# Patient Record
Sex: Female | Born: 1940
Health system: Southern US, Community
[De-identification: ages and names within clinical notes are randomized; demographics above are authoritative.]

## PROBLEM LIST (undated history)

## (undated) DIAGNOSIS — D649 Anemia, unspecified: Secondary | ICD-10-CM

## (undated) DIAGNOSIS — I5032 Chronic diastolic (congestive) heart failure: Secondary | ICD-10-CM

## (undated) DIAGNOSIS — R569 Unspecified convulsions: Secondary | ICD-10-CM

## (undated) DIAGNOSIS — E039 Hypothyroidism, unspecified: Secondary | ICD-10-CM

## (undated) DIAGNOSIS — E119 Type 2 diabetes mellitus without complications: Secondary | ICD-10-CM

## (undated) DIAGNOSIS — E876 Hypokalemia: Secondary | ICD-10-CM

## (undated) DIAGNOSIS — E7800 Pure hypercholesterolemia, unspecified: Secondary | ICD-10-CM

## (undated) DIAGNOSIS — I10 Essential (primary) hypertension: Secondary | ICD-10-CM

## (undated) DIAGNOSIS — E669 Obesity, unspecified: Secondary | ICD-10-CM

## (undated) DIAGNOSIS — K219 Gastro-esophageal reflux disease without esophagitis: Secondary | ICD-10-CM

## (undated) DIAGNOSIS — I209 Angina pectoris, unspecified: Secondary | ICD-10-CM

## (undated) DIAGNOSIS — I219 Acute myocardial infarction, unspecified: Secondary | ICD-10-CM

## (undated) DIAGNOSIS — I2510 Atherosclerotic heart disease of native coronary artery without angina pectoris: Secondary | ICD-10-CM

## (undated) DIAGNOSIS — M1990 Unspecified osteoarthritis, unspecified site: Secondary | ICD-10-CM

## (undated) DIAGNOSIS — D573 Sickle-cell trait: Secondary | ICD-10-CM

## (undated) DIAGNOSIS — I447 Left bundle-branch block, unspecified: Secondary | ICD-10-CM

## (undated) HISTORY — DX: Left bundle-branch block, unspecified: I44.7

## (undated) HISTORY — PX: CARDIAC CATHETERIZATION: SHX172

## (undated) HISTORY — DX: Gastro-esophageal reflux disease without esophagitis: K21.9

## (undated) HISTORY — PX: CATARACT EXTRACTION W/ INTRAOCULAR LENS  IMPLANT, BILATERAL: SHX1307

## (undated) HISTORY — PX: CHOLECYSTECTOMY: SHX55

## (undated) HISTORY — DX: Obesity, unspecified: E66.9

## (undated) HISTORY — DX: Hypothyroidism, unspecified: E03.9

## (undated) HISTORY — DX: Essential (primary) hypertension: I10

## (undated) HISTORY — DX: Atherosclerotic heart disease of native coronary artery without angina pectoris: I25.10

## (undated) HISTORY — PX: CORONARY ANGIOPLASTY WITH STENT PLACEMENT: SHX49

---

## 1967-09-28 HISTORY — PX: TUBAL LIGATION: SHX77

## 1991-07-29 HISTORY — PX: CORONARY ARTERY BYPASS GRAFT: SHX141

## 1998-03-05 ENCOUNTER — Ambulatory Visit (HOSPITAL_COMMUNITY): Admission: RE | Admit: 1998-03-05 | Discharge: 1998-03-05 | Payer: Self-pay | Admitting: Internal Medicine

## 1998-08-03 ENCOUNTER — Encounter: Payer: Self-pay | Admitting: Emergency Medicine

## 1998-08-03 ENCOUNTER — Inpatient Hospital Stay (HOSPITAL_COMMUNITY): Admission: EM | Admit: 1998-08-03 | Discharge: 1998-08-05 | Payer: Self-pay

## 1999-07-28 ENCOUNTER — Other Ambulatory Visit: Admission: RE | Admit: 1999-07-28 | Discharge: 1999-07-28 | Payer: Self-pay | Admitting: Internal Medicine

## 1999-08-28 ENCOUNTER — Inpatient Hospital Stay (HOSPITAL_COMMUNITY): Admission: EM | Admit: 1999-08-28 | Discharge: 1999-08-30 | Payer: Self-pay | Admitting: Emergency Medicine

## 1999-08-28 ENCOUNTER — Encounter: Payer: Self-pay | Admitting: Emergency Medicine

## 1999-08-29 ENCOUNTER — Encounter: Payer: Self-pay | Admitting: Rheumatology

## 1999-08-30 ENCOUNTER — Encounter: Payer: Self-pay | Admitting: Rheumatology

## 1999-11-24 ENCOUNTER — Encounter: Admission: RE | Admit: 1999-11-24 | Discharge: 1999-11-24 | Payer: Self-pay | Admitting: Internal Medicine

## 1999-11-24 ENCOUNTER — Encounter: Payer: Self-pay | Admitting: Internal Medicine

## 2000-01-21 ENCOUNTER — Encounter: Admission: RE | Admit: 2000-01-21 | Discharge: 2000-01-21 | Payer: Self-pay | Admitting: Internal Medicine

## 2000-01-21 ENCOUNTER — Encounter: Payer: Self-pay | Admitting: Internal Medicine

## 2000-04-04 ENCOUNTER — Encounter: Payer: Self-pay | Admitting: Cardiology

## 2000-04-04 ENCOUNTER — Inpatient Hospital Stay (HOSPITAL_COMMUNITY): Admission: AD | Admit: 2000-04-04 | Discharge: 2000-04-07 | Payer: Self-pay | Admitting: Cardiology

## 2000-08-15 ENCOUNTER — Other Ambulatory Visit: Admission: RE | Admit: 2000-08-15 | Discharge: 2000-08-15 | Payer: Self-pay | Admitting: Internal Medicine

## 2001-08-08 ENCOUNTER — Other Ambulatory Visit: Admission: RE | Admit: 2001-08-08 | Discharge: 2001-08-08 | Payer: Self-pay | Admitting: Internal Medicine

## 2001-08-16 ENCOUNTER — Encounter: Admission: RE | Admit: 2001-08-16 | Discharge: 2001-08-16 | Payer: Self-pay | Admitting: Internal Medicine

## 2001-08-16 ENCOUNTER — Encounter: Payer: Self-pay | Admitting: Internal Medicine

## 2001-09-11 ENCOUNTER — Encounter: Payer: Self-pay | Admitting: Ophthalmology

## 2001-09-14 ENCOUNTER — Ambulatory Visit (HOSPITAL_COMMUNITY): Admission: RE | Admit: 2001-09-14 | Discharge: 2001-09-15 | Payer: Self-pay | Admitting: Ophthalmology

## 2001-12-22 ENCOUNTER — Ambulatory Visit (HOSPITAL_COMMUNITY): Admission: RE | Admit: 2001-12-22 | Discharge: 2001-12-22 | Payer: Self-pay | Admitting: Ophthalmology

## 2001-12-24 ENCOUNTER — Emergency Department (HOSPITAL_COMMUNITY): Admission: EM | Admit: 2001-12-24 | Discharge: 2001-12-24 | Payer: Self-pay | Admitting: Emergency Medicine

## 2001-12-24 ENCOUNTER — Encounter: Payer: Self-pay | Admitting: Emergency Medicine

## 2001-12-24 ENCOUNTER — Encounter: Payer: Self-pay | Admitting: *Deleted

## 2002-02-02 ENCOUNTER — Encounter: Admission: RE | Admit: 2002-02-02 | Discharge: 2002-02-02 | Payer: Self-pay | Admitting: Internal Medicine

## 2002-02-02 ENCOUNTER — Encounter: Payer: Self-pay | Admitting: Internal Medicine

## 2002-07-05 ENCOUNTER — Inpatient Hospital Stay (HOSPITAL_COMMUNITY): Admission: EM | Admit: 2002-07-05 | Discharge: 2002-07-11 | Payer: Self-pay | Admitting: Emergency Medicine

## 2002-07-05 ENCOUNTER — Encounter: Payer: Self-pay | Admitting: *Deleted

## 2002-10-03 ENCOUNTER — Inpatient Hospital Stay (HOSPITAL_COMMUNITY): Admission: AD | Admit: 2002-10-03 | Discharge: 2002-10-05 | Payer: Self-pay | Admitting: Cardiology

## 2002-10-03 ENCOUNTER — Encounter: Payer: Self-pay | Admitting: Cardiology

## 2002-12-05 ENCOUNTER — Encounter: Payer: Self-pay | Admitting: Internal Medicine

## 2002-12-05 ENCOUNTER — Inpatient Hospital Stay (HOSPITAL_COMMUNITY): Admission: EM | Admit: 2002-12-05 | Discharge: 2002-12-09 | Payer: Self-pay | Admitting: Emergency Medicine

## 2003-05-21 ENCOUNTER — Emergency Department (HOSPITAL_COMMUNITY): Admission: EM | Admit: 2003-05-21 | Discharge: 2003-05-22 | Payer: Self-pay

## 2003-05-22 ENCOUNTER — Encounter: Payer: Self-pay | Admitting: Emergency Medicine

## 2004-08-13 ENCOUNTER — Ambulatory Visit: Payer: Self-pay | Admitting: Internal Medicine

## 2004-12-07 ENCOUNTER — Ambulatory Visit (HOSPITAL_COMMUNITY): Admission: RE | Admit: 2004-12-07 | Discharge: 2004-12-08 | Payer: Self-pay | Admitting: Ophthalmology

## 2005-02-14 ENCOUNTER — Emergency Department (HOSPITAL_COMMUNITY): Admission: EM | Admit: 2005-02-14 | Discharge: 2005-02-14 | Payer: Self-pay | Admitting: *Deleted

## 2005-02-16 ENCOUNTER — Encounter: Admission: RE | Admit: 2005-02-16 | Discharge: 2005-02-16 | Payer: Self-pay | Admitting: Internal Medicine

## 2005-03-01 ENCOUNTER — Other Ambulatory Visit: Admission: RE | Admit: 2005-03-01 | Discharge: 2005-03-01 | Payer: Self-pay | Admitting: Internal Medicine

## 2005-04-01 ENCOUNTER — Ambulatory Visit: Payer: Self-pay | Admitting: Cardiology

## 2005-04-05 ENCOUNTER — Ambulatory Visit (HOSPITAL_COMMUNITY): Admission: RE | Admit: 2005-04-05 | Discharge: 2005-04-05 | Payer: Self-pay | Admitting: Gastroenterology

## 2005-08-26 ENCOUNTER — Ambulatory Visit: Payer: Self-pay | Admitting: Cardiology

## 2005-09-06 ENCOUNTER — Ambulatory Visit: Payer: Self-pay | Admitting: Cardiology

## 2005-11-11 ENCOUNTER — Ambulatory Visit: Payer: Self-pay | Admitting: Cardiology

## 2005-11-29 ENCOUNTER — Encounter (HOSPITAL_BASED_OUTPATIENT_CLINIC_OR_DEPARTMENT_OTHER): Admission: RE | Admit: 2005-11-29 | Discharge: 2005-12-16 | Payer: Self-pay | Admitting: Surgery

## 2006-01-26 ENCOUNTER — Ambulatory Visit (HOSPITAL_COMMUNITY): Admission: RE | Admit: 2006-01-26 | Discharge: 2006-01-26 | Payer: Self-pay | Admitting: Ophthalmology

## 2006-02-17 ENCOUNTER — Encounter: Admission: RE | Admit: 2006-02-17 | Discharge: 2006-02-17 | Payer: Self-pay | Admitting: Internal Medicine

## 2006-02-18 ENCOUNTER — Ambulatory Visit: Payer: Self-pay | Admitting: Cardiology

## 2006-02-22 ENCOUNTER — Encounter: Admission: RE | Admit: 2006-02-22 | Discharge: 2006-03-23 | Payer: Self-pay | Admitting: Internal Medicine

## 2006-03-21 ENCOUNTER — Ambulatory Visit: Payer: Self-pay | Admitting: Cardiology

## 2006-03-24 ENCOUNTER — Encounter: Admission: RE | Admit: 2006-03-24 | Discharge: 2006-04-18 | Payer: Self-pay | Admitting: Internal Medicine

## 2006-03-28 ENCOUNTER — Ambulatory Visit: Payer: Self-pay | Admitting: Cardiology

## 2006-04-08 ENCOUNTER — Ambulatory Visit: Payer: Self-pay | Admitting: Cardiology

## 2006-04-22 ENCOUNTER — Ambulatory Visit: Payer: Self-pay | Admitting: Cardiology

## 2006-05-05 ENCOUNTER — Ambulatory Visit: Payer: Self-pay | Admitting: Cardiology

## 2006-05-18 ENCOUNTER — Ambulatory Visit: Payer: Self-pay | Admitting: Cardiology

## 2007-01-31 ENCOUNTER — Ambulatory Visit: Payer: Self-pay | Admitting: Cardiology

## 2007-03-14 ENCOUNTER — Ambulatory Visit: Payer: Self-pay | Admitting: Cardiology

## 2007-03-14 ENCOUNTER — Encounter: Payer: Self-pay | Admitting: Cardiology

## 2007-03-14 ENCOUNTER — Ambulatory Visit: Payer: Self-pay

## 2007-03-27 ENCOUNTER — Ambulatory Visit: Payer: Self-pay | Admitting: Cardiology

## 2007-03-30 ENCOUNTER — Ambulatory Visit: Payer: Self-pay | Admitting: Internal Medicine

## 2007-03-30 ENCOUNTER — Inpatient Hospital Stay (HOSPITAL_COMMUNITY): Admission: EM | Admit: 2007-03-30 | Discharge: 2007-04-01 | Payer: Self-pay | Admitting: Emergency Medicine

## 2007-04-26 ENCOUNTER — Ambulatory Visit: Payer: Self-pay | Admitting: Cardiology

## 2007-06-27 ENCOUNTER — Ambulatory Visit: Payer: Self-pay | Admitting: Cardiology

## 2007-08-17 ENCOUNTER — Inpatient Hospital Stay (HOSPITAL_COMMUNITY): Admission: EM | Admit: 2007-08-17 | Discharge: 2007-08-19 | Payer: Self-pay | Admitting: Emergency Medicine

## 2007-08-17 ENCOUNTER — Ambulatory Visit: Payer: Self-pay | Admitting: Internal Medicine

## 2007-08-22 ENCOUNTER — Ambulatory Visit: Payer: Self-pay | Admitting: Internal Medicine

## 2007-08-22 LAB — CONVERTED CEMR LAB
CO2: 26 meq/L (ref 19–32)
Chloride: 111 meq/L (ref 96–112)
Creatinine, Ser: 0.9 mg/dL (ref 0.4–1.2)
GFR calc Af Amer: 81 mL/min
Potassium: 4.2 meq/L (ref 3.5–5.1)
Sodium: 143 meq/L (ref 135–145)

## 2007-09-14 ENCOUNTER — Ambulatory Visit: Payer: Self-pay | Admitting: Cardiology

## 2007-09-14 LAB — CONVERTED CEMR LAB
Chloride: 109 meq/L (ref 96–112)
GFR calc Af Amer: 81 mL/min
Potassium: 4.2 meq/L (ref 3.5–5.1)

## 2007-10-03 ENCOUNTER — Ambulatory Visit: Payer: Self-pay | Admitting: Cardiology

## 2007-10-03 LAB — CONVERTED CEMR LAB
BUN: 5 mg/dL — ABNORMAL LOW (ref 6–23)
Chloride: 107 meq/L (ref 96–112)
Creatinine, Ser: 0.8 mg/dL (ref 0.4–1.2)

## 2007-10-25 ENCOUNTER — Ambulatory Visit: Payer: Self-pay | Admitting: Cardiology

## 2007-12-08 ENCOUNTER — Ambulatory Visit: Payer: Self-pay | Admitting: Cardiology

## 2007-12-08 LAB — CONVERTED CEMR LAB
BUN: 9 mg/dL (ref 6–23)
CO2: 27 meq/L (ref 19–32)
Calcium: 9.1 mg/dL (ref 8.4–10.5)
Chloride: 108 meq/L (ref 96–112)
Creatinine, Ser: 0.8 mg/dL (ref 0.4–1.2)
Glucose, Bld: 102 mg/dL — ABNORMAL HIGH (ref 70–99)
Potassium: 4.8 meq/L (ref 3.5–5.1)
Sodium: 141 meq/L (ref 135–145)
TSH: 1.08 microintl units/mL (ref 0.35–5.50)

## 2008-02-01 ENCOUNTER — Ambulatory Visit: Payer: Self-pay | Admitting: Cardiology

## 2008-02-01 LAB — CONVERTED CEMR LAB
BUN: 13 mg/dL (ref 6–23)
CO2: 28 meq/L (ref 19–32)
Calcium: 8.5 mg/dL (ref 8.4–10.5)
Creatinine, Ser: 0.9 mg/dL (ref 0.4–1.2)

## 2008-03-14 ENCOUNTER — Ambulatory Visit: Payer: Self-pay | Admitting: Cardiology

## 2008-03-14 LAB — CONVERTED CEMR LAB
BUN: 11 mg/dL (ref 6–23)
GFR calc Af Amer: 71 mL/min
Potassium: 4.3 meq/L (ref 3.5–5.1)
Sodium: 141 meq/L (ref 135–145)

## 2008-08-28 ENCOUNTER — Ambulatory Visit: Payer: Self-pay | Admitting: Cardiology

## 2008-08-28 LAB — CONVERTED CEMR LAB
CO2: 28 meq/L (ref 19–32)
Calcium: 8.3 mg/dL — ABNORMAL LOW (ref 8.4–10.5)
Chloride: 110 meq/L (ref 96–112)
Creatinine, Ser: 0.9 mg/dL (ref 0.4–1.2)
Glucose, Bld: 103 mg/dL — ABNORMAL HIGH (ref 70–99)

## 2008-09-11 ENCOUNTER — Ambulatory Visit: Payer: Self-pay | Admitting: Cardiology

## 2008-09-11 LAB — CONVERTED CEMR LAB
BUN: 9 mg/dL (ref 6–23)
CO2: 27 meq/L (ref 19–32)
GFR calc Af Amer: 71 mL/min
Glucose, Bld: 162 mg/dL — ABNORMAL HIGH (ref 70–99)
Potassium: 3.4 meq/L — ABNORMAL LOW (ref 3.5–5.1)
Sodium: 143 meq/L (ref 135–145)
TSH: 6.38 microintl units/mL — ABNORMAL HIGH (ref 0.35–5.50)

## 2009-01-13 ENCOUNTER — Encounter: Admission: RE | Admit: 2009-01-13 | Discharge: 2009-01-13 | Payer: Self-pay | Admitting: Internal Medicine

## 2009-03-20 ENCOUNTER — Ambulatory Visit: Payer: Self-pay | Admitting: Cardiology

## 2009-03-20 DIAGNOSIS — E7800 Pure hypercholesterolemia, unspecified: Secondary | ICD-10-CM

## 2009-03-20 DIAGNOSIS — I25810 Atherosclerosis of coronary artery bypass graft(s) without angina pectoris: Secondary | ICD-10-CM | POA: Insufficient documentation

## 2009-03-20 DIAGNOSIS — E039 Hypothyroidism, unspecified: Secondary | ICD-10-CM

## 2009-03-20 DIAGNOSIS — E876 Hypokalemia: Secondary | ICD-10-CM

## 2009-03-20 DIAGNOSIS — I10 Essential (primary) hypertension: Secondary | ICD-10-CM

## 2009-03-27 ENCOUNTER — Encounter: Admission: RE | Admit: 2009-03-27 | Discharge: 2009-05-23 | Payer: Self-pay | Admitting: Orthopaedic Surgery

## 2009-05-08 ENCOUNTER — Emergency Department (HOSPITAL_COMMUNITY): Admission: EM | Admit: 2009-05-08 | Discharge: 2009-05-08 | Payer: Self-pay | Admitting: Emergency Medicine

## 2009-08-28 ENCOUNTER — Other Ambulatory Visit: Admission: RE | Admit: 2009-08-28 | Discharge: 2009-08-28 | Payer: Self-pay | Admitting: Obstetrics and Gynecology

## 2009-09-04 ENCOUNTER — Encounter (INDEPENDENT_AMBULATORY_CARE_PROVIDER_SITE_OTHER): Payer: Self-pay | Admitting: *Deleted

## 2009-09-10 ENCOUNTER — Ambulatory Visit: Payer: Self-pay | Admitting: Cardiology

## 2010-03-02 ENCOUNTER — Ambulatory Visit: Payer: Self-pay | Admitting: Cardiology

## 2010-03-02 DIAGNOSIS — R011 Cardiac murmur, unspecified: Secondary | ICD-10-CM

## 2010-08-31 ENCOUNTER — Ambulatory Visit (HOSPITAL_COMMUNITY)
Admission: RE | Admit: 2010-08-31 | Discharge: 2010-08-31 | Payer: Self-pay | Source: Home / Self Care | Admitting: Cardiology

## 2010-08-31 ENCOUNTER — Ambulatory Visit: Payer: Self-pay | Admitting: Cardiology

## 2010-08-31 ENCOUNTER — Encounter: Payer: Self-pay | Admitting: Cardiology

## 2010-08-31 ENCOUNTER — Ambulatory Visit: Payer: Self-pay

## 2010-10-29 NOTE — Assessment & Plan Note (Signed)
Summary: f51m/need echo sameday/lwb   Visit Type:  6 months follow  Primary Provider:  Polite  CC:  One episode of chest pain since last visit. Relief with nitrog.Marland Kitchen  History of Present Illness: Patient is doing fine.  She had one episode of chest pain, this lasted about an hour, but she was pullingher husbancd since then.  Did use one NTG to the other night.  Has had no symptoms since, and very little before.   Anticoagulation Management History:      Positive risk factors for bleeding include an age of 70 years or older.  The bleeding index is 'intermediate risk'.  Positive CHADS2 values include History of HTN.  Negative CHADS2 values include Age > 70 years old.    Hypertension History:      Positive major cardiovascular risk factors include female age 70 years old or older, hyperlipidemia, and hypertension.        Positive history for target organ damage include ASHD (either angina/prior MI/prior CABG).      Problems Prior to Update: 1)  Murmur  (ICD-785.2) 2)  Unspecified Hypothyroidism  (ICD-244.9) 3)  Hypertension, Benign  (ICD-401.1) 4)  Hypercholesterolemia Iia  (ICD-272.0) 5)  Cad, Artery Bypass Graft  (ICD-414.04) 6)  Hypokalemia  (ICD-276.8)  Current Medications (verified): 1)  Cosopt 2-0.5 % Soln (Dorzolamide-Timolol) .... Two Times A Day 2)  Prednisolone Sodium Phosphate 1 % Soln (Prednisolone Sodium Phosphate) .... Both Eyes Two Times A Day 3)  Lipitor 80 Mg Tabs (Atorvastatin Calcium) .... Take One Tablet By Mouth Daily. 4)  Potassium Chloride Cr 10 Meq Cr-Caps (Potassium Chloride) .... Take One Tablet By Mouth Daily 5)  Dilantin 100 Mg Caps (Phenytoin Sodium Extended) .... Take Two Caps Two Times A Day 6)  Isosorbide Mononitrate Cr 60 Mg Xr24h-Tab (Isosorbide Mononitrate) .... Take Two Tablets Two Times A Day 7)  Prevacid Solutab 30 Mg Tbdp (Lansoprazole) .... Take 1 Tablet By Mouth Once A Day 8)  Carvedilol 6.25 Mg Tabs (Carvedilol) .... Take One Tablet By Mouth  Twice A Day 9)  Amlodipine Besylate 10 Mg Tabs (Amlodipine Besylate) .... Take One Tablet By Mouth Daily 10)  Synthroid 112 Mcg Tabs (Levothyroxine Sodium) .... Take 1 Tablet By Mouth Once A Day 11)  Aspirin 81 Mg Tbec (Aspirin) .... Take One Tablet By Mouth Daily 12)  Calcium Carbonate-Vitamin D 600-400 Mg-Unit  Tabs (Calcium Carbonate-Vitamin D) .... Take 1 Tablet By Mouth Once A Day 13)  Boniva 150 Mg Tabs (Ibandronate Sodium) .... Once A Month 14)  Nitroglycerin 0.4 Mg Subl (Nitroglycerin) .... One Tablet Under Tongue Every 5 Minutes As Needed For Chest Pain---May Repeat Times Three  Allergies: 1)  ! Demerol 2)  ! Morphine 3)  ! * Demerol Patch  Past History:  Past Medical History: Last updated: 11/30/08 CAD sp CABG 1992 History of PCI after CABG  Obesity Diabetes Seizure Disorder GERD Chronic LBBB Glaucoma Hypothyroidism Hypertension  Past Surgical History: Last updated: 2008-11-30 CABG 1992 Catarct removal Cholycystectomy Bilateral Tubal ligation  Family History: Last updated: 11-30-2008 Mother deceased diabetes Father deceases unknown  Social History: Last updated: 11/30/2008 Non smoker, married but lives alone in [place]  Vital Signs:  Patient profile:   70 year old female Height:      61 inches Weight:      212.50 pounds BMI:     40.30 Pulse rate:   61 / minute Pulse rhythm:   regular Resp:     18 per minute BP sitting:  128 / 72  (left arm) Cuff size:   large  Vitals Entered By: Vikki Ports (August 31, 2010 1:08 PM)  Physical Exam  General:  Well developed, well nourished, in no acute distress. Head:  normocephalic and atraumatic Eyes:  PERRLA/EOM intact; conjunctiva and lids normal. Lungs:  Clear bilaterally to auscultation and percussion. Heart:  Regular, paradoxical S2.   Pulses:  pulses normal in all 4 extremities Extremities:  No clubbing or cyanosis. Neurologic:  Alert and oriented x 3.   EKG  Procedure date:   08/31/2010  Findings:      NSR.  LBBB  Impression & Recommendations:  Problem # 1:  CAD, ARTERY BYPASS GRAFT (ICD-414.04) Had an episode of chest pain, lasted about an hour, but none since.   Continue medical therapy.  If any more epsidodes, she would likely need cath.  However, she has not had any recenlty despite the one episode.  Her updated medication list for this problem includes:    Isosorbide Mononitrate Cr 60 Mg Xr24h-tab (Isosorbide mononitrate) .Marland Kitchen... Take two tablets two times a day    Carvedilol 6.25 Mg Tabs (Carvedilol) .Marland Kitchen... Take one tablet by mouth twice a day    Amlodipine Besylate 10 Mg Tabs (Amlodipine besylate) .Marland Kitchen... Take one tablet by mouth daily    Aspirin 81 Mg Tbec (Aspirin) .Marland Kitchen... Take one tablet by mouth daily    Nitroglycerin 0.4 Mg Subl (Nitroglycerin) ..... One tablet under tongue every 5 minutes as needed for chest pain---may repeat times three  Problem # 2:  HYPERTENSION, BENIGN (ICD-401.1) controlled at present time  Her updated medication list for this problem includes:    Carvedilol 6.25 Mg Tabs (Carvedilol) .Marland Kitchen... Take one tablet by mouth twice a day    Amlodipine Besylate 10 Mg Tabs (Amlodipine besylate) .Marland Kitchen... Take one tablet by mouth daily    Aspirin 81 Mg Tbec (Aspirin) .Marland Kitchen... Take one tablet by mouth daily  Problem # 3:  HYPERCHOLESTEROLEMIA  IIA (ICD-272.0) followed by Dr. Nehemiah Settle. Her updated medication list for this problem includes:    Lipitor 80 Mg Tabs (Atorvastatin calcium) .Marland Kitchen... Take one tablet by mouth daily.  Problem # 4:  HYPOKALEMIA (ICD-276.8) remains on supplement.  Has labs checked with Dr. Nehemiah Settle.   Anticoagulation Management Assessment/Plan:             Hypertension Assessment/Plan:      The patient's hypertensive risk group is category C: Target organ damage and/or diabetes.  Today's blood pressure is 128/72.    Patient Instructions: 1)  Your physician recommends that you schedule a follow-up appointment in: 6 months 2)  Your  physician recommends that you continue on your current medications as directed. Please refer to the Current Medication list given to you today.

## 2010-10-29 NOTE — Assessment & Plan Note (Signed)
Summary: f59m  Medications Added NITROGLYCERIN 0.4 MG SUBL (NITROGLYCERIN) One tablet under tongue every 5 minutes as needed for chest pain---may repeat times three VITAMIN D3 2000 UNIT CAPS (CHOLECALCIFEROL) Take 1 capsule by mouth once a day        Visit Type:  6 months follow up Primary Provider:  Polite  CC:  Sob when walking.  History of Present Illness: She was doing ok until she looked at the scales.  She is walking until it got hot.  Feels really good, and she is surprised that her weight is up about five pounds from the last visit. Dr. Nehemiah Settle has been following her K and Dilantin levels.  She is scheduled to see him in July.    Current Medications (verified): 1)  Cosopt 2-0.5 % Soln (Dorzolamide-Timolol) .... Two Times A Day 2)  Prednisolone Sodium Phosphate 1 % Soln (Prednisolone Sodium Phosphate) .... Both Eyes Two Times A Day 3)  Lipitor 80 Mg Tabs (Atorvastatin Calcium) .... Take One Tablet By Mouth Daily. 4)  Potassium Chloride Cr 10 Meq Cr-Caps (Potassium Chloride) .... Take One Tablet By Mouth Daily 5)  Dilantin 100 Mg Caps (Phenytoin Sodium Extended) .... Take Two Caps Two Times A Day 6)  Isosorbide Mononitrate Cr 60 Mg Xr24h-Tab (Isosorbide Mononitrate) .... Take Two Tablets Two Times A Day 7)  Prevacid Solutab 30 Mg Tbdp (Lansoprazole) .... Take 1 Tablet By Mouth Once A Day 8)  Carvedilol 6.25 Mg Tabs (Carvedilol) .... Take One Tablet By Mouth Twice A Day 9)  Amlodipine Besylate 10 Mg Tabs (Amlodipine Besylate) .... Take One Tablet By Mouth Daily 10)  Synthroid 112 Mcg Tabs (Levothyroxine Sodium) .... Take 1 Tablet By Mouth Once A Day 11)  Aspirin 81 Mg Tbec (Aspirin) .... Take One Tablet By Mouth Daily 12)  Calcium Carbonate-Vitamin D 600-400 Mg-Unit  Tabs (Calcium Carbonate-Vitamin D) .... Take 1 Tablet By Mouth Once A Day 13)  Boniva 150 Mg Tabs (Ibandronate Sodium) .... Once A Month 14)  Nitroglycerin 0.4 Mg Subl (Nitroglycerin) .... One Tablet Under Tongue Every  5 Minutes As Needed For Chest Pain---May Repeat Times Three 15)  Vitamin D3 2000 Unit Caps (Cholecalciferol) .... Take 1 Capsule By Mouth Once A Day  Allergies: 1)  ! Demerol 2)  ! Morphine 3)  ! * Demerol Patch  Past History:  Past Medical History: Last updated: 12-05-08 CAD sp CABG 1992 History of PCI after CABG  Obesity Diabetes Seizure Disorder GERD Chronic LBBB Glaucoma Hypothyroidism Hypertension  Past Surgical History: Last updated: 12/05/08 CABG 1992 Catarct removal Cholycystectomy Bilateral Tubal ligation  Family History: Last updated: 12/05/2008 Mother deceased diabetes Father deceases unknown  Social History: Last updated: 12/05/2008 Non smoker, married but lives alone in [place]  Vital Signs:  Patient profile:   70 year old female Height:      61 inches Weight:      218.25 pounds BMI:     41.39 Pulse rate:   58 / minute Pulse rhythm:   regular Resp:     18 per minute BP sitting:   114 / 68  (left arm) Cuff size:   large  Vitals Entered By: Vikki Ports (March 02, 2010 2:49 PM)  Physical Exam  General:  Well developed, well nourished, in no acute distress. Head:  normocephalic and atraumatic Eyes:  PERRLA/EOM intact; conjunctiva and lids normal. Lungs:  Clear bilaterally to auscultation and percussion. Heart:  PMI non displaced.  Normal S1. Paradoxical splitting of S2.  SEM 2/6  Pulses:  pulses normal in all 4 extremities Extremities:  No clubbing or cyanosis.  No edema. Neurologic:  Alert and oriented x 3.   EKG  Procedure date:  03/02/2010  Findings:      SB.  LBBB.    Impression & Recommendations:  Problem # 1:  CAD, ARTERY BYPASS GRAFT (ICD-414.04)  Stable at present.  Not having chest pain.  Nearly twenty years now since bypass.  Continues to be stable.  Continue medical therpay. Her updated medication list for this problem includes:    Isosorbide Mononitrate Cr 60 Mg Xr24h-tab (Isosorbide mononitrate) .Marland Kitchen... Take two  tablets two times a day    Carvedilol 6.25 Mg Tabs (Carvedilol) .Marland Kitchen... Take one tablet by mouth twice a day    Amlodipine Besylate 10 Mg Tabs (Amlodipine besylate) .Marland Kitchen... Take one tablet by mouth daily    Aspirin 81 Mg Tbec (Aspirin) .Marland Kitchen... Take one tablet by mouth daily    Nitroglycerin 0.4 Mg Subl (Nitroglycerin) ..... One tablet under tongue every 5 minutes as needed for chest pain---may repeat times three  Orders: EKG w/ Interpretation (93000)  Problem # 2:  HYPERCHOLESTEROLEMIA  IIA (ICD-272.0) Has been followed by Dr. Nehemiah Settle. Her updated medication list for this problem includes:    Lipitor 80 Mg Tabs (Atorvastatin calcium) .Marland Kitchen... Take one tablet by mouth daily.  Problem # 3:  HYPOKALEMIA (ICD-276.8) levels checked by Dr. Nehemiah Settle.  Problem # 4:  HYPERTENSION, BENIGN (ICD-401.1)  controlled.  Her updated medication list for this problem includes:    Carvedilol 6.25 Mg Tabs (Carvedilol) .Marland Kitchen... Take one tablet by mouth twice a day    Amlodipine Besylate 10 Mg Tabs (Amlodipine besylate) .Marland Kitchen... Take one tablet by mouth daily    Aspirin 81 Mg Tbec (Aspirin) .Marland Kitchen... Take one tablet by mouth daily  Orders: EKG w/ Interpretation (93000)  Problem # 5:  UNSPECIFIED HYPOTHYROIDISM (ICD-244.9) she has  been taking thyroid hormone appropriately.  Reminded of need  (see prior admission) Her updated medication list for this problem includes:    Synthroid 112 Mcg Tabs (Levothyroxine sodium) .Marland Kitchen... Take 1 tablet by mouth once a day  Problem # 6:  MURMUR (ICD-785.2)  Murmur suggests early aortic stenosis. Will get echo when she returns in six months.  Her updated medication list for this problem includes:    Isosorbide Mononitrate Cr 60 Mg Xr24h-tab (Isosorbide mononitrate) .Marland Kitchen... Take two tablets two times a day    Carvedilol 6.25 Mg Tabs (Carvedilol) .Marland Kitchen... Take one tablet by mouth twice a day    Nitroglycerin 0.4 Mg Subl (Nitroglycerin) ..... One tablet under tongue every 5 minutes as needed for  chest pain---may repeat times three  Orders: EKG w/ Interpretation (93000)  Patient Instructions: 1)  Your physician wants you to follow-up in:  6 MONTHS.  You will receive a reminder letter in the mail two months in advance. If you don't receive a letter, please call our office to schedule the follow-up appointment. 2)  Your physician has requested that you have an echocardiogram in 6 MONTHS.  Echocardiography is a painless test that uses sound waves to create images of your heart. It provides your doctor with information about the size and shape of your heart and how well your heart's chambers and valves are working.  This procedure takes approximately one hour. There are no restrictions for this procedure. 3)  Your physician recommends that you continue on your current medications as directed. Please refer to the Current Medication list given to you today.  Prescriptions: NITROGLYCERIN 0.4 MG SUBL (NITROGLYCERIN) One tablet under tongue every 5 minutes as needed for chest pain---may repeat times three  #25 x 2   Entered by:   Vikki Ports   Authorized by:   Ronaldo Miyamoto, MD, [employer]   Signed by:   Julieta Gutting, RN, BSN on 03/02/2010   Method used:   Print then Give to Patient   RxID:   [id number]

## 2010-12-18 ENCOUNTER — Emergency Department (HOSPITAL_COMMUNITY)
Admission: EM | Admit: 2010-12-18 | Discharge: 2010-12-19 | Disposition: A | Discharge status: Home / Self Care | Payer: Medicare Other | Attending: Emergency Medicine | Admitting: Emergency Medicine

## 2010-12-18 DIAGNOSIS — E039 Hypothyroidism, unspecified: Secondary | ICD-10-CM | POA: Insufficient documentation

## 2010-12-18 DIAGNOSIS — R569 Unspecified convulsions: Secondary | ICD-10-CM | POA: Insufficient documentation

## 2010-12-18 DIAGNOSIS — I10 Essential (primary) hypertension: Secondary | ICD-10-CM | POA: Insufficient documentation

## 2010-12-18 DIAGNOSIS — I252 Old myocardial infarction: Secondary | ICD-10-CM | POA: Insufficient documentation

## 2010-12-18 DIAGNOSIS — I509 Heart failure, unspecified: Secondary | ICD-10-CM | POA: Insufficient documentation

## 2010-12-18 DIAGNOSIS — R109 Unspecified abdominal pain: Secondary | ICD-10-CM | POA: Insufficient documentation

## 2010-12-18 DIAGNOSIS — E119 Type 2 diabetes mellitus without complications: Secondary | ICD-10-CM | POA: Insufficient documentation

## 2010-12-18 DIAGNOSIS — E785 Hyperlipidemia, unspecified: Secondary | ICD-10-CM | POA: Insufficient documentation

## 2010-12-18 DIAGNOSIS — I2510 Atherosclerotic heart disease of native coronary artery without angina pectoris: Secondary | ICD-10-CM | POA: Insufficient documentation

## 2010-12-18 DIAGNOSIS — R0602 Shortness of breath: Secondary | ICD-10-CM | POA: Insufficient documentation

## 2010-12-19 ENCOUNTER — Emergency Department (HOSPITAL_COMMUNITY): Payer: Medicare Other

## 2010-12-19 LAB — COMPREHENSIVE METABOLIC PANEL WITH GFR
ALT: 19 U/L (ref 0–35)
Albumin: 3.8 g/dL (ref 3.5–5.2)
Alkaline Phosphatase: 62 U/L (ref 39–117)
Chloride: 104 meq/L (ref 96–112)
GFR calc non Af Amer: >60 mL/min (ref >60)
Potassium: 4.3 meq/L (ref 3.5–5.1)
Sodium: 139 meq/L (ref 135–145)
Total Bilirubin: 0.5 mg/dL (ref 0.3–1.2)

## 2010-12-19 LAB — DIFFERENTIAL
Basophils Absolute: 0.1 10*3/uL (ref 0.0–0.1)
Basophils Relative: 1 % (ref 0–1)
Eosinophils Absolute: 0.3 K/uL (ref 0.0–0.7)
Eosinophils Relative: 3 % (ref 0–5)
Lymphocytes Relative: 31 % (ref 12–46)
Lymphs Abs: 2.5 K/uL (ref 0.7–4.0)
Monocytes Absolute: 0.7 K/uL (ref 0.1–1.0)
Monocytes Relative: 9 % (ref 3–12)
Neutro Abs: 4.6 K/uL (ref 1.7–7.7)
Neutrophils Relative %: 56 % (ref 43–77)

## 2010-12-19 LAB — LIPASE, BLOOD: Lipase: 26 U/L (ref 11–59)

## 2010-12-19 LAB — COMPREHENSIVE METABOLIC PANEL
AST: 21 U/L (ref 0–37)
BUN: 12 mg/dL (ref 6–23)
CO2: 28 mEq/L (ref 19–32)
Calcium: 9 mg/dL (ref 8.4–10.5)
Creatinine, Ser: 0.9 mg/dL (ref 0.4–1.2)
GFR calc Af Amer: >60 mL/min (ref >60)
Glucose, Bld: 102 mg/dL — ABNORMAL HIGH (ref 70–99)
Total Protein: 7.5 g/dL (ref 6.0–8.3)

## 2010-12-19 LAB — URINALYSIS, ROUTINE W REFLEX MICROSCOPIC
Bilirubin Urine: NEGATIVE
Glucose, UA: NEGATIVE mg/dL
Hgb urine dipstick: NEGATIVE
Ketones, ur: NEGATIVE mg/dL
Nitrite: NEGATIVE
Protein, ur: NEGATIVE mg/dL
Specific Gravity, Urine: 1.012 (ref 1.005–1.030)
Urobilinogen, UA: 0.2 mg/dL (ref 0.0–1.0)
pH: 6 (ref 5.0–8.0)

## 2010-12-19 LAB — CBC
HCT: 34.5 % — ABNORMAL LOW (ref 36.0–46.0)
Hemoglobin: 11.6 g/dL — ABNORMAL LOW (ref 12.0–15.0)
MCH: 31.2 pg (ref 26.0–34.0)
MCHC: 33.6 g/dL (ref 30.0–36.0)
MCV: 92.7 fL (ref 78.0–100.0)
Platelets: 191 K/uL (ref 150–400)
RBC: 3.72 MIL/uL — ABNORMAL LOW (ref 3.87–5.11)
RDW: 12.6 % (ref 11.5–15.5)
WBC: 8.1 10*3/uL (ref 4.0–10.5)

## 2010-12-19 LAB — D-DIMER, QUANTITATIVE: D-Dimer, Quant: 0.26 ug/mL-FEU (ref 0.00–0.48)

## 2011-01-25 ENCOUNTER — Other Ambulatory Visit: Payer: Self-pay | Admitting: Cardiology

## 2011-02-09 NOTE — Discharge Summary (Signed)
Christine Gay, Christine Gay              ACCOUNT NO.:  [id number]   MEDICAL RECORD NO.:  [id number]          PATIENT TYPE:  INP   LOCATION:  2928                         FACILITY:  [facility]   PHYSICIAN:  Bevelyn Buckles. Bensimhon, MDDATE OF BIRTH:  1941-05-31   DATE OF ADMISSION:  08/17/2007  DATE OF DISCHARGE:  08/19/2007                               DISCHARGE SUMMARY   PRIMARY CARE PHYSICIAN:  Dr. Trula Slade.   CARDIOLOGIST:  Dr. Arturo Morton. Stuckey.   DISCHARGE DIAGNOSES:  1. Chest pain.  2. Hypokalemia.  3. Hypertension.   SECONDARY DIAGNOSES:  1. Coronary artery disease status post bypass surgery 1992.  2. Obesity.  3. Diabetes.  4. History of seizure disorder.  5. Hyperlipidemia.  6. Gastroesophageal reflux disease.  7. Chronic left bundle branch block.  8. Glaucoma.   HOSPITAL COURSE:  Christine Gay is a delightful 70 year old woman with a  history of coronary artery disease, hypertension, diabetes and  hyperlipidemia who is status post bypass surgery in 1992.  She is  followed by Dr. Riley Kill.  She was admitted in July 2008, for chest pain.  Cardiac catheterization was performed.  There was no evidence of a high-  grade lesion so she was discharged home with medical therapy.  She  represented to the emergency room with recurrent chest pain with both  typical and atypical features.  There was some response to  nitroglycerin.  Cardiac enzymes were negative.  EKG showed chronic left  bundle branch block.  Given her symptoms, she was taken back to the  cardiac catheterization lab for repeat angiography.  Angiography showed  some left dominant system with significant native coronary artery  disease.  The left main was okay.  The LIMA was totally occluded in the  midsection.  The circumflex was a dominant vessel.  There was a large OM-  1 which was totally occluded in the midsection, a small OM-2 which was  occluded, several posterolateral.  In the proximal to mid circumflex  there was a  70% lesion after the takeoff of the large marginal.  This  was around a very tortuous bend.  There was no change in his from  previous.  Right coronary was totally occluded, it was likely a  nondominant vessel.  There was bridging a right-to-right collaterals  filling the distal vessel.  The saphenous vein graft to large OM-1 was  patent.  The saphenous vein graft to the OM-2 was chronically occluded,  LIMA to the LAD was patent.  EF was 55%.  Her LVEDP was 24 with an LV  pressure 177/12.  After catheterization, her right groin site was  AngioSealed and that was good hemostasis.  The day after  catheterization, she was stable and ambulating without difficulty.   I reviewed her catheterization films from this admission and July  admission closely.  There is no significant change in the proximal to  mid left circumflex lesion.  This does feed an extensive territory but  did not appear flow-limiting.  Additionally it is a very tortuous area  and would be very difficult to approach percutaneously.  However, if  she  continues to have chest pain,  one could consider possible Myoview to  assess for ischemia and his territory.  Could also consider cardiac  rehab.  I will review this with Dr. Riley Kill.   Of note, her hospitalization was complicated by significant hypokalemia  and required multiple doses of both oral and IV potassium  supplementation.  Initial potassium was 2.7 on admission, on discharge  it was 3.7.  Plasma renin/aldosterone ratio was not checked, but could  be reasonable to do as an outpatient.   In the hospital, her blood pressure was also significantly elevated with  elevated EDP on catheterization.  Her metoprolol was switched to Coreg  and spironolactone was added for help with both her potassium and blood  pressure.   DISCHARGE LABORATORY DATA:  Sodium 144, potassium 3.7, BUN 3, creatinine  0.84.  Hemoglobin was 12, hematocrit was 36%.   CURRENT MEDICATIONS ON  DISCHARGE:  1. Imdur 60 mg b.i.d.  2. Coreg 6.25 b.i.d.  3. Lipitor 40 a day.  4. Aspirin 81 a day.  5. Synthroid 125 mcg a day.  6. Prevacid 30 mg a day.  7. Dilantin 100 mg twice a day.  8. Altace 5 mg a day.  9. Norvasc 10 a day.  10.Dilantin 100 mg twice a day.  11.Cosopt eye drops.  12.Spironolactone 25 mg a day which is new.  13.Potassium chloride 20 mg two times a day.  14.She will stop her Lopressor.   FOLLOW-UP AFTER DISCHARGE:  1. Dr. Riley Kill in 1-2 weeks.  She will be contacted with an      appointment.  2. She will follow up with Dr. Nehemiah Settle, her primary care doctor.  This      will be as scheduled.  3. She will need a BMET on Tuesday, August 22, 2007.      Bevelyn Buckles. Bensimhon, MD  Electronically Signed     DRB/MEDQ  D:  08/19/2007  T:  08/20/2007  Job:  [id number]   cc:   Christine Gay, M.D.  Arturo Morton. Riley Kill, MD, [facility]

## 2011-02-09 NOTE — Assessment & Plan Note (Signed)
[facility]                            CARDIOLOGY OFFICE NOTE   [name], Gay                     MRN:          [id number]  DATE:03/14/2007                            DOB:          [birth year]    Ms. Christine Gay is in for a follow-up visit.  She has noticed that when she  goes out after she eats, she gets some discomfort in the chest.  She  also gets a little discomfort that sometimes wakes her up at night, but  she burps and belches, and it tends to go away.  She has been on chronic  Prevacid therapy.  That has not been discontinued or stopped recently.  She notes that the biggest problem is when she exerts herself after she  eats a heavy meal.   PHYSICAL EXAMINATION:  VITAL SIGNS:  Blood pressure 155/89, pulse 62.  LUNGS:  The lung fields are clear.  CARDIAC:  Unchanged.  EXTREMITIES:  There is no extremity edema.   An echocardiogram was done.  We only have the preliminary.  There was  not evidence of significant left ventricular regional dysfunction.   IMPRESSION:  1. Recent discomfort with some typical and atypical features.  2. Underlying coronary artery disease with prior bypass surgery and      extensive distal disease.  3. Hypercholesterolemia, on lipid-lowering management.  4. Moderate obesity.   PLAN:  I talked to her today about having a heart catheterization.  She  wanted to defer at this point.  As such, I am going to see her back in  followup in a couple of weeks.  I am concerned about the nature of her  symptoms.  It sounds more like angina, and she is on chronic PPI  therapy, despite the belching noted.  She will continue to monitor these  closely, and she has promised that she will call if there is any  progression in her symptoms.     Arturo Morton. Riley Kill, MD, [facility]  Electronically Signed    TDS/MedQ  DD: 03/14/2007  DT: 03/14/2007  Job #: [id number]

## 2011-02-09 NOTE — Assessment & Plan Note (Signed)
[facility]                            CARDIOLOGY OFFICE NOTE   Christine Gay, Christine Gay                     MRN:          [id number]  DATE:10/25/2007                            DOB:          1941-09-14    Christine Gay is in for a followup visit.  In general, she has been doing  reasonably well.  She is eating about two meals a day.  She denies any  chest pain.  Of interest, her most recent potassium was 3.2 off of all  drugs.  She does not take any diuretics.  Denies any diarrhea or  anything to suggest villous adenoma.   PHYSICAL EXAMINATION:  VITAL SIGNS:  Blood pressure is 142/80, pulse is  64.  LUNGS:  The lung fields are clear.  CARDIAC:  Rhythm is regular.  There is paradoxical splitting of the  second heart sound compatible with her left bundle branch block.   IMPRESSION:  1. Coronary artery disease, status post coronary bypass graft surgery      and percutaneous intervention.  2. Hypercholesterolemia on lipid lowering therapy.  3. Hypothyroidism with recent low TSH with slightly reduced Synthroid      dose.  4. Hypokalemia, this remains unexplained.   PLAN:  1. She will stay on potassium 20 mEq daily.  2. Will get a plasma run on activity and serum aldosterone level.  In      addition, we will get 24-hour urinary aldose.  Finally, we will get      a basic metabolic profile.  I will see her back in followup in 6      weeks.     Arturo Morton. Riley Kill, MD, [facility]  Electronically Signed    TDS/MedQ  DD: 10/25/2007  DT: 10/26/2007  Job #: [id number]

## 2011-02-09 NOTE — Assessment & Plan Note (Signed)
[facility]                            CARDIOLOGY OFFICE NOTE   CHELCEA, ZAHN                     MRN:          [id number]  DATE:03/14/2008                            DOB:          [birth year]    Cele is in for followup.  She really is doing quite well.  I have not  seen her in about 3 months.  She does have some shortness of breath with  exertion, but with her weight overall, which she has gained 7 pounds;  this has contributed.   Today on examination, the blood pressure was 122/76, pulse 54.  Lung  fields are clear.  There is some paradoxical splitting of the second  heart sound.  Extremities reveal no edema.   The echocardiogram demonstrates sinus bradycardia with left bundle-  branch block, otherwise unremarkable.   IMPRESSION:  1. Coronary artery, disease status post coronary artery bypass graft      surgery.  2. Hypercholesterolemia, on lipid-lowering therapy.  3. History of mild hypokalemia on no diuretic therapy on low-dose      potassium replacement with low Aldo/PRA ratio.  4. Hypothyroidism, on thyroid replacement therapy with a prior history      of stopping thyroid associated with bradycardia.  5. History of seizure disorder, on Dilantin.  6. Hypertension, controlled.   PLAN:  1. Return to clinic in 4 months.  2. Basic metabolic profile.     Arturo Morton. Riley Kill, MD, [facility]  Electronically Signed    TDS/MedQ  DD: 03/14/2008  DT: 03/15/2008  Job #: [id number]

## 2011-02-09 NOTE — Assessment & Plan Note (Signed)
[facility]                            CARDIOLOGY OFFICE NOTE   AIDA, LEMAIRE                     MRN:          [id number]  DATE:09/14/2007                            DOB:          12-Apr-1941    Shewanda is in for followup visit.  In general, she is stable, however,  she feels somewhat weak and more fatigued since her recent discharge  from the hospital.   Today the weight is 212 pounds.  Blood pressure 100/64 and pulse is 46.  The lung fields are clear.  The cardiac rhythm is regular with paradoxical splitting of the second  heart sound.   The EKG reveals marked sinus bradycardia with left bundle branch block.   We will get a basic metabolic profile today, I will have Merleen stop  her spironolactone and Altace.  We will also check a potassium and she  is going to stop her potassium.  We will see her back in followup in  about 2 weeks at which time we will recheck her laboratory studies.  If  she has problems in the interim, she is to call me.     Arturo Morton. Riley Kill, MD, [facility]  Electronically Signed    TDS/MedQ  DD: 10/03/2007  DT: 10/03/2007  Job #: [id number]

## 2011-02-09 NOTE — Letter (Signed)
August 28, 2008    Christine Gay. Polite, MD  [address]  [place], [place] [address]   RE:  Christine Gay, Christine Gay  MRN:  [id number]  /  DOB:  April 30, 1941   Dear Christine Gay,   I had the pleasure seeing Christine Gay in the office today in  followup.  She is a lady we mutually share.  I have been following her  since she was first followed by Mosetta Anis many years in the past.  Overall, she seems to be getting along pretty reasonably well.  She gets  a little bit of occasional angina at times when she is active.  She does  tend to run out of her medications, and has not had any for the past 3  days.  She has continued Coreg but stopped her Synthroid and her  Dilantin I encouraged her.  She is called to have her medicines renewed,  she just does not pick them up.  I suspected part due to finances.   Currently, she is alert and oriented in no distress.  The blood pressure  is 134/80, the pulse is 54.  The lung fields are clear.  The cardiac  rhythm is regular.  There is paradoxical splitting of the second heart  sound.   EKG reveals sinus bradycardia.  There is a left bundle branch block.   In summary, this lady is stable.  I plan to see her back in followup  probably in about 6 months.  We will get a TSH, which should not be  affected to great degree and as she has only stopped her Synthroid for 3  days.  We will also get a basic metabolic profile as she has had a  problem in  the past of hypokalemia, even off of diuretics.  We will send the lab  studies for your review.  When you see her back after the first in the  year, a lipid profile will be helpful.   Thanks for allowing me to share in her care.    Sincerely,      Arturo Morton. Riley Kill, MD, [facility]  Electronically Signed    TDS/MedQ  DD: 08/28/2008  DT: 08/29/2008  Job #: [id number]

## 2011-02-09 NOTE — Cardiovascular Report (Signed)
NAMEJAKARI, Christine Gay              ACCOUNT NO.:  [id number]   MEDICAL RECORD NO.:  [id number]          PATIENT TYPE:  INP   LOCATION:  2928                         FACILITY:  [facility]   PHYSICIAN:  Bevelyn Buckles. Bensimhon, MDDATE OF BIRTH:  01-Mar-1941   DATE OF PROCEDURE:  08/18/2007  DATE OF DISCHARGE:  08/19/2007                            CARDIAC CATHETERIZATION   PRIMARY CARDIOLOGIST:  Dr. Bonnee Quin.   PATIENT IDENTIFICATION:  Christine Gay is a 70 year old woman with previous  bypass surgery followed by Dr. Riley Kill.  She underwent cardiac  catheterization for chest pain July 2008 which showed severe three-  vessel coronary artery disease with a patent LIMA to the LAD, a patent  saphenous vein graft to the OM-1 and a totally occluded saphenous vein  graft to the OM-2.  The right coronary artery was nondominant and  totally occluded.  There was a 70% lesion in the proximal to mid  circumflex which was untreated.  She was readmitted with chest pain.  Cardiac markers and EKG were normal.  She was brought to the  catheterization lab for diagnostic angiography.   PROCEDURES PERFORMED:  1. Selective coronary angiography.  2. Saphenous vein graft angiography x2.  3. LIMA angiography.  4. Left heart cath.  5. Left ventriculogram.  6. Angio-Seal femoral closure.   DESCRIPTION OF PROCEDURE:  The risks and indications of the procedure  were explained to Christine Gay.  Consent was signed and placed on the  chart.  A 5-French arterial sheath was placed in right femoral artery  using a modified Seldinger technique.  A JL-4 catheter was used to image  the left coronary system.  A JR-4 was used to image the right coronary  system as well as the saphenous vein graft to the OM-1.  IM catheter was  used to image the LIMA as well as the saphenous vein graft to the OM-2.  An angled pigtail catheter was used for left heart catheterization and  apparent hand injection was performed for left  ventriculogram.   Angiography of the patient's right groin arteriotomy site was sealed  with an Angio-Seal closure device with good hemostasis.   Central aortic pressure 160/74 with a mean of 106.  LV pressure 177/12  with an EDP of 24.  There was no aortic stenosis.   Left main was normal.   LAD was heavily diseased proximally with a 99% lesion and totally  occluded in the midsection after two septal perforators.   Left circumflex was large dominant vessel that gave off a large OM1  which was occluded in the midsection.  Vessel was very tortuous  proximally.  It gave off several posterolaterals and a PDA.  In the  proximal to midportion just after the takeoff of the large marginal  branch there was 70% lesion which was stable from previous.   Right coronary artery was a small nondominant vessel that was totally  occluded proximally.  There was an RV branch which provided faint  collaterals to the distal right coronary artery which was very tiny.   Saphenous vein graft to the OM-1 was widely patent.  There was a 30%  lesion in the distal part of the graft which is unchanged from previous.   Saphenous vein graft to the OM-2 was totally occluded in the proximal  stent.  The ostial stent was unchanged from previous.   LIMA to the LAD was widely patent.   Left ventriculogram done by hand injection did not opacify the ventricle  well.  Ejection fraction appeared to be about 55%.  There appeared to be  some mild inferior basilar hypokinesis.   ASSESSMENT:  1. Three-vessel coronary artery disease in the left dominant system.  2. LIMA to the LAD is patent.  3. Saphenous vein graft to the OM1 is patent.  4. Saphenous vein graft to the OM-2 is chronically totally occluded.  5. There is a 70% lesion in the proximal to mid native left circumflex      which was unchanged from previous.  6. Normal LV function with elevated end-diastolic pressures and      significant hypertension    PLAN/DISCUSSION:  Christine Gay has stable coronary anatomy since July  2008.  At this point, I think it is unlikely that her chest pain is  ischemic.  However, if it does persist can consider outpatient Myoview  to assess the significance of the left circumflex lesion, though this  would be very difficult to approach percutaneously as the vessel was  very tortuous.  I will review with Dr. Riley Kill.  She should be suitable  for discharge home in the a.m.      Bevelyn Buckles. Bensimhon, MD  Electronically Signed     DRB/MEDQ  D:  08/18/2007  T:  08/19/2007  Job:  [id number]

## 2011-02-09 NOTE — Assessment & Plan Note (Signed)
[place] [facility]                            CARDIOLOGY OFFICE NOTE   KYESHA, BALLA                     MRN:          [id number]  DATE:06/27/2007                            DOB:          [birth year]    SUBJECTIVE:  Christine Gay is here for followup.  In general she is stable.  She is doing reasonably well.  She apparently had laboratory work done  over at Dr. Deirdre Peer. Polite's office.  The patient was noted to be  hypothyroid, with some heart block in the summertime.  After careful  questioning, it was apparent that she really was not taking her  Synthroid much of the time.  We have been able to get her back on her  medication and she has stabilized.  She denies chest pain or shortness  of breath.   PHYSICAL EXAMINATION:  VITAL SIGNS:  Blood pressure 130/80, pulse 49.  LUNGS:  Fields are clear.  HEART:  There is paradoxical splitting of the second heart sound.  EXTREMITIES:  There is no extremity edema.   The electrocardiogram demonstrates sinus rhythm with a left bundle  branch block   We are not going to do laboratory work today.  We are going to try to  get the results from Dr. Idelle Crouch office.  She says her cholesterol is  normal, which means likely that her thyroid tests have stabilized.  Hopefully she had a TSH done at that time.   FOLLOWUP:  We will see her back in continuing followup in three months.     Arturo Morton. Riley Kill, MD, [facility]  Electronically Signed    TDS/MedQ  DD: 06/27/2007  DT: 06/27/2007  Job #: [id number]   cc:   Deirdre Peer. Polite, M.D.

## 2011-02-09 NOTE — Assessment & Plan Note (Signed)
[facility]                            CARDIOLOGY OFFICE NOTE   DIANAH, PRUETT                     MRN:          [id number]  DATE:04/26/2007                            DOB:          [birth year]    CARDIOLOGIST:  Dr. Shawnie Pons.   PRIMARY CARE PHYSICIAN:  Dr. Renford Dills.   HISTORY OF PRESENT ILLNESS:  Ms. Kuklinski is a 70 year old female patient  with a history of coronary artery disease, status post CABG in 1992, and  status post multiple percutaneous coronary interventions, who recently  presented to [facility] with symptoms consistent with unstable  angina pectoris. She ruled out for myocardial infarction by enzymes. She  was set up for cardiac catheterization by Dr. Riley Kill. This revealed a  preserved ejection fraction of 50% to 55% with inferobasal hypokinesis,  patent LIMA to the LAD, patent vein graft to the obtuse marginal, patent  stent in the distal vessel, occluded vein graft to the other obtuse  marginal with a previously placed stent proximally, and severely  diseased and occluded RCA. Medical therapy was recommended. She  apparently had been off of her medications recently secondary to  financial concerns. She also had her beta blocker dose decreased  secondary to relative bradycardia. She returns to the office today for  follow up.   She denies any recurrent chest pain, shortness of breath. Denies any  syncope or near syncope. She denies any groin pain, fevers, or chills.   CURRENT MEDICATIONS:  1. Cosopt eye drops.  2. Acular.  3. Prednisolone eye drops.  4. Metoprolol 50 mg quarter-tablet b.i.d.  5. Isosorbide 60 mg b.i.d.  6. Lipitor 40 mg daily.  7. Dilantin 100 mg 2 tablets b.i.d.  8. Norvasc 10 mg daily.  9. Aspirin 81 mg daily.  10.Synthroid 125 mcg daily.  11.Prevacid 30 mg daily.  12.Altace 5 mg daily.  13.Multivitamin.  14.Lipitor 40 mg daily.  15.Hydrocodone p.r.n.  16.Nitroglycerine  p.r.n.   ALLERGIES:  NITRO PATCH.   PHYSICAL EXAMINATION:  She is a well-nourished, well-developed female in  no distress. Blood pressure is 147/81, pulse 60, weight 212 pounds.  HEENT: Normal.  NECK: Without JVD.  CARDIAC: S1, S2. Regular rate and rhythm.  LUNGS: Clear to auscultation bilaterally.  ABDOMEN: Soft, nontender.  EXTREMITIES: Without edema. Right femoral arteriotomy site without  hematoma or bruits.   IMPRESSION:  1. Coronary artery disease.      a.     Status post coronary artery bypass grafting in 1992.      b.     Status post multiple percutaneous coronary interventions in       the past.      c.     Stable anatomy by recent cardiac catheterization as outlined       above- medical therapy.  2. Preserved left ventricular function.  3. Hypothyroidism.      a.     Recent elevated TSH during hospitalization- medication       restarted.      b.     Follow up with primary care physician.  4.  Diabetes mellitus.  5. Hypertension.  6. Hyperlipidemia.  7. Gastroesophageal reflux disease.  8. Obesity.  9. Glaucoma.  10.Seizure disorder on dilantin therapy.  11.Questionable history of sleep apnea.   PLAN:  The patient presents to the office today for follow up post  catheterization. She is doing well from a cardiovascular standpoint  without recurrent chest pain or shortness of breath. Her blood pressure  is mildly elevated. I have recommended she decrease her salt intake. She  will see Dr. Riley Kill back in about 6 weeks time. If she continues to  have elevated blood pressures over 140/90 we could consider addition of  low dose thiazide diuretic (12.5 mg a day).      Tereso Newcomer, PA-C  Electronically Signed      Luis Abed, MD, [facility]  Electronically Signed   SW/MedQ  DD: 04/26/2007  DT: 04/27/2007  Job #: [id number]   cc:   Deirdre Peer. Polite, M.D.

## 2011-02-09 NOTE — H&P (Signed)
Christine Gay, Christine Gay              ACCOUNT NO.:  [id number]   MEDICAL RECORD NO.:  [id number]          PATIENT TYPE:  EMS   LOCATION:  MAJO                         FACILITY:  [facility]   PHYSICIAN:  Bevelyn Buckles. Bensimhon, MDDATE OF BIRTH:  07-Feb-1941   DATE OF ADMISSION:  03/29/2007  DATE OF DISCHARGE:                              HISTORY & PHYSICAL   PRIMARY CARE PHYSICIAN:  Renford Dills, M.D.   CARDIOLOGIST:  Shawnie Pons, M.D.   REASON FOR ADMISSION:  Unstable angina.   HISTORY OF PRESENT ILLNESS:  The patient is a delightful 70 year old  woman with a history of diabetes, hypertension, hyperlipidemia, and  coronary artery disease.  She is status post coronary artery bypass  graft in 1992 and has had several subsequent stents.  She is followed  closely by Dr. Bonnee Quin.  She has a history of chronic stable angina.  She recently saw Dr. Riley Kill on 03/27/07 and was doing quite well.  She  had a recent echocardiogram showing an EF of 60%, however, tonight she  was coming home from bible study and was walking a little bit more  quickly than usual and had significant chest tightness radiating down  her left arm, associated with shortness of breath  This was worse than  usual.  She called EMS who brought her to the emergency room.  She says  her discomfort is much better but not totally relieved.   REVIEW OF SYSTEMS:  On review of systems she does say that she snores  heavily, otherwise all systems are negative.  She denies fevers, chills,  nausea, vomiting, bright red blood per rectum, melena, focal  neurological symptoms, palpitations, syncope, or presyncope.   PAST MEDICAL HISTORY:  1. Coronary artery disease:      a.     Status post coronary artery bypass graft in 1992.      b.     Status post multiple stents.      c.     Most recent catheterization was in 01/04 and this showed an       EF of 49%, left main was normal, LAD was totally occluded in the       mid section,  circumflex provided a first marginal branch and then       was totally occluded, right coronary was non-dominant, saphenous       vein graft to the first marginal was totally occluded, saphenous       vein graft to the OM2 is patent, the LIMA to the LAD was patent.  2. Diabetes.  3. Hypertension.  4. Hyperlipidemia.  5. Gastroesophageal reflux disease.  6. Obesity.  7. Glaucoma.  8. Seizure disorder.  9. Hypothyroidism.  10.Possible sleep apnea.   CURRENT MEDICATIONS:  1. Aspirin 81 mg a day.  2. Imdur 120 mg b.i.d.  3. Dilantin 200 mg b.i.d.  4. Synthroid 125 micrograms a day.  5. Metoprolol 25 mg b.i.d.  6. Altace 5 mg a day.  7. Norvasc 10 mg a day.  8. Prevacid 30 mg a day.  9. Lipitor 40 mg a day.  10.Vitamin.  11.Actonel once a week.  12.Cosopt eye drops two times daily in both eyes.  13.Acular drops 0.4% two times daily in both eyes.  14.Prednisolone one time daily in the right eye only.   ALLERGIES:  Allergies are to Demerol and morphine.   SOCIAL HISTORY:  She is married but does not live in the same house as  her husband.  She denies tobacco, alcohol, or drug use.   FAMILY HISTORY:  The mother died at age 56 due to a diabetic coma.  The  father died at age 12 of unknown causes.   PHYSICAL EXAMINATION:  GENERAL:  She is lying flat in bed in no acute  distress.  VITAL SIGNS:  Respirations are unlabored.  Blood pressure is 118/60,  heart rate is 45, saturations are 99% on two liters.  HEENT:  Normal.  NECK:  The neck is supple, no jugular venous distention appreciated  though her neck is thick and it is hard to evaluate clearly.  The  carotids are 2+ bilaterally without bruits.  There is no lymphadenopathy  or thyromegaly.  CARDIAC:  PMI is not palpable.  She is bradycardic and regular with  occasional ectopy, no murmurs, rubs, or gallops.  LUNGS:  The lungs are clear.  ABDOMEN:  The abdomen is obese, nontender, nondistended, no  hepatosplenomegaly, no  bruits, no masses appreciated, good bowel sounds.  EXTREMITIES:  The extremities are warm with no cyanosis or clubbing.  There is trace edema, no rash.  NEUROLOGICAL:  Alert and oriented times three.  Cranial nerves II  through XII are intact.  The patient moves all four extremities without  difficulty.  Affect is pleasant.   LABORATORY STUDIES:  The white count is 8.0, hemoglobin is 11, platelets  are 212.  Sodium of 136, potassium of 3.1.  BUN is 14, creatinine of  1.3.  Troponin is 0.02.  CK-MB is 1.5 with a total CK of 195.   ASSESSMENT:  1. Unstable angina.  2. Coronary artery disease, as described above, status post coronary      artery bypass graft in 1992.  3. Diabetes.  4. Hypertension.  5. Anemia.  6. Hypokalemia.   PLAN/DISCUSSION:  Will admit her, cycle cardiac markers to rule out  myocardial infarction, will treat her with heparin, aspirin,  nitroglycerin, and a statin.  We will hold Beta blocker for a heart rate  of greater than 55.  She will probably need a cardiac catheterization  but I will review with Dr. Riley Kill in the morning for a final decision.      Bevelyn Buckles. Bensimhon, MD  Electronically Signed     DRB/MEDQ  D:  03/30/2007  T:  03/30/2007  Job:  [id number]   cc:   Bevelyn Buckles. Bensimhon, MD  Deirdre Peer. Polite, M.D.  Arturo Morton. Riley Kill, MD, [facility]

## 2011-02-09 NOTE — Cardiovascular Report (Signed)
NAMEVAUGHN, FRIEZE              ACCOUNT NO.:  [id number]   MEDICAL RECORD NO.:  [id number]          PATIENT TYPE:  INP   LOCATION:  6524                         FACILITY:  [facility]   PHYSICIAN:  Arturo Morton. Riley Kill, MD, FACCDATE OF BIRTH:  21-May-1941   DATE OF PROCEDURE:  03/30/2007  DATE OF DISCHARGE:                            CARDIAC CATHETERIZATION   INDICATIONS:  Ms. Kittel is a 70 year old well known to me.  She  underwent revascularization surgery in 1992.  At that time, she had an  internal mammary to the LAD and saphenous vein grafts to the OM1 and  OM2.  She then developed a high-grade stenosis in the saphenous vein  graft which is stented.  That subsequently developed occlusion.  There  was also high-grade disease in the native beyond the other saphenous  vein graft insertion, and that was treated with stenting.  The mammary  to the LAD has chronically been patent.  The patient has had recurrent  chest pain recently.  She has moderate bradycardia with underlying left  bundle and is on beta blockade.  We have been increasing her nitrates.  Because of the symptoms, we had discussed recently in the office the  possibility of repeating cardiac catheterization.  She subsequently  presented to the emergency room with recurrent episodes of left chest  pain, and repeat cardiac catheterization was recommended.   PROCEDURES:  1. Left heart catheterization  2. Selective coronary arteriography.  3. Selective left ventriculography  4. Saphenous vein graft angiography.  5. Selective left internal mammary angiography.   DESCRIPTION OF THE PROCEDURE:  The patient was brought to the  catheterization laboratory and prepped and draped in usual fashion.  Through an anterior puncture, using a Smart needle, the right femoral  artery was entered but with some difficulty due to scar tissue.  A 5-  French sheath was placed.  Views of the left and right coronaries were  obtained.  Saphenous vein  graft angiography and internal mammary  angiography were also performed.  Central aortic and left ventricular  pressures were measured with a pigtail, and ventriculography was  performed in the RAO projection.  Of note, the patient had some  bradycardia at baseline, and some intermittent junctional escape rhythm.  She was asymptomatic during this.  The procedure was completed without  complication.  She was taken to the holding area in satisfactory  clinical condition.   HEMODYNAMIC DATA:  1. Central aortic pressure 115/58, mean 78.  2. Left ventricular pressure 130/20.  3. No gradient on pullback across the aortic valve.   ANGIOGRAPHIC DATA:  1. The left main is free of critical disease.  2. The LAD has a high-grade stenosis prior to a small diagonal.  The      LAD after this is segmentally diseased and then totally occluded.  3. The left internal mammary to distal LAD remains intact.  There is      mild luminal irregularity in the LAD, with about 40% narrowing      after the internal mammary insertion.  4. The circumflex demonstrates a totally occluded marginal.  The AV  circumflex supplies a large posterolateral area.  The AV circumflex      has about 70% narrowing just after the takeoff of the marginal, and      this has been seen on previous studies and does not appear to be      substantially worse.  5. There is a saphenous vein graft that is intact to a large marginal      branch.  That marginal branch has several subbranches and has been      previously stented with a short non-drug-eluting platform.  The      stent itself is widely patent, without significant focal narrowing.      There is some late filling in of what appears to be a marginal      subbranch as well.  6. The other marginal saphenous vein graft is occluded, with evidence      of the stent in the proximal portion.  7. The right coronary artery is totally occluded proximally.  There is      a collateral to  the distal right which shows faint visualization of      this area.  8. Ventriculography in the RAO projection reveals ejection fraction      the range of 50%-55%, with an inferobasal wall area of hypokinesis.   CONCLUSIONS:  1. Well-preserved overall left ventricular function, with EF of 50%-      55% and inferobasal hypokinesis.  2. Continued patency of the internal mammary to the LAD.  3. Continued patency of the saphenous vein graft, presumably to the      OM2, although this could represent the OM1.  The previously placed      stent in the distal vessel was widely patent.  4. Saphenous vein graft to the OM1 or possibly OM2 is occluded.  This      has a previously placed stent proximally.  5. A severely diseased and occluded right coronary artery.   DISPOSITION:  The patient will be treated medically.  We will check a D-  dimer.  She has had intermittent junctional rhythm in the lab, and we  will decrease her beta blockers fortunately based on the above  information.  Hopefully, she will continue to stabilize.      Arturo Morton. Riley Kill, MD, [facility]  Electronically Signed     TDS/MEDQ  D:  03/30/2007  T:  03/31/2007  Job:  [id number]   cc:   CV Laboratory

## 2011-02-09 NOTE — Discharge Summary (Signed)
Christine Gay, Christine Gay              ACCOUNT NO.:  [id number]   MEDICAL RECORD NO.:  [id number]          PATIENT TYPE:  INP   LOCATION:  4711                         FACILITY:  [facility]   PHYSICIAN:  Arturo Morton. Riley Kill, MD, FACCDATE OF BIRTH:  Feb 05, 1941   DATE OF ADMISSION:  03/29/2007  DATE OF DISCHARGE:                               DISCHARGE SUMMARY   DISCHARGING PHYSICIAN:  Dr. Bonnee Quin.   PRIMARY CARDIOLOGIST:  Dr. Riley Kill.   PRIMARY CARE:  Renford Dills.   DISCHARGING DIAGNOSIS:  1. Unstable angina status post cardiac catheterization this admission      by Dr. Bonnee Quin on 03/30/2007 secondary to failure to take      medications due to financial struggles.  The patient with a known      history of coronary artery disease status post CABG in the past.      Catheterization report showed well preserved overall left      ventricular function with EF of 50-55% with inferior basal      hypokinesis.  Continued patency of the internal mammary to the LAD.      Continued patency of the saphenous vein graft, presumably to the OM-      2 although this could represent the OM1.  Previously placed stent      in the distal vessel widely patent.  Saphenous vein graft to the      OM1 or possibly OM2 is occluded.  This has a previously placed      stent proximally.  Severely diseased and occluded right coronary      artery with plans for medical therapy.  2. Intermittent junctional rhythm during cardiac catheterization with      resulting adjustment in beta blocker therapy.  3. Heart block partially secondary to decreased level in T4 in the      setting of the patient's failure to take medications secondary to      financial issues.   PAST MEDICAL HISTORY:  1. Coronary artery disease status post CABG in 1992, status post      multiple stents.  The most recent cath in 2004 showed an EF of 49%,      LAD totally occluded in the mid section, circumflex provided a      first marginal branch and  then was totally occluded, right coronary      was nondominant, saphenous vein graft to the first marginal totally      occluded, saphenous vein graft to the OM patent, LIMA to the LAD      patent.  2. Diabetes.  3. Hypertension.  4. Hyperlipidemia.  5. GERD.  6. Obesity.  7. Glaucoma.  8. Seizure disorder.  The patient on Dilantin therapy.  9. Hypothyroidism.  10.Questionable sleep apnea.   PROCEDURES THIS ADMISSION:  Cardiac catheterization on 03/30/2007,  results as stated above.   HOSPITAL COURSE:  Christine Gay is a 70 year old female with a known  history of coronary artery disease and other multiple risk factors as  stated above.  Recent echocardiogram showed an EF of 60%.  The patient  is followed closely  by Dr. Riley Kill, however, on day of admission the  patient noticed chest tightness radiating down her left arm associated  with shortness of breath with exertion.  She called EMS and was brought  to the emergency room.  Blood pressure initially 118/60, heart rate 45,  troponin 0.02.  The patient was admitted for unstable angina.  Cardiac  markers were cycled.  She was treated with heparin, aspirin,  nitroglycerin.  Beta blocker was held secondary to heart rate lower than  55.  After discussion with Dr. Riley Kill, it was decided to proceed with  cardiac catheterization for further evaluation of coronaries.  The  patient went to the cath lab on 03/30/2007.  The results as stated  above.  Blood work revealed an elevated TSH with a decreased T4.  The  patient stated she was not taking some of her medicines secondary to  financial issues.  This included her Synthroid which was resumed during  hospitalization.  Dr. Riley Kill in to see the patient on 7/4.  Heart rate  stable at 78, blood pressure 117/65, H&H at 11 and 32.5, potassium 3.2,  hemoglobin A1c 6.2, D-dimer less than 0.22, free T4 0.51.  TSH 43.  Cath  site stable.  Metoprolol restarted at a lower dose.  Dr. Riley Kill in to   see the patient on day of discharge.  The patient feeling better.  Sinus  rhythm on the monitor.  The patient is being discharged home with  instructions to follow up with Dr. Riley Kill.   DISCHARGE MEDICATIONS:  1. Amlodipine 10 mg daily.  2. Synthroid 125 mcg daily.  3. Prevacid 30 mg daily.  4. Dilantin 100 mg, 2 tablets twice a day.  5. Altace 5 mg daily.  6. Isosorbide mononitrate 60 mg, 1 tablet twice a day.  7. Aspirin 81 mg daily.  8. Metoprolol 50 mg.  The patient is going to take a fourth of a      tablet twice daily and use up the medicine that she has at home      before a new prescription is started.  9. Lipitor 40 mg at bedtime.  10.Cosopt.  11.Acular.  12.Prednisone eyedrops as previous.  13.Nitroglycerin as needed.   FOLLOWUP:  The patient is to follow up with Dr. Riley Kill in 1-2 weeks.  Our office will call the patient with an appointment.  The patient has  been given the post cardiac catheterization discharge instructions and  agrees to call our office if she has any problems.   DURATION OF DISCHARGE ENCOUNTER:  Greater than 30 minutes.      Dorian Pod, ACNP      Arturo Morton. Riley Kill, MD, [facility]  Electronically Signed    MB/MEDQ  D:  04/01/2007  T:  04/01/2007  Job:  [id number]   cc:   Deirdre Peer. Polite, M.D.

## 2011-02-09 NOTE — Assessment & Plan Note (Signed)
[facility]                            CARDIOLOGY OFFICE NOTE   DELBERT, VU                     MRN:          [id number]  DATE:03/27/2007                            DOB:          09-17-1941    Shellyann is in for followup.  In general she has remained stable.  Her  chest discomfort has improved somewhat.  She underwent a repeat  echocardiogram.  Fortunately the echocardiogram demonstrated normal left  ventricular size, estimated ejection fraction of 60%.  Wall thickness  was mildly increased.  There was moderate focal basal septal  hypertrophy.  Aortic valve was moderately calcified.  There was mild  aortic valve regurgitation.  The left atrium was mildly dilated.  She is  feeling reasonably well.  Her blood pressures have been somewhat  elevated.  She remains a little bit short of breath.   PHYSICAL EXAMINATION:  VITAL SIGNS:  Weight 211 pounds, blood pressure  172/82, pulse 60.  GENERAL:  There is paradoxical splitting of the second heart sound with  nondisplacement of the PMI.  LUNGS:  Lung fields are clear to auscultation and percussion.   Her electrocardiogram demonstrates normal sinus rhythm with left bundle  branch block and an occasional premature ventricular contraction.   SUMMARY:  To summarize Ms. Mcveigh is stable.  She would prefer not to  have a cardiac catheterization at the present time.  I plan to see her  back in followup in a month or two, and we will continue to follow her  closely.  In the interim we will take the liberty of increasing her  Altace to 5 mg p.o. b.i.d.  It sounds as though she has had some trouble  getting her medicines recently.  I am not sure whether the elevated  blood pressure could be a reflection of this.  She says not.  We will  therefore make the change in her medicine, and I will see her back.     Arturo Morton. Riley Kill, MD, [facility]  Electronically Signed    TDS/MedQ  DD: 03/27/2007  DT: 03/28/2007   Job #: [id number]

## 2011-02-09 NOTE — H&P (Signed)
Christine Gay, Christine Gay              ACCOUNT NO.:  [id number]   MEDICAL RECORD NO.:  [id number]          PATIENT TYPE:  INP   LOCATION:  2928                         FACILITY:  [facility]   PHYSICIAN:  Bevelyn Buckles. Bensimhon, MDDATE OF BIRTH:  Feb 21, 1941   DATE OF ADMISSION:  08/17/2007  DATE OF DISCHARGE:                              HISTORY & PHYSICAL   CARDIOLOGIST:  Arturo Morton. Riley Kill, M.D., F.A.C.C.   PRIMARY CARE PHYSICIAN:  Deirdre Peer. Polite, M.D.   CHIEF COMPLAINT:  Chest pain.   HISTORY OF PRESENT ILLNESS:  This is a 70 year old female with a history  of three-vessel obstructive CAD, status post CABG in 1992, and a recent  cath in July 2008.  She comes in reporting chest pain since 11 a.m.  located on her left chest radiating to her left shoulder, arm, and jaw.  She had mild shortness of breath, but no nausea, diaphoresis, or syncope  with this chest pain.  The pain has been intermittent, but has responded  to sublingual nitroglycerin at home.  It recurred during transport via  EMS to [facility] and again responded to nitroglycerin.  She is also  describing stable angina symptoms with shortness of breath and chest  pain with exertion which she has had consistently for several years.   PAST MEDICAL HISTORY:  1. Prior catheterization in July 2008 indicating a LIMA to LAD graft      that was patent.  SVG top OM graft that was 100% occluded.  Another      SVG to OM2 graft that was patent.  Circumflex that was occluded      with an AV circumflex branch with a 70% stenosis.  A left      ventricular ejection fraction of 50% to 55%.  2. Diabetes mellitus type 2.  3. Seizure disorder.  4. Hypothyroidism.  5. Hypertension.  6. Status post CABG in 1992.  7. Hyperlipidemia.  8. GERD.  9. Obesity.  10.Glaucoma.  11.History of cataract removal, both eyes.  12.Status post cholecystectomy.  13.Status post bilateral tubal ligation.  14.History of left bundle branch block.   ALLERGIES:  The  patient is allergic to DEMEROL and MORPHINE.   MEDICATIONS:  1. Isosorbide 60 mg twice daily.  2. Metoprolol 25 mg twice daily.  3. Lipitor 40 mg daily.  4. Dilantin 200 mg p.o. daily.  5. Norvasc 10 mg daily.  6. Aspirin 81 mg daily.  7. Synthroid 125 mcg daily.  8. Prevacid 30 mg daily.  9. Altace 5 mg p.o. daily.  10.Multivitamin.  11.Cosopt eye drops.   SOCIAL HISTORY:  The patient lives in [place] alone.  Does not have a  smoking or alcohol abuse history.   FAMILY HISTORY:  Mother had diabetes.  Does not know of significant  amount of illnesses experienced by her father.   REVIEW OF SYSTEMS:  CONSTITUTIONAL:  Recent chills, but no fevers, or  sweats.  HEENT:  No headaches, vertigo, photophobia, or vision or  hearing loss recently.  SKIN:  No rashes or lesions noted.  CARDIOPULMONARY:  Noted for chest pain, shortness of breath,  dyspnea on  exertion, orthopnea, but no PND.  The patient has been experiencing  edema, but no palpitations recently.  GU:  The patient denies any  frequency of urination or urgency.  No hematuria.  NEUROPSYCHIATRY:  The  patient has not been experiencing any generalized weakness or numbness  recently.  GI:  The patient does not have any complaints related to  change in bowel habits, but has been experiencing nausea recently and  GERD symptoms.   ADMITTING PHYSICAL:  VITAL SIGNS:  Temperature 98.1, pulse 84, blood  pressure 130/72, and saturating 97% on room air.  GENERAL:  The patient is in no acute distress.  She is morbidly obese.  HEENT:  Pupils are equally round and reactive.  Her extraocular eye  movements are intact.  Sclerae are clear bilaterally.  Oropharynx  without erythema or exudate.  NECK:  Supple without bruit or JVD.  CARDIOVASCULAR:  Heart has a regular rate and rhythm with normal S1 and  S2 without murmurs, rubs, or gallops.  A +2 radial pulses bilaterally.  LUNGS:  Clear bilaterally to auscultation.  SKIN:  Without rashes or  lesions.  ABDOMEN:  Protuberant, but soft and nontender.  No rebound, guarding, or  hepatosplenomegaly.  EXTREMITIES:  No cyanosis, clubbing, or edema.  NEURO:  Alert and oriented x3.  Cranial nerves II through XII intact.  Muscle strength is +5 in all extremities.   Chest x-ray indicates cardiomegaly without any cardiopulmonary acute  changes.  EKG shows rate of 72, normal sinus rhythm with a left bundle  branch block.  Axis is normal.  PR interval is 0.16.  QRS interval is  0.14.  QTc is 523.  No ST ischemic changes. No left ventricular  hypertrophy.   ADMITTING LABS:  Hemoglobin 12.2, sodium 143, potassium 2.7, chloride  103, BUN 6, creatinine 0.9, glucose 131, BNP 202, and magnesium 2.0.  Cardiac markers with CK of 87.5, troponin less than 0.05, and CK-MB is  less than 1.0.   ASSESSMENT AND PLAN:  This is a 70 year old female with the history of  coronary artery disease status post coronary artery bypass graft in 1992  with recent catheterization showing three-vessel disease and 1 graft  that is totally occluded, who is coming in for chest pain.  1. Chest pain.  Unstable angina versus myocardial infarction versus      musculoskeletal pain.  The patient is somewhat tender on her left      chest, which makes unstable angina a little less likely, but we      will ivestigate the possibility of UA and rule out MI.  We will      place her on a heparin drip, aspirin 325 mg, and perform a      catheterization in the morning.  We will provide a nitroglycerin      drip for her intermittent chest pain and give her a metoprolol 25      mg b.i.d. to help control her pain and her blood pressure.  We will      hold the patient n.p.o. overnight and provide Plavix in the morning      prior to her catheterization.  2. Hypokalemia.  The patient received four runs of intravenous      potassium overnight.  We also are writing for two 40 mEq p.o. doses      of potassium.  We will check another  potassium level at 12 a.m. and      another in the  morning at 6 a.m.  The patient is currently not      experiencing any EKG changes related to hypokalemia.  3. Hypothyroidism.  The patient is currently taking 125 mcg of      levothyroxine daily.  We will recheck a TSH in the morning.  4. Hypertension.  We will place the patient on metoprolol, lisinopril,      and Norvasc to control her blood pressure.  On admission, her blood      pressure was 130/72, which is at goal for the patient.  We will      continue the current medication regimen during her hospital stay.      Lollie Sails, MD  Electronically Signed      Bevelyn Buckles. Bensimhon, MD  Electronically Signed    CB/MEDQ  D:  08/18/2007  T:  08/19/2007  Job:  [id number]

## 2011-02-09 NOTE — Assessment & Plan Note (Signed)
[facility]                            CARDIOLOGY OFFICE NOTE   Christine Gay, Christine Gay                     MRN:          [id number]  DATE:10/03/2007                            DOB:          13-Mar-1941    Christine Gay is in for followup.  In general, she is feeling a lot better.  Her blood pressure is up.  She is feeling less week.  When she was last  seen, we had her stop her spironolactone and her Altace.  Her potassium  at that time was okay.  She stopped her potassium 2 days later.  Overall  though she is feeling better.  She denies any chest pain or significant  shortness of breath.   PRESENT MEDICATIONS:  1. Include post-op and eye drops 2 daily.  2. Ocular LS 0.4% 2 daily.  3. Prednisolone right eye daily.  4. Lipitor 80 mg nightly.  5. Isosorbide mononitrate 6 mg 2 tablets b.i.d.  6. Potassium.  7. Dilantin 100 mg 2 tablets 2 times daily.  8. Aspirin 81 mg daily.  9. Synthroid 125 mcg daily.  10.Prevacid 30 mg daily.  11.Multivitamin.  12.Actonel.  13.Carvedilol 6.25 mg p.o. b.i.d.  14.Amlodipine besylate 10 mg daily.   PHYSICAL:  She is alert and oriented.  Blood pressure today is 147/82, the pulse is 52.  The lung fields are clear.  The cardiac rhythm is regular.  EXTREMITIES:  Reveal no edema.   The electrocardiogram demonstrates sinus bradycardia, with left bundle  branch block unchanged but slightly higher rate on the previous tracing.   IMPRESSION:  1. Coronary disease with prior coronary bypass graft surgery and      subsequent stenting.  2. Hypertension.  3. History of hypokalemia.  4. Hypothyroidism on Synthroid replacement.   PLAN:  1. Return to clinic in 3 weeks.  2. Basic metabolic profile and TSH today.     Arturo Morton. Riley Kill, MD, [facility]  Electronically Signed    TDS/MedQ  DD: 10/03/2007  DT: 10/03/2007  Job #: [id number]

## 2011-02-09 NOTE — Assessment & Plan Note (Signed)
[facility]                            CARDIOLOGY OFFICE NOTE   AMAIYA, SCRUTON                     MRN:          [id number]  DATE:12/08/2007                            DOB:          15-Jan-1941    Christine Gay is in for follow-up.  She is generally stable.  She has not been  having any chest pain or shortness of breath.  She denies any increase  in symptoms.   MEDICATIONS:  1. Cosopt eyedrops two daily.  2. Ocular LS 0.4% two daily.  3. Prednisolone 1% right eye daily.  4. Lipitor 80 mg q.h.s.  5. Isosorbide mononitrate 120 mg daily.  6. Coreg 6.25 mg p.o. b.i.d.  7. Synthroid 112 mcg daily.  8. Kay Ciel 20 mEq daily.  9. Dilantin 100 mg 2 tablets b.i.d.  10.Aspirin 81 mg daily.  11.Prevacid 30 mg daily.  12.Multivitamin daily.  13.Actonel 35 mg weekly.  14.Amlodipine 10 mg daily.  15.Caltrate 500 mg daily.   PHYSICAL EXAMINATION:  She is alert and oriented in no distress.  Blood pressure is 112/76 and the pulse is 56.  There is paradoxical  splitting of the second heart sound.  The lung fields are clear to auscultation and percussion. The  extremities reveal no edema.   IMPRESSION:  1. Coronary artery disease status post coronary artery bypass graft      surgery and percutaneous intervention.  2. Hypercholesterolemia on lipid lowering therapy.  3. Hypothyroidism with recent low TSH.  4. Hypokalemia on no diuretic therapy.   PLAN:  1. Check TSH.  2. Recheck potassium.  3. Return to clinic in 3 months.    Arturo Morton. Riley Kill, MD, [facility]  Electronically Signed   TDS/MedQ  DD: 12/18/2007  DT: 12/18/2007  Job #: [id number]

## 2011-02-12 NOTE — Discharge Summary (Signed)
NAME:  Christine Gay, Christine Gay                        ACCOUNT NO.:  [id number]   MEDICAL RECORD NO.:  [id number]                   PATIENT TYPE:  INP   LOCATION:  2034                                 FACILITY:  [facility]   PHYSICIAN:  Arturo Morton. Riley Kill, M.D. [facility]         DATE OF BIRTH:  [birth year]   DATE OF ADMISSION:  10/03/2002  DATE OF DISCHARGE:  10/05/2002                           DISCHARGE SUMMARY - REFERRING   PROCEDURES:  1. Cardiac catheterization.  2. Coronary arteriogram.  3. Left ventriculogram.  4. Graft angiogram.   HOSPITAL COURSE:  The patient is a 70 year-old female with a history of  bypass surgery in 1992.  She had PTCA of an OM2 in October 2003 to a  saphenous vein graft with a Cypher stent.  She was seen in the office on  October 03, 2002 with a two day history of increasing substernal chest pain  and shortness of breath.  It was felt that she needed hospital admission and  cardiac catheterization.   The patient was admitted to the hospital for an acute coronary syndrome and  ruled out for a MI.  She was scheduled for a cardiac catheterization which  was performed on October 04, 2002.  The cardiac catheterization showed a  normal left main and an LAD of 95% proximal and total in the mid portion.  The circumflex had a 70% stenosis and the OM was total in the mid portion.  The RCA was total proximally.  The LIMA to LAD was patent and the saphenous  vein graft to OM2 was patent as well.  The stent to the OM2 distal to the  anastomosis was patent.  There was an 80% stenosis to the side branch and a  50% stenosis prior to the stent.  The saphenous vein graft to OM1 was  occluded by previous catheterization.  Her ejection fraction was 49% with 1+  MR.  There was a very distal 40 to 50% stenosis in the LAD.  The films were  reviewed by Dr. Riley Kill and it was felt that medical therapy was the best  option for her.   Post procedure the patient had oozing once her wedge dressing  was removed,  but this was well controlled with five minutes of groin pressure and she had  minimal blood loss.  The next day her catheterization site was without  ecchymosis or hematoma.  The patient also had received nitroglycerin paste  but had raised whelped erythematous areas where this was applied and this  was discontinued.  She had previously had a reaction to the nitroglycerin  patch.  She has had no problems taking Imdur to sublingual nitroglycerin.  Pending review by Dr. Riley Kill, the patient is potentially stable for  discharge on October 05, 2002 with outpatient follow-up arranged.   Chest x-ray:  Cardiomegaly with no edema.   LABORATORY DATA:  Serial CK, MB and Troponin I negative for MI.  Hemoglobin  12.0, hematocrit 35.5, WBC 7.7, platelets 236,000.  Sodium 141, potassium  3.7, chloride 109, C02 26, BUN 9, creatinine 0.8, glucose 103.   DISCHARGE CONDITION:  Stable.   DISCHARGE DIAGNOSES:  1. Anginal chest pain, medical therapy recommended.  2. Status post aortocoronary bypass surgery in 1992 with left internal     mammary artery to left anterior descending, saphenous vein graft to OM2     and saphenous vein graft to OM1.  3. 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  4. Status post percutaneous transluminal coronary angioplasty and stent to     the OM2 through the saphenous vein graft using a Cypher stent in October     2003.  5. Left ventricular dysfunction within an ejection fraction of 49% by     catheterization this admission.  6. Allergies to DEMEROL, MORPHINE, NITROGLYCERIN PATCH and NITROGLYCERIN     PASTE.  7. Gastroesophageal reflux disease symptoms.  8. Hypothyroidism.  9. Carrier of sickle cell trait.  10.      Seizure disorder.  11.      Diet controlled diabetes.  12.      Obesity.  13.      History of elevated alkaline phosphatase.  14.      Status post cholecystectomy, bilateral tubal ligation and cataract     surgery.  15.       History of left bundle branch block.   DISCHARGE INSTRUCTIONS:  1. Her activity level is to include no driving, sexual or strenuous activity     for two days.  2. She is to stick to a low fat, diabetic diet.  3. She is to call the office for problems with the catheterization site.  4. She is to follow-up with the physician's assistant for Dr. Riley Kill on     January 22 at 4 p.m.  5. She is to follow-up with Dr. Marlyne Beards as needed.   DISCHARGE MEDICATIONS:  1. Multivitamin daily.  2. Cosopt, Alphagan, prednisolone, Acular and Lumigan eyedrops as at home.  3. Metoprolol 50 mg one half tablet t.i.d.  4. Synthroid 0.1 mg daily.  5. Norvasc 10 mg daily.  6. Prevacid 30 mg daily.  7. Lipitor 40 mg two q.h.s.  8. Aspirin 325 mg daily.  9. Folic acid 800 mcg daily.  10.      Calcium with vitamin D daily.  11.      Nitroglycerin p.r.n.  12.      Dilantin 100 mg two tablets b.i.d.  13.      Isosorbide 60 mg two tablets b.i.d.     Lavella Hammock, P.A. [facility]                  Thomas D. Riley Kill, M.D. [facility]    RG/MEDQ  D:  10/05/2002  T:  10/05/2002  Job:  [id number]   cc:   Eber Hong, P.A.  [address]  [place], [place] [address]  Fax: 1

## 2011-02-12 NOTE — Cardiovascular Report (Signed)
NAME:  Christine Gay, Christine Gay                        ACCOUNT NO.:  [id number]   MEDICAL RECORD NO.:  [id number]                   PATIENT TYPE:  INP   LOCATION:  2034                                 FACILITY:  [facility]   PHYSICIAN:  Arturo Morton. Riley Kill, M.D. [facility]         DATE OF BIRTH:  [birth year]   DATE OF PROCEDURE:  10/04/2002  DATE OF DISCHARGE:                              CARDIAC CATHETERIZATION   INDICATIONS FOR PROCEDURE:  The patient is well known to me.  She is a 70-  year-old female who underwent revascularization surgery in 1992.  She has  had subsequent multiple other procedures.  She has had a stent placed in the  saphenous vein graft to the OM-1, and this was subsequently occluded.  She  has had a stent placed to the native artery of the OM-2 beyond the graft  insertion, and this was done in the fall of this year.  At that time, the  internal mammary was patent.  Over the last few weeks, she has had some more  recurrent chest discomfort, and the current study was done to assess  coronary anatomy.   PROCEDURE:  1. Left heart catheterization.  2. Selective coronary arteriography.  3. Selective left ventriculography.  4. Saphenous vein graft angiography x1.  5. Selective left internal mammary angiography.   DESCRIPTION OF PROCEDURE:  The procedure was performed from the right  femoral artery.  We needed a Smart needle to gain access to the RFA, and as  in the previous procedure, this was quite difficult.  We were ultimately  able to get into the vessel, and there was no difficulty after this point in  time.  Because of the patient's elevated blood pressure, we gave her two  doses of labetalol 20 mg intravenously.  She tolerated this well without any  complication.  She was kept in the catheterization laboratory and the  sheaths removed with direct manual compression applied.   HEMODYNAMIC DATA:  1. Central aortic pressure:  177/94, mean 127.  2. Left ventricle:  163/17.  3.  No gradient on pullback across the aortic valve.   ANGIOGRAPHIC DATA:  1. Ventriculography was performed in the RAO projection.  There was mild     generalized hypokinesis, perhaps more so in the inferior segment.     Ejection fraction was calculated at 49%.  There was mild mitral     regurgitation noted.  2. The left main coronary artery was free of critical disease.  3. The LAD was totally occluded beyond the second septal.  Proximally, there     is a 95% stenosis prior to the origin of a small diagonal and the two     septal perforators.  Distally, there is an internal mammary to the distal     LAD with about 40% narrowing after the insertion site.  The mammary     itself is widely patent.  4. The circumflex provides a  first marginal branch which is diffusely     diseased and then totally occluded.  5. The saphenous vein graft to the first marginal branch is totally occluded     at its ostium.  6. The AV circumflex has about a 70% stenosis at its takeoff from the first     marginal branch.  This provides a small posterior descending system with     three small branches.  Importantly, this looks about the same as on     previous studies.  7. The saphenous vein graft to the OM-2 is intact.  There is about 50%     narrowing in a bend and then distally at the previous site of stenting,     this is widely patent.  The small sub branch which had occluded during     the course of the procedure now is open with about 80% ostial narrowing     and is quite small.  The distal portion beyond the stent consists of two     marginal branches which is moderate in size.  8. The right coronary artery is a nondominant vessel.  It is totally     occluded just after the takeoff of the conus branch.  The conus branch     provides collaterals to the remaining nondominant vessel.   CONCLUSION:  1. Continued patency of the internal mammary to the distal left anterior     descending artery.  2. Continued  patency of the saphenous vein graft to the obtuse marginal #2     with continued patency of the stent placed distally beyond the graft     insertion site.  3. High-grade stenosis in the proximal left anterior descending artery     supplying two small septals and a diagonal.  4. Moderately high-grade stenosis of the AV circumflex leading into a small     posterior descending system.  5. Total occlusion of the right coronary artery as noted above.  6. Total occlusion of the obtuse marginal #1 graft.   DISPOSITION:  Fortunately, the stent site remains open.  The internal  mammary also remains open.  The OM is occluded chronically.  The AV  circumflex does not appear to be substantially changed.  At the present  time, my leaning would be towards continued medical therapy.  I have  explained this to the daughter and to the patient.                                               Arturo Morton. Riley Kill, M.D. [facility]    TDS/MEDQ  D:  10/04/2002  T:  10/04/2002  Job:  [id number]   cc:   Cardiac Catheterization Lab

## 2011-02-12 NOTE — H&P (Signed)
NAME:  Christine Gay, SINGLETON                        ACCOUNT NO.:  [id number]   MEDICAL RECORD NO.:  [id number]                   PATIENT TYPE:  INP   LOCATION:  4703                                 FACILITY:  [facility]   PHYSICIAN:  Thora Lance, M.D.               DATE OF BIRTH:  09-13-1941   DATE OF ADMISSION:  12/05/2002  DATE OF DISCHARGE:                                HISTORY & PHYSICAL   CHIEF COMPLAINT:  Chest pain.   HISTORY OF PRESENT ILLNESS:  This is a 70 year old black female with known  history of coronary artery disease who presents with chest pain starting  last night at about 9:30 and continued intermittently throughout the night.  It was described as being like her usual angina. She described it as a  pressure in the left chest which radiated into the left neck and left elbow  with mild shortness of breath.  She took nitroglycerin x 4 at home  throughout the night with only partial relief of her pain.  The pain would  come and go, not lasting more than 20 minutes at a time.  She came to the ER  early this morning and was given IV nitroglycerin with resolution of her  pain but when the IV nitroglycerin was stopped the pain returned.  The  patient was last admitted for unstable angina in January 2004 where she  underwent a cardiac catheterization.  Since that admission, she has only had  one episode of chest pain prior to last night which occurred while she was  doing some housework.  The patient is status post CABG in 1992 with LIMA to  the LAD, SVG to OM-1, SVG to OM-2.  In the late 1990s, she had a stent  placed in the SVG to OM-1 which was subsequently occluded.  In October 2003,  she had a stent of the OM-2 to SVG.  She was admitted January 2004 again  with unstable angina and again had a catheterization which showed patency of  the LIMA to LAD, a patent SVG to OM-2 and a patent stent in the OM-2 after  the graft site.  OM-1 was chronically occluded.  Medical  management was  recommended by her cardiologist, Dr. Riley Kill.   PAST MEDICAL HISTORY:  1. Coronary artery disease as above.  2. Diabetes mellitus, diet controlled.  3. Hypothyroidism.  4. Gastroesophageal reflux disease.  5. Seizure disorder.  6. Obesity.  7. Glaucoma.  8. Sickle cell carrier.  9. Increased alkaline phosphatase.   PAST SURGICAL HISTORY:  1. Cholecystectomy.  2. Bilateral tubal ligation.  3. Cataract.   ALLERGIES:  DEMEROL, MORPHINE and NITROGLYCERIN PATCH caused a rash.   MEDICATIONS:  1. Dilantin 100 mg two tablets b.i.d.  2. Lipitor 40 mg two tablets q.h.s.  3. Prevacid 30 mg one p.o. daily.  4. Folic acid once a day.  5. Multivitamin daily.  6. Calcium 600  mg two tablets a day.  7. Enteric-coated aspirin 325 mg daily.  8. Metoprolol 50 mg one half tablet t.i.d.  9. __________  10 mg one p.o. daily.  10.      Synthroid 100 mcg daily.  11.      Sublingual nitroglycerin p.r.n. chest pain.  12.      Cosopt one drop OU b.i.d.  13.      Alphagan one drop OS t.i.d.  14.      Lumigan one drop OD q.h.s.  15.      Imdur 60 mg two tablets daily.  16.      Prednisone acetate four drops in eyes daily.  17.      Acular p.r.n.  18.      __________  OS p.r.n.   FAMILY HISTORY:  Mother died at age 13 secondary to diabetic coma.  Dad died  at age 59 of unknown cause.   SOCIAL HISTORY:  Married with two children.  Lives in [place] with her  husband.  Denies tobacco, alcohol or illicit drug use.   REVIEW OF SYSTEMS:  Negative.   PHYSICAL EXAMINATION:  GENERAL:  Obese black female.  VITAL SIGNS:  Blood pressure 166/72, heart rate 76, respirations 24,  temperature 98.6, oxygen saturation 98% on room air.  HEENT:  Pupils equal and respond to light.  Extraocular movements intact.  Ears - tympanic membranes are clear.  Oropharynx clear.  NECK:  Supple.  No bruits.  LUNGS:  Clear.  HEART:  Regular rate and rhythm.  No murmur, gallop or rub,  distant.  ABDOMEN:   Obese, soft, nontender.  No mass or hepatosplenomegaly.  EXTREMITIES:  No edema.  Normal peripheral pulses bilaterally.  NEUROLOGIC:  Nonfocal.   LABORATORY DATA:  CBC showed WBC 7.9, hemoglobin 12.9, platelet count 233.  Chemistry - sodium 140, potassium 2.5, chloride 107, bicarbonate 19,  creatinine 1.  CK 142, CK-MB 1.4, troponin-I 0.02.  Chest x-ray showed  cardiomegaly, mild vascular congestion.  EKG showed normal sinus rhythm with  a left bundle branch block.   ASSESSMENT:  Unstable angina pectoris with known coronary artery disease.  Her last cardiac catheterization was in January 2004 and showed patency of  her left internal mammary artery to the left anterior descending, saphenous  vein graft to obtuse marginal-2, and the recently stented native obtuse  marginal-2.  Medical management was recommended at that time.   PLAN:  1. Admit.  2. Nitroglycerin IV.  3. Aspirin.  4. Add Plavix.  5. Lovenox subcutaneously.  6.     Cardiac enzymes.  7. Outpatient medications.  8. Cardiology consultation.                                               Thora Lance, M.D.    Delorse Limber  D:  12/05/2002  T:  12/05/2002  Job:  [id number]   cc:   Arturo Morton. Riley Kill, M.D. [facility]   Lilla Shook, M.D.  [address], [address]  [place]  [place]  [address]  Fax: [phone]

## 2011-02-12 NOTE — Discharge Summary (Signed)
[place]  Patient:    Christine Gay, Christine Gay                     MRN: [id number] Adm. Date:  [id number] Disc. Date: [id number] Attending:  Ronaldo Miyamoto Dictator:   Lavella Hammock, P.A. CC:         Modesta Messing, M.D.   Referring Physician Discharge [place]  DATE OF BIRTH:  01/12/1941  PROCEDURES: 1. Cardiac catheterization. 2. Coronary arteriogram. 3. Left ventriculogram.  HOSPITAL COURSE:  Mrs. Maxham is a 70 year old female with a history of coronary artery disease who had bypass surgery in 1992.  She awoke at 4:30 on the morning of admission with chest pain that worsened as she went to the bathroom.  She took a nitroglycerin with some relief.  The symptoms were predictably precipitated by exertion, such as sweeping or walking briskly. This morning was the first time the pain had awakened her or that she had had rest pain.  It was felt that she had crescendo angina, and she needed to be admitted to rule out MI and for further evaluation.  She was scheduled for a cardiac catheterization, and this was done on April 05, 2000.  The left main was normal.  The LAD had two 90% distal lesions and was totaled in the mid section.  The circumflex had a marginal branch which was totally occluded. The AV circumflex had about 70% eccentric narrowing that filled the posterior descending system from the circumflex side.  The first and second marginals were occluded but the distal vessels were widely patent.  The RCA was totalled.  The LIMA to LAD was patent, but there was an 80% and a 90% distal stenosis to the anastomosis.  The SVG to OM-2 had some diffuse luminal irregularities but was covering a substantial portion of the lateral wall. The SVG to OM-1 had been occluded in a prior catheterization.  The EF was at least 55% with inferobasal hypokinesis.  It was felt that medical therapy was indicated.  The next day, her heart rate was dropping into  the 40s and 50s and her Lopressor was held but her blood pressure was stable.  She otherwise had no difficulties, and with the medication adjustment she was considered stable for discharge on April 06, 2000.  DISCHARGE CONDITION:  Stable.  CONSULTS:  None.  COMPLICATIONS:  None.  DISCHARGE DIAGNOSES: 1. Coronary artery disease, severe native three-vessel coronary artery disease    with two patent bypass grafts. 2. Hyperlipoproteinemia. 3. Treated hypothyroidism. 4. Treated hypertension. 5. Status post aortocoronary bypass surgery in 1992 with left internal mammary    artery to the left anterior descending artery, saphenous vein graft to    the second obtuse marginal, and saphenous vein graft to the first obtuse    marginal. (Saphenous vein graft to first obtuse marginal was totally    occluded by catheterization in November 1999.) 6. History of seizure disorder. 7. Status post bilateral tubal ligation and cholecystectomy.  DISCHARGE INSTRUCTIONS:  ACTIVITY:  No heavy lifting and no driving for 48 hours.  WOUND CARE:  She is to clean her catheterization site with soap and water.  FOLLOW-UP:  The office is to call her with an appointment.  DISCHARGE MEDICATIONS: 1. Aspirin 81 mg p.o. q.d. 2. Imdur 120 mg p.o. q.d. 3. Norvasc 10 mg q.d. 4. Synthroid as at home. 5. Dilantin as at home. 6. Lipitor as taken prior to admission. 7. Lopressor  50 mg b.i.d. 8. Prevacid as taken prior to admission. DD:  05/06/00 TD:  05/06/00 Job: [id number]

## 2011-02-12 NOTE — H&P (Signed)
NAME:  Christine Gay, Christine Gay                        ACCOUNT NO.:  [id number]   MEDICAL RECORD NO.:  [id number]                   PATIENT TYPE:  INP   LOCATION:  3705                                 FACILITY:  [facility]   PHYSICIAN:  Willa Rough, MD [facility]                DATE OF BIRTH:  09-27-1941   DATE OF ADMISSION:  07/05/2002  DATE OF DISCHARGE:                                HISTORY & PHYSICAL   HISTORY OF PRESENT ILLNESS:  The patient is a 70 year old female, with a  history of coronary artery bypass grafting in 1992, with subsequent total  occlusion of one of her three grafts, who presents to the emergency room  with a severe, sharp anterior chest discomfort which awakened her earlier  this morning, and which was responsive to sublingual nitroglycerin.   The patient's cardiac history is notable for prior three vessel coronary  artery bypass grafting in 1992, with grafting of the LIMA-LAD; SVG-OM2; SVG-  OM1 graft.  A subsequent catheterization in 1999, was notable for a total  occlusion of SVG-OM1 graft.  More recently, she had a re-look coronary  angiogram in 7/01, which revealed a severe native three vessel coronary  artery disease with continued wide patency of the LIMA-LAD graft, but with  80% and 90% lesions distal to the anastomosis; continued wide patency of the  SVG-OM2 graft; and a chronic occlusion of the SVG-OM1 graft.  Left  ventricular function was preserved (ejection fraction 55%), and the patient  was treated medically.  She had presented with chest pain and ruled out for  myocardial infarction.   She now presents to the emergency room with recurrent chest discomfort over  this past week.  Her initial symptoms occurred this past Monday after she  had walked up some stairs carrying bags of groceries.  She had some mild  anterior chest tightness which resolved with rest.  This morning, she was  awakened at approximately 1 a.m. by severe, sharp anterior chest pain which  radiated to the throat, but not to the upper extremities.  She noted  associated dyspnea, but no diaphoresis or nausea.  The symptoms were  intermittent during the early morning hours.  She ultimately took three  nitroglycerin tablets with relief of the pain, but with persistent chest  tightness.  Her symptoms also did not improve after taking her daily  Prevacid early this morning.   On presentation to the emergency room, the patient noted to have left bundle  branch block on electrocardiogram, which is chronic, thus leaving the EKG  uninterpretable.  Her chest x-ray shows cardiomegaly, but no evidence of  congestive heart failure.  Her cardiac enzymes are currently pending.   The patient states that these symptoms are new and different from those she  recalled prior to her coronary artery bypass grafting.  She also states that  these are not similar to her typical heartburn symptoms.  ALLERGIES:  1. DEMEROL.  2. NITROGLYCERIN PATCH (rash).   CURRENT MEDICATIONS:  1. Dilantin 200 mg two b.i.d.  2. Lipitor 80 mg q.d.  3. Synthroid 0.15 mg q.d.  4. Aspirin 81 mg q.d.  5. Imdur 120 mg q.d.  6. Metoprolol 50 mg q.d.  7. Prevacid 30 mg q.d.  8. Norvasc 5 mg q.d.  9. Cosopt one drop o.u. q.12h.  10.      Alphagan one drop o.s. t.i.d.  11.      Acular 0.5% one drop o.s. q.i.d.   PAST MEDICAL HISTORY:  1. Hypothyroidism.  2. Sickle cell carrier.  3. Seizure disorder.  4. Gastroesophageal reflux disease.  5. Obesity.  6. History of elevated alkaline phosphatase.   PAST SURGICAL HISTORY:  1. Status post cholecystectomy.  2. Tubal ligation.  3. Cataract surgery.   REVIEW OF SYMPTOMS:  Negative for paroxysmal nocturnal dyspnea, orthopnea,  or pedal edema.  Negative for hemoptysis, hematuria, or melena.  Negative  for recent fever, cough, or chills.  Remaining review of systems negative.   SOCIAL HISTORY:  Married, two children.  Lives here in [place] with her  husband.   Currently unemployed.  Denies any history of tobacco or alcohol  use.   FAMILY HISTORY:  Mother deceased at age 42, secondary to diabetic coma.  Father deceased at age 63, unknown cause.  The patient is an only child.   PHYSICAL EXAMINATION:  VITAL SIGNS:  Blood pressure 174/88, pulse 70 and  regular, respiratory rate 24, temperature 98.0, FAO2 99% (on room air).  GENERAL:  A 70 year old female, no active disease.  HEENT:  Normocephalic, atraumatic.  NECK:  Carotid pulses without bruits.  Unable to exclude jugular venous  distention.  LUNGS:  Clear to auscultation in all fields.  HEART:  Regular rate and rhythm (S1 and S2), no murmurs, rubs, or gallops.  ABDOMEN:  Soft, nontender.  Intact bowel sounds without bruits.  No  hepatosplenomegaly.  EXTREMITIES:  Preserved bilateral femoral pulses without bruits, intact  distal pulses with preserved left dorsalis pedis pulse, but non-palpable  right dorsalis pedis pulse.  Trace right pedal edema.  NEUROLOGIC:  Alert and oriented.   LABORATORY DATA:  White blood cell count 7.6, hemoglobin 11.9, hematocrit  35.9, platelets 222.   IMPRESSION:  1. Unstable anginal pectoris.  2. Coronary artery disease.     a. Status post three vessel coronary artery bypass grafting in 1992.     b. Occluded saphenous vein graft/obtuse marginal #1 graft in 1999; 2/3        graft patent with left anterior descending disease by catheterization        in 7/01.     c. Preserved left ventricular function.  3. Chronic left bundle branch block.  4. Type 2 diabetes mellitus, diet controlled.  5. Dyslipidemia.  6. Hypertension.  7. Gastroesophageal reflux disease.  8. Treated hypothyroidism.  9. Obesity.   PLAN:  Following review with Dr. Myrtis Ser, recommendation is to proceed with  admission for evaluation and management of unstable angina pectoris.  Given the chronic left bundle branch block, the electrocardiogram is  nondiagnostic.  However, we will therefore  proceed with placing the patient  on Integrilin in addition to Lovenox and intravenous nitroglycerin.  She  will continue on aspirin and Lopressor.  We will cycle enzymes and  electrocardiograms and plan to proceed with re-look coronary angiography and  possible percutaneous intervention tomorrow.  The patient has been apprised  of the risks/benefits of  the procedure, and is agreeable to proceed.       Gene Serpe, P.A. [facility]                      Willa Rough, MD [facility]    GS/MEDQ  D:  07/05/2002  T:  07/08/2002  Job:  [id number]   cc:   Lilla Shook, M.D.  [address], [address]  [place]  [place]  [address]  Fax: [phone]

## 2011-02-12 NOTE — Op Note (Signed)
[place]. [facility]  Patient:    Christine Gay, Christine Gay Visit Number: [id number] MRN: [id number]          Service Type: DSU Location: [phone] Attending Physician:  Karenann Cai Dictated by:   Marya Landry Carlyle Lipa., M.D. Admit Date:  09/14/2001 Discharge Date: 09/15/2001                             Operative Report  PREOPERATIVE DIAGNOSIS:  Immature cataract, left eye.  POSTOPERATIVE DIAGNOSIS:  Immature cataract, left eye.  PROCEDURE:  Kelman phacoemulsification of cataract, left eye.  ANESTHESIA:  Local using Xylocaine 2% with Marcaine 0.75% and Wydase.  JUSTIFICATION FOR PROCEDURE:  This is a 70 year old lady who has a history of glaucoma and diabetes, who complains of blurring of vision with difficulty seeing to read.  She was evaluated and found to have bilateral cataracts with a visual acuity best corrected at 20/40 on the right, 20/60 on the left. Cataract extraction of the left eye was recommended, and she is admitted at this time for that purpose.  DESCRIPTION OF PROCEDURE:  Under the influence of IV sedation and Van Lint akinesia, a retrobulbar anesthesia was given.  The patient was prepped and draped in the usual manner.  The lid speculum was inserted under the upper and lower lid of the left eye, and a 4-0 silk traction suture was passed through the belly of the superior rectus muscle for traction.  A fornix-based conjunctival flap was turned and hemostasis achieved by using cautery.  An incision made in the sclera approximately 1 mm posterior to the limbus.  This incision was dissected down to clear cornea using the crescent blade.  A side port incision was made at the 1:30 clock hour position.  Occucoat was injected into the eye through the side port incision.  The anterior chamber was then entered through the corneoscleral tunnel incision at the 11:30 position.  An anterior capsulotomy was done using a bent 25-gauge  needle.  The nucleus was hydrodissected using Xylocaine.  The KPE handpiece was passed into the eye, and the nucleus was emulsified without difficulty.  The residual cortical material was aspirated.  The posterior capsule was polished using the olive-tip polisher.  A foldable silicone lens was then seated into the eye behind the iris without difficulty.  The anterior chamber was reformed, and the pupil was constricted using Miochol.  The residual Occucoat was aspirated from the eye.  The lips of the wound were hydrated and tested to make sure that there was no leak.  After ascertaining that there was no wound leak, the conjunctiva was closed over the wounds using thermal cautery.  Celestone 1 cc and 0.5 cc of gentamicin were injected subconjunctivally.  Pilopine ointment and Maxitrol ointment were applied along with a patch and Fox shield.  The patient tolerated the procedure well, was discharged to postanesthesia recovery room in satisfactory condition.  Because this lady lives alone and has a variety of medical problems, she will be admitted for overnight observation an discharged tomorrow if her condition is satisfactory. DISCHARGE DIAGNOSIS:  Immature cataract, left eye. Dictated by:   Marya Landry Carlyle Lipa., M.D. Attending Physician:  Karenann Cai DD:  09/14/01 TD:  09/16/01 Job: [id number]

## 2011-02-12 NOTE — Cardiovascular Report (Signed)
NAME:  Christine Gay, Christine Gay                        ACCOUNT NO.:  [id number]   MEDICAL RECORD NO.:  [id number]                   PATIENT TYPE:  INP   LOCATION:  4704                                 FACILITY:  [facility]   PHYSICIAN:  Arturo Morton. Riley Kill, M.D. [facility]         DATE OF BIRTH:  03/20/1941   DATE OF PROCEDURE:  12/06/2002  DATE OF DISCHARGE:                              CARDIAC CATHETERIZATION   INDICATIONS FOR PROCEDURE:  The patient is well known to me.  She has had  prior revascularization surgery.  Last fall, we stented the second obtuse  marginal branch through the vein graft.  The first vein graft has been  stented previously and is known to be occluded.  She has a fair amount of  distal disease.  The current study was done to assess coronary anatomy after  she presented with prolonged chest pain, and was noted to have positive  enzymes for a non-ST elevation myocardial infarction.   PROCEDURE:  1. Left heart catheterization.  2. Selective coronary arteriography.  3. Selective left ventriculography.  4. Saphenous vein graft angiography x1.  5. Selective left internal mammary angiography x1.   DESCRIPTION OF PROCEDURE:  The patient was brought to the catheterization  lab and prepped and draped in the usual fashion.  Both groins were prepped.  We used a Smart needle to gain access to the right femoral artery, and we  elected to use 5-French catheters.  Overall, she tolerated the procedure  without complication.  I had Dr. Dorethea Clan come in and review the films with me  in detail.  We elected to treat her medically.   HEMODYNAMIC DATA:  1. Central aortic pressure 155/82 with a mean of 109.  2. Left ventricular pressure 169/24.  3. There was no gradient on pullback across the aortic valve.   ANGIOGRAPHIC DATA:  1. Ventriculography was performed in the RAO projection.  There was a mid     inferior area of severe hypokinesis, accompanied by 1+ mitral     regurgitation.   Calculation of ejection fraction by myself was 62%.  2. The left main coronary artery was free of critical disease.  3. The left anterior descending artery was pretty severely diseased before     the origin of 2 septal perforators, then was basically occluded after     that.  4. The internal mammary to the distal LAD was widely patent.  The distal LAD     itself had about 40% to 50% narrowing just beyond the graft insertion,     fairly similar to previous studies.  There is slight haziness at this     location.  There is also an apical stenosis just prior to the     bifurcation.  This is long and segmental.  5. The circumflex provides a first marginal branch that basically is totally     occluded.  The AV circumflex then has about a 70%  to 75% eccentric plaque     right at its takeoff from the main circumflex vessel.  This is fairly     similar also to what has been seen in the past.  This provides a smaller     marginal branch and then also 2 posterolaterals and a probable posterior     descending branch.  6. The right coronary artery itself is totally occluded and severely     diseased proximally.  There are some faint bridging collaterals to the     distal vessel.  7. The saphenous vein graft to the obtuse marginal 1 is noted to be     occluded.  8. The saphenous vein graft to the obtuse marginal 2 is patent, as on     previous studies.  Beyond the graft insertion, there is about 40%     narrowing followed by a segmental area of slight haziness and 40% to 50%     narrowing.  At the previous stent site, there is about 30% narrowing,     then this vessel trifurcates distally.  There is a small inferior branch     that was previously occluded during the procedure that has about 70% to     80% stenosis.   CONCLUSIONS:  1. Continued patency of the internal mammary to the left anterior descending     artery with a moderately diseased distal left anterior descending artery.  2. Continued  patency of the saphenous vein graft to the obtuse marginal 2     with continued patency of the stent site with fair amount of diffuse     luminal irregularities throughout the obtuse marginal 2.  3. A 70% to 75% stenosis of the AV circumflex at its takeoff in a sharp bend     from the true circumflex without high-grade change from the previous     study.  4. Total occlusion of the right coronary artery.  5. Known total chronic occlusion of the obtuse marginal graft.   DISPOSITION:  Her LV function remains preserved.  Why she presented with a  non-ST elevation is unclear to me.  I have reviewed the films with Dr.  Dorethea Clan.  We potentially could dilate the AV circumflex and put a stent  across the takeoff of the first OM1; however, it is not clear that this is  the definite source, and at the present time, I would likely continue  medical therapy unless she develops recurrent symptoms as I am not entirely  convinced this is the source of ischemia.                                               Arturo Morton. Riley Kill, M.D. [facility]    TDS/MEDQ  D:  12/06/2002  T:  12/07/2002  Job:  [id number]   cc:   Lilla Shook, M.D.  [address], [address]  [place]  [place]  [address]  Fax: [phone]   Cardiac Catheterization Lab

## 2011-02-12 NOTE — Cardiovascular Report (Signed)
[place]  Patient:    Christine Gay, Christine Gay                     MRN: [id number] Proc. Date: 04/05/00 Adm. Date:  [id number] Attending:  Ronaldo Miyamoto CC:         Arturo Morton. Riley Kill, M.D. [facility]             Modesta Messing, M.D.             CV Laboratory                        Cardiac Catheterization  INDICATIONS:  Harriott is well known to me.  She is a 70 year old female who has had prior revascularization surgery.  She has presented with unstable angina.  She has had prior stenting of the saphenous vein graft to the OM-1. The current study was done to access coronary anatomy.  PROCEDURES: 1. Left heart catheterization. 2. Selective coronary arteriography. 3. Selective left ventriculography. 4. Saphenous vein graft angiography. 5. Selective left internal mammary angiography.  DESCRIPTION OF PROCEDURE:  The procedure was performed from the right femoral artery using 6 French catheters.  She tolerated the procedure without complication.  There were no significant problems.  She was taken to the holding area in satisfactory clinical condition.  HEMODYNAMICS:  The central aortic pressure was 142/79.  LV pressure 152/21. There was no gradient on pullback across the aortic valve.  ANGIOGRAPHIC DATA:  The left main coronary artery was free of critical disease.  The LAD has multiple areas of 90% disease culminating in total occlusion. There is a first diagonal branch which has its takeoff which is essentially occluded.  The left internal mammary to the distal LAD is widely patent.  However, there is 80% stenosis beyond the graft insertion then a 90% stenosis more distally. Both of these areas have been previously noted.  The circumflex provides a marginal branch which is totally occluded.  The AV circumflex has about 70% eccentric narrowing that fills the posterior descending system from the circumflex side.  As previously noted the first  and second marginals are occluded.  The distal vessels are widely patent.  There is a saphenous vein graft to the OM-2.  This saphenous vein graft supplies a OM-2 that has some diffuse luminal irregularities but is fairly large covering a substantial portion of the lateral wall.  There is about a diffuse 50% narrowing.  The saphenous vein graft to the OM-1 is known to be occluded.  In addition the previous stents sites are visible on the aortic root shot and compatible with an occluded graft.  The right coronary artery is totally occluded.  As previously noted, this is a nondominant vessel.  LEFT VENTRICULOGRAPHY:  Ventriculography in the RAO projection reveals ejection fraction of at least 55%.  There is inferobasal hypokinesis.  CONCLUSIONS: 1. Preserved overall LV function with a very mild inferobasal hypokinesis. 2. Continued patency of the internal mammary graft to a distal LAD with    diffuse distal LAD disease. 3. Continued patency of saphenous vein graft to the OM-2. 4. Continued occlusion of the saphenous vein graft to the OM-1. 5. Continue AV circumflex disease supplying a dominant distal circumflex    vessel.  DISPOSITION:  We will likely continue to treat the patient medically.  There is perhaps some progression of disease in the AV circumflex and this clearly does supply a distal area.  However, I would probably op for medical management as the remainder of the vessel is not ideal for percutaneous intervention.  We may even consider EECP. DD:  04/05/00 TD:  04/06/00 Job: [id number]

## 2011-02-12 NOTE — Op Note (Signed)
NAMEJOANMARIE, Christine Gay              ACCOUNT NO.:  [id number]   MEDICAL RECORD NO.:  [id number]          PATIENT TYPE:  OIB   LOCATION:  5732                         FACILITY:  [facility]   PHYSICIAN:  Salley Scarlet., M.D.DATE OF BIRTH:  [birth year]   DATE OF PROCEDURE:  DATE OF DISCHARGE:  12/08/2004                                 OPERATIVE REPORT   PREOPERATIVE DIAGNOSIS:  Immature cataract, right eye.   POSTOPERATIVE DIAGNOSIS:  Immature cataract, right eye.   OPERATION:  Kelman phacoemulsification, cataract, right eye.   ANESTHESIA:  Local using Xylocaine 2% with Marcaine 0.75% and Wydase.   INDICATIONS FOR PROCEDURE:  This is a 70 year old diabetic lady who has  undergone a cataract extraction from the left eye but who now complains of  the inability to see from the right eye.  She was evaluated and found to  have a visual acuity that is corrected to 20/200 on the right and 20/50 on  the left.  There was a dense posterior subcapsular cataract present on the  right with an intraocular lens present on the left.  Cataract extraction  with intraocular lens implantation of the right eye was recommended.  She is  admitted at this time for that purpose.   PROCEDURE:  Under the influence of IV sedation, Van Lint akinesia and  retrobulbar anesthesia was given.  The patient was prepped and draped in the  usual manner.  The lid speculum was inserted under the upper and lower lid  of the left eye and a 4-0 silk traction suture was passed through the belly  of the superior rectus muscle for traction.  A fornix-based conjunctival  flap was turned, and hemostasis was achieved using cautery.  An incision was  made in the sclera at the limbus and dissected down to clear cornea using a  crescent blade.  A side-port incision was made at the 1:30 o'clock position.  OcuCoat was injected into eye through the side-port incision.  The anterior  chamber was then entered through the corneoscleral  tunnel incision at the  11:30 o'clock position using a 2.75 mm keratome.  An anterior capsulotomy  was done using a bent 25-gauge needle.  The nucleus was hydrodissected using  Xylocaine.  The KPE handpiece was fastened into the eye and the nucleus was  emulsified without difficulty.  The residual cortical material was  aspirated.  The posterior capsule was polished using an olive-tip polisher.  The wound was lanced slightly to accommodate a foldable silicone lens.  This  lens was seated into the eye behind the iris without difficulty.  The  anterior chamber was reformed, and the pupils were constricted using  Miochol.  The residual OcuCoat was aspirated from the eye.  The lips of the  wound were hydrated and tested to make sure that there was no leak.  The  conjunctiva was then closed over the wound using thermocautery.  Celestone 1  mL and 0.5% gentamicin were injected subconjunctivally.  Maxitrol ophthalmic  ointment and Pilopine ointment were applied along with a patch and a Fox  shield.  The patient tolerated the  procedure well and was discharged to the  post anesthesia care unit in satisfactory condition.  Because she has a  variety of medical conditions, including heart disease, diabetes, and  chronic obstructive lung disease, and because she lives alone, she will be  admitted for overnight observation and discharged tomorrow if her condition  is satisfactory.   DISCHARGE DIAGNOSIS:  Immature cataract, right eye.      TB/MEDQ  D:  12/08/2004  T:  12/08/2004  Job:  [id number]

## 2011-02-12 NOTE — Discharge Summary (Signed)
NAME:  Christine Gay, Christine Gay                        ACCOUNT NO.:  [id number]   MEDICAL RECORD NO.:  [id number]                   PATIENT TYPE:  INP   LOCATION:  4704                                 FACILITY:  [facility]   PHYSICIAN:  Lilla Shook, M.D.            DATE OF BIRTH:  Oct 05, 1940   DATE OF ADMISSION:  12/05/2002  DATE OF DISCHARGE:  12/09/2002                                 DISCHARGE SUMMARY   DIAGNOSES:  1. Non-Q-wave myocardial infarction with history of known cardiac disease.     CK 202, MB 9.9, MB index 4.9, and peak troponin 0.97.  2. Coronary artery disease with last catheterization January 2004, showing     normal left main, left anterior descending with proximal 95% stenosis     albeit a 100% occlusion with mid and distal.  The vessel appeared to be     of a left internal mammary artery with a 40% stenosis at the insertion of     the left internal mammary artery.  Also a 40-50% apical stenosis.     Circumflex in the arteriovenous groove had 70% stenosis.  Also other     disease including a completely occluded right coronary artery.  It was     decided that she should be managed medically.  Evidently she had numerous     caths in the past.  Her cardiologist is Dr. Shawnie Pons.  3. Diabetes mellitus.  4. Obesity.  5. Hypertension.  6. Dyslipidemia.  7. Hypothyroidism.  8. Sickle cell trait.  9. Gastroesophageal reflux disease.  10.      Mitral regurgitation - 1+.  11.      Chronic left bundle branch block.  12.      Status post coronary artery bypass grafting.   DISCHARGE MEDICATIONS:  1. Metoprolol 25 mg b.i.d.  2. Imdur 60 mg, 2 pills twice a day (new).  3. Altace 2.5 mg daily (new).  4. Enteric-coated aspirin 325 mg p.o. daily.  5. Dilantin 200 mg p.o. b.i.d.  6. Lipitor 80 mg p.o. q.p.m.  7. Prevacid 30 mg p.o. daily.  8. Folic acid 1 mg p.o. daily.  9. Norvasc 10 mg p.o. daily.  10.      Synthroid 100 mcg p.o. daily.  11.      Eyedrops from home.  12.      Sublingual nitroglycerin p.r.n. chest pain.  13.      Plavix 75 mg p.o. daily.   CHIEF COMPLAINT:  Chest pain.   HISTORY OF PRESENT ILLNESS:  Christine Gay is a 70 year old woman with known  coronary disease and is status post a CABG with several subsequent  catheterizations and frequent episodes of chest pain who presented with  chest pain starting the night prior to admission.  It continued  intermittently through the night unlike her usual anginal symptoms.  It was  a pain in her left chest that was radiating to  her left neck, left elbow,  and she was mildly short of breath with it.  Sublingual nitroglycerin gave  partial relief.  She came to the emergency room early on the morning of  admission and received intravenous nitroglycerin with resolution of the pain  but returned after discontinuation of the intravenous medication.  She had a  stent placed in the late 1990s in addition to her CABG in 1992 and has had a  history of occlusion of the stent.  She had been admitted in January 2004  with chest pain and again had a catheterization at that time.  It was  decided that she would be managed medically at that point.  According to Ms.  Pall, it was decided that she probably would never be catheterized again  because of the frequency and that there was nothing that could be done.   ALLERGIES:  DEMEROL, NITROGLYCERIN PATCH, and MORPHINE.   SIGNIFICANT DATA:  VITAL SIGNS:  Blood pressure 166/72 with heart rate of 76  and temperature 98.6.  Pulse oximetry 98% on room air.  LUNGS:  Clear bilaterally.  HEART:  With normal S1 and S2.  No murmurs and no bruits.  EXTREMITIES:  She had no lower extremity edema.   LABORATORY DATA:  Hemoglobin 12.9.  Glucose 233.  White blood count 7.9.  BUN 10, creatinine 1.  CPK initially 142, with initial troponin 0.02.   Chest x-ray showed cardiomyopathy with mild vascular congestion.   EKG showed sinus rhythm with a left bundle branch block.    HOSPITAL COURSE:  Ms. Niblack was admitted for unstable angina and treated  with intravenous nitroglycerin, Lovenox, aspirin, and Plavix was added.  Cardiac enzymes were obtained and she ruled in for a non-ST-elevation MI.  Cardiology saw her with Dr. Riley Kill was consulted, and it was decided that  she was to receive a cardiac catheterization.  She had a catheterization on  December 06, 2002, showing continued patency of the IMA to the LAD with a  moderately diffuse distal LAD.  Also patency of the SVT to the OM 2,  continued patency of the stent site, and a fair amount of diffuse luminal  irregularity throughout the OM 2.  There was a 70-75% stenosis of the AV  circumflex at its takeoff at a sharp bend from the true circumflex without  high-grade change from the previous study.  Total occlusion of the RCA.  No  total occlusion of the obtuse marginal graft.  Overall, her LV function was  preserved, and there was no definite source of her non-ST elevation MI.  It  was decided that we would continue with medical therapy unless she develops  recurrent symptoms.  __________ very much to do to help her except for  continuing the Plavix and adding nitrates.   The intravenous nitroglycerin was finally discontinued, and the Imdur was  increased gradually to 120 mg twice a day.  She also was to get cardiac  rehabilitation.  She seemed to do well, with decrease in her chest pain.  She did have two episodes of chest pain before discharge which were relieved  with one sublingual nitroglycerin and with __________ was started prior to  discharge.  Her diabetes remained stable overall, and potassium chloride was  used to replete a low potassium level.   DISCHARGE CONDITION:  Stable and improved, without chest pains.   CONSULTATIONS:  Dr. Shawnie Pons, cardiology.    DISCHARGE INSTRUCTIONS:  Christine Gay was to follow her new medication  regimen.  She was to continuing following a moderate carbohydrate  diabetic  diet with low salt.  She was to follow up with me in one week in the office  as well as with Dr. Riley Kill for any cardiac problems and for regular follow-  up.                                               Lilla Shook, M.D.    SEJ/MEDQ  D:  02/27/2003  T:  02/28/2003  Job:  [id number]

## 2011-02-12 NOTE — Cardiovascular Report (Signed)
NAME:  Christine Gay, Christine Gay                        ACCOUNT NO.:  [id number]   MEDICAL RECORD NO.:  [id number]                   PATIENT TYPE:  INP   LOCATION:  3705                                 FACILITY:  [facility]   PHYSICIAN:  Arturo Morton. Riley Kill, M.D. [facility]         DATE OF BIRTH:  August 10, 1941   DATE OF PROCEDURE:  07/06/2002  DATE OF DISCHARGE:                              CARDIAC CATHETERIZATION   INDICATIONS:  The patient is well known to me.  She is a 70 year old who  previously underwent revascularization surgery.  This was done in 1992 and  she subsequently had a stent placed to the OM-1 graft.  She subsequently  developed occlusion of that graft.  She most recently has presented with  recurrent angina pectoris and the current study was done to assess coronary  anatomy.   PROCEDURES:  1. Left heart catheterization.  2. Selective coronary arteriography.  3. Selective left ventriculography.  4. Distal aortography.  5. Saphenous vein graft angiography x1.  6. Selective left internal mammary angiography.   DESCRIPTION OF PROCEDURE:  We initially attempted to perform the procedure  from the right femoral artery.  We had rather significant difficulty getting  into the right femoral artery.  Attention was then shifted to the left  femoral artery, and using a SmartNeedle we were able to eventually gain  access.  The patient had a fair amount of hip pain in the left hip  throughout the course of the procedure, and we removed the sheath on the  table at the completion of the procedure.  Her hip pain was actually  relieved when getting off the table onto the  bed.  She was subsequently taken to the holding area in satisfactory  clinical condition.   HEMODYNAMIC DATA:  1. Central aorta 174/97.  2. Left ventricular 151/16/28.  3. No gradient on pullback across the aortic valve.   ANGIOGRAPHIC DATA:  1. The left main coronary artery is free of critical disease.  2. The left anterior  descending artery is totally occluded after a second     septal perforator and diagonal prior to all of this.  There is about an     80% area of narrowing and the LAD itself is diffusely disease.  3. The left internal mammary to the distal left anterior descending artery     is widely patent.  The distal LAD after the graft insertion demonstrates     about a 50% area of focal narrowing followed by a second 50% narrowing,     following by a 70% stenosis near the apex.  4. The circumflex is a dominant vessel.  There is a first marginal branch     which is totally occluded.  The saphenous vein graft to the first     marginal branch is also totally occluded.  The AV circumflex has about     70% narrowing and then there is a total occlusion  of the second marginal     branch to which the vein graft is patent.  The remainder of this vessel     supplies the inferior and inferolateral segment.  5. The right coronary artery is totally occluded.  It is nondominant.  6. The vein graft to the OM-2 is intact.  However, there is a fair amount of     irregularity distal to the graft insertion and a 90% stenosis.  Following     this, the vessel opens up.   VENTRICULOGRAPHY:  Ventriculography in the RAO projection reveals mild  inferobasal hypokinesis.  Ejection fraction is in excess of 50%.   DISTAL AORTOGRAM:  Distal aortography demonstrates a relatively smooth  appearing aorta.  It is difficult to visualize the renal arteries.  The  iliac arteries appear relatively smooth as well.   CONCLUSION:  1. Preserved overall left ventricular function.  2. Continued patency of the internal mammary to the left anterior descending     with diffuse distal disease as previously noted.  3. Chronic total occlusion of the saphenous vein graft to the obtuse     marginal #1.  4. Patent saphenous vein graft to the obtuse marginal #2 with a high-grade     disease in the obtuse marginal #2 beyond the graft insertion.   5. Patent iliac arteries.   DISPOSITION:  The exact best approach to this is unclear.  Because of her  size, I am concerned about re-do surgery.  We could dilate the native vessel  beyond the graft insertion, although there is some segmental disease. I will  review the options with my colleagues and subsequently discuss the options  with the patient and her family.                                                       Arturo Morton. Riley Kill, M.D. [facility]    TDS/MEDQ  D:  07/06/2002  T:  07/09/2002  Job:  [id number]   cc:   CV Laboratory   Lilla Shook, M.D.  [address], [address]  [place]  [place]  [address]  Fax: [phone]

## 2011-02-12 NOTE — Consult Note (Signed)
NAME:  Christine Gay, Christine Gay                        ACCOUNT NO.:  [id number]   MEDICAL RECORD NO.:  [id number]                   PATIENT TYPE:  INP   LOCATION:  4704                                 FACILITY:  [facility]   PHYSICIAN:  Rollene Rotunda, M.D. [facility]            DATE OF BIRTH:  30-Nov-1940   DATE OF CONSULTATION:  12/05/2002  DATE OF DISCHARGE:                                   CONSULTATION   REASON FOR CONSULTATION:  Evaluate patient with chest pain.   HISTORY OF PRESENT ILLNESS:  The patient is a very pleasant 70 year old  African-American female with a history of coronary disease as described  below.  She has had occasional chest discomfort particularly with exertion  since her January catheterization, however, last night at 9 p.m. she  developed resting chest discomfort.  It was similar to previous angina.  It  was 7 out of 10 in intensity.  It was substernal and hard.  It radiated to  her neck and down into her left arm.  She took some nitroglycerin at home  with improvement.  However, because of the intensity and persistence of the  pain she presented to the emergency room.  There she was noted to have  chronic left bundle branch block.  Her initial enzymes were not elevated.  She had relief of her pain with IV nitroglycerin and IV Stadol.  She however  had recurrence of her discomfort this afternoon.  Her enzymes are elevated  now with a troponin of 0.49.   PAST MEDICAL HISTORY:  Coronary artery disease (last catheterization 1/04,  left main normal, LAD proximal 95% stenosis, followed by a 100% occlusion,  the mid and distal vessel appears to be of a LIMA, there is 40% stenosis at  the insertion of the LIMA, and 40-50% apical stenosis, circumflex in the A-V  groove has 70% stenosis.  A mid obtuse marginal is occluded and noted to  perfuse the vein graft.  At the insertion the vein graft there is a 50%  stenosis followed by a stented region or in-stent 20% stenosis with a side  branch emerging from the stented area that has 80% stenosis.  The right  coronary artery is occluded.  There is an occluded vein graft to OM-1.  The  LIMA to the LAD and the marginal are patent and free of disease.  It was  decided the patient be managed medically.)  Diabetes mellitus, obesity,  hypertension, dyslipidemia, hypothyroidism, glaucoma, sickle cell carrier,  gastroesophageal reflux disease, seizures, chronic left bundle branch block,  1+ mitral regurgitation.   PAST SURGICAL HISTORY:  CABG, bilateral tubal ligation, cholecystectomy.   ALLERGIES:  Demerol, Morphine, and Nitroglycerin patch/paste.   MEDICATIONS:  Lopressor 25 mg t.i.d., Synthroid 0.1 mg daily, Norvasc 10 mg  daily, Prevacid 30 mg daily, Lipitor 80 mg daily, aspirin 325 mg daily,  folic acid, calcium, Dilantin 200 mg b.i.d., Imdur 120 mg b.i.d.,  multivitamin.   SOCIAL HISTORY:  The patient lives in [place] with her husband.  She does  not smoke cigarettes.  She tries to walk for exercise.   FAMILY HISTORY:  Her mother died from complications of diabetes at age 54.  Otherwise there does not appear to be in early coronary disease history.   REVIEW OF SYSTEMS:  As stated in the HPI and positive for occasional  arthralgias, reflux symptoms unlike her current chest pain.  Otherwise  negative for all other systems.   PHYSICAL EXAMINATION:  GENERAL: The patient is pleasant and in no distress.  VITAL SIGNS: Blood pressure 151/80, heart rate 57 and regular, afebrile.  HEENT: Eyes unremarkable, pupils equal, round and react to light, fundi not  visualized.  Oropharynx unremarkable.  NECK: No jugular venous distension, wave form within normal limits, carotid  upstrokes brisk and symmetric, no bruits, no thyromegaly.  LYMPHATICS: No cervical, axillary, inguinal adenopathy.  LUNGS: Clear to auscultation bilaterally.  BACK: No thoracic vertebral angle tenderness.  CHEST: Chest wall sternotomy scar.  HEART: The PMI  is not displaced or sustained, S1 and S2 are within normal  limits, no S3, no S4, no murmurs.  ABDOMEN: Obese, positive bowel sounds, normal frequency, no pitched sounds,  bruits, rebound, guarding in the midline positive.  No hepatomegaly, no  splenomegaly.  SKIN: No rash.  EXTREMITIES:  Pulses 2+ throughout, no edema.  NEUROLOGIC: Oriented to person, place and time, Cranial nerves II-XII  grossly intact, motor grossly intact.  EKG: sinus rhythm, rate 60, left bundle branch block.   LABORATORY DATA:  Hemoglobin 12.9, WBC 7900, platelets 233,000.  Sodium 142,  potassium 3.4, BUN 10, creatinine 0.8, CK 142, MB 1.4, 185/9.8, troponin  0.02, 0.49.  Chest x-ray: cardiomegaly, mild vascular congestion.   ASSESSMENT AND PLAN:  1. The patient has recurrent unstable angina with enzyme elevations.  This     is refractory despite good outpatient medical regimen.  She has also had     chest pain since hospitalization on IV nitroglycerin.  I will review the     catheterization films and discuss with Dr. Tedra Senegal.  However, I do not see     a way around repeat catheterization to look for any macrovascular disease     that could be treated with further percutaneous intervention.  Other     alternatives, if revascularization is not an option, might include EECP.  2. Hyperlipidemia. She will continue the medications as listed.  3. Diabetes, this will be per primary care physician.                                                  Rollene Rotunda, M.D. [facility]    JH/MEDQ  D:  12/05/2002  T:  12/06/2002  Job:  [id number]   cc:   Lilla Shook, M.D.  [address], [address]  [place]  [place]  [address]  Fax: [phone]

## 2011-02-12 NOTE — Cardiovascular Report (Signed)
NAME:  Christine Gay, Christine Gay                        ACCOUNT NO.:  [id number]   MEDICAL RECORD NO.:  [id number]                   PATIENT TYPE:  INP   LOCATION:  3705                                 FACILITY:  [facility]   PHYSICIAN:  Arturo Morton. Riley Kill, M.D. [facility]         DATE OF BIRTH:  [birth year]   DATE OF PROCEDURE:  07/09/2002  DATE OF DISCHARGE:                              CARDIAC CATHETERIZATION   INDICATIONS:  The patient is a 70 year old female, well known to me.  She  presented with about 2 to 3 weeks of progressive anginal type chest pain.  She has had long-standing angina, but it has been progressive during this  period of time. She was subsequently admitted to the hospital and underwent  catheterization, which demonstrated patency of the internal mammary to the  LAD with some diffuse distal disease, and also patency of the saphenous vein  graft to the OM, but with some more segmental disease.  The saphenous vein  graft to the OM-1 was occluded.  We discussed the various options and I did  not feel she was a good candidate for re-do bypass.  First, the vessel  distal to the lesion was somewhat diffusely diseased and it would need to be  redone beyond that.  Secondly, the AV circumflex which supplies the inferior  myocardial segment is distal and somewhat small in caliber. With a patent  internal mammary it was felt that either medical therapy or attempt at  intervention was probably the best option.  I reviewed the films with my  colleagues and it was our feeling that we could offer attempt at PTCA of the  mid circumflex. I discussed the options with the patient and subsequently  her husband, and the patient was agreeable to proceed.   PROCEDURE:  Percutaneous stenting of the obtuse marginal #2 through a  saphenous vein graft.   COMPLICATIONS:  Side branch occlusion.   DESCRIPTION OF PROCEDURE:  The patient was brought to the catheterization  lab and prepped and draped in the  usual fashion.  Using a SmartNeedle, the  left femoral artery was cannulated successfully.  The patient had quite a  bit of back pain as well as some hip pain during the course of the  procedure, and generous doses of intravenous Nubain were administered.  Angiomax was given according to protocol, and the patient had pretreatment  with Plavix. The saphenous vein graft to the OM-2 was engaged with a left  bypass graft catheter and there was excellent seating.  With the ACT in  excess of 300 seconds, a BMW wire was then placed down in the obtuse  marginal.  We initially passed a 2.5 mm balloon, and dilatation with this  resulted in inability to open the lesion.  We then took a 2.25 mm 15 mm  length balloon, and this likewise resulted in inability to fully expand the  lesion. We then downgraded to a 2 mm  balloon and with fairly high pressures,  we were able to fully expand the lesion.  We had initially tried to cross  with a Quantum Maverick balloon, but this was not able to cross; as a result  we elected not to use a Cutting Balloon. With this, the vessel was opened  and so we upgraded back to the 2.5 mm balloon and 3 minute inflations were  done.  At this point, there was moderate continued narrowing at the lesion  site of about 60% although improved from the initial study.  There was also  slight haziness at this site and I brought Dr. Juanda Chance into the room so he  and I reviewed the films in detail.  We discussed the possible options with  regard to stenting.  We considered a 2.5 mm Cypher stent, but I was  concerned to some extent about both inflow and outflow in this lesion, and  also the length of the stent with coverage of at least two side branches.  I  elected to use a 2.5 mm x 8 Pixel and we were able to get this down the  artery with a fair amount of pressure on the guide and wire.  This was then  placed across the lesion.  This was fully inflated to 11 atmospheres.  This  resulted  in an excellent angiographic appearance at the lesion site but with  side branch loss. With this, the patient had some chest pain.  We attempted  to probe the side branch but were unable to get a wire down the artery, and  likewise intracoronary nitroglycerin was administered in an attempt to see  if we could reestablish flow.  The patient's pain subsequently improved but  as noted there was some evidence of side branch occlusion. All catheters  were then subsequently removed, the femoral sheath sewn into place.  I  brought her husband into the actual catheterization lab suite and reviewed  the films with him, although his level of understanding is limited.  The  patient was taken to CCU in satisfactory clinical condition.   ANGIOGRAPHIC DATA:  The saphenous vein graft to the OM-2 is widely patent.  Beyond this, there is some irregularity of perhaps 30% in a bend.  Beyond  this, there was a 90-95% hazy stenosis just prior to a bifurcation point.  Beyond the bifurcation point, there was a second bifurcation.  Following  stenting of this lesion, there was reduction to 0% residual luminal  narrowing with actually a -10% stenosis.  There was loss of the smaller  inferior sub-branch. Runoff into the main channel was brisk with TIMI-3  flow.  Likewise, a superior sub-branch remained patent.   CONCLUSION:  Successful percutaneous stenting of the obtuse marginal #2  through a previously placed saphenous vein graft but with inferior side  branch occlusion.   DISPOSITION:  The patient will be put in the coronary care unit to be  treated with aspirin and Plavix.                                                      Arturo Morton. Riley Kill, M.D. [facility]    TDS/MEDQ  D:  07/09/2002  T:  07/10/2002  Job:  [id number]   cc:   [facility]

## 2011-02-12 NOTE — Assessment & Plan Note (Signed)
[facility]                              CARDIOLOGY OFFICE NOTE   HAIDEE, STOGSDILL                     MRN:          [id number]  DATE:05/18/2006                            DOB:          17-Nov-1940    Ms. Cafarelli is in for a followup visit.  She has clinically been stable.  She  has had rare chest pain, for which she has taken nitroglycerin and had  immediate relief.  She did have a CT scan back in May, and we are trying to  get the results of this.  She has also had lab work done at Dr. Bartholomew Crews  office recently.   PHYSICAL EXAMINATION:  On physical today:  VITAL SIGNS:  Blood pressure 134/76, pulse 65.  CHEST:  Lung fields are clear.  Paradoxical splitting in the second heart  sound.  Point of maximal impulse nondisplaced.  EXTREMITIES:  Trace edema.  Median sternotomy and distal incisions are well  healed.   LABORATORY DATA:  EKG revealed normal sinus rhythm with left bundle branch  block and occasional PVC.   IMPRESSION:  1. Coronary disease status post coronary bypass graft surgery in 1992.  2. Hypercholesterolemia.  3. Chronic seizure disorder.  4. Hypertension.   PLAN:  1. Continue medical regimen.  2. Return to clinic in 6 months.  3. Obtain results of CT and blood work.                              Arturo Morton. Riley Kill, MD, [facility]    TDS/MedQ  DD:  05/18/2006  DT:  05/19/2006  Job #:  [id number]

## 2011-02-12 NOTE — Consult Note (Signed)
NAMEJANNAE, Christine Gay             ACCOUNT NO.:  [id number]   MEDICAL RECORD NO.:  [id number]          PATIENT TYPE:   LOCATION:                                 FACILITY:   PHYSICIAN:  Jonelle Sports. Sevier, M.D.      DATE OF BIRTH:   DATE OF CONSULTATION:  12/01/2005  DATE OF DISCHARGE:                                   CONSULTATION   HISTORY:  This 70 year old black female was seen at the courtesy of Dr.  Olena Leatherwood for evaluation of painful calluses, particular on the right foot.   The patient has had mild diabetes of which she has aware for several years  but has been managed solely with diet and exercise with a recent hemoglobin  A1c of 5.9%.   She has had no recognized complications for diabetes but does indeed have  coronary artery disease and has had triple bypass surgeries some 15 years  ago.   She developed several months ago a painful callus underlying the fifth  metatarsal head on the left foot. This has become progressively enlarged and  uncomfortable and she saw Dr. Olena Leatherwood for this purpose. Dr. Olena Leatherwood repaired  the lesion somewhat and then sent her here for our further evaluation and  advice.   It is noted that the patient does not have diabetes inserts or proper foot  wear but that she anticipates getting this upon her enrollment in Medicare  which is forthcoming in less than 30 days.   PAST MEDICAL HISTORY:  The patient has primary hypothyroidism and a  controlled seizure disorder as well as her diabetes and coronary disease as  mentioned. She is a nonsmoker and denies use of alcoholic beverage. She is  allergic to DEMEROL, NITROGLYCERIN PATCH and MORPHINE. The details of these  reactions are unknown. Her regular medications include Lipitor, Dilantin,  isosorbide mononitrate, metoprolol, Synthroid, Altace, Prevacid, Zetia,  Norvasc, baby aspirin and two types of eye drops.   EXAMINATION:  Examination today is limited to the lower extremities. Both  feet are at  rest in a slight varus position but are otherwise free of any  gross deformity. Both were mildly edematous. All pulses are palpable and  adequate. Monofilament testing shows that she has protective sensation  throughout both feet.   On the plantar aspects of both feet are calluses at the fifth metatarsal  head area. This one is particular large and somewhat tender on the left  foot. There is no evidence of deeper surrounding infection and no evidence  that the lesion has drained.   IMPRESSION:  A preulcerative callus plantar left fifth metatarsal head area.   DISPOSITION:  1.  The importance of getting the proper shoes and inserts it is explained      to the patient in some detail. Further she understands that with this      degree of callus formation if it cannot be prevented from rapidly      recurring simply through the use of custom inserts, there might be some      wisdom in either resecting a portion of the metatarsal head or  if there      should be a sesamoid bone there resecting it. This would be an option      only after she had failed to have significant improvement in preventive      effect from her custom inserts.  2.  The calluses underlying the fifth metatarsal heads on both feet are      sharply pared today without incident. No medication is applied and no      dressings were required.  3.  Again, it is discussed with the patient that she should proceed with the      custom diabetes inserts as soon as her insurance situation will allow.  4.  Meanwhile, she is return to Dr. Olena Leatherwood for ongoing care.           ______________________________  Jonelle Sports. Cheryll Cockayne, M.D.     RES/MEDQ  D:  12/01/2005  T:  12/01/2005  Job:  [id number]   cc:   Ladell Pier, M.D.  Fax: [phone]

## 2011-02-12 NOTE — Op Note (Signed)
NAMECLOIS, MONTAVON              ACCOUNT NO.:  [id number]   MEDICAL RECORD NO.:  [id number]          PATIENT TYPE:  AMB   LOCATION:  ENDO                         [facility]   PHYSICIAN:  Danise Edge, M.D.   DATE OF BIRTH:  05-19-1941   DATE OF PROCEDURE:  04/05/2005  DATE OF DISCHARGE:                                 OPERATIVE REPORT   PROCEDURE INDICATIONS:  Ms. Christine Gay is a 70 year old female born  04-14-1941. Ms. Christine Gay is scheduled to undergo her first screening  colonoscopy with polypectomy to prevent colon cancer. In February 2001, she  underwent a flexible proctosigmoidoscopy and air contrast barium enema. Her  air contrast barium enema was reported as being normal.   ENDOSCOPIST:  Danise Edge, M.D.   PREMEDICATION:  Versed 5 mg, fentanyl 50 mcg.   PROCEDURE:  After obtaining informed consent, Christine Gay was placed in the  left lateral decubitus position. I administered intravenous fentanyl and  intravenous Versed to achieve conscious sedation for the procedure. The  patient's blood pressure, oxygen saturation and cardiac rhythm were  monitored throughout the procedure and documented in the medical record.   Anal inspection and digital rectal exam were normal. The Olympus adjustable  pediatric colonoscope was introduced into the rectum and advanced to the  cecum. Colonic preparation for the exam today was excellent.   Rectum normal. Retroflex view of the distal rectum normal.  Sigmoid colon and descending colon normal.  Splenic flexure normal.  Transverse colon normal.  Hepatic flexure normal.  Ascending colon normal.  Cecum and ileocecal valve normal.   ASSESSMENT:  Normal screening proctocolonoscopy to the cecum.       MJ/MEDQ  D:  04/05/2005  T:  04/05/2005  Job:  [id number]

## 2011-02-12 NOTE — Discharge Summary (Signed)
NAME:  Christine Gay, Christine Gay                        ACCOUNT NO.:  [id number]   MEDICAL RECORD NO.:  [id number]                   PATIENT TYPE:  INP   LOCATION:  2929                                 FACILITY:  [facility]   PHYSICIAN:  Joellyn Rued, P.A. [facility]              DATE OF BIRTH:  Jan 15, 1941   DATE OF ADMISSION:  07/05/2002  DATE OF DISCHARGE:  07/11/2002                           DISCHARGE SUMMARY - REFERRING   HISTORY OF PRESENT ILLNESS:  The patient is a 70 year old black female who  was admitted on July 05, 2002, secondary to chest discomfort. She said for  the proceeding week she has noticed mild anterior chest tightness post  walking up the stairs with groceries, relieved with rest. She said on the  morning of admission she was awakened at 1 a.m.  with severe sharp anterior  chest discomfort radiating into her throat associated with  shortness of  breath. She said that it was not similar to  her pre bypass presentation.  She did take three sublingual nitroglycerins and most of the discomfort was  relieved, but she had a residual tightness, thus her presentation.   Her history is notable for a three vessel bypass surgery in 1992 by Dr. Nedra Hai.  She had a LIMA to the LAD, saphenous vein graft to the OM2, saphenous vein  graft to the OM1. At her last catheterization in 1999, she had a total  saphenous vein graft to the OM1. On cath on July 2001, she had native  three-  vessel coronary artery disease, LIMA to the  LAD was patent, however, she  had a distal 80/90% lesion post anastomosis. Her saphenous vein graft to the  OM2 was patent. Her saphenous vein graft to the OM1 was occluded which was  felt to be old. She also has a history of hypothyroidism, status post  cholecystectomy, bilateral tubal ligation, sickle cell trait, seizure  disorder, obesity and glaucoma.   LABORATORY DATA:  On July 06, 2002, TSH was low at 0.121. Fasting lipids  on July 06, 2002, showed a total  cholesterol 156, triglycerides 166, HDL  41, LDL 82. Serial of 3, CK isoenzymes and troponins were negative for  myocardial infarction. Hemoglobin A1C was 6.5. Admission sodium 145,  potassium 3.5, BUN 12, creatinine 1.7. Normal LFTs. Albumin was slightly low  at 3.4. Subsequent chemistries were notable for decreased potassium.  Admission PT 14.4, PTT 26. Admission hemoglobin 11.9 and 35.9, normal  indices. Platelets 222, WBCs 7.6. Subsequent chemistries were unremarkable.   A chest x-ray on admission showed cardiomegaly,  no active disease. EKGs  showed sinus bradycardia,  left bundle branch block, normal axis,  nonspecific STT wave changes.   HOSPITAL COURSE:  The patient was admitted to [facility]. It was  noted that the IV team  was unable to start an IV, thus a central line was  placed. Overnight she did have some slight chest discomfort  while bending  over to wash her legs. Enzymes and EKGs ruled her out for an myocardial  infarction. As she was still hypertensive, her medications were adjusted.  Her potassium was supplemented.   On July 06, 2002, she underwent cardiac catheterization by Dr. Riley Kill.  According to his progress noted she had an EF of 50%, inferior hypokinesis.  The LIMA to the LAD  was patent with distal 50/50/70% stenoses post the  anastomosis. The saphenous vein graft to the OM2 was also patent with a  distal 90% lesion post the anastomosis. The saphenous vein graft to the OM  was totally occluded. The RCA was totally occluded. The native circumflex  had a 70% lesion and a 100% lesion on a sidebranch. Dr. Riley Kill felt that  possible angioplasty should be given to the distal OM2.   Post sheath removal and bed rest, the patient's catheterization site was  intact. She did not have any further chest discomfort, but remained  hypertensive. Dr. Riley Kill notes on July 07, 2002, that he did not think  redo bypass with a patent LIMA and a good LV would be  the best option. He  felt  that medical treatment or  PCI was her  best solution.  This was  discussed with the patient and this was planned for.   On May 09, 2002, the patient  performed angioplasty stenting to the  distal OM2 lesion, reducing the 90% lesion to 0%. Dr. Riley Kill noted that  there was a sidebranch which was jailed. However, post procedure  CK total  MBs were negative, did not show any signs of myocardial infarction. Post  sheath removal  and bed rest she was ambulating without difficulty,  catheterization sites were intact. Cardiac rehab assisted with education and  ambulation, and by July 11, 2002, Dr. Riley Kill felt that she could be  discharged to home. It was noted that on July 08, 2002, Dr. Diona Browner  initially decreased her Synthroid from 150 to 75 secondary to her low TSH.  During her admission it was also noted that her triglycerides also remained  slightly elevated, but Dr. Riley Kill did not want to adjust her medications at  this time.   DISCHARGE DIAGNOSES:  1. Unstable angina, status post angioplasty with stenting to the distal OM2     as previously described.  2. Sinus bradycardia.  3. Hypertension.  4. Hyperlipidemia.  5. Hyperthyroidism with a history of hypothyroidism on replacement     hypokalemia, history as previously disposition.   DISCHARGE MEDICATIONS:  1. Dilantin 200 mg b.i.d.  2. Lipitor 80 mg q.h.s.  3. Prevacid 30 mg q.d.  4. Trusopt 1 gtt b.i.d. OU.  5. Alphagan 1 gtt t.i.d. OS.  6. Acular 0.5% 1 gtt q.i.d. OS.  7. Nitroglycerin p.r.n.  8. Her Imdur was decreased to 60 mg  q.d.  9. Her Synthroid was decreased to 0.125 mg q.d.  10.      Her aspirin was increased to 325 mg q.d.  11.      Her Norvasc was increased to 10 mg q.d.  12.      Her Lopressor was decreased to 50 mg 1/2 tablet t.i.d.  13.      She received a new prescription for Plavix 75 mg x 1 month.  DISCHARGE INSTRUCTIONS:  She was advised no lifting, driving, sexual   activity or heavy exertion for two days and gradually increase her activity.  Maintain a low salt, fat, cholesterol diet. If she had any  problems with the  catheterization site she will call. She will need a TSH check  in  approximately six weeks since her Synthroid was adjusted.    FOLLOW UP:  She will see Dr. Uvaldo Rising. on July 25, 2002, at 12:30  p.m. and she was asked to arrange a followup appointment with Dr. Lovell Sheehan.                                                Joellyn Rued, P.A. [facility]    EW/MEDQ  D:  07/11/2002  T:  07/12/2002  Job:  [id number]   cc:   Lilla Shook, M.D.  [address], [address]  [place]  [place]  [address]  Fax: [phone]   Arturo Morton. Riley Kill, M.D. [facility]

## 2011-02-12 NOTE — Discharge Summary (Signed)
[place]  Patient:    Christine Gay, Christine Gay Visit Number: [id number] MRN: [id number]          Service Type: DSU Location: [phone] Attending Physician:  Karenann Cai Dictated by:   Marya Landry Carlyle Lipa., M.D. Admit Date:  09/14/2001 Discharge Date: 09/15/2001                             Discharge Summary  PREOPERATIVE DIAGNOSIS:  Immature cataract, left eye.  POSTOPERATIVE DIAGNOSIS:  Immature cataract, left eye.  PROCEDURE:  Kelman phacoemulsification of cataract, left eye.  ANESTHESIA:  Local using Xylocaine 2% with Marcaine 0.75% and Wydase.  JUSTIFICATION FOR PROCEDURE:  This is a 70 year old lady who is diabetic with glaucoma, who complained of blurring of vision with difficulty seeing to read.  She was found to have bilateral cataracts, worse on the left than the right, with a visual acuity best corrected to 20/40 on the right and 20/60 on the left.  Cataract extraction with intraocular lens implantation was recommended.  HOSPITAL COURSE:  She was taken to the operating room, where a cataract extraction and intraocular lens implantation was done without complications. The patient tolerated the procedure well.  However, the patient lives alone and has no one to give her postoperative care.  Therefore, she was admitted for overnight observation.  Her hospital course was uneventful.  This morning the patient is comfortable, the cornea is clear, the anterior chamber is deep, and the wound is secure.  The intraocular pressure is elevated at 30.  DISCHARGE INSTRUCTIONS:  She is going to be discharged with instructions to refrain from any strenuous activity such as stooping, bending, or lifting.  DISCHARGE MEDICATIONS:  She is to use Pred Forte four times a day, Ciloxan _____ four times a day, Timoptic and Alphagan as directed.  FOLLOW-UP:  She is instructed to see me in the office tomorrow for  further evaluation.  DISCHARGE DIAGNOSIS:  Immature cataract, left eye. Dictated by:   Marya Landry Carlyle Lipa., M.D. Attending Physician:  Karenann Cai DD:  09/15/01 TD:  09/16/01 Job: [id number]

## 2011-02-12 NOTE — Letter (Signed)
Jan 31, 2007    Deirdre Peer. Polite, M.D.  [address]  [place], [place] [address]   RE:  MARCIANNE, OZBUN  MRN:  [id number]  /  DOB:  [birth year]   Dear [name],   I had the pleasure of seeing Makhiya Lamaster in the office today at a  follow-up visit.  She is a very delightful lady that I have followed for  many, many years, and have shared with a variety of [facility] clinicians.  She originally started with Dr. Mosetta Anis in the general medicine  clinic at [facility], and Berton Mount then followed [facility] Land (married to Dr.  Kathlee Nations Trigt) to [place].  We have worked together with her over many  years.  Dejanae last underwent cardiac catheterization in 2004, and at  that time a previously placed vein graft which had been stented was  occluded.  She had a continued patency of this vein graft to the obtuse  marginal, but the obtuse marginal had severe disease.  Likewise, the  internal mammary to the LAD was moderately diseased.  There was 70%  narrowing in an AV circumflex that went posteriorly.   Evadne has continued to get along reasonably well.  She is generally  sedentary, but has been walking on a reasonable basis.  She gets  moderately short of breath, although in careful questioning she said  this is not any worse than it has been.  I have encouraged her to  continue to walk in a very careful manner, and also encouraged her to  increase her activity as the weather improves here.   MEDICATIONS:  1. Calcium citrate.  2. Folic acid.  3. Dilantin 400 mg b.i.d.  4. Norvasc 10 mg daily.  5. Aspirin 81 mg daily.  6. Actonel 35 mg weekly.  7. Synthroid 112 mcg daily.  8. Prevacid 30 mg daily.  9. Altace 5 mg daily.  10.Multivitamin.  11.Metoprolol 50 mg 1/4 tablet p.o. b.i.d.  12.Cosopt 2 times daily.  13.Isosorbide mononitrate 60 mg 2 tablets b.i.d.  14.Vytorin 10/40, 1 daily.   PHYSICAL EXAMINATION:  VITAL SIGNS:  Weight 210 pounds.  This is  actually lower than when I saw her a  year ago.  Blood pressure is  140/80, the pulse is 60.  LUNGS:  The lung fields are clear.  HEART:  There is paradoxical splitting of the second heart sound.  EXTREMITIES:  There is no extremity edema.   The electrocardiogram demonstrates normal sinus rhythm with left bundle  branch block.   Overall, Ms. Heatley is stable.  She is fairly limited in her activity.  I have encouraged her to walk on a regular basis. Because of her  shortness of breath and the fact that she has not been evaluated in the  last few years, I am going to go ahead and get a 2D echocardiogram.  I  have asked her to walk on a regular basis, and when I see her back we  will reassess whether or not this is substantially changed from the  past.  I would have a relatively low threshold for restudying her if  this became necessary.  I appreciate the opportunity of sharing this  nice lady's care.   She has all of her laboratory studies done at your office, and we did  not obtain a cholesterol on her today, but this would be important to  do.  Thanks for allowing me to share in her care.    Sincerely,  Arturo Morton. Riley Kill, MD, [facility]  Electronically Signed    TDS/MedQ  DD: 01/31/2007  DT: 01/31/2007  Job #: [id number]

## 2011-02-17 ENCOUNTER — Encounter: Payer: Self-pay | Admitting: Cardiology

## 2011-03-23 ENCOUNTER — Other Ambulatory Visit: Payer: Self-pay | Admitting: Cardiology

## 2011-04-27 ENCOUNTER — Other Ambulatory Visit: Payer: Self-pay | Admitting: Cardiology

## 2011-06-28 ENCOUNTER — Other Ambulatory Visit: Payer: Self-pay | Admitting: Cardiology

## 2011-07-06 LAB — PHENYTOIN LEVEL, FREE AND TOTAL
Phenytoin Bound: 16.6
Phenytoin, Total: 19.1 (ref 10.0–20.0)

## 2011-07-06 LAB — I-STAT 8, (EC8 V) (CONVERTED LAB)
Acid-Base Excess: 5 — ABNORMAL HIGH
Chloride: 103
Glucose, Bld: 131 — ABNORMAL HIGH
Potassium: 2.7 — CL
TCO2: 33
pCO2, Ven: 50.1 — ABNORMAL HIGH
pH, Ven: 7.404 — ABNORMAL HIGH

## 2011-07-06 LAB — CBC
HCT: 34.6 — ABNORMAL LOW
HCT: 37.8
Hemoglobin: 11.6 — ABNORMAL LOW
Hemoglobin: 12.6
MCHC: 33.3
MCHC: 33.5
MCV: 94.7
Platelets: 189
Platelets: 192
Platelets: 202
RDW: 12.5
RDW: 12.6
WBC: 7.3

## 2011-07-06 LAB — COMPREHENSIVE METABOLIC PANEL
Alkaline Phosphatase: 94
BUN: 5 — ABNORMAL LOW
CO2: 26
Chloride: 108
Creatinine, Ser: 0.86
GFR calc non Af Amer: >60
Glucose, Bld: 174 — ABNORMAL HIGH
Potassium: 2.9 — ABNORMAL LOW
Total Bilirubin: 0.7

## 2011-07-06 LAB — I-STAT EC8
Acid-base deficit: 5 — ABNORMAL HIGH
HCT: 35 — ABNORMAL LOW
Hemoglobin: 11.9 — ABNORMAL LOW
Operator id: 176311
Potassium: 3.5
Sodium: 145
TCO2: 21

## 2011-07-06 LAB — DIFFERENTIAL
Basophils Absolute: 0
Basophils Relative: 1
Lymphocytes Relative: 29
Monocytes Absolute: 0.7
Neutro Abs: 4
Neutrophils Relative %: 58

## 2011-07-06 LAB — HEPARIN LEVEL (UNFRACTIONATED): Heparin Unfractionated: 1.12 — ABNORMAL HIGH

## 2011-07-06 LAB — BASIC METABOLIC PANEL
BUN: 3 — ABNORMAL LOW
BUN: 6
Calcium: 7.5 — ABNORMAL LOW
Chloride: 108
Chloride: 113 — ABNORMAL HIGH
Creatinine, Ser: 0.84
Creatinine, Ser: 0.84
GFR calc non Af Amer: >60
GFR calc non Af Amer: >60
Potassium: 2.5 — CL
Sodium: 144

## 2011-07-06 LAB — LIPID PANEL
Cholesterol: 151
LDL Cholesterol: 88

## 2011-07-06 LAB — CALCIUM, IONIZED: Calcium, Ion: 0.97 — ABNORMAL LOW

## 2011-07-06 LAB — CK TOTAL AND CKMB (NOT AT ARMC)
CK, MB: 0.8
CK, MB: 1.2
Relative Index: 0.6

## 2011-07-06 LAB — PROTIME-INR: INR: 1.1

## 2011-07-06 LAB — POCT I-STAT CREATININE: Creatinine, Ser: 0.9

## 2011-07-06 LAB — MAGNESIUM
Magnesium: 1.8
Magnesium: 2

## 2011-07-06 LAB — POCT CARDIAC MARKERS
Myoglobin, poc: 87.5
Operator id: 198171

## 2011-07-06 LAB — APTT
aPTT: 25
aPTT: 27

## 2011-07-06 LAB — TROPONIN I: Troponin I: 0.04

## 2011-07-06 LAB — PHENYTOIN LEVEL, TOTAL: Phenytoin Lvl: 21.1 — ABNORMAL HIGH

## 2011-07-13 LAB — CARDIAC PANEL(CRET KIN+CKTOT+MB+TROPI): Relative Index: 0.7

## 2011-07-13 LAB — D-DIMER, QUANTITATIVE: D-Dimer, Quant: <0.22

## 2011-07-13 LAB — BASIC METABOLIC PANEL
BUN: 7
Calcium: 8.2 — ABNORMAL LOW
Chloride: 107
GFR calc Af Amer: 49 — ABNORMAL LOW
GFR calc non Af Amer: 40 — ABNORMAL LOW
GFR calc non Af Amer: >60
Potassium: 3.3 — ABNORMAL LOW
Potassium: 3.5
Sodium: 140

## 2011-07-13 LAB — APTT: aPTT: 25

## 2011-07-13 LAB — PHENYTOIN LEVEL, TOTAL: Phenytoin Lvl: 4.2 — ABNORMAL LOW

## 2011-07-13 LAB — CBC
HCT: 31.5 — ABNORMAL LOW
HCT: 32.5 — ABNORMAL LOW
Hemoglobin: 10.6 — ABNORMAL LOW
Hemoglobin: 11 — ABNORMAL LOW
Platelets: 187
Platelets: 212
RBC: 3.26 — ABNORMAL LOW
RDW: 12.4
RDW: 12.5
RDW: 12.7
WBC: 6.6

## 2011-07-13 LAB — CK TOTAL AND CKMB (NOT AT ARMC)
CK, MB: 1.5
Relative Index: 0.8

## 2011-07-13 LAB — HEPARIN LEVEL (UNFRACTIONATED)
Heparin Unfractionated: 1.11 — ABNORMAL HIGH
Heparin Unfractionated: <0.1 — ABNORMAL LOW

## 2011-07-13 LAB — TSH: TSH: 43.197 — ABNORMAL HIGH

## 2011-07-13 LAB — COMPREHENSIVE METABOLIC PANEL
Albumin: 3.3 — ABNORMAL LOW
BUN: 14
Calcium: 8.1 — ABNORMAL LOW
Creatinine, Ser: 1.29 — ABNORMAL HIGH
Potassium: 3.1 — ABNORMAL LOW
Total Protein: 6.4

## 2011-07-13 LAB — T4, FREE: Free T4: 0.51 — ABNORMAL LOW

## 2011-07-13 LAB — DIFFERENTIAL
Lymphocytes Relative: 28
Lymphs Abs: 2.2
Monocytes Absolute: 0.8 — ABNORMAL HIGH
Monocytes Relative: 10
Neutro Abs: 4.7

## 2011-07-13 LAB — PROTIME-INR: Prothrombin Time: 14.5

## 2011-09-26 ENCOUNTER — Other Ambulatory Visit: Payer: Self-pay | Admitting: Cardiology

## 2011-10-28 ENCOUNTER — Other Ambulatory Visit: Payer: Self-pay | Admitting: Cardiology

## 2011-11-11 DIAGNOSIS — H4011X0 Primary open-angle glaucoma, stage unspecified: Secondary | ICD-10-CM | POA: Diagnosis not present

## 2011-11-11 DIAGNOSIS — M949 Disorder of cartilage, unspecified: Secondary | ICD-10-CM | POA: Diagnosis not present

## 2011-11-11 DIAGNOSIS — M899 Disorder of bone, unspecified: Secondary | ICD-10-CM | POA: Diagnosis not present

## 2011-11-17 DIAGNOSIS — L608 Other nail disorders: Secondary | ICD-10-CM | POA: Diagnosis not present

## 2011-11-17 DIAGNOSIS — E1149 Type 2 diabetes mellitus with other diabetic neurological complication: Secondary | ICD-10-CM | POA: Diagnosis not present

## 2011-11-17 DIAGNOSIS — Q828 Other specified congenital malformations of skin: Secondary | ICD-10-CM | POA: Diagnosis not present

## 2011-11-25 ENCOUNTER — Other Ambulatory Visit: Payer: Self-pay | Admitting: Cardiology

## 2011-11-27 ENCOUNTER — Other Ambulatory Visit: Payer: Self-pay | Admitting: Cardiology

## 2011-12-10 ENCOUNTER — Encounter: Payer: Self-pay | Admitting: Cardiology

## 2011-12-10 ENCOUNTER — Encounter: Payer: Self-pay | Admitting: *Deleted

## 2011-12-13 ENCOUNTER — Encounter: Payer: Self-pay | Admitting: Cardiology

## 2011-12-13 DIAGNOSIS — E039 Hypothyroidism, unspecified: Secondary | ICD-10-CM | POA: Diagnosis not present

## 2011-12-13 DIAGNOSIS — Z0000 Encounter for general adult medical examination without abnormal findings: Secondary | ICD-10-CM | POA: Diagnosis not present

## 2011-12-13 DIAGNOSIS — E782 Mixed hyperlipidemia: Secondary | ICD-10-CM | POA: Diagnosis not present

## 2011-12-13 DIAGNOSIS — I10 Essential (primary) hypertension: Secondary | ICD-10-CM | POA: Diagnosis not present

## 2011-12-13 DIAGNOSIS — I2510 Atherosclerotic heart disease of native coronary artery without angina pectoris: Secondary | ICD-10-CM | POA: Diagnosis not present

## 2011-12-13 DIAGNOSIS — E119 Type 2 diabetes mellitus without complications: Secondary | ICD-10-CM | POA: Diagnosis not present

## 2011-12-21 ENCOUNTER — Encounter: Payer: Self-pay | Admitting: Cardiology

## 2011-12-21 ENCOUNTER — Ambulatory Visit (INDEPENDENT_AMBULATORY_CARE_PROVIDER_SITE_OTHER): Payer: Medicare Other | Admitting: Cardiology

## 2011-12-21 VITALS — BP 130/72 | HR 65 | Ht 60.0 in | Wt 185.4 lb

## 2011-12-21 DIAGNOSIS — I25810 Atherosclerosis of coronary artery bypass graft(s) without angina pectoris: Secondary | ICD-10-CM | POA: Diagnosis not present

## 2011-12-21 DIAGNOSIS — E7800 Pure hypercholesterolemia, unspecified: Secondary | ICD-10-CM | POA: Diagnosis not present

## 2011-12-21 DIAGNOSIS — E039 Hypothyroidism, unspecified: Secondary | ICD-10-CM

## 2011-12-21 DIAGNOSIS — R011 Cardiac murmur, unspecified: Secondary | ICD-10-CM

## 2011-12-21 DIAGNOSIS — E876 Hypokalemia: Secondary | ICD-10-CM

## 2011-12-21 DIAGNOSIS — I10 Essential (primary) hypertension: Secondary | ICD-10-CM

## 2011-12-21 NOTE — Progress Notes (Signed)
HPI:  She is stable.  She was with her son, and he cooks healthy meals, and she lost a lot of weight. She feels mild tightness when she goes up the hill, but only then, and she has not had progression in any of her symptoms.  I had a long discussion with the patient and her daughter today.  Her daughter is the caretaker for both the patient and her husband---even though they live separately.  No rest pain.  Just had labs done at Dr. Idelle Crouch office.    Current Outpatient Prescriptions  Medication Sig Dispense Refill  . amLODipine (NORVASC) 10 MG tablet take 1 tablet by mouth once daily  30 tablet  2  . aspirin 81 MG tablet Take 81 mg by mouth daily.      Marland Kitchen atorvastatin (LIPITOR) 80 MG tablet Take 80 mg by mouth daily.      . brimonidine-timolol (COMBIGAN) 0.2-0.5 % ophthalmic solution Place 1 drop into both eyes every 12 (twelve) hours.      . calcium-vitamin D 250-100 MG-UNIT per tablet Take 1 tablet by mouth daily.      . carvedilol (COREG) 6.25 MG tablet take 1 tablet by mouth twice a day  64 tablet  5  . ibandronate (BONIVA) 150 MG tablet Take 150 mg by mouth every 30 (thirty) days. Take in the morning with a full glass of water, on an empty stomach, and do not take anything else by mouth or lie down for the next 30 min.      . isosorbide mononitrate (IMDUR) 60 MG 24 hr tablet take 2 tablets by mouth twice a day  120 tablet  3  . lansoprazole (PREVACID SOLUTAB) 30 MG disintegrating tablet Take 30 mg by mouth daily.      Marland Kitchen levothyroxine (SYNTHROID, LEVOTHROID) 112 MCG tablet Take 112 mcg by mouth daily.      Marland Kitchen loteprednol (LOTEMAX) 0.5 % ophthalmic suspension Place 1 drop into both eyes 2 (two) times daily.      . nitroGLYCERIN (NITROSTAT) 0.4 MG SL tablet Place 0.4 mg under the tongue every 5 (five) minutes as needed.      . phenytoin (DILANTIN) 100 MG ER capsule Take 100 mg by mouth 2 (two) times daily.      . potassium chloride (MICRO-K) 10 MEQ CR capsule take 1 capsule by mouth once daily   30 capsule  3  . prednisoLONE sodium phosphate (INFLAMASE FORTE) 1 % ophthalmic solution Place 1 drop into both eyes daily. Every other day        Allergies  Allergen Reactions  . Meperidine Hcl   . Morphine     Past Medical History  Diagnosis Date  . CAD (coronary artery disease)   . Obesity   . Seizure disorder   . GERD (gastroesophageal reflux disease)   . LBBB (left bundle branch block)     chronic  . Glaucoma   . Hypothyroidism   . Hypertension     Past Surgical History  Procedure Date  . Coronary artery bypass graft   . Catarct removal   . Cholycystectomy   . Tubal litgation     bilateral    Family History  Problem Relation Age of Onset  . Diabetes Mother     History   Social History  . Marital Status: Married    Spouse Name: N/A    Number of Children: N/A  . Years of Education: N/A   Occupational History  . Not on  file.   Social History Main Topics  . Smoking status: Never Smoker   . Smokeless tobacco: Not on file  . Alcohol Use: Not on file  . Drug Use: Not on file  . Sexually Active: Not on file   Other Topics Concern  . Not on file   Social History Narrative  . No narrative on file    ROS: Please see the HPI.  All other systems reviewed and negative.  PHYSICAL EXAM:  BP 130/72  Pulse 65  Ht 5' (1.524 m)  Wt 185 lb 6.4 oz (84.097 kg)  BMI 36.21 kg/m2  General: Well developed, well nourished, in no acute distress. Head:  Normocephalic and atraumatic. Neck: no JVD Lungs: Clear to auscultation and percussion. Heart: Normal S1 and S2.  Paradoxical S2.  Soft SEM.   Abdomen:  Normal bowel sounds; soft; non tender; no organomegaly Pulses: Pulses normal in all 4 extremities. Extremities: No clubbing or cyanosis. No edema. Neurologic: Alert and oriented x 3.  EKG:  NSR.  LBBB. (chronic)/ ASSESSMENT AND PLAN:

## 2011-12-21 NOTE — Assessment & Plan Note (Addendum)
Has some moderate angina.  However, stable.  Her daughter thinks she is doing really well, and moreso since losing weight.  We will see her back in about three months, and follow her closely.  See her 2008 cath study.

## 2011-12-21 NOTE — Assessment & Plan Note (Signed)
Controlled.  

## 2011-12-21 NOTE — Assessment & Plan Note (Signed)
With weight loss need to check what recent labs were---done in Dr. Nehemiah Settle office.  Could impact angina.

## 2011-12-21 NOTE — Assessment & Plan Note (Signed)
Takes chronic K.  Recent labs done by Dr. Nehemiah Settle.

## 2011-12-21 NOTE — Patient Instructions (Signed)
Your physician recommends that you schedule a follow-up appointment in: 3 MONTHS with Dr Stuckey  Your physician recommends that you continue on your current medications as directed. Please refer to the Current Medication list given to you today.  

## 2011-12-21 NOTE — Assessment & Plan Note (Signed)
Followed by Dr Polite 

## 2011-12-21 NOTE — Assessment & Plan Note (Signed)
Echo in 2011.  No significant AS.

## 2012-02-24 ENCOUNTER — Other Ambulatory Visit: Payer: Self-pay | Admitting: Cardiology

## 2012-03-08 DIAGNOSIS — H20029 Recurrent acute iridocyclitis, unspecified eye: Secondary | ICD-10-CM | POA: Diagnosis not present

## 2012-03-08 DIAGNOSIS — H4011X0 Primary open-angle glaucoma, stage unspecified: Secondary | ICD-10-CM | POA: Diagnosis not present

## 2012-03-17 DIAGNOSIS — E1149 Type 2 diabetes mellitus with other diabetic neurological complication: Secondary | ICD-10-CM | POA: Diagnosis not present

## 2012-03-17 DIAGNOSIS — L608 Other nail disorders: Secondary | ICD-10-CM | POA: Diagnosis not present

## 2012-03-17 DIAGNOSIS — Q828 Other specified congenital malformations of skin: Secondary | ICD-10-CM | POA: Diagnosis not present

## 2012-03-20 ENCOUNTER — Encounter: Payer: Self-pay | Admitting: Cardiology

## 2012-03-20 ENCOUNTER — Ambulatory Visit (INDEPENDENT_AMBULATORY_CARE_PROVIDER_SITE_OTHER): Payer: Medicare Other | Admitting: Cardiology

## 2012-03-20 VITALS — BP 112/62 | HR 53 | Ht 60.0 in | Wt 191.0 lb

## 2012-03-20 DIAGNOSIS — I25810 Atherosclerosis of coronary artery bypass graft(s) without angina pectoris: Secondary | ICD-10-CM | POA: Diagnosis not present

## 2012-03-20 DIAGNOSIS — R011 Cardiac murmur, unspecified: Secondary | ICD-10-CM | POA: Diagnosis not present

## 2012-03-20 DIAGNOSIS — I10 Essential (primary) hypertension: Secondary | ICD-10-CM | POA: Diagnosis not present

## 2012-03-20 DIAGNOSIS — E7800 Pure hypercholesterolemia, unspecified: Secondary | ICD-10-CM

## 2012-03-20 DIAGNOSIS — E876 Hypokalemia: Secondary | ICD-10-CM

## 2012-03-20 NOTE — Assessment & Plan Note (Signed)
She will be seeing Dr. Nehemiah Settle fairly soon.  She gets labs regularly so will get him to check again.  She is on low dose K.

## 2012-03-20 NOTE — Progress Notes (Signed)
HPI:  The patient is doing okay at the present time. However, she's gained quite a bit of weight back. She snacks at night, and am unable to stop doing this. Her daughter reminded her of this today. She's not been having much the way of chest pain. She's otherwise remained stable.  Current Outpatient Prescriptions  Medication Sig Dispense Refill  . amLODipine (NORVASC) 10 MG tablet take 1 tablet by mouth once daily  30 tablet  6  . aspirin 81 MG tablet Take 81 mg by mouth daily.      Marland Kitchen atorvastatin (LIPITOR) 80 MG tablet Take 80 mg by mouth daily.      . brimonidine-timolol (COMBIGAN) 0.2-0.5 % ophthalmic solution Place 1 drop into both eyes every 12 (twelve) hours.      . calcium-vitamin D 250-100 MG-UNIT per tablet Take 1 tablet by mouth daily.      . carvedilol (COREG) 6.25 MG tablet take 1 tablet by mouth twice a day  64 tablet  5  . ibandronate (BONIVA) 150 MG tablet Take 150 mg by mouth every 30 (thirty) days. Take in the morning with a full glass of water, on an empty stomach, and do not take anything else by mouth or lie down for the next 30 min.      . isosorbide mononitrate (IMDUR) 60 MG 24 hr tablet take 2 tablets by mouth twice a day  120 tablet  6  . levothyroxine (SYNTHROID, LEVOTHROID) 112 MCG tablet Take 112 mcg by mouth daily.      Marland Kitchen loteprednol (LOTEMAX) 0.5 % ophthalmic suspension Place 1 drop into both eyes 2 (two) times daily.      . nitroGLYCERIN (NITROSTAT) 0.4 MG SL tablet Place 0.4 mg under the tongue every 5 (five) minutes as needed.      Marland Kitchen omeprazole (PRILOSEC) 20 MG capsule Take 20 mg by mouth daily.      . phenytoin (DILANTIN) 100 MG ER capsule Take 100 mg by mouth 2 (two) times daily.      . potassium chloride (MICRO-K) 10 MEQ CR capsule take 1 capsule by mouth once daily  30 capsule  6  . prednisoLONE sodium phosphate (INFLAMASE FORTE) 1 % ophthalmic solution Place 1 drop into both eyes daily. Every other day        Allergies  Allergen Reactions  . Meperidine  Hcl   . Morphine   . Nitroglycerin     Patch and Cream only    Past Medical History  Diagnosis Date  . CAD (coronary artery disease)   . Obesity   . Seizure disorder   . GERD (gastroesophageal reflux disease)   . LBBB (left bundle branch block)     chronic  . Glaucoma   . Hypothyroidism   . Hypertension     Past Surgical History  Procedure Date  . Coronary artery bypass graft   . Catarct removal   . Cholycystectomy   . Tubal litgation     bilateral    Family History  Problem Relation Age of Onset  . Diabetes Mother     History   Social History  . Marital Status: Married    Spouse Name: N/A    Number of Children: N/A  . Years of Education: N/A   Occupational History  . Not on file.   Social History Main Topics  . Smoking status: Never Smoker   . Smokeless tobacco: Not on file  . Alcohol Use: Not on file  . Drug  Use: Not on file  . Sexually Active: Not on file   Other Topics Concern  . Not on file   Social History Narrative  . No narrative on file    ROS: Please see the HPI.  All other systems reviewed and negative.  PHYSICAL EXAM:  BP 112/62  Pulse 53  Ht 5' (1.524 m)  Wt 191 lb (86.637 kg)  BMI 37.30 kg/m2  General: Well developed, well nourished, in no acute distress. Head:  Normocephalic and atraumatic. Neck: no JVD Lungs: Clear to auscultation and percussion. Heart: Normal S1 and paradoxical S2.  2/6 SEM.  No diastolic murmur.   Pulses: Pulses normal in all 4 extremities. Extremities: No clubbing or cyanosis. No edema. Neurologic: Alert and oriented x 3.  EKG:  SB.  LBBB.  Rate 53  ASSESSMENT AND PLAN:

## 2012-03-20 NOTE — Assessment & Plan Note (Signed)
No progression in symptoms.  Needs to continue to walk.

## 2012-03-20 NOTE — Patient Instructions (Addendum)
Your physician wants you to follow-up in: 6 MONTHS with Dr Stuckey.  You will receive a reminder letter in the mail two months in advance. If you don't receive a letter, please call our office to schedule the follow-up appointment.  Your physician recommends that you continue on your current medications as directed. Please refer to the Current Medication list given to you today.  

## 2012-03-20 NOTE — Assessment & Plan Note (Signed)
Nicely controlled.   

## 2012-03-20 NOTE — Assessment & Plan Note (Signed)
Will need lipid and liver with Dr. Nehemiah Settle.

## 2012-03-20 NOTE — Assessment & Plan Note (Signed)
Noted that she has more prominent murmur.  Last echo consistent with aortic valve changes.  Suspect mild AS.  Will get 2D echo to confirm, and set groundwork for follow up.

## 2012-04-06 DIAGNOSIS — Z1231 Encounter for screening mammogram for malignant neoplasm of breast: Secondary | ICD-10-CM | POA: Diagnosis not present

## 2012-05-19 ENCOUNTER — Other Ambulatory Visit: Payer: Self-pay | Admitting: Cardiology

## 2012-06-27 DIAGNOSIS — B351 Tinea unguium: Secondary | ICD-10-CM | POA: Diagnosis not present

## 2012-06-27 DIAGNOSIS — M79609 Pain in unspecified limb: Secondary | ICD-10-CM | POA: Diagnosis not present

## 2012-07-28 DIAGNOSIS — H4011X0 Primary open-angle glaucoma, stage unspecified: Secondary | ICD-10-CM | POA: Diagnosis not present

## 2012-07-28 DIAGNOSIS — E119 Type 2 diabetes mellitus without complications: Secondary | ICD-10-CM | POA: Diagnosis not present

## 2012-08-28 DIAGNOSIS — M949 Disorder of cartilage, unspecified: Secondary | ICD-10-CM | POA: Diagnosis not present

## 2012-08-28 DIAGNOSIS — Z23 Encounter for immunization: Secondary | ICD-10-CM | POA: Diagnosis not present

## 2012-08-28 DIAGNOSIS — E039 Hypothyroidism, unspecified: Secondary | ICD-10-CM | POA: Diagnosis not present

## 2012-08-28 DIAGNOSIS — I10 Essential (primary) hypertension: Secondary | ICD-10-CM | POA: Diagnosis not present

## 2012-08-28 DIAGNOSIS — E782 Mixed hyperlipidemia: Secondary | ICD-10-CM | POA: Diagnosis not present

## 2012-08-28 DIAGNOSIS — M899 Disorder of bone, unspecified: Secondary | ICD-10-CM | POA: Diagnosis not present

## 2012-08-28 DIAGNOSIS — E1129 Type 2 diabetes mellitus with other diabetic kidney complication: Secondary | ICD-10-CM | POA: Diagnosis not present

## 2012-08-28 DIAGNOSIS — E663 Overweight: Secondary | ICD-10-CM | POA: Diagnosis not present

## 2012-08-28 DIAGNOSIS — N182 Chronic kidney disease, stage 2 (mild): Secondary | ICD-10-CM | POA: Diagnosis not present

## 2012-09-15 ENCOUNTER — Other Ambulatory Visit: Payer: Self-pay | Admitting: *Deleted

## 2012-09-15 MED ORDER — POTASSIUM CHLORIDE ER 10 MEQ PO CPCR: 10.0000 meq | ORAL_CAPSULE | Freq: Every day | ORAL | Status: DC

## 2012-09-15 MED ORDER — ISOSORBIDE MONONITRATE ER 60 MG PO TB24: 60.0000 mg | ORAL_TABLET | Freq: Every day | ORAL | Status: DC

## 2012-09-15 MED ORDER — AMLODIPINE BESYLATE 10 MG PO TABS: 10.0000 mg | ORAL_TABLET | Freq: Every day | ORAL | Status: DC

## 2012-09-18 ENCOUNTER — Other Ambulatory Visit: Payer: Self-pay | Admitting: *Deleted

## 2012-09-22 ENCOUNTER — Other Ambulatory Visit: Payer: Self-pay | Admitting: *Deleted

## 2012-09-22 MED ORDER — CARVEDILOL 6.25 MG PO TABS: 6.2500 mg | ORAL_TABLET | Freq: Two times a day (BID) | ORAL | Status: DC

## 2012-09-29 ENCOUNTER — Emergency Department (HOSPITAL_COMMUNITY): Payer: Medicare Other

## 2012-09-29 ENCOUNTER — Encounter (HOSPITAL_COMMUNITY): Payer: Self-pay | Admitting: Emergency Medicine

## 2012-09-29 ENCOUNTER — Emergency Department (HOSPITAL_COMMUNITY)
Admission: EM | Admit: 2012-09-29 | Discharge: 2012-09-29 | Disposition: A | Discharge status: Home / Self Care | Payer: Medicare Other | Attending: Emergency Medicine | Admitting: Emergency Medicine

## 2012-09-29 DIAGNOSIS — Z7982 Long term (current) use of aspirin: Secondary | ICD-10-CM | POA: Diagnosis not present

## 2012-09-29 DIAGNOSIS — Z9849 Cataract extraction status, unspecified eye: Secondary | ICD-10-CM | POA: Insufficient documentation

## 2012-09-29 DIAGNOSIS — H409 Unspecified glaucoma: Secondary | ICD-10-CM | POA: Insufficient documentation

## 2012-09-29 DIAGNOSIS — Y929 Unspecified place or not applicable: Secondary | ICD-10-CM | POA: Insufficient documentation

## 2012-09-29 DIAGNOSIS — Z8679 Personal history of other diseases of the circulatory system: Secondary | ICD-10-CM | POA: Insufficient documentation

## 2012-09-29 DIAGNOSIS — Z79899 Other long term (current) drug therapy: Secondary | ICD-10-CM | POA: Diagnosis not present

## 2012-09-29 DIAGNOSIS — I2510 Atherosclerotic heart disease of native coronary artery without angina pectoris: Secondary | ICD-10-CM | POA: Diagnosis not present

## 2012-09-29 DIAGNOSIS — K219 Gastro-esophageal reflux disease without esophagitis: Secondary | ICD-10-CM | POA: Insufficient documentation

## 2012-09-29 DIAGNOSIS — G40909 Epilepsy, unspecified, not intractable, without status epilepticus: Secondary | ICD-10-CM | POA: Diagnosis not present

## 2012-09-29 DIAGNOSIS — W010XXA Fall on same level from slipping, tripping and stumbling without subsequent striking against object, initial encounter: Secondary | ICD-10-CM | POA: Insufficient documentation

## 2012-09-29 DIAGNOSIS — E039 Hypothyroidism, unspecified: Secondary | ICD-10-CM | POA: Diagnosis not present

## 2012-09-29 DIAGNOSIS — E119 Type 2 diabetes mellitus without complications: Secondary | ICD-10-CM | POA: Diagnosis not present

## 2012-09-29 DIAGNOSIS — S8991XA Unspecified injury of right lower leg, initial encounter: Secondary | ICD-10-CM

## 2012-09-29 DIAGNOSIS — S8990XA Unspecified injury of unspecified lower leg, initial encounter: Secondary | ICD-10-CM | POA: Insufficient documentation

## 2012-09-29 DIAGNOSIS — I10 Essential (primary) hypertension: Secondary | ICD-10-CM | POA: Insufficient documentation

## 2012-09-29 DIAGNOSIS — R6889 Other general symptoms and signs: Secondary | ICD-10-CM | POA: Diagnosis not present

## 2012-09-29 DIAGNOSIS — Y9389 Activity, other specified: Secondary | ICD-10-CM | POA: Insufficient documentation

## 2012-09-29 DIAGNOSIS — M25569 Pain in unspecified knee: Secondary | ICD-10-CM | POA: Diagnosis not present

## 2012-09-29 DIAGNOSIS — E669 Obesity, unspecified: Secondary | ICD-10-CM | POA: Insufficient documentation

## 2012-09-29 DIAGNOSIS — S99919A Unspecified injury of unspecified ankle, initial encounter: Secondary | ICD-10-CM | POA: Diagnosis not present

## 2012-09-29 DIAGNOSIS — E161 Other hypoglycemia: Secondary | ICD-10-CM | POA: Diagnosis not present

## 2012-09-29 MED ORDER — HYDROMORPHONE HCL PF 1 MG/ML IJ SOLN
0.5000 mg | INTRAMUSCULAR | Status: AC
  Administered 2012-09-29: 0.5 mg via INTRAMUSCULAR
  Filled 2012-09-29: qty 1

## 2012-09-29 MED ORDER — TETANUS-DIPHTH-ACELL PERTUSSIS 5-2.5-18.5 LF-MCG/0.5 IM SUSP
0.5000 mL | Freq: Once | INTRAMUSCULAR | Status: AC
  Administered 2012-09-29: 0.5 mL via INTRAMUSCULAR
  Filled 2012-09-29: qty 0.5

## 2012-09-29 MED ORDER — HYDROCODONE-ACETAMINOPHEN 5-325 MG PO TABS: 1.0000 | ORAL_TABLET | Freq: Four times a day (QID) | ORAL | Status: DC | PRN

## 2012-09-29 NOTE — ED Notes (Signed)
Patient ambulated with her cane.  Patient able to weight bear on right leg.  Stated that she wasn't able to do this before coming to the ER.

## 2012-09-29 NOTE — ED Notes (Signed)
[facility] at bedside to pick up patient to transport patient to home. Pt alert and oriented. No signs of distress noted

## 2012-09-29 NOTE — ED Notes (Signed)
MD at bedside. 

## 2012-09-29 NOTE — ED Notes (Signed)
Patient transported by [facility] for complaint of right knee pain after a fall.  Fall occurred at approximately 1900 last night.  Complaint of tenderness to the right knee, worse with ambulation.  EMS reports no deformity noted to right knee.

## 2012-09-29 NOTE — ED Provider Notes (Signed)
History     CSN: [id number]  Arrival date & time 09/29/12  0251   First MD Initiated Contact with Patient 09/29/12 0258      Chief Complaint  Patient presents with  . Fall    7pm last night, right knee pain    (Consider location/radiation/quality/duration/timing/severity/associated sxs/prior treatment) HPI History provided by patient. At home tonight and got up out of a chair and tripped landing on her right knee. She sustained a small abrasion to her knee. This occurred around 7 PM. Since that time has had persistent pain, hurts to walk and this morning having difficulty walking due to pain. Brought in by EMS.  No other injury from fall. No head injury. No neck pain. No weakness or numbness. Patient unable to say it has any swelling in her knee. Pain is moderate to severe. Pain is sharp in quality. is not radiating. Past Medical History  Diagnosis Date  . CAD (coronary artery disease)   . Obesity   . Seizure disorder   . GERD (gastroesophageal reflux disease)   . LBBB (left bundle branch block)     chronic  . Glaucoma(365)   . Hypothyroidism   . Hypertension   . Diabetes mellitus without complication     Past Surgical History  Procedure Date  . Coronary artery bypass graft   . Catarct removal   . Cholycystectomy   . Tubal litgation     bilateral    Family History  Problem Relation Age of Onset  . Diabetes Mother     History  Substance Use Topics  . Smoking status: Never Smoker   . Smokeless tobacco: Not on file  . Alcohol Use: Not on file    OB History    Grav Para Term Preterm Abortions TAB SAB Ect Mult Living                  Review of Systems  Constitutional: Negative for fever and chills.  HENT: Negative for neck pain and neck stiffness.   Eyes: Negative for visual disturbance.  Respiratory: Negative for shortness of breath.   Cardiovascular: Negative for chest pain and leg swelling.  Gastrointestinal: Negative for abdominal pain.  Genitourinary:  Negative for flank pain.  Musculoskeletal: Negative for back pain.  Skin: Negative for rash.  Neurological: Negative for headaches.  All other systems reviewed and are negative.    Allergies  Meperidine hcl; Morphine; and Nitroglycerin  Home Medications   Current Outpatient Rx  Name  Route  Sig  Dispense  Refill  . AMLODIPINE BESYLATE 10 MG PO TABS   Oral   Take 1 tablet (10 mg total) by mouth daily.   30 tablet   2   . ASPIRIN 81 MG PO TABS   Oral   Take 81 mg by mouth daily.         . ATORVASTATIN CALCIUM 80 MG PO TABS   Oral   Take 80 mg by mouth daily.         Marland Kitchen BRIMONIDINE TARTRATE-TIMOLOL 0.2-0.5 % OP SOLN   Both Eyes   Place 1 drop into both eyes every 12 (twelve) hours.         Marland Kitchen CARVEDILOL 6.25 MG PO TABS   Oral   Take 1 tablet (6.25 mg total) by mouth 2 (two) times daily with a meal.   64 tablet   3   . IBANDRONATE SODIUM 150 MG PO TABS   Oral   Take 150 mg by mouth  every 30 (thirty) days. Take in the morning with a full glass of water, on an empty stomach, and do not take anything else by mouth or lie down for the next 30 min.         . ISOSORBIDE MONONITRATE ER 60 MG PO TB24   Oral   Take 1 tablet (60 mg total) by mouth daily. 2 po bid   120 tablet   2   . LEVOTHYROXINE SODIUM 112 MCG PO TABS   Oral   Take 112 mcg by mouth daily.         Marland Kitchen LOTEPREDNOL ETABONATE 0.5 % OP SUSP   Both Eyes   Place 1 drop into both eyes 2 (two) times daily.         Marland Kitchen METFORMIN HCL ER 500 MG PO TB24   Oral   Take 500 mg by mouth 2 (two) times daily.         Marland Kitchen NITROGLYCERIN 0.4 MG SL SUBL   Sublingual   Place 0.4 mg under the tongue every 5 (five) minutes as needed.         Marland Kitchen OMEPRAZOLE 20 MG PO CPDR   Oral   Take 20 mg by mouth daily.         Marland Kitchen PHENYTOIN SODIUM EXTENDED 100 MG PO CAPS   Oral   Take 100 mg by mouth 2 (two) times daily.         Marland Kitchen POTASSIUM CHLORIDE ER 10 MEQ PO CPCR   Oral   Take 1 capsule (10 mEq total) by mouth  daily.   30 capsule   2   . PREDNISOLONE SODIUM PHOSPHATE 1 % OP SOLN   Both Eyes   Place 1 drop into both eyes daily. Every other day           BP 121/75  Pulse 69  Temp 98.3 F (36.8 C) (Oral)  Resp 16  SpO2 99%  Physical Exam  Constitutional: She is oriented to person, place, and time. She appears well-developed and well-nourished.  HENT:  Head: Normocephalic and atraumatic.  Eyes: EOM are normal. Pupils are equal, round, and reactive to light.  Neck: Neck supple.  Cardiovascular: Normal rate, regular rhythm and intact distal pulses.   Pulmonary/Chest: Effort normal and breath sounds normal. No respiratory distress. She exhibits no tenderness.  Musculoskeletal:       Right lower extremity with tenderness over the anterior patella with superficial abrasion. No effusion. Distal neurovascular is intact. Nontender over ankle and hip. Decreased range of motion at the knee secondary to pain. No obvious deformity. No laxity appreciated  Neurological: She is alert and oriented to person, place, and time.  Skin: Skin is warm and dry.    ED Course  Procedures (including critical care time)  Labs Reviewed - No data to display Dg Knee Complete 4 Views Right  09/29/2012  *RADIOLOGY REPORT*  Clinical Data: Status post fall; right knee pain.  RIGHT KNEE - COMPLETE 4+ VIEW  Comparison: None.  Findings: There is no evidence of fracture or dislocation.  Small marginal osteophytes are noted at the medial and lateral compartments, and significant cortical irregularity and subcortical cystic change is seen along the patellofemoral compartment.  Mild medial compartment narrowing is suggested.  Mild subcortical cysts are noted at the tibial spine.  No significant joint effusion is seen.  Diffuse vascular calcifications are seen.  IMPRESSION:  1.  No evidence of fracture or dislocation. 2.  Tricompartmental osteoarthritis noted. 3.  Diffuse vascular calcifications seen.   Original Report  Authenticated By: Tonia Ghent, M.D.    IM Dilaudid. Ice.  MDM   Mechanical fall with right knee injury. Evaluated with x-ray reviewed as above. Pain medications provided and condition improving. Wound care provided for abrasion. Ace wrap. Patient has a 4 pronged cane with her and is able to ambulate with her cane. Orthopedic referral provided. Prescription for pain medications provided. Vital signs and nursing notes reviewed.        Sunnie Nielsen, MD 09/29/12 [phone]

## 2012-10-03 DIAGNOSIS — L608 Other nail disorders: Secondary | ICD-10-CM | POA: Diagnosis not present

## 2012-10-03 DIAGNOSIS — E1149 Type 2 diabetes mellitus with other diabetic neurological complication: Secondary | ICD-10-CM | POA: Diagnosis not present

## 2012-10-24 DIAGNOSIS — H4011X0 Primary open-angle glaucoma, stage unspecified: Secondary | ICD-10-CM | POA: Diagnosis not present

## 2012-10-24 DIAGNOSIS — H20029 Recurrent acute iridocyclitis, unspecified eye: Secondary | ICD-10-CM | POA: Diagnosis not present

## 2012-11-02 ENCOUNTER — Ambulatory Visit (INDEPENDENT_AMBULATORY_CARE_PROVIDER_SITE_OTHER): Payer: Medicare Other | Admitting: Cardiology

## 2012-11-02 VITALS — BP 130/60 | HR 47 | Ht 60.0 in | Wt 195.0 lb

## 2012-11-02 DIAGNOSIS — E876 Hypokalemia: Secondary | ICD-10-CM | POA: Diagnosis not present

## 2012-11-02 DIAGNOSIS — R011 Cardiac murmur, unspecified: Secondary | ICD-10-CM | POA: Diagnosis not present

## 2012-11-02 DIAGNOSIS — I25810 Atherosclerosis of coronary artery bypass graft(s) without angina pectoris: Secondary | ICD-10-CM | POA: Diagnosis not present

## 2012-11-02 NOTE — Assessment & Plan Note (Signed)
Rechecked at primary office.

## 2012-11-02 NOTE — Assessment & Plan Note (Signed)
Has known disease but has been clinically stable.

## 2012-11-02 NOTE — Progress Notes (Signed)
HPI:  Patient seen back in followup. She was supposed to have an echocardiogram but this never got done. She does have a murmur suggestive of aortic stenosis.. Not been having a change in symptoms although she has gained some weight. She's had all of her labs done with Dr. Nehemiah Settle recently.  No progression in symptoms at all.    Current Outpatient Prescriptions  Medication Sig Dispense Refill  . amLODipine (NORVASC) 10 MG tablet Take 1 tablet (10 mg total) by mouth daily.  30 tablet  2  . aspirin 81 MG tablet Take 81 mg by mouth daily.      Marland Kitchen atorvastatin (LIPITOR) 80 MG tablet Take 80 mg by mouth daily.      . brimonidine-timolol (COMBIGAN) 0.2-0.5 % ophthalmic solution Place 1 drop into both eyes every 12 (twelve) hours.      . carvedilol (COREG) 6.25 MG tablet Take 1 tablet (6.25 mg total) by mouth 2 (two) times daily with a meal.  64 tablet  3  . HYDROcodone-acetaminophen (NORCO/VICODIN) 5-325 MG per tablet Take 1 tablet by mouth every 6 (six) hours as needed for pain.  10 tablet  0  . ibandronate (BONIVA) 150 MG tablet Take 150 mg by mouth every 30 (thirty) days. Take in the morning with a full glass of water, on an empty stomach, and do not take anything else by mouth or lie down for the next 30 min.      . isosorbide mononitrate (IMDUR) 60 MG 24 hr tablet Take 1 tablet (60 mg total) by mouth daily. 2 po bid  120 tablet  2  . levothyroxine (SYNTHROID, LEVOTHROID) 112 MCG tablet Take 112 mcg by mouth daily.      Marland Kitchen loteprednol (LOTEMAX) 0.5 % ophthalmic suspension Place 1 drop into both eyes 2 (two) times daily.      . metFORMIN (GLUCOPHAGE-XR) 500 MG 24 hr tablet Take 500 mg by mouth 2 (two) times daily.      . nitroGLYCERIN (NITROSTAT) 0.4 MG SL tablet Place 0.4 mg under the tongue every 5 (five) minutes as needed.      Marland Kitchen omeprazole (PRILOSEC) 20 MG capsule Take 20 mg by mouth daily.      . phenytoin (DILANTIN) 100 MG ER capsule Take 100 mg by mouth 2 (two) times daily.      . potassium  chloride (MICRO-K) 10 MEQ CR capsule Take 1 capsule (10 mEq total) by mouth daily.  30 capsule  2  . prednisoLONE sodium phosphate (INFLAMASE FORTE) 1 % ophthalmic solution Place 1 drop into both eyes daily. Every other day        Allergies  Allergen Reactions  . Meperidine Hcl   . Morphine   . Nitroglycerin     Patch and Cream only    Past Medical History  Diagnosis Date  . CAD (coronary artery disease)   . Obesity   . Seizure disorder   . GERD (gastroesophageal reflux disease)   . LBBB (left bundle branch block)     chronic  . Glaucoma(365)   . Hypothyroidism   . Hypertension   . Diabetes mellitus without complication     Past Surgical History  Procedure Date  . Coronary artery bypass graft   . Catarct removal   . Cholycystectomy   . Tubal litgation     bilateral    Family History  Problem Relation Age of Onset  . Diabetes Mother     History   Social History  .  Marital Status: Married    Spouse Name: N/A    Number of Children: N/A  . Years of Education: N/A   Occupational History  . Not on file.   Social History Main Topics  . Smoking status: Never Smoker   . Smokeless tobacco: Not on file  . Alcohol Use: Not on file  . Drug Use: Not on file  . Sexually Active: Not on file   Other Topics Concern  . Not on file   Social History Narrative  . No narrative on file    ROS: Please see the HPI.  All other systems reviewed and negative.  PHYSICAL EXAM:  BP 130/60  Pulse 47  Ht 5' (1.524 m)  Wt 195 lb (88.451 kg)  BMI 38.08 kg/m2  SpO2 99%  General: Well developed, well nourished, in no acute distress. Head:  Normocephalic and atraumatic. Neck: no JVD Lungs: Clear to auscultation and percussion. Heart: Normal S1 and S2.  SEM noted ULSE.  Radiation to the base.  2-3/6.  No DM.    Pulses: Pulses normal in all 4 extremities. Extremities: No clubbing or cyanosis. No edema. Neurologic: Alert and oriented x 3.  EKG:  SB.  LBBB>.  ASSESSMENT  AND PLAN:

## 2012-11-02 NOTE — Assessment & Plan Note (Signed)
Exam suggests moderate AS.  Will get echo since this did not get done.  Will reschedule and she will see Dr. [name] back in followup.

## 2012-11-02 NOTE — Patient Instructions (Addendum)
Your physician has requested that you have an echocardiogram in 4 MONTHS. Echocardiography is a painless test that uses sound waves to create images of your heart. It provides your doctor with information about the size and shape of your heart and how well your heart's chambers and valves are working. This procedure takes approximately one hour. There are no restrictions for this procedure.  Your physician recommends that you continue on your current medications as directed. Please refer to the Current Medication list given to you today.  Your physician wants you to follow-up in: 4 MONTHS with Dr [name]. You will receive a reminder letter in the mail two months in advance. If you don't receive a letter, please call our office to schedule the follow-up appointment.

## 2012-12-19 ENCOUNTER — Other Ambulatory Visit: Payer: Self-pay | Admitting: *Deleted

## 2012-12-19 MED ORDER — ISOSORBIDE MONONITRATE ER 60 MG PO TB24: 60.0000 mg | ORAL_TABLET | Freq: Every day | ORAL | Status: DC

## 2012-12-19 MED ORDER — POTASSIUM CHLORIDE ER 10 MEQ PO CPCR: 10.0000 meq | ORAL_CAPSULE | Freq: Every day | ORAL | Status: DC

## 2012-12-19 MED ORDER — AMLODIPINE BESYLATE 10 MG PO TABS: 10.0000 mg | ORAL_TABLET | Freq: Every day | ORAL | Status: DC

## 2012-12-29 DIAGNOSIS — R569 Unspecified convulsions: Secondary | ICD-10-CM | POA: Diagnosis not present

## 2012-12-29 DIAGNOSIS — E1129 Type 2 diabetes mellitus with other diabetic kidney complication: Secondary | ICD-10-CM | POA: Diagnosis not present

## 2012-12-29 DIAGNOSIS — E039 Hypothyroidism, unspecified: Secondary | ICD-10-CM | POA: Diagnosis not present

## 2012-12-29 DIAGNOSIS — N183 Chronic kidney disease, stage 3 unspecified: Secondary | ICD-10-CM | POA: Diagnosis not present

## 2012-12-29 DIAGNOSIS — E782 Mixed hyperlipidemia: Secondary | ICD-10-CM | POA: Diagnosis not present

## 2012-12-29 DIAGNOSIS — I10 Essential (primary) hypertension: Secondary | ICD-10-CM | POA: Diagnosis not present

## 2013-01-02 DIAGNOSIS — E1149 Type 2 diabetes mellitus with other diabetic neurological complication: Secondary | ICD-10-CM | POA: Diagnosis not present

## 2013-01-02 DIAGNOSIS — L608 Other nail disorders: Secondary | ICD-10-CM | POA: Diagnosis not present

## 2013-01-04 DIAGNOSIS — H4011X0 Primary open-angle glaucoma, stage unspecified: Secondary | ICD-10-CM | POA: Diagnosis not present

## 2013-01-22 DIAGNOSIS — R059 Cough, unspecified: Secondary | ICD-10-CM | POA: Diagnosis not present

## 2013-01-22 DIAGNOSIS — R05 Cough: Secondary | ICD-10-CM | POA: Diagnosis not present

## 2013-01-24 ENCOUNTER — Other Ambulatory Visit: Payer: Self-pay | Admitting: *Deleted

## 2013-01-24 MED ORDER — CARVEDILOL 6.25 MG PO TABS: 6.2500 mg | ORAL_TABLET | Freq: Two times a day (BID) | ORAL | Status: DC

## 2013-03-13 ENCOUNTER — Ambulatory Visit (INDEPENDENT_AMBULATORY_CARE_PROVIDER_SITE_OTHER): Payer: Medicare Other | Admitting: Cardiology

## 2013-03-13 ENCOUNTER — Ambulatory Visit (HOSPITAL_COMMUNITY): Payer: Medicare Other | Attending: Cardiovascular Disease | Admitting: Radiology

## 2013-03-13 ENCOUNTER — Encounter: Payer: Self-pay | Admitting: Cardiology

## 2013-03-13 VITALS — BP 128/78 | HR 68 | Ht 61.0 in | Wt 197.0 lb

## 2013-03-13 DIAGNOSIS — R0989 Other specified symptoms and signs involving the circulatory and respiratory systems: Secondary | ICD-10-CM

## 2013-03-13 DIAGNOSIS — E7800 Pure hypercholesterolemia, unspecified: Secondary | ICD-10-CM | POA: Diagnosis not present

## 2013-03-13 DIAGNOSIS — I5040 Unspecified combined systolic (congestive) and diastolic (congestive) heart failure: Secondary | ICD-10-CM

## 2013-03-13 DIAGNOSIS — R011 Cardiac murmur, unspecified: Secondary | ICD-10-CM | POA: Insufficient documentation

## 2013-03-13 DIAGNOSIS — E119 Type 2 diabetes mellitus without complications: Secondary | ICD-10-CM | POA: Insufficient documentation

## 2013-03-13 DIAGNOSIS — I10 Essential (primary) hypertension: Secondary | ICD-10-CM | POA: Insufficient documentation

## 2013-03-13 DIAGNOSIS — I2510 Atherosclerotic heart disease of native coronary artery without angina pectoris: Secondary | ICD-10-CM | POA: Insufficient documentation

## 2013-03-13 DIAGNOSIS — E669 Obesity, unspecified: Secondary | ICD-10-CM | POA: Diagnosis not present

## 2013-03-13 DIAGNOSIS — I25810 Atherosclerosis of coronary artery bypass graft(s) without angina pectoris: Secondary | ICD-10-CM

## 2013-03-13 DIAGNOSIS — R0609 Other forms of dyspnea: Secondary | ICD-10-CM | POA: Diagnosis not present

## 2013-03-13 DIAGNOSIS — R0600 Dyspnea, unspecified: Secondary | ICD-10-CM

## 2013-03-13 MED ORDER — POTASSIUM CHLORIDE CRYS ER 20 MEQ PO TBCR: 20.0000 meq | EXTENDED_RELEASE_TABLET | Freq: Every day | ORAL | Status: DC

## 2013-03-13 MED ORDER — FUROSEMIDE 40 MG PO TABS: 40.0000 mg | ORAL_TABLET | Freq: Every day | ORAL | Status: DC

## 2013-03-13 NOTE — Progress Notes (Signed)
Echocardiogram performed.  

## 2013-03-13 NOTE — Patient Instructions (Addendum)
Take lasix(furosemide) 40mg  daily.   Increase KCL (potassium) to 20 mEq daily.  Your physician recommends that you have  lab work today-Lipid profile/BMET/BNP.   Your physician has requested that you have a lexiscan myoview. For further information please visit [email or web address] Please follow instruction sheet, as given.  Your physician recommends that you return for lab work in: 10 days--BMET/BNP   Your physician recommends that you schedule a follow-up appointment in: 2-3 weeks with Dr [name].

## 2013-03-14 LAB — BRAIN NATRIURETIC PEPTIDE: Pro B Natriuretic peptide (BNP): 290 pg/mL — ABNORMAL HIGH (ref 0.0–100.0)

## 2013-03-14 LAB — BASIC METABOLIC PANEL
Calcium: 8.2 mg/dL — ABNORMAL LOW (ref 8.4–10.5)
GFR: 90.62 mL/min (ref >60.00)
Glucose, Bld: 69 mg/dL — ABNORMAL LOW (ref 70–99)
Sodium: 141 mEq/L (ref 135–145)

## 2013-03-14 LAB — LIPID PANEL
Cholesterol: 161 mg/dL (ref 0–200)
HDL: 69.3 mg/dL (ref >39.00)

## 2013-03-15 DIAGNOSIS — R0609 Other forms of dyspnea: Secondary | ICD-10-CM | POA: Insufficient documentation

## 2013-03-15 DIAGNOSIS — I509 Heart failure, unspecified: Secondary | ICD-10-CM | POA: Insufficient documentation

## 2013-03-15 NOTE — Progress Notes (Signed)
Patient ID: Christine Gay, female   DOB: 11/05/1940, 72 y.o.   MRN: [id number] PCP: Dr. Nehemiah Settle  72 yo with history of CAD s/p CABG, DM, and HTN presents for cardiology followup.  She has seen Dr. Riley Kill in the past and is seeing me for the first time today.  I reviewed all her prior records.  Her husband died about a month ago.  Around that time, she had her first episode of chest pain in years, while walking across the street.  Over the last month, she has also developed significant exertional dyspnea.  She is short of breath walking 50-100 feet or with doing housework like making up a bed.  Any incline now gets her out of breath.  She has orthopnea and is sleeping on several pillows.  No chest pain since that one episode about a month ago.  No tachypalpitations or syncope.   Labs (3/13): K 4.2, creatinine 0.77  PMH: 1. GERD 2. Seizure disorder 3. Type II diabetes 4. Chronic LBBB 5. CAD: s/p CABG in 1992, had PCI several years ago.  Echo (2/11) with EF 55%, moderate LVH, aortic sclerosis.  6. Glaucoma 7. Hypothyroidism 8. HTN 9. Cholecystectomy  SH: Widow, lives in [place], does not smoke.   FH: No premature CAD.  Diabetes.   ROS: All systems reviewed and negative except as per HPI.   Current Outpatient Prescriptions  Medication Sig Dispense Refill  . amLODipine (NORVASC) 10 MG tablet Take 1 tablet (10 mg total) by mouth daily.  30 tablet  6  . aspirin 81 MG tablet Take 81 mg by mouth daily.      Marland Kitchen atorvastatin (LIPITOR) 80 MG tablet Take 80 mg by mouth daily.      . brimonidine-timolol (COMBIGAN) 0.2-0.5 % ophthalmic solution Place 1 drop into both eyes every 12 (twelve) hours.      . carvedilol (COREG) 6.25 MG tablet Take 1 tablet (6.25 mg total) by mouth 2 (two) times daily with a meal.  64 tablet  3  . HYDROcodone-acetaminophen (NORCO/VICODIN) 5-325 MG per tablet Take 1 tablet by mouth every 6 (six) hours as needed for pain.  10 tablet  0  . ibandronate (BONIVA) 150 MG tablet  Take 150 mg by mouth every 30 (thirty) days. Take in the morning with a full glass of water, on an empty stomach, and do not take anything else by mouth or lie down for the next 30 min.      . isosorbide mononitrate (IMDUR) 60 MG 24 hr tablet Take 120 mg by mouth 2 (two) times daily.      Marland Kitchen levothyroxine (SYNTHROID, LEVOTHROID) 112 MCG tablet Take 112 mcg by mouth daily.      Marland Kitchen loteprednol (LOTEMAX) 0.5 % ophthalmic suspension Place 1 drop into both eyes 2 (two) times daily.      . metFORMIN (GLUCOPHAGE-XR) 500 MG 24 hr tablet Take 500 mg by mouth 2 (two) times daily.      . nitroGLYCERIN (NITROSTAT) 0.4 MG SL tablet Place 0.4 mg under the tongue every 5 (five) minutes as needed.      Marland Kitchen omeprazole (PRILOSEC) 20 MG capsule Take 20 mg by mouth daily.      . phenytoin (DILANTIN) 100 MG ER capsule Take 200 mg by mouth 2 (two) times daily.       . prednisoLONE sodium phosphate (INFLAMASE FORTE) 1 % ophthalmic solution Place 1 drop into both eyes daily. Every other day      . furosemide (  LASIX) 40 MG tablet Take 1 tablet (40 mg total) by mouth daily.  30 tablet  3  . potassium chloride SA (K-DUR,KLOR-CON) 20 MEQ tablet Take 1 tablet (20 mEq total) by mouth daily.  30 tablet  3   No current facility-administered medications for this visit.    BP 128/78  Pulse 68  Ht 5\' 1"  (1.549 m)  Wt 197 lb (89.359 kg)  BMI 37.24 kg/m2 General: NAD Neck: JVP 8-9 cm, +HJR, no thyromegaly or thyroid nodule.  Lungs: Clear to auscultation bilaterally with normal respiratory effort. CV: Nondisplaced PMI.  Heart regular S1/S2, no S3/S4, 2/6 early SEM.  1+ edema 1/2 up lower legs bilaterally.  No carotid bruit.   Abdomen: Soft, nontender, no hepatosplenomegaly, no distention.  Skin: Intact without lesions or rashes.  Neurologic: Alert and oriented x 3.  Psych: Normal affect. Extremities: No clubbing or cyanosis.   Assessment/Plan: 1. CAD: Patient has history of CAD s/p CABG.  She had an episode of chest pain about  a month ago but has been having new-onset exertional dyspnea with mild to moderate exertion.  I am concerned that this could represent an anginal equivalent.  - Lexiscan Sestamibi for risk stratification.   - Continue ASA 81, stastin, Coreg.  2. CHF: Patient does appear volume overloaded on exam and has NYHA class III dyspnea.  - Echo to assess LV and RV function.  - Start Lasix 40 mg daily and increase KCl to 10 mEq daily.  - BMET/BNP today and in 2 wks.  3. Hyperlipidemia: Continue statin with known CAD.  Check lipids today.   Return to office in 2-3 weeks.   [name] 03/15/2013

## 2013-03-20 ENCOUNTER — Telehealth: Payer: Self-pay | Admitting: *Deleted

## 2013-03-20 NOTE — Telephone Encounter (Signed)
Christine Morale, MD ','<More Detail >>       Christine Morale, MD  Christine Gay     MRN: [id number] DOB: Feb 02, 1941     Pt Home: [phone]     Sent: Tue March 20, 2013 5:58 PM     To: Jacqlyn Krauss, RN                          Message    Echo with normal EF, inferior hypokinesis probably old based on prior CABG. Mild MR and AI. Overall ok.   Christine Morale, MD ','<More Detail >>       Christine Morale, MD  Christine Gay     MRN: [id number] DOB: 1941/07/11     Pt Home: [phone]     Sent: Tue March 20, 2013 5:58 PM     To: Jacqlyn Krauss, RN                          Message    Echo with normal EF, inferior hypokinesis probably old based on prior CABG. Mild MR and AI. Overall ok.   Christine Morale, MD ','<More Detail >>       Christine Morale, MD  Christine Gay     MRN: [id number] DOB: [birth year]     Pt Home: [phone]     Sent: Tue March 20, 2013 5:58 PM     To: Jacqlyn Krauss, RN                          Message    Echo with normal EF, inferior hypokinesis probably old based on prior CABG. Mild MR and AI. Overall ok.   03/20/13 Pt notified

## 2013-03-21 ENCOUNTER — Other Ambulatory Visit: Payer: Self-pay | Admitting: *Deleted

## 2013-03-21 DIAGNOSIS — R0600 Dyspnea, unspecified: Secondary | ICD-10-CM

## 2013-03-21 DIAGNOSIS — I5040 Unspecified combined systolic (congestive) and diastolic (congestive) heart failure: Secondary | ICD-10-CM

## 2013-03-21 DIAGNOSIS — I25810 Atherosclerosis of coronary artery bypass graft(s) without angina pectoris: Secondary | ICD-10-CM

## 2013-03-21 DIAGNOSIS — E7800 Pure hypercholesterolemia, unspecified: Secondary | ICD-10-CM

## 2013-03-21 MED ORDER — ISOSORBIDE MONONITRATE ER 60 MG PO TB24: 120.0000 mg | ORAL_TABLET | Freq: Two times a day (BID) | ORAL | Status: DC

## 2013-03-21 NOTE — Telephone Encounter (Signed)
Refill sent.

## 2013-03-27 ENCOUNTER — Ambulatory Visit (HOSPITAL_COMMUNITY): Payer: Medicare Other | Attending: Cardiology | Admitting: Radiology

## 2013-03-27 ENCOUNTER — Other Ambulatory Visit (INDEPENDENT_AMBULATORY_CARE_PROVIDER_SITE_OTHER): Payer: Medicare Other

## 2013-03-27 VITALS — BP 95/56 | Ht 61.0 in | Wt 195.0 lb

## 2013-03-27 DIAGNOSIS — R5381 Other malaise: Secondary | ICD-10-CM | POA: Insufficient documentation

## 2013-03-27 DIAGNOSIS — Z951 Presence of aortocoronary bypass graft: Secondary | ICD-10-CM | POA: Diagnosis not present

## 2013-03-27 DIAGNOSIS — E7800 Pure hypercholesterolemia, unspecified: Secondary | ICD-10-CM

## 2013-03-27 DIAGNOSIS — E785 Hyperlipidemia, unspecified: Secondary | ICD-10-CM | POA: Diagnosis not present

## 2013-03-27 DIAGNOSIS — I10 Essential (primary) hypertension: Secondary | ICD-10-CM | POA: Diagnosis not present

## 2013-03-27 DIAGNOSIS — R0600 Dyspnea, unspecified: Secondary | ICD-10-CM

## 2013-03-27 DIAGNOSIS — R0609 Other forms of dyspnea: Secondary | ICD-10-CM | POA: Diagnosis not present

## 2013-03-27 DIAGNOSIS — I25810 Atherosclerosis of coronary artery bypass graft(s) without angina pectoris: Secondary | ICD-10-CM

## 2013-03-27 DIAGNOSIS — R002 Palpitations: Secondary | ICD-10-CM | POA: Insufficient documentation

## 2013-03-27 DIAGNOSIS — R0989 Other specified symptoms and signs involving the circulatory and respiratory systems: Secondary | ICD-10-CM | POA: Insufficient documentation

## 2013-03-27 DIAGNOSIS — R079 Chest pain, unspecified: Secondary | ICD-10-CM | POA: Diagnosis not present

## 2013-03-27 DIAGNOSIS — I5040 Unspecified combined systolic (congestive) and diastolic (congestive) heart failure: Secondary | ICD-10-CM | POA: Diagnosis not present

## 2013-03-27 DIAGNOSIS — R5383 Other fatigue: Secondary | ICD-10-CM | POA: Insufficient documentation

## 2013-03-27 DIAGNOSIS — I2510 Atherosclerotic heart disease of native coronary artery without angina pectoris: Secondary | ICD-10-CM | POA: Diagnosis not present

## 2013-03-27 DIAGNOSIS — I447 Left bundle-branch block, unspecified: Secondary | ICD-10-CM | POA: Diagnosis not present

## 2013-03-27 LAB — BASIC METABOLIC PANEL
CO2: 28 mEq/L (ref 19–32)
Chloride: 103 mEq/L (ref 96–112)
Creatinine, Ser: 0.8 mg/dL (ref 0.4–1.2)
Sodium: 137 mEq/L (ref 135–145)

## 2013-03-27 MED ORDER — REGADENOSON 0.4 MG/5ML IV SOLN
0.4000 mg | Freq: Once | INTRAVENOUS | Status: AC
  Administered 2013-03-27: 0.4 mg via INTRAVENOUS

## 2013-03-27 MED ORDER — TECHNETIUM TC 99M SESTAMIBI GENERIC - CARDIOLITE
33.0000 | Freq: Once | INTRAVENOUS | Status: AC | PRN
  Administered 2013-03-27: 33 via INTRAVENOUS

## 2013-03-27 MED ORDER — TECHNETIUM TC 99M SESTAMIBI GENERIC - CARDIOLITE
11.0000 | Freq: Once | INTRAVENOUS | Status: AC | PRN
  Administered 2013-03-27: 11 via INTRAVENOUS

## 2013-03-27 NOTE — Progress Notes (Addendum)
[facility] SITE 3 NUCLEAR MED [address] [place], [place] [address] [phone]    Cardiology Nuclear Med Study  Christine Gay is a 72 y.o. female     MRN : [id number]     DOB: 1941-06-10  Procedure Date: 03/27/2013  Nuclear Med Background Indication for Stress Test:  Evaluation for Ischemia, Graft Patency and Stent Patency History:  '92 CABG '03 STENTS-SVG-OM2, '08 Heart Cath-EF: 55% SVG-OM2 chronic occ 70% CFX, 6/14 ECHO: EF; 55-65%  Cardiac Risk Factors: Hypertension and Lipids  Symptoms:  Chest Pain, DOE, Fatigue and Palpitations   Nuclear Pre-Procedure Caffeine/Decaff Intake:  None NPO After: 7:00pm   Lungs:  clear O2 Sat: 97% on room air. IV 0.9% NS with Angio Cath:  22g  IV Site: R Antecubital  IV Started by:  Cathlyn Parsons, RN  Chest Size (in):  44 Cup Size: C  Height: 5\' 1"  (1.549 m)  Weight:  195 lb (88.451 kg)  BMI:  Body mass index is 36.86 kg/(m^2). Tech Comments:  CBG this am 86 and no Metformin. IV infiltrated, restarted 22G  (R) AC, Irean Hong, RN.    Nuclear Med Study 1 or 2 day study: 1 day  Stress Test Type:  Adenosine  Reading MD: Olga Millers, MD  Order Authorizing Provider:  Fransico Meadow  Resting Radionuclide: Technetium 6m Sestamibi  Resting Radionuclide Dose: 11.0 mCi   Stress Radionuclide:  Technetium 62m Sestamibi  Stress Radionuclide Dose: 33.0 mCi           Stress Protocol Rest HR: 52 Stress HR: 73  Rest BP: 98/64 Stress BP: 95/45  Exercise Time (min): n/a METS: n/a   Predicted Max HR: 148 bpm % Max HR: 49.32 bpm Rate Pressure Product: 6935   Dose of Adenosine (mg):  49.6 Dose of Lexiscan: n/a mg  Dose of Atropine (mg): n/a Dose of Dobutamine: n/a mcg/kg/min (at max HR)  Stress Test Technologist: Milana Na, EMT-P  Nuclear Technologist:  Domenic Polite, CNMT     Rest Procedure:  Myocardial perfusion imaging was performed at rest 45 minutes following the intravenous administration of Technetium 6m  Sestamibi. Rest ECG: NSR-LBBB  Stress Procedure:  The patient received IV adenosine at 140 mcg/kg/min for 4 minutes.  Technetium 68m Sestamibi was injected at the 2 minute mark and quantitative spect images were obtained after a 45 minute delay. Stress ECG: Uninteretable due to baseline LBBB  QPS Raw Data Images:  Acquisition technically good; normal left ventricular size. Stress Images:  There is decreased uptake in the anterior wall. Rest Images:  There is decreased uptake in the anterior wall. Subtraction (SDS):  No evidence of ischemia. Transient Ischemic Dilatation (Normal <1.22):  n/a Lung/Heart Ratio (Normal <0.45):  0.43  Quantitative Gated Spect Images QGS EDV:  121 ml QGS ESV:  69 ml  Impression Exercise Capacity:  Adenosine study with no exercise. BP Response:  Normal blood pressure response. Clinical Symptoms:  There is chest pain. ECG Impression:  Baseline:  LBBB.  EKG uninterpretable due to LBBB at rest and stress. Comparison with Prior Nuclear Study: No images to compare  Overall Impression:  Intermediate risk stress nuclear study due to reduced LV function; there is a small, mild, fixed anterior defect consistent with soft tissue attenuation; no ischemia.  LV Ejection Fraction: 43%.  LV Wall Motion:  Mild global hypokinesis; suggest echocardiogram to better assess LV function.  Olga Millers  No ischemia but EF is low.  EF was normal on echo but there  was a wall motion abnormality.  Will need to see her back in office to decide on left heart cath.   [name] 03/28/2013

## 2013-03-28 NOTE — Progress Notes (Signed)
Pt.notified

## 2013-04-17 ENCOUNTER — Ambulatory Visit: Payer: Medicare Other | Admitting: Physician Assistant

## 2013-04-19 DIAGNOSIS — Z1231 Encounter for screening mammogram for malignant neoplasm of breast: Secondary | ICD-10-CM | POA: Diagnosis not present

## 2013-04-30 DIAGNOSIS — E119 Type 2 diabetes mellitus without complications: Secondary | ICD-10-CM | POA: Diagnosis not present

## 2013-04-30 DIAGNOSIS — I10 Essential (primary) hypertension: Secondary | ICD-10-CM | POA: Diagnosis not present

## 2013-04-30 DIAGNOSIS — E039 Hypothyroidism, unspecified: Secondary | ICD-10-CM | POA: Diagnosis not present

## 2013-04-30 DIAGNOSIS — E782 Mixed hyperlipidemia: Secondary | ICD-10-CM | POA: Diagnosis not present

## 2013-05-01 DIAGNOSIS — E1149 Type 2 diabetes mellitus with other diabetic neurological complication: Secondary | ICD-10-CM | POA: Diagnosis not present

## 2013-05-01 DIAGNOSIS — Q828 Other specified congenital malformations of skin: Secondary | ICD-10-CM | POA: Diagnosis not present

## 2013-05-01 DIAGNOSIS — L608 Other nail disorders: Secondary | ICD-10-CM | POA: Diagnosis not present

## 2013-05-02 ENCOUNTER — Ambulatory Visit (INDEPENDENT_AMBULATORY_CARE_PROVIDER_SITE_OTHER): Payer: Medicare Other | Admitting: Physician Assistant

## 2013-05-02 ENCOUNTER — Encounter: Payer: Self-pay | Admitting: Physician Assistant

## 2013-05-02 VITALS — BP 113/71 | HR 66 | Ht 62.0 in | Wt 193.0 lb

## 2013-05-02 DIAGNOSIS — I25810 Atherosclerosis of coronary artery bypass graft(s) without angina pectoris: Secondary | ICD-10-CM

## 2013-05-02 DIAGNOSIS — R079 Chest pain, unspecified: Secondary | ICD-10-CM | POA: Diagnosis not present

## 2013-05-02 DIAGNOSIS — I10 Essential (primary) hypertension: Secondary | ICD-10-CM | POA: Diagnosis not present

## 2013-05-02 DIAGNOSIS — E7800 Pure hypercholesterolemia, unspecified: Secondary | ICD-10-CM

## 2013-05-02 DIAGNOSIS — R0602 Shortness of breath: Secondary | ICD-10-CM | POA: Diagnosis not present

## 2013-05-02 DIAGNOSIS — I5032 Chronic diastolic (congestive) heart failure: Secondary | ICD-10-CM | POA: Diagnosis not present

## 2013-05-02 NOTE — Patient Instructions (Addendum)

## 2013-05-02 NOTE — H&P (Signed)
History and Physical  Date:  05/02/2013   ID:  Christine Gay, DOB 1941-06-26, MRN [id number]  PCP:  Christine Apo, MD  Cardiologist:  Dr. [name]     History of Present Illness: Christine Gay is a 72 y.o. female who returns for f/u after undergoing a recent stress test for CP.  She has a history of CAD, status post CABG, DM2, HTN, HL. She saw Dr. [name] for the first time in 03/09/13. Her husband died a month prior to this visit and she had her first episode of chest pain in years. She also noted increased exertional dyspnea. She was set up for a Lexiscan Myoview for risk stratification. She was noted to be volume overloaded on exam. She was started on Lasix. Echocardiogram demonstrated normal LV function. She did have inferior HK which appeared to be new. Adenosine Myoview 7/14 demonstrated an anterior defect consistent with soft tissue attenuation, no ischemia, EF 43%; intermediate risk. Patient was to follow up with Dr. [name] decide +/- proceeding with cardiac catheterization.  She has not noticed much of a difference in her DOE since starting the Lasix.  She tells me that she used to get short of breath with more moderate activities.  She now gets out of breath with light activity such as walking to her mailbox, etc.  She probably describes NYHA Class IIb-III symptoms.  She sleeps on 2 pillows.  No PND.  LE edema is about the same.  She has had one other episode of CP since seeing Dr. [name].  She had L upper chest tightness that radiated into her L arm.  This was like prior anginal symptoms. She had relief with 1 SL NTG. No syncope.    Labs (6/14):  K 4, Cr 0.8, proBNP 290, TC 161, TG 61, HDL 69, LDL 80 Labs (7/14):  K 4, Cr 0.8, proBNP 113  Wt Readings from Last 3 Encounters:  03/27/13 195 lb (88.451 kg)  03/13/13 197 lb (89.359 kg)  11/02/12 195 lb (88.451 kg)     Past Medical History  Diagnosis Date  . CAD (coronary artery disease)     a. s/p CABG 1992, b. s/p PCI  several years ago; c. LHC 11/08: L-LAD ok, S-OM1 ok, S-OM2 CTO, prox to mid CFX 70% (unchanged), RCA non-dominant, occluded;  d.  Echo 2/11: EF 55%, mod LVH, Ao sclerosis;  e. Echo 6/14: mild LVH, mild focal basal sept hypertrophy, EF 55-65%, inf HK, Mild AI, mild MR, mild BAE;  f. Myoview 7/14: int risk, ant defect c/w soft tissue atten, EF 43%, no ischemia  . Obesity   . Seizure disorder   . GERD (gastroesophageal reflux disease)   . LBBB (left bundle branch block)     chronic  . Glaucoma   . Hypothyroidism   . Hypertension   . Diabetes mellitus without complication    Past Surgical History  Procedure Laterality Date  . Coronary artery bypass graft    . Catarct removal    . Cholycystectomy    . Tubal litgation      bilateral     Current Outpatient Prescriptions  Medication Sig Dispense Refill  . amLODipine (NORVASC) 10 MG tablet Take 1 tablet (10 mg total) by mouth daily.  30 tablet  6  . aspirin 81 MG tablet Take 81 mg by mouth daily.      Marland Kitchen atorvastatin (LIPITOR) 80 MG tablet Take 80 mg by mouth daily.      . brimonidine-timolol (  COMBIGAN) 0.2-0.5 % ophthalmic solution Place 1 drop into both eyes every 12 (twelve) hours.      . carvedilol (COREG) 6.25 MG tablet Take 1 tablet (6.25 mg total) by mouth 2 (two) times daily with a meal.  64 tablet  3  . furosemide (LASIX) 40 MG tablet Take 1 tablet (40 mg total) by mouth daily.  30 tablet  3  . HYDROcodone-acetaminophen (NORCO/VICODIN) 5-325 MG per tablet Take 1 tablet by mouth every 6 (six) hours as needed for pain.  10 tablet  0  . ibandronate (BONIVA) 150 MG tablet Take 150 mg by mouth every 30 (thirty) days. Take in the morning with a full glass of water, on an empty stomach, and do not take anything else by mouth or lie down for the next 30 min.      . isosorbide mononitrate (IMDUR) 60 MG 24 hr tablet Take 2 tablets (120 mg total) by mouth 2 (two) times daily.  120 tablet  3  . levothyroxine (SYNTHROID, LEVOTHROID) 112 MCG tablet  Take 112 mcg by mouth daily.      Marland Kitchen loteprednol (LOTEMAX) 0.5 % ophthalmic suspension Place 1 drop into both eyes 2 (two) times daily.      . metFORMIN (GLUCOPHAGE-XR) 500 MG 24 hr tablet Take 500 mg by mouth 2 (two) times daily.      . nitroGLYCERIN (NITROSTAT) 0.4 MG SL tablet Place 0.4 mg under the tongue every 5 (five) minutes as needed.      Marland Kitchen omeprazole (PRILOSEC) 20 MG capsule Take 20 mg by mouth daily.      . phenytoin (DILANTIN) 100 MG ER capsule Take 200 mg by mouth 2 (two) times daily.       . potassium chloride SA (K-DUR,KLOR-CON) 20 MEQ tablet Take 1 tablet (20 mEq total) by mouth daily.  30 tablet  3  . prednisoLONE sodium phosphate (INFLAMASE FORTE) 1 % ophthalmic solution Place 1 drop into both eyes daily. Every other day       No current facility-administered medications for this visit.    Allergies:    Allergies  Allergen Reactions  . Meperidine Hcl     Demerol   . Morphine   . Nitroglycerin     Patch and Cream only    Social History:  The patient  reports that she has never smoked. She does not have any smokeless tobacco history on file.   Family History:  The patient's family history includes Diabetes in her mother.   ROS:  Please see the history of present illness.    All other systems reviewed and negative.   PHYSICAL EXAM: VS:  BP 113/71  Pulse 66  Ht 5\' 2"  (1.575 m)  Wt 193 lb (87.544 kg)  BMI 35.29 kg/m2 Well nourished, well developed, in no acute distress HEENT: normal Neck: no JVD Cardiac:  normal S1, S2; RRR; 1/6 systolic murmur RUSB Lungs:  clear to auscultation bilaterally, no wheezing, rhonchi or rales Abd: soft, nontender, no hepatomegaly Ext: 1+ bilateral LE edema Skin: warm and dry Neuro:  CNs 2-12 intact, no focal abnormalities noted  EKG:  NSR, HR 66, LBBB     ASSESSMENT AND PLAN:  1. CAD:  She had inferior HK on her echo which is new and an intermediate risk myoview given low EF but no ischemia.  She has had increasing amounts of  CP and worsening DOE.  I have reviewed her case today with Dr. [name].  As she continues to have  symptoms suggestive of angina, will arrange cardiac cath.  I will set her up for a right and left heart cath.  Risks and benefits of cardiac catheterization have been discussed with the patient.  These include bleeding, infection, kidney damage, stroke, heart attack, death.  The patient understands these risks and is willing to proceed.  She will remain on ASA, statin, beta blocker, nitrates. 2. Chronic Diastolic CHF:  She has chronic LE edema.  Volume appears stable.  Will see what her pressures are on right heart cath.  3. Hypertension:  Controlled.  Continue current therapy.  4. Hyperlipidemia:  Continue statin.  5. Disposition:  Proceed with cardiac cath as noted.  Follow up with Dr. [name] thereafter.  Of note, she is concerned about going home after her cath and may opt to remain in the hospital overnight.    Signed, Tereso Newcomer, PA-C  05/02/2013 4:09 PM

## 2013-05-02 NOTE — Progress Notes (Signed)
[address] [place], [place]  [address] Phone: [phone] Fax:  [phone]  Date:  05/02/2013   ID:  Christine Gay, DOB [birth year], MRN [id number]  PCP:  [name], MD  Cardiologist:  Dr. [name]     History of Present Illness: Christine Gay is a 72 y.o. female who returns for f/u after undergoing a recent stress test for CP.  She has a history of CAD, status post CABG, DM2, HTN, HL. She saw Dr. [name] for the first time in March 17, 2013. Her husband died a month prior to this visit and she had her first episode of chest pain in years. She also noted increased exertional dyspnea. She was set up for a Lexiscan Myoview for risk stratification. She was noted to be volume overloaded on exam. She was started on Lasix. Echocardiogram demonstrated normal LV function. She did have inferior HK which appeared to be new. Adenosine Myoview 7/14 demonstrated an anterior defect consistent with soft tissue attenuation, no ischemia, EF 43%; intermediate risk. Patient was to follow up with Dr. [name] decide +/- proceeding with cardiac catheterization.  She has not noticed much of a difference in her DOE since starting the Lasix.  She tells me that she used to get short of breath with more moderate activities.  She now gets out of breath with light activity such as walking to her mailbox, etc.  She probably describes NYHA Class IIb-III symptoms.  She sleeps on 2 pillows.  No PND.  LE edema is about the same.  She has had one other episode of CP since seeing Dr. [name].  She had L upper chest tightness that radiated into her L arm.  This was like prior anginal symptoms. She had relief with 1 SL NTG. No syncope.    Labs (6/14):  K 4, Cr 0.8, proBNP 290, TC 161, TG 61, HDL 69, LDL 80 Labs (7/14):  K 4, Cr 0.8, proBNP 113  Wt Readings from Last 3 Encounters:  03/27/13 195 lb (88.451 kg)  03/13/13 197 lb (89.359 kg)  11/02/12 195 lb (88.451 kg)     Past Medical History    Diagnosis Date  . CAD (coronary artery disease)     a. s/p CABG 1992, b. s/p PCI several years ago; c. LHC 11/08: L-LAD ok, S-OM1 ok, S-OM2 CTO, prox to mid CFX 70% (unchanged), RCA non-dominant, occluded;  d.  Echo 2/11: EF 55%, mod LVH, Ao sclerosis;  e. Echo 6/14: mild LVH, mild focal basal sept hypertrophy, EF 55-65%, inf HK, Mild AI, mild MR, mild BAE;  f. Myoview 7/14: int risk, ant defect c/w soft tissue atten, EF 43%, no ischemia  . Obesity   . Seizure disorder   . GERD (gastroesophageal reflux disease)   . LBBB (left bundle branch block)     chronic  . Glaucoma   . Hypothyroidism   . Hypertension   . Diabetes mellitus without complication    Past Surgical History  Procedure Laterality Date  . Coronary artery bypass graft    . Catarct removal    . Cholycystectomy    . Tubal litgation      bilateral     Current Outpatient Prescriptions  Medication Sig Dispense Refill  . amLODipine (NORVASC) 10 MG tablet Take 1 tablet (10 mg total) by mouth daily.  30 tablet  6  . aspirin 81 MG tablet Take 81 mg by mouth daily.      Marland Kitchen atorvastatin (LIPITOR) 80  MG tablet Take 80 mg by mouth daily.      . brimonidine-timolol (COMBIGAN) 0.2-0.5 % ophthalmic solution Place 1 drop into both eyes every 12 (twelve) hours.      . carvedilol (COREG) 6.25 MG tablet Take 1 tablet (6.25 mg total) by mouth 2 (two) times daily with a meal.  64 tablet  3  . furosemide (LASIX) 40 MG tablet Take 1 tablet (40 mg total) by mouth daily.  30 tablet  3  . HYDROcodone-acetaminophen (NORCO/VICODIN) 5-325 MG per tablet Take 1 tablet by mouth every 6 (six) hours as needed for pain.  10 tablet  0  . ibandronate (BONIVA) 150 MG tablet Take 150 mg by mouth every 30 (thirty) days. Take in the morning with a full glass of water, on an empty stomach, and do not take anything else by mouth or lie down for the next 30 min.      . isosorbide mononitrate (IMDUR) 60 MG 24 hr tablet Take 2 tablets (120 mg total) by mouth 2 (two)  times daily.  120 tablet  3  . levothyroxine (SYNTHROID, LEVOTHROID) 112 MCG tablet Take 112 mcg by mouth daily.      Marland Kitchen loteprednol (LOTEMAX) 0.5 % ophthalmic suspension Place 1 drop into both eyes 2 (two) times daily.      . metFORMIN (GLUCOPHAGE-XR) 500 MG 24 hr tablet Take 500 mg by mouth 2 (two) times daily.      . nitroGLYCERIN (NITROSTAT) 0.4 MG SL tablet Place 0.4 mg under the tongue every 5 (five) minutes as needed.      Marland Kitchen omeprazole (PRILOSEC) 20 MG capsule Take 20 mg by mouth daily.      . phenytoin (DILANTIN) 100 MG ER capsule Take 200 mg by mouth 2 (two) times daily.       . potassium chloride SA (K-DUR,KLOR-CON) 20 MEQ tablet Take 1 tablet (20 mEq total) by mouth daily.  30 tablet  3  . prednisoLONE sodium phosphate (INFLAMASE FORTE) 1 % ophthalmic solution Place 1 drop into both eyes daily. Every other day       No current facility-administered medications for this visit.    Allergies:    Allergies  Allergen Reactions  . Meperidine Hcl     Demerol   . Morphine   . Nitroglycerin     Patch and Cream only    Social History:  The patient  reports that she has never smoked. She does not have any smokeless tobacco history on file.   Family History:  The patient's family history includes Diabetes in her mother.   ROS:  Please see the history of present illness.    All other systems reviewed and negative.   PHYSICAL EXAM: VS:  BP 113/71  Pulse 66  Ht 5\' 2"  (1.575 m)  Wt 193 lb (87.544 kg)  BMI 35.29 kg/m2 Well nourished, well developed, in no acute distress HEENT: normal Neck: no JVD Cardiac:  normal S1, S2; RRR; 1/6 systolic murmur RUSB Lungs:  clear to auscultation bilaterally, no wheezing, rhonchi or rales Abd: soft, nontender, no hepatomegaly Ext: 1+ bilateral LE edema Skin: warm and dry Neuro:  CNs 2-12 intact, no focal abnormalities noted  EKG:  NSR, HR 66, LBBB     ASSESSMENT AND PLAN:  1. CAD:  She had inferior HK on her echo which is new and an  intermediate risk myoview given low EF but no ischemia.  She has had increasing amounts of CP and worsening DOE.  I  have reviewed her case today with Dr. [name].  As she continues to have symptoms suggestive of angina, will arrange cardiac cath.  I will set her up for a right and left heart cath.  Risks and benefits of cardiac catheterization have been discussed with the patient.  These include bleeding, infection, kidney damage, stroke, heart attack, death.  The patient understands these risks and is willing to proceed.  She will remain on ASA, statin, beta blocker, nitrates. 2. Chronic Diastolic CHF:  She has chronic LE edema.  Volume appears stable.  Will see what her pressures are on right heart cath.  3. Hypertension:  Controlled.  Continue current therapy.  4. Hyperlipidemia:  Continue statin.  5. Disposition:  Proceed with cardiac cath as noted.  Follow up with Dr. [name] thereafter.  Of note, she is concerned about going home after her cath and may opt to remain in the hospital overnight.    Signed, Tereso Newcomer, PA-C  05/02/2013 4:09 PM

## 2013-05-03 ENCOUNTER — Encounter (HOSPITAL_COMMUNITY): Payer: Self-pay | Admitting: Pharmacy Technician

## 2013-05-03 LAB — CBC WITH DIFFERENTIAL/PLATELET
Basophils Relative: 0.6 % (ref 0.0–3.0)
Eosinophils Relative: 2.8 % (ref 0.0–5.0)
HCT: 32.7 % — ABNORMAL LOW (ref 36.0–46.0)
Lymphs Abs: 1.8 10*3/uL (ref 0.7–4.0)
MCV: 85 fl (ref 78.0–100.0)
Monocytes Absolute: 0.7 10*3/uL (ref 0.1–1.0)
RBC: 3.84 Mil/uL — ABNORMAL LOW (ref 3.87–5.11)
WBC: 7.4 10*3/uL (ref 4.5–10.5)

## 2013-05-03 LAB — PROTIME-INR
INR: 1.4 ratio — ABNORMAL HIGH (ref 0.8–1.0)
Prothrombin Time: 15.1 s — ABNORMAL HIGH (ref 10.2–12.4)

## 2013-05-03 LAB — BASIC METABOLIC PANEL
Chloride: 103 mEq/L (ref 96–112)
Potassium: 3.9 mEq/L (ref 3.5–5.1)

## 2013-05-04 ENCOUNTER — Other Ambulatory Visit: Payer: Self-pay

## 2013-05-04 ENCOUNTER — Encounter (HOSPITAL_COMMUNITY)
Admission: RE | Disposition: A | Discharge status: Home / Self Care | Payer: Self-pay | Source: Ambulatory Visit | Attending: Cardiology

## 2013-05-04 ENCOUNTER — Ambulatory Visit (HOSPITAL_COMMUNITY)
Admission: RE | Admit: 2013-05-04 | Discharge: 2013-05-06 | Disposition: A | Discharge status: Home / Self Care | Payer: Medicare Other | Source: Ambulatory Visit | Attending: Cardiology | Admitting: Cardiology

## 2013-05-04 ENCOUNTER — Encounter (HOSPITAL_COMMUNITY): Payer: Self-pay | Admitting: General Practice

## 2013-05-04 DIAGNOSIS — E119 Type 2 diabetes mellitus without complications: Secondary | ICD-10-CM | POA: Diagnosis not present

## 2013-05-04 DIAGNOSIS — I447 Left bundle-branch block, unspecified: Secondary | ICD-10-CM | POA: Diagnosis not present

## 2013-05-04 DIAGNOSIS — D649 Anemia, unspecified: Secondary | ICD-10-CM | POA: Diagnosis not present

## 2013-05-04 DIAGNOSIS — I5033 Acute on chronic diastolic (congestive) heart failure: Secondary | ICD-10-CM | POA: Diagnosis not present

## 2013-05-04 DIAGNOSIS — I209 Angina pectoris, unspecified: Secondary | ICD-10-CM | POA: Insufficient documentation

## 2013-05-04 DIAGNOSIS — Z79899 Other long term (current) drug therapy: Secondary | ICD-10-CM | POA: Insufficient documentation

## 2013-05-04 DIAGNOSIS — H409 Unspecified glaucoma: Secondary | ICD-10-CM | POA: Diagnosis not present

## 2013-05-04 DIAGNOSIS — I25810 Atherosclerosis of coronary artery bypass graft(s) without angina pectoris: Secondary | ICD-10-CM

## 2013-05-04 DIAGNOSIS — Z6835 Body mass index (BMI) 35.0-35.9, adult: Secondary | ICD-10-CM | POA: Insufficient documentation

## 2013-05-04 DIAGNOSIS — E669 Obesity, unspecified: Secondary | ICD-10-CM | POA: Diagnosis not present

## 2013-05-04 DIAGNOSIS — R0602 Shortness of breath: Secondary | ICD-10-CM

## 2013-05-04 DIAGNOSIS — E039 Hypothyroidism, unspecified: Secondary | ICD-10-CM | POA: Insufficient documentation

## 2013-05-04 DIAGNOSIS — I2510 Atherosclerotic heart disease of native coronary artery without angina pectoris: Secondary | ICD-10-CM | POA: Insufficient documentation

## 2013-05-04 DIAGNOSIS — E785 Hyperlipidemia, unspecified: Secondary | ICD-10-CM | POA: Insufficient documentation

## 2013-05-04 DIAGNOSIS — I5040 Unspecified combined systolic (congestive) and diastolic (congestive) heart failure: Secondary | ICD-10-CM

## 2013-05-04 DIAGNOSIS — R0600 Dyspnea, unspecified: Secondary | ICD-10-CM

## 2013-05-04 DIAGNOSIS — I10 Essential (primary) hypertension: Secondary | ICD-10-CM | POA: Diagnosis not present

## 2013-05-04 DIAGNOSIS — K219 Gastro-esophageal reflux disease without esophagitis: Secondary | ICD-10-CM | POA: Diagnosis not present

## 2013-05-04 DIAGNOSIS — Z951 Presence of aortocoronary bypass graft: Secondary | ICD-10-CM | POA: Diagnosis not present

## 2013-05-04 DIAGNOSIS — E7800 Pure hypercholesterolemia, unspecified: Secondary | ICD-10-CM

## 2013-05-04 DIAGNOSIS — I509 Heart failure, unspecified: Secondary | ICD-10-CM | POA: Insufficient documentation

## 2013-05-04 DIAGNOSIS — R079 Chest pain, unspecified: Secondary | ICD-10-CM

## 2013-05-04 DIAGNOSIS — I5032 Chronic diastolic (congestive) heart failure: Secondary | ICD-10-CM

## 2013-05-04 DIAGNOSIS — G40909 Epilepsy, unspecified, not intractable, without status epilepticus: Secondary | ICD-10-CM | POA: Diagnosis not present

## 2013-05-04 HISTORY — DX: Anemia, unspecified: D64.9

## 2013-05-04 HISTORY — DX: Acute myocardial infarction, unspecified: I21.9

## 2013-05-04 HISTORY — DX: Unspecified convulsions: R56.9

## 2013-05-04 HISTORY — DX: Angina pectoris, unspecified: I20.9

## 2013-05-04 HISTORY — DX: Chronic diastolic (congestive) heart failure: I50.32

## 2013-05-04 HISTORY — PX: LEFT AND RIGHT HEART CATHETERIZATION WITH CORONARY ANGIOGRAM: SHX5449

## 2013-05-04 HISTORY — PX: PERCUTANEOUS STENT INTERVENTION: SHX5500

## 2013-05-04 LAB — GLUCOSE, CAPILLARY
Glucose-Capillary: 88 mg/dL (ref 70–99)
Glucose-Capillary: 132 mg/dL — ABNORMAL HIGH (ref 70–99)

## 2013-05-04 LAB — POCT ACTIVATED CLOTTING TIME: Activated Clotting Time: 211 seconds

## 2013-05-04 SURGERY — LEFT AND RIGHT HEART CATHETERIZATION WITH CORONARY ANGIOGRAM
Anesthesia: LOCAL

## 2013-05-04 MED ORDER — ISOSORBIDE MONONITRATE ER 60 MG PO TB24
120.0000 mg | ORAL_TABLET | Freq: Two times a day (BID) | ORAL | Status: DC
  Administered 2013-05-04 – 2013-05-06 (×3): 120 mg via ORAL
  Filled 2013-05-04 (×7): qty 2

## 2013-05-04 MED ORDER — LIDOCAINE HCL (PF) 1 % IJ SOLN
INTRAMUSCULAR | Status: AC
  Filled 2013-05-04: qty 30

## 2013-05-04 MED ORDER — CLOPIDOGREL BISULFATE 300 MG PO TABS
ORAL_TABLET | ORAL | Status: AC
  Filled 2013-05-04: qty 2

## 2013-05-04 MED ORDER — BIVALIRUDIN 250 MG IV SOLR
INTRAVENOUS | Status: AC
  Filled 2013-05-04: qty 250

## 2013-05-04 MED ORDER — HEPARIN (PORCINE) IN NACL 2-0.9 UNIT/ML-% IJ SOLN
INTRAMUSCULAR | Status: AC
  Filled 2013-05-04: qty 1000

## 2013-05-04 MED ORDER — CLOPIDOGREL BISULFATE 300 MG PO TABS: 600.0000 mg | ORAL_TABLET | Freq: Once | ORAL | Status: DC

## 2013-05-04 MED ORDER — ACETAMINOPHEN 325 MG PO TABS: 650.0000 mg | ORAL_TABLET | ORAL | Status: DC | PRN

## 2013-05-04 MED ORDER — HYDRALAZINE HCL 20 MG/ML IJ SOLN: 10.0000 mg | INTRAMUSCULAR | Status: DC | PRN

## 2013-05-04 MED ORDER — FENTANYL CITRATE 0.05 MG/ML IJ SOLN
INTRAMUSCULAR | Status: AC
  Filled 2013-05-04: qty 2

## 2013-05-04 MED ORDER — AMLODIPINE BESYLATE 10 MG PO TABS
10.0000 mg | ORAL_TABLET | Freq: Every day | ORAL | Status: DC
  Administered 2013-05-06: 10 mg via ORAL
  Filled 2013-05-04 (×3): qty 1

## 2013-05-04 MED ORDER — HEPARIN SODIUM (PORCINE) 1000 UNIT/ML IJ SOLN
INTRAMUSCULAR | Status: AC
  Filled 2013-05-04: qty 1

## 2013-05-04 MED ORDER — CARVEDILOL 6.25 MG PO TABS: 6.2500 mg | ORAL_TABLET | Freq: Two times a day (BID) | ORAL | Status: DC

## 2013-05-04 MED ORDER — BRIMONIDINE TARTRATE 0.2 % OP SOLN
1.0000 [drp] | Freq: Two times a day (BID) | OPHTHALMIC | Status: DC
  Administered 2013-05-04 – 2013-05-06 (×4): 1 [drp] via OPHTHALMIC
  Filled 2013-05-04 (×2): qty 5

## 2013-05-04 MED ORDER — PREDNISOLONE SODIUM PHOSPHATE 1 % OP SOLN
1.0000 [drp] | Freq: Every day | OPHTHALMIC | Status: DC
  Administered 2013-05-06: 1 [drp] via OPHTHALMIC
  Filled 2013-05-04: qty 10

## 2013-05-04 MED ORDER — POTASSIUM CHLORIDE CRYS ER 20 MEQ PO TBCR: 20.0000 meq | EXTENDED_RELEASE_TABLET | Freq: Every day | ORAL | Status: DC

## 2013-05-04 MED ORDER — ASPIRIN 81 MG PO CHEW
CHEWABLE_TABLET | ORAL | Status: AC
  Filled 2013-05-04: qty 4

## 2013-05-04 MED ORDER — ISOSORBIDE MONONITRATE ER 60 MG PO TB24: 120.0000 mg | ORAL_TABLET | Freq: Two times a day (BID) | ORAL | Status: DC

## 2013-05-04 MED ORDER — ONDANSETRON HCL 4 MG/2ML IJ SOLN: 4.0000 mg | Freq: Four times a day (QID) | INTRAMUSCULAR | Status: DC | PRN

## 2013-05-04 MED ORDER — SODIUM CHLORIDE 0.9 % IJ SOLN: 3.0000 mL | INTRAMUSCULAR | Status: DC | PRN

## 2013-05-04 MED ORDER — NITROGLYCERIN 0.2 MG/ML ON CALL CATH LAB
INTRAVENOUS | Status: AC
  Filled 2013-05-04: qty 1

## 2013-05-04 MED ORDER — ASPIRIN 81 MG PO CHEW
324.0000 mg | CHEWABLE_TABLET | ORAL | Status: AC
  Administered 2013-05-04: 324 mg via ORAL
  Filled 2013-05-04: qty 4

## 2013-05-04 MED ORDER — SODIUM CHLORIDE 0.9 % IV SOLN: 250.0000 mL | INTRAVENOUS | Status: DC | PRN

## 2013-05-04 MED ORDER — PHENYTOIN SODIUM EXTENDED 100 MG PO CAPS
100.0000 mg | ORAL_CAPSULE | Freq: Two times a day (BID) | ORAL | Status: DC
  Filled 2013-05-04: qty 2

## 2013-05-04 MED ORDER — ALUM & MAG HYDROXIDE-SIMETH 200-200-20 MG/5ML PO SUSP
ORAL | Status: AC
  Filled 2013-05-04: qty 30

## 2013-05-04 MED ORDER — SODIUM CHLORIDE 0.9 % IV SOLN: INTRAVENOUS | Status: AC

## 2013-05-04 MED ORDER — HEPARIN BOLUS VIA INFUSION: 3000.0000 [IU] | Freq: Once | INTRAVENOUS | Status: DC

## 2013-05-04 MED ORDER — PHENYTOIN SODIUM EXTENDED 100 MG PO CAPS
100.0000 mg | ORAL_CAPSULE | Freq: Every day | ORAL | Status: DC
  Administered 2013-05-04 – 2013-05-05 (×2): 100 mg via ORAL
  Filled 2013-05-04 (×4): qty 1

## 2013-05-04 MED ORDER — NITROGLYCERIN 0.4 MG SL SUBL: 0.4000 mg | SUBLINGUAL_TABLET | SUBLINGUAL | Status: DC | PRN

## 2013-05-04 MED ORDER — LEVOTHYROXINE SODIUM 100 MCG PO TABS
100.0000 ug | ORAL_TABLET | Freq: Every day | ORAL | Status: DC
  Administered 2013-05-05 – 2013-05-06 (×2): 100 ug via ORAL
  Filled 2013-05-04 (×3): qty 1

## 2013-05-04 MED ORDER — FUROSEMIDE 40 MG PO TABS: 40.0000 mg | ORAL_TABLET | Freq: Every day | ORAL | Status: DC

## 2013-05-04 MED ORDER — PHENYTOIN SODIUM EXTENDED 100 MG PO CAPS
200.0000 mg | ORAL_CAPSULE | Freq: Every day | ORAL | Status: DC
  Administered 2013-05-05 – 2013-05-06 (×2): 200 mg via ORAL
  Filled 2013-05-04 (×2): qty 2

## 2013-05-04 MED ORDER — MIDAZOLAM HCL 2 MG/2ML IJ SOLN
INTRAMUSCULAR | Status: AC
  Filled 2013-05-04: qty 2

## 2013-05-04 MED ORDER — CLOPIDOGREL BISULFATE 75 MG PO TABS
75.0000 mg | ORAL_TABLET | Freq: Every day | ORAL | Status: DC
  Administered 2013-05-05 – 2013-05-06 (×2): 75 mg via ORAL
  Filled 2013-05-04 (×3): qty 1

## 2013-05-04 MED ORDER — SODIUM CHLORIDE 0.9 % IJ SOLN: 3.0000 mL | Freq: Two times a day (BID) | INTRAMUSCULAR | Status: DC

## 2013-05-04 MED ORDER — SODIUM CHLORIDE 0.9 % IV SOLN: INTRAVENOUS | Status: DC

## 2013-05-04 MED ORDER — ATORVASTATIN CALCIUM 80 MG PO TABS
80.0000 mg | ORAL_TABLET | Freq: Every day | ORAL | Status: DC
  Administered 2013-05-04 – 2013-05-05 (×2): 80 mg via ORAL
  Filled 2013-05-04 (×3): qty 1

## 2013-05-04 MED ORDER — ASPIRIN 81 MG PO CHEW
81.0000 mg | CHEWABLE_TABLET | Freq: Every day | ORAL | Status: DC
  Administered 2013-05-05 – 2013-05-06 (×2): 81 mg via ORAL
  Filled 2013-05-04 (×2): qty 1

## 2013-05-04 MED ORDER — BRIMONIDINE TARTRATE-TIMOLOL 0.2-0.5 % OP SOLN: 1.0000 [drp] | Freq: Two times a day (BID) | OPHTHALMIC | Status: DC

## 2013-05-04 MED ORDER — TIMOLOL MALEATE 0.5 % OP SOLN
1.0000 [drp] | Freq: Two times a day (BID) | OPHTHALMIC | Status: DC
  Administered 2013-05-04 – 2013-05-06 (×4): 1 [drp] via OPHTHALMIC
  Filled 2013-05-04 (×2): qty 5

## 2013-05-04 MED ORDER — LOTEPREDNOL ETABONATE 0.5 % OP SUSP
1.0000 [drp] | Freq: Two times a day (BID) | OPHTHALMIC | Status: DC
  Administered 2013-05-04 – 2013-05-06 (×4): 1 [drp] via OPHTHALMIC
  Filled 2013-05-04 (×2): qty 5

## 2013-05-04 MED ORDER — POTASSIUM CHLORIDE CRYS ER 20 MEQ PO TBCR
20.0000 meq | EXTENDED_RELEASE_TABLET | Freq: Every day | ORAL | Status: DC
  Administered 2013-05-05 – 2013-05-06 (×2): 20 meq via ORAL
  Filled 2013-05-04: qty 2
  Filled 2013-05-04: qty 1

## 2013-05-04 MED ORDER — SODIUM CHLORIDE 0.9 % IV SOLN
INTRAVENOUS | Status: DC
  Administered 2013-05-04: 09:00:00 via INTRAVENOUS

## 2013-05-04 MED ORDER — ALUM & MAG HYDROXIDE-SIMETH 200-200-20 MG/5ML PO SUSP
30.0000 mL | Freq: Once | ORAL | Status: AC
  Administered 2013-05-04: 30 mL via ORAL

## 2013-05-04 MED ORDER — CARVEDILOL 6.25 MG PO TABS
6.2500 mg | ORAL_TABLET | Freq: Two times a day (BID) | ORAL | Status: DC
  Administered 2013-05-04 – 2013-05-06 (×4): 6.25 mg via ORAL
  Filled 2013-05-04: qty 2
  Filled 2013-05-04 (×6): qty 1

## 2013-05-04 NOTE — Interval H&P Note (Signed)
History and Physical Interval Note:  05/04/2013 10:35 AM  Christine Gay  has presented today for surgery, with the diagnosis of Chest pain  The various methods of treatment have been discussed with the patient and family. After consideration of risks, benefits and other options for treatment, the patient has consented to  Procedure(s): LEFT AND RIGHT HEART CATHETERIZATION WITH CORONARY ANGIOGRAM (N/A) as a surgical intervention .  The patient's history has been reviewed, patient examined, no change in status, stable for surgery.  I have reviewed the patient's chart and labs.  Questions were answered to the patient's satisfaction.     Saidy Ormand [employer]

## 2013-05-04 NOTE — CV Procedure (Signed)
   Cardiac Catheterization Operative Report  Christine Gay [id number] 8/8/20141:47 PM [name], MD  Procedure Performed:  1. PTCA/DES x 1 proximal Circumflex  Operator: Verne Carrow, MD  Indication:   72 yo female with CAD s/p CABG with recent worsening dyspnea and chest pain with minimal exertion felt to be c/w class III angina . Dr. [name] performed her left heart cath this am and she was found to have worsening of the severe stenosis in the ostium of the AV groove Circumflex artery which was felt to be the culprit.                           Procedure Details: The risks, benefits, complications, treatment options, and expected outcomes were discussed with the patient. The patient and/or family concurred with the proposed plan, giving informed consent. The patient was brought to the cath lab from the holding area with a 5 French sheath in place in her right femoral artery. The patient was further sedated with Versed and Fentanyl. The right groin was prepped and draped in the usual manner. Using the modified Seldinger access technique, sheath exchange was performed for a 6 French sheath. She was given a bolus of Angiomax and a drip was started. She had received 600 mg po Plavix x 1 before the PCI started. I then engaged the left main with a XB 3.0 guiding catheter. When the ACT was over 200, I began to try crossing the lesion. The lesion was very difficult to wire given the severity of the ostial stenosis and the takeoff from the proximal portion of the Circumflex followed by severe tortuosity. I was ultimately able to direct a Whisper wire into the AV groove Circumflex artery beyond the severe stenosis. A 2.0 x 12 mm balloon was used to pre-dilate the stenosis. I then carefully positioned and deployed a 3.0 x 12 mm Promus Premier DES in the proximal Circumflex artery extending into the large AV groove Circumflex. The stent was post-dilated with a 3.0 x 8 mm [place] balloon x 1. The  stenosis was taken from 99% down to 0%. There were no immediate complications. The patient was taken to the recovery area in stable condition.   Hemodynamic Findings: Central aortic pressure: 144/62  Impression: 1. Severe CAD as described in diagnostic cath report 2. Class III angina felt to be related to severe stenosis in proximal Circumflex.  3. Successful PTCA/DES x proximal Circumflex artery  Recommendations: She will need dual anti-platelet therapy with ASA and Plavix for at least one year. Continue other cardiac meds. Probable discharge home in am.        Complications:  None; patient tolerated the procedure well.

## 2013-05-04 NOTE — CV Procedure (Signed)
   Cardiac Catheterization Procedure Note  Name: Christine Gay MRN: [id number] DOB: 1941/05/02  Procedure: Left Heart Cath, Selective Coronary Angiography, LV angiography, LIMA angiography, SVG angiography  Indication: Exertional dyspnea and chest pain, [place] Class III anginal equivalent symptoms and intermediate risk stress test.    Procedural details: The right groin was prepped, draped, and anesthetized with 1% lidocaine. Using modified Seldinger technique, a 5 French sheath was introduced into the right femoral artery. Standard Judkins catheters, MP catheter, and LIMA catheter were used for coronary angiography, LIMA angiography, SVG angiography and left ventriculography. Catheter exchanges were performed over a guidewire. There were no immediate procedural complications. The patient was transferred to the post catheterization recovery area for further monitoring.  Procedural Findings: Hemodynamics:  AO 112/48 LV 114/14   Coronary angiography: Coronary dominance: right  Left mainstem: Calcified with 40% distal stenosis.   Left anterior descending (LAD): 90% proximal stenosis followed by total occlusion.  LIMA-LAD patent with good flow to mid to distal LAD.   Left circumflex (LCx): SVG-OM1 occluded (known from prior cath).  SVG-OM2 patent with 25% proximal stenosis.  There was an early OM1 off the LCx that was occluded in the proximal to mid vessel.  Just after OM1, there was 95% stenosis in the AV LCx.  The AV LCx was a large dominant vessel.    Right coronary artery (RCA): Nondominant vessel, occluded proximally with some right to right collaterals off an early acute marginal.   Left ventriculography: Hand ventriculogram done, EF appears to be about 55%.   Final Conclusions:  Coronary and bypass graft anatomy similar compared to prior study in 2008.  The proximal LCx stenosis, however, does appear to be progressive.  70% stenosis prior, now about 95% stenosis.  This is a large  vessel.  As above, she has [place] Class III anginal equivalent symptoms on amlodipine, coreg, and Imdur.  She had an intermediate risk stress test.  I think that intervention to her LCx is our best option here.  Dr. Clifton James will do the procedure today.  I will give her 600 mg Plavix.   [name] 05/04/2013, 11:25 AM

## 2013-05-04 NOTE — Telephone Encounter (Signed)
Ordered Medications   furosemide (LASIX) 40 MG tablet 30 tablet 3 03/13/2013    Take 1 tablet (40 mg total) by mouth daily. - Oral  isosorbide mononitrate (IMDUR) 60 MG 24 hr tablet  Take 120 mg by mouth 2 (two) times daily.      potassium chloride SA (K-DUR,KLOR-CON) 20 MEQ tablet  Take 1 tablet (20 mEq total) by mouth daily.   30 tablet   3   carvedilol (COREG) 6.25 MG tablet  Take 1 tablet (6.25 mg total) by mouth 2 (two) times daily with a meal.   64 tablet   3   Laurey Morale, MD at 03/15/2013  1:40 AM

## 2013-05-05 ENCOUNTER — Encounter (HOSPITAL_COMMUNITY): Payer: Self-pay | Admitting: Physician Assistant

## 2013-05-05 DIAGNOSIS — I5033 Acute on chronic diastolic (congestive) heart failure: Secondary | ICD-10-CM | POA: Diagnosis not present

## 2013-05-05 DIAGNOSIS — R0602 Shortness of breath: Secondary | ICD-10-CM | POA: Diagnosis not present

## 2013-05-05 DIAGNOSIS — R079 Chest pain, unspecified: Secondary | ICD-10-CM | POA: Diagnosis not present

## 2013-05-05 DIAGNOSIS — I25810 Atherosclerosis of coronary artery bypass graft(s) without angina pectoris: Secondary | ICD-10-CM | POA: Diagnosis not present

## 2013-05-05 DIAGNOSIS — I509 Heart failure, unspecified: Secondary | ICD-10-CM | POA: Diagnosis not present

## 2013-05-05 DIAGNOSIS — I5032 Chronic diastolic (congestive) heart failure: Secondary | ICD-10-CM

## 2013-05-05 DIAGNOSIS — I2510 Atherosclerotic heart disease of native coronary artery without angina pectoris: Secondary | ICD-10-CM | POA: Diagnosis not present

## 2013-05-05 DIAGNOSIS — I209 Angina pectoris, unspecified: Secondary | ICD-10-CM | POA: Diagnosis not present

## 2013-05-05 LAB — GLUCOSE, CAPILLARY
Glucose-Capillary: 100 mg/dL — ABNORMAL HIGH (ref 70–99)
Glucose-Capillary: 119 mg/dL — ABNORMAL HIGH (ref 70–99)

## 2013-05-05 LAB — CBC
HCT: 28 % — ABNORMAL LOW (ref 36.0–46.0)
Hemoglobin: 9.4 g/dL — ABNORMAL LOW (ref 12.0–15.0)
MCHC: 33.6 g/dL (ref 30.0–36.0)
MCV: 81.6 fL (ref 78.0–100.0)
RDW: 15.3 % (ref 11.5–15.5)
WBC: 6.8 10*3/uL (ref 4.0–10.5)

## 2013-05-05 LAB — BASIC METABOLIC PANEL
BUN: 7 mg/dL (ref 6–23)
Chloride: 106 mEq/L (ref 96–112)
Creatinine, Ser: 0.83 mg/dL (ref 0.50–1.10)
Glucose, Bld: 126 mg/dL — ABNORMAL HIGH (ref 70–99)
Potassium: 3.5 mEq/L (ref 3.5–5.1)

## 2013-05-05 LAB — PRO B NATRIURETIC PEPTIDE: Pro B Natriuretic peptide (BNP): 971.2 pg/mL — ABNORMAL HIGH (ref 0–125)

## 2013-05-05 MED ORDER — POTASSIUM CHLORIDE CRYS ER 20 MEQ PO TBCR
20.0000 meq | EXTENDED_RELEASE_TABLET | Freq: Once | ORAL | Status: AC
  Administered 2013-05-05: 12:00:00 20 meq via ORAL

## 2013-05-05 MED ORDER — FUROSEMIDE 10 MG/ML IJ SOLN
60.0000 mg | Freq: Once | INTRAMUSCULAR | Status: AC
  Administered 2013-05-05: 60 mg via INTRAVENOUS
  Filled 2013-05-05: qty 6

## 2013-05-05 NOTE — Progress Notes (Signed)
pBNP elevated. Dyspneic with movement in bed. D/w Dr. Tenny Craw. Will rx Lasix 60mg  IV this morning and follow volume status. F/u labs including CBC BMET in AM. No reported bleeding. Curtez Brallier PA-C

## 2013-05-05 NOTE — Discharge Summary (Signed)
Discharge Summary   Patient ID: Christine Gay MRN: [id number], DOB/AGE: 72-Aug-1942 72 y.o. Admit date: 05/04/2013 D/C date:     05/06/2013  Primary Cardiologist: [name]  Primary Discharge Diagnoses:  1. CAD/[place] Class III anginal equivalent - s/p PTCA/DES to prox Cx 05/04/13 - history: CABG 1992, PCI several years ago 2. Diabetes mellitus 3. Acute on chronic diastolic CHF 4. HTN 5. Anemia, normocytic  Secondary Discharge Diagnoses:  1. Obesity 2. Seizure disorder 3. GERD 4. LBBB 5. Glaucoma 6. Hypothyroidism  Hospital Course:  Christine Gay is a 72 y/o F with histroy of CAD, status post CABG, DM2, HTN, HL. She saw Dr. [name] for the first time in 2013/04/04. Her husband died a month prior to this visit and she had her first episode of chest pain in years. She also noted increased exertional dyspnea. She was set up for a Lexiscan Myoview for risk stratification. She was noted to be volume overloaded on exam. She was started on Lasix. Echocardiogram demonstrated normal LV function. She did have inferior HK which appeared to be new. Adenosine Myoview 7/14 demonstrated an anterior defect consistent with soft tissue attenuation, no ischemia, EF 43%; intermediate risk. Patient was to follow up in office decide +/- proceeding with cardiac catheterization. She had not noticed much of a difference in her DOE since starting the Lasix and also had another episode of CP radiating into her L arm like prior anginal symptoms. Cardiac cath was recommended. She underwent this 05/04/13 demonstrating bypass graft anatomy similar compared to prior study in 2008. However, proximal LCx stenosis did appear to be progressive - 70% prior, now 95% (large vessel). She was felt to have [place] Class III anginal equivalent symptoms on amlodipine, coreg, and Imdur. In light of her intermediate risk stress test, PCI to LCx was recommended. She underwent PTCA/DES x 1 to prox Cx. DAPT with ASA & Plavix for at least one year was  recommended. Metformin will be held x 72 hrs post-cath. Due to an episode of hypoglycemia noted this admission (although in setting of being NPO at times), she was instructed to check CBG at home and call her PCP if she gets low readings as her meds may need to be adjusted. Due to some residual dyspnea post-cath pBNP was obtained which was elevated. She stayed for a day for IV Lasix which improved her symptoms. Dr. Tenny Craw has seen and examined the patient today and feels she is stable for discharge. I have left a message on our office's scheduling voicemail requesting a follow-up appointment, and our office will call the patient with this appointment.  She was also found to be anemic this admission with Hgb 9.4-9.8. She denied any bleeding including BRBPR, melena, hematemesis or hematuria. Pre-cath value was 10.4. She was instructed to call her primary doctor on Monday to discuss further monitoring and evaluation.  She was told to notify her doctor immediately if she does notice any bleeding. Prilosec was changed to protonix given Plavix use.  Discharge Vitals: Blood pressure 130/64, pulse 63, temperature 98.2 F (36.8 C), temperature source Oral, resp. rate 18, height 5' (1.524 m), weight 188 lb 15 oz (85.7 kg), SpO2 100.00%.  Labs: Lab Results  Component Value Date   WBC 5.7 05/06/2013   HGB 9.8* 05/06/2013   HCT 29.0* 05/06/2013   MCV 81.7 05/06/2013   PLT 167 05/06/2013     Recent Labs Lab 05/06/13 0410  NA 136  K 3.4*  CL 102  CO2 24  BUN  9  CREATININE 0.89  CALCIUM 8.1*  GLUCOSE 164*    Lab Results  Component Value Date   CHOL 161 03/13/2013   HDL 69.30 03/13/2013   LDLCALC 80 03/13/2013   TRIG 61.0 03/13/2013    Diagnostic Studies/Procedures   Cardiac catheterization this admission, please see full report and above for summary.   Discharge Medications     Medication List    STOP taking these medications       omeprazole 40 MG capsule  Commonly known as:  PRILOSEC        TAKE these medications       amLODipine 10 MG tablet  Commonly known as:  NORVASC  Take 1 tablet (10 mg total) by mouth daily.     aspirin 81 MG tablet  Take 81 mg by mouth daily.     atorvastatin 80 MG tablet  Commonly known as:  LIPITOR  Take 80 mg by mouth daily.     CALCIUM 600 PO  Take 1 tablet by mouth daily.     carvedilol 6.25 MG tablet  Commonly known as:  COREG  Take 1 tablet (6.25 mg total) by mouth 2 (two) times daily with a meal.     clopidogrel 75 MG tablet  Commonly known as:  PLAVIX  Take 1 tablet (75 mg total) by mouth daily.     COMBIGAN 0.2-0.5 % ophthalmic solution  Generic drug:  brimonidine-timolol  Place 1 drop into both eyes every 12 (twelve) hours.     furosemide 40 MG tablet  Commonly known as:  LASIX  Take 1 tablet (40 mg total) by mouth daily.     ibandronate 150 MG tablet  Commonly known as:  BONIVA  Take 150 mg by mouth every 30 (thirty) days. Take in the morning with a full glass of water, on an empty stomach, and do not take anything else by mouth or lie down for the next 30 min.     isosorbide mononitrate 60 MG 24 hr tablet  Commonly known as:  IMDUR  Take 2 tablets (120 mg total) by mouth 2 (two) times daily.     levothyroxine 100 MCG tablet  Commonly known as:  SYNTHROID, LEVOTHROID  Take 100 mcg by mouth daily before breakfast.     LOTEMAX 0.5 % ophthalmic suspension  Generic drug:  loteprednol  Place 1 drop into both eyes 2 (two) times daily.     metFORMIN 500 MG 24 hr tablet  Commonly known as:  GLUCOPHAGE-XR  Take 1 tablet (500 mg total) by mouth 2 (two) times daily.  Start taking on:  05/08/2013     multivitamin with minerals tablet  Take 1 tablet by mouth daily.     nitroGLYCERIN 0.4 MG SL tablet  Commonly known as:  NITROSTAT  Place 0.4 mg under the tongue every 5 (five) minutes as needed for chest pain.     pantoprazole 40 MG tablet  Commonly known as:  PROTONIX  Take 2 tablets (80 mg total) by mouth daily.      phenytoin 100 MG ER capsule  Commonly known as:  DILANTIN  Take 100-200 mg by mouth 2 (two) times daily. Takes 2 tablets in am and 1 tablet in pm     potassium chloride SA 20 MEQ tablet  Commonly known as:  K-DUR,KLOR-CON  Take 1 tablet (20 mEq total) by mouth daily.     prednisoLONE sodium phosphate 1 % ophthalmic solution  Commonly known as:  INFLAMASE FORTE  Place  1 drop into both eyes daily. Every other day        Disposition   The patient will be discharged in stable condition to home. Discharge Orders   Future Orders Complete By Expires     Diet - low sodium heart healthy  As directed     Comments:      Diabetic Diet    Discharge instructions  As directed     Comments:      Your blood sugar was intermittently low this admission. Please make sure you are checking it regularly and call your doctor if it runs low, since your medicine may need to be adjusted.  Call your doctor if you notice any unusual bleeding.  Some studies suggest that Prilosec (omeprazole) can interact with Plavix. We changed your omeprazole to Protonix instead.    Increase activity slowly  As directed     Comments:      No driving for 2 days. No lifting over 5 lbs for 1 week. No sexual activity for 1 week. Keep procedure site clean & dry. If you notice increased pain, swelling, bleeding or pus, call/return!  You may shower, but no soaking baths/hot tubs/pools for 1 week.      Follow-up Information   Follow up with [name], MD. (Call your primary doctor on Monday 05/07/13 to discuss further monitoring of your anemia (low blood count).)    Contact information:   [address] [place] [address] [phone]       Follow up with [name], MD. (Our office will call you for a follow-up appointment. Please call the office if you have not heard from [place] within 3 days.)    Contact information:   [address] [place] [address] [phone]          Duration of Discharge Encounter: Greater than 30 minutes including physician and PA time.  Signed, Ronie Spies PA-C 05/06/2013, 9:41 AM

## 2013-05-05 NOTE — Progress Notes (Signed)
Subjective: NO CP Does get SOB with movement in room Objective: Filed Vitals:   05/04/13 2008 05/04/13 2335 05/04/13 2337 05/05/13 0349  BP: 161/70 85/40 112/56 118/52  Pulse: 53 51  64  Temp: 97.8 F (36.6 C) 98.4 F (36.9 C)  98.9 F (37.2 C)  TempSrc: Oral Oral  Oral  Resp: 18 18  18   Height:      Weight:      SpO2: 95% 100%  100%   Weight change:   Intake/Output Summary (Last 24 hours) at 05/05/13 0743 Last data filed at 05/05/13 0154  Gross per 24 hour  Intake    520 ml  Output    250 ml  Net    270 ml    General: Alert, awake, oriented x3, in no acute distress Neck:  JVP is normal Heart: Regular rate and rhythm, without murmurs, rubs, gallops.  Lungs: Clear to auscultation.  No rales or wheezes. Exemities:  No edema.  R groin without hematoma or bruit.  Bandage dry. Neuro: Grossly intact, nonfocal.   Lab Results: Results for orders placed during the hospital encounter of 05/04/13 (from the past 24 hour(s))  GLUCOSE, CAPILLARY     Status: Abnormal   Collection Time    05/04/13  9:11 AM      Result Value Range   Glucose-Capillary 123 (*) 70 - 99 mg/dL  GLUCOSE, CAPILLARY     Status: None   Collection Time    05/04/13 12:23 PM      Result Value Range   Glucose-Capillary 88  70 - 99 mg/dL  POCT ACTIVATED CLOTTING TIME     Status: None   Collection Time    05/04/13  1:25 PM      Result Value Range   Activated Clotting Time 386    POCT ACTIVATED CLOTTING TIME     Status: None   Collection Time    05/04/13  3:43 PM      Result Value Range   Activated Clotting Time 211    GLUCOSE, CAPILLARY     Status: Abnormal   Collection Time    05/04/13  5:23 PM      Result Value Range   Glucose-Capillary 44 (*) 70 - 99 mg/dL   Comment 1 Notify RN    POCT ACTIVATED CLOTTING TIME     Status: None   Collection Time    05/04/13  5:29 PM      Result Value Range   Activated Clotting Time 176    GLUCOSE, CAPILLARY     Status: Abnormal   Collection Time    05/04/13   9:37 PM      Result Value Range   Glucose-Capillary 132 (*) 70 - 99 mg/dL  CBC     Status: Abnormal   Collection Time    05/05/13  4:10 AM      Result Value Range   WBC 6.8  4.0 - 10.5 K/uL   RBC 3.43 (*) 3.87 - 5.11 MIL/uL   Hemoglobin 9.4 (*) 12.0 - 15.0 g/dL   HCT 78.2 (*) 95.6 - 21.3 %   MCV 81.6  78.0 - 100.0 fL   MCH 27.4  26.0 - 34.0 pg   MCHC 33.6  30.0 - 36.0 g/dL   RDW 08.6  57.8 - 46.9 %   Platelets 169  150 - 400 K/uL  BASIC METABOLIC PANEL     Status: Abnormal   Collection Time    05/05/13  4:10 AM  Result Value Range   Sodium 139  135 - 145 mEq/L   Potassium 3.5  3.5 - 5.1 mEq/L   Chloride 106  96 - 112 mEq/L   CO2 23  19 - 32 mEq/L   Glucose, Bld 126 (*) 70 - 99 mg/dL   BUN 7  6 - 23 mg/dL   Creatinine, Ser 8.11  0.50 - 1.10 mg/dL   Calcium 8.2 (*) 8.4 - 10.5 mg/dL   GFR calc non Af Amer 69 (*) >90 mL/min   GFR calc Af Amer 80 (*) >90 mL/min    Studies/Results: @RISRSLT24 @  Medications: Reviewed   @PROBHOSP @  1.  CAD  S/p PTCA/DES to LCx  Plan dual antiplatelet Rx for at least 1 year.  2  HL  COntinue lipitor  3.  DM  Hold metformin for 3 days   4.  Diastolic CHF  Volume status looks pretty good  Patient gets very winded just with turning in bed  I/O incomplete Will check BNP  Ambulate  If does OK then d/c today  5.  HTN  BP is labile  Asymptomatic  Follow.  Keep on same regimen.    LOS: 1 day   Dietrich Pates 05/05/2013, 7:43 AM

## 2013-05-05 NOTE — Progress Notes (Signed)
CARDIAC REHAB PHASE I   PRE:  Rate/Rhythm: 59 SR PVCs  BP:  Supine:   Sitting: 120/48  Standing:    SaO2: 99% RA  MODE:  Ambulation: 600 ft   POST:  Rate/Rhythm: 74 SR  BP:  Supine:   Sitting: 137/52  Standing:    SaO2: 98% RA  1191-4782 Very pleasant and motivated to walk this morning. Patient tolerated ambulation well with assist x 1 and using walking cane. PCI education complete including CP, NTG use & calling 911, restrictions, risk factor modification, heart healthy eating, and activity progression. Pt voices understanding of instructions given. Discussed Phase 2 CR and pt is interested, but may have issue with transportation. Permission given to send contact info to CR at [facility].  Cristy Hilts, MS, ACSM CES

## 2013-05-06 DIAGNOSIS — I5032 Chronic diastolic (congestive) heart failure: Secondary | ICD-10-CM | POA: Diagnosis not present

## 2013-05-06 DIAGNOSIS — R079 Chest pain, unspecified: Secondary | ICD-10-CM | POA: Diagnosis not present

## 2013-05-06 DIAGNOSIS — I25810 Atherosclerosis of coronary artery bypass graft(s) without angina pectoris: Secondary | ICD-10-CM | POA: Diagnosis not present

## 2013-05-06 LAB — GLUCOSE, CAPILLARY
Glucose-Capillary: 145 mg/dL — ABNORMAL HIGH (ref 70–99)
Glucose-Capillary: 100 mg/dL — ABNORMAL HIGH (ref 70–99)

## 2013-05-06 LAB — BASIC METABOLIC PANEL
BUN: 9 mg/dL (ref 6–23)
Chloride: 102 mEq/L (ref 96–112)
GFR calc Af Amer: 73 mL/min — ABNORMAL LOW (ref >90)
GFR calc non Af Amer: 63 mL/min — ABNORMAL LOW (ref >90)
Potassium: 3.4 mEq/L — ABNORMAL LOW (ref 3.5–5.1)
Sodium: 136 mEq/L (ref 135–145)

## 2013-05-06 LAB — CBC
HCT: 29 % — ABNORMAL LOW (ref 36.0–46.0)
Hemoglobin: 9.8 g/dL — ABNORMAL LOW (ref 12.0–15.0)
MCHC: 33.8 g/dL (ref 30.0–36.0)
RDW: 15.3 % (ref 11.5–15.5)
WBC: 5.7 10*3/uL (ref 4.0–10.5)

## 2013-05-06 LAB — PRO B NATRIURETIC PEPTIDE: Pro B Natriuretic peptide (BNP): 420 pg/mL — ABNORMAL HIGH (ref 0–125)

## 2013-05-06 MED ORDER — POTASSIUM CHLORIDE CRYS ER 20 MEQ PO TBCR
20.0000 meq | EXTENDED_RELEASE_TABLET | Freq: Once | ORAL | Status: AC
  Administered 2013-05-06: 20 meq via ORAL
  Filled 2013-05-06: qty 1

## 2013-05-06 MED ORDER — METFORMIN HCL ER 500 MG PO TB24: 500.0000 mg | ORAL_TABLET | Freq: Two times a day (BID) | ORAL | Status: DC

## 2013-05-06 MED ORDER — PANTOPRAZOLE SODIUM 40 MG PO TBEC: 80.0000 mg | DELAYED_RELEASE_TABLET | Freq: Every day | ORAL | Status: DC

## 2013-05-06 MED ORDER — CLOPIDOGREL BISULFATE 75 MG PO TABS: 75.0000 mg | ORAL_TABLET | Freq: Every day | ORAL | Status: DC

## 2013-05-06 NOTE — Progress Notes (Signed)
Subjective: Patient feeling good  No CP  SOme SOB with talking (longstanding)  Overall feeling much better. Objective: Filed Vitals:   05/05/13 1230 05/05/13 1357 05/05/13 2032 05/06/13 0704  BP: 155/78 93/60 127/79 130/64  Pulse: 60 66 57 63  Temp: 98.6 F (37 C) 98.1 F (36.7 C) 98 F (36.7 C) 98.2 F (36.8 C)  TempSrc: Oral Oral Oral Oral  Resp: 18 18 18 18   Height: 5' (1.524 m)     Weight: 189 lb 9.5 oz (86 kg)   188 lb 15 oz (85.7 kg)  SpO2: 97% 100% 95% 100%   Weight change: -3 lb 6.5 oz (-1.544 kg)  Intake/Output Summary (Last 24 hours) at 05/06/13 0726 Last data filed at 05/05/13 1900  Gross per 24 hour  Intake    800 ml  Output   1050 ml  Net   -250 ml    General: Alert, awake, oriented x3, in no acute distress Neck:  JVP is normal Heart: Regular rate and rhythm, without murmurs, rubs, gallops.  Lungs: Clear to auscultation.  No rales or wheezes. Exemities:  No edema.   Neuro: Grossly intact, nonfocal.  I/O incomplete Lab Results: Results for orders placed during the hospital encounter of 05/04/13 (from the past 24 hour(s))  PRO B NATRIURETIC PEPTIDE     Status: Abnormal   Collection Time    05/05/13  8:05 AM      Result Value Range   Pro B Natriuretic peptide (BNP) 971.2 (*) 0 - 125 pg/mL  GLUCOSE, CAPILLARY     Status: Abnormal   Collection Time    05/05/13  8:11 AM      Result Value Range   Glucose-Capillary 100 (*) 70 - 99 mg/dL   Comment 1 Notify RN    GLUCOSE, CAPILLARY     Status: Abnormal   Collection Time    05/05/13 12:41 PM      Result Value Range   Glucose-Capillary 105 (*) 70 - 99 mg/dL  GLUCOSE, CAPILLARY     Status: Abnormal   Collection Time    05/05/13  4:36 PM      Result Value Range   Glucose-Capillary 119 (*) 70 - 99 mg/dL  GLUCOSE, CAPILLARY     Status: None   Collection Time    05/05/13  8:57 PM      Result Value Range   Glucose-Capillary 92  70 - 99 mg/dL   Comment 1 Notify RN    CBC     Status: Abnormal   Collection  Time    05/06/13  4:10 AM      Result Value Range   WBC 5.7  4.0 - 10.5 K/uL   RBC 3.55 (*) 3.87 - 5.11 MIL/uL   Hemoglobin 9.8 (*) 12.0 - 15.0 g/dL   HCT 08.6 (*) 57.8 - 46.9 %   MCV 81.7  78.0 - 100.0 fL   MCH 27.6  26.0 - 34.0 pg   MCHC 33.8  30.0 - 36.0 g/dL   RDW 62.9  52.8 - 41.3 %   Platelets 167  150 - 400 K/uL  BASIC METABOLIC PANEL     Status: Abnormal   Collection Time    05/06/13  4:10 AM      Result Value Range   Sodium 136  135 - 145 mEq/L   Potassium 3.4 (*) 3.5 - 5.1 mEq/L   Chloride 102  96 - 112 mEq/L   CO2 24  19 - 32 mEq/L  Glucose, Bld 164 (*) 70 - 99 mg/dL   BUN 9  6 - 23 mg/dL   Creatinine, Ser 1.61  0.50 - 1.10 mg/dL   Calcium 8.1 (*) 8.4 - 10.5 mg/dL   GFR calc non Af Amer 63 (*) >90 mL/min   GFR calc Af Amer 73 (*) >90 mL/min    Studies/Results: @RISRSLT24 @  Medications: Reviewed   @PROBHOSP @  1.  CAD  S/p PTCA/DES to LCx  Dual platelet inhibitors for at least one year  2.  HL  Lipitor  3.  DM  Hold metformin for 2 more days 4.  Diastolic CHF  Patient given 60 mg Lasix IV yesterday  Looks much better this AM  WIll get  BNP to use as baseline  WIll not keep patient for results  Give 20 KCL 5.  HTN  Follow  No changes for now 6.  Anemia  WIll need to be followed  LOS: 2 days   Dietrich Pates 05/06/2013, 7:26 AM

## 2013-05-07 DIAGNOSIS — H4011X0 Primary open-angle glaucoma, stage unspecified: Secondary | ICD-10-CM | POA: Diagnosis not present

## 2013-05-07 NOTE — Progress Notes (Signed)
Utilization Review Completed.   Raynor Calcaterra, RN, BSN Nurse Case Manager  [phone]  

## 2013-05-08 ENCOUNTER — Telehealth: Payer: Self-pay | Admitting: Cardiology

## 2013-05-08 NOTE — Telephone Encounter (Signed)
Pt was discharged from hospital on Combigan eye drops. Pt saw Dr Nadyne Coombes, [phone]) her eye doctor yesterday. Pt states Dr Mitzi Davenport asked if OK for pt to continue these eye drops since they can have some affect on heart rate. I will forward to Dr [name] for review.

## 2013-05-08 NOTE — Telephone Encounter (Signed)
OK if she has been on them.

## 2013-05-08 NOTE — Telephone Encounter (Signed)
New Prob  Pt needs to speak with you regarding getting some information for her eye doctor.

## 2013-05-09 NOTE — Telephone Encounter (Signed)
Pt.notified

## 2013-05-17 ENCOUNTER — Encounter: Payer: Self-pay | Admitting: Cardiology

## 2013-05-17 ENCOUNTER — Ambulatory Visit (INDEPENDENT_AMBULATORY_CARE_PROVIDER_SITE_OTHER): Payer: Medicare Other | Admitting: Cardiology

## 2013-05-17 VITALS — BP 96/46 | HR 55 | Ht 60.0 in | Wt 192.0 lb

## 2013-05-17 DIAGNOSIS — E7800 Pure hypercholesterolemia, unspecified: Secondary | ICD-10-CM | POA: Diagnosis not present

## 2013-05-17 DIAGNOSIS — R0602 Shortness of breath: Secondary | ICD-10-CM | POA: Diagnosis not present

## 2013-05-17 DIAGNOSIS — I25810 Atherosclerosis of coronary artery bypass graft(s) without angina pectoris: Secondary | ICD-10-CM

## 2013-05-17 DIAGNOSIS — I509 Heart failure, unspecified: Secondary | ICD-10-CM | POA: Diagnosis not present

## 2013-05-17 DIAGNOSIS — I959 Hypotension, unspecified: Secondary | ICD-10-CM

## 2013-05-17 LAB — BASIC METABOLIC PANEL
BUN: 10 mg/dL (ref 6–23)
CO2: 24 mEq/L (ref 19–32)
Chloride: 102 mEq/L (ref 96–112)
Creatinine, Ser: 0.8 mg/dL (ref 0.4–1.2)
Glucose, Bld: 108 mg/dL — ABNORMAL HIGH (ref 70–99)
Potassium: 4.3 mEq/L (ref 3.5–5.1)

## 2013-05-17 MED ORDER — PANTOPRAZOLE SODIUM 40 MG PO TBEC: 40.0000 mg | DELAYED_RELEASE_TABLET | Freq: Every day | ORAL | Status: DC

## 2013-05-17 MED ORDER — AMLODIPINE BESYLATE 5 MG PO TABS: 5.0000 mg | ORAL_TABLET | Freq: Every day | ORAL | Status: DC

## 2013-05-17 NOTE — Patient Instructions (Addendum)
Stop omeprazole.   Start protonix 40mg  daily.  Decrease norvasc (amlodipine) to 5mg  daily. You can take 1/2 of a 10mg  tablet daily and use your current supply.   Your physician recommends that you have  lab work today--BMET/BNP.  Your physician recommends that you schedule a follow-up appointment in: 3 months with Dr [name].   You have been referred to Cardiac Rehab at [facility].

## 2013-05-18 DIAGNOSIS — I959 Hypotension, unspecified: Secondary | ICD-10-CM | POA: Insufficient documentation

## 2013-05-18 NOTE — Progress Notes (Signed)
Patient ID: Christine Gay, female   DOB: 03-09-1941, 72 y.o.   MRN: [id number] PCP: Dr. Nehemiah Settle  72 yo with history of CAD s/p CABG, DM, and HTN presents for cardiology followup.  A couple of months ago, she had her first episode of chest pain in years, while walking across the street.  She also developed significant exertional dyspnea.  Lexiscan Cardiolite was an intermediate risk study and echo showed basal to mid inferior hypokinesis with EF 55-60%.  I did a cardaic catheterization that showed patent LIMA-LAD, occluded SVG-OM1, and 95% ostial LCx stenosis.  She had PCI to ostial LCx with Promus DES.    Since that time, she has been feeling much better.  No chest pain.  She is no longer short of breath with exertion.  She is probably not going to be able to do cardiac rehab due to transportation issues but will try to exercise on her own (walking).  BP is running on the low side today but she denies lightheadedness.  Weight is down 1 lb.   Labs (3/13): K 4.2, creatinine 0.77 Labs (6/14): LDL 80, HDL 69 Labs (8/14): BNP 971 => 420  PMH: 1. GERD 2. Seizure disorder 3. Type II diabetes 4. Chronic LBBB 5. CAD: s/p CABG in 1992, had PCI several years ago.  Echo (2/11) with EF 55%, moderate LVH, aortic sclerosis.  LHC (8/14) with patent LIMA-LAD, totally occluded SVG-OM1, 95% ostial LCx, totally occluded small nondominant RCA with EF 55%.  Patient had Promus DES to pLCx.  6. Glaucoma 7. Hypothyroidism 8. HTN 9. Cholecystectomy 10. Diastolic CHF: Echo (6/14) with EF 55-60%, basal to mid inferior hypokinesis, mild MR and mild AI.  SH: Widow, lives in [place], does not smoke.   FH: No premature CAD.  Diabetes.   ROS: All systems reviewed and negative except as per HPI.   Current Outpatient Prescriptions  Medication Sig Dispense Refill  . aspirin 81 MG tablet Take 81 mg by mouth daily.      Christine Gay Kitchen atorvastatin (LIPITOR) 80 MG tablet Take 80 mg by mouth daily.      . brimonidine-timolol  (COMBIGAN) 0.2-0.5 % ophthalmic solution Place 1 drop into both eyes every 12 (twelve) hours.      . carvedilol (COREG) 6.25 MG tablet Take 1 tablet (6.25 mg total) by mouth 2 (two) times daily with a meal.  180 tablet  0  . clopidogrel (PLAVIX) 75 MG tablet Take 1 tablet (75 mg total) by mouth daily.  30 tablet  11  . furosemide (LASIX) 40 MG tablet Take 1 tablet (40 mg total) by mouth daily.  90 tablet  0  . ibandronate (BONIVA) 150 MG tablet Take 150 mg by mouth every 30 (thirty) days. Take in the morning with a full glass of water, on an empty stomach, and do not take anything else by mouth or lie down for the next 30 min.      . isosorbide mononitrate (IMDUR) 60 MG 24 hr tablet Take 2 tablets (120 mg total) by mouth 2 (two) times daily.  120 tablet  0  . levothyroxine (SYNTHROID, LEVOTHROID) 100 MCG tablet Take 100 mcg by mouth daily before breakfast.      . loteprednol (LOTEMAX) 0.5 % ophthalmic suspension Place 1 drop into both eyes 2 (two) times daily.      . metFORMIN (GLUCOPHAGE-XR) 500 MG 24 hr tablet Take 1 tablet (500 mg total) by mouth 2 (two) times daily.      . nitroGLYCERIN (  NITROSTAT) 0.4 MG SL tablet Place 0.4 mg under the tongue every 5 (five) minutes as needed for chest pain.       . phenytoin (DILANTIN) 100 MG ER capsule Take 100-200 mg by mouth 2 (two) times daily. Takes 2 tablets in am and 1 tablet in pm      . potassium chloride SA (K-DUR,KLOR-CON) 20 MEQ tablet Take 1 tablet (20 mEq total) by mouth daily.  90 tablet  0  . prednisoLONE sodium phosphate (INFLAMASE FORTE) 1 % ophthalmic solution Place 1 drop into both eyes daily. Every other day      . amLODipine (NORVASC) 5 MG tablet Take 1 tablet (5 mg total) by mouth daily.  30 tablet  6  . pantoprazole (PROTONIX) 40 MG tablet Take 1 tablet (40 mg total) by mouth daily.  30 tablet  11   No current facility-administered medications for this visit.    BP 96/46  Pulse 55  Ht 5' (1.524 m)  Wt 87.091 kg (192 lb)  BMI 37.5  kg/m2  SpO2 98% General: NAD Neck: JVP no elevated, no thyromegaly or thyroid nodule.  Lungs: Clear to auscultation bilaterally with normal respiratory effort. CV: Nondisplaced PMI.  Heart regular S1/S2, no S3/S4, 1/6 early SEM.  1+ ankle edema bilaterally.  No carotid bruit.   Abdomen: Soft, nontender, no hepatosplenomegaly, no distention.  Skin: Intact without lesions or rashes.  Neurologic: Alert and oriented x 3.  Psych: Normal affect. Extremities: No clubbing or cyanosis.   Assessment/Plan: 1. CAD: Status post Promus DES to ostial LCx.  No further chest pain, and exertional dyspnea has resolved.  She will continue ASA 81, statin, Coreg, Imdur.  She is on Plavix now, and given her history of both PCI and CABG, I may continue her on this long-term rather than just 1 year.  She needs to stop omeprazole given Plavix use.  She can take Protonix.  2. Diastolic CHF: EF 16-10% on echo.  Some of her dyspnea may have been an anginal equivalent.  She is not volume overloaded now and denies further exertional dyspnea.  She will continue Lasix 40 mg daily for now.  Will get BMET and BNP.  3. Hyperlipidemia: Continue statin with known CAD.  Recent lipids looked ok.  4. Hypotension: BP is low today though she denies lightheadedness.  I will decrease amlodipine to 5 mg daily.   Christine Gay 05/18/2013

## 2013-05-21 ENCOUNTER — Encounter (HOSPITAL_COMMUNITY): Payer: Medicare Other

## 2013-05-23 ENCOUNTER — Encounter (HOSPITAL_COMMUNITY): Payer: Medicare Other

## 2013-05-24 ENCOUNTER — Encounter (HOSPITAL_COMMUNITY)
Admission: RE | Admit: 2013-05-24 | Discharge: 2013-05-24 | Disposition: A | Discharge status: Home / Self Care | Payer: Medicare Other | Source: Ambulatory Visit | Attending: Cardiology | Admitting: Cardiology

## 2013-05-24 NOTE — Progress Notes (Signed)
Cardiac Rehab Medication Review by a Pharmacist  Does the patient  feel that his/her medications are working for him/her?  yes  Has the patient been experiencing any side effects to the medications prescribed?  no  Does the patient measure his/her own blood pressure or blood glucose at home?  yes  (BG and BP though BP machine currently broken and she is going to buy new one)  Does the patient have any problems obtaining medications due to transportation or finances?   no  Understanding of regimen: good Understanding of indications: good Potential of compliance: good    Pharmacist comments: [name] demonstrates good understanding of regimen and describes good compliance.    Christine Gay 05/24/2013 8:44 AM

## 2013-05-25 ENCOUNTER — Encounter (HOSPITAL_COMMUNITY): Payer: Medicare Other

## 2013-05-25 ENCOUNTER — Other Ambulatory Visit: Payer: Self-pay | Admitting: *Deleted

## 2013-05-25 DIAGNOSIS — I25810 Atherosclerosis of coronary artery bypass graft(s) without angina pectoris: Secondary | ICD-10-CM

## 2013-05-25 DIAGNOSIS — I5040 Unspecified combined systolic (congestive) and diastolic (congestive) heart failure: Secondary | ICD-10-CM

## 2013-05-25 DIAGNOSIS — R0600 Dyspnea, unspecified: Secondary | ICD-10-CM

## 2013-05-25 DIAGNOSIS — E7800 Pure hypercholesterolemia, unspecified: Secondary | ICD-10-CM

## 2013-05-25 MED ORDER — ISOSORBIDE MONONITRATE ER 60 MG PO TB24: 120.0000 mg | ORAL_TABLET | Freq: Two times a day (BID) | ORAL | Status: DC

## 2013-05-30 ENCOUNTER — Encounter (HOSPITAL_COMMUNITY)
Admission: RE | Admit: 2013-05-30 | Discharge: 2013-05-30 | Disposition: A | Discharge status: Home / Self Care | Payer: Medicare Other | Source: Ambulatory Visit | Attending: Cardiology | Admitting: Cardiology

## 2013-05-30 DIAGNOSIS — Z5189 Encounter for other specified aftercare: Secondary | ICD-10-CM | POA: Insufficient documentation

## 2013-05-30 DIAGNOSIS — I509 Heart failure, unspecified: Secondary | ICD-10-CM | POA: Diagnosis not present

## 2013-05-30 DIAGNOSIS — Z9861 Coronary angioplasty status: Secondary | ICD-10-CM | POA: Diagnosis not present

## 2013-05-30 DIAGNOSIS — I5032 Chronic diastolic (congestive) heart failure: Secondary | ICD-10-CM | POA: Insufficient documentation

## 2013-05-30 DIAGNOSIS — I2510 Atherosclerotic heart disease of native coronary artery without angina pectoris: Secondary | ICD-10-CM | POA: Diagnosis not present

## 2013-05-30 NOTE — Progress Notes (Signed)
Pt in today for her first day of exercise in cardiac rehab phase II.  Pt attended education class.  Pt pre exercise blood sugar was 79.  Pt given gingerale, peanut butter crackers and banana to eat.  Pt reported that she ate breakfast early that morning around 7:30-8.  Pt arrives early to the exercise class due to using transportation service.  Pt advised that she would not be able to exercise.  Pt instructed to bring a nonperishable snack that she could eat when she arrives around 10:30 for a 11:15 class.  Pt verbalized understanding and has the ability to comply.  Medication list reconciled.  PHQ2 score 0.  Pt is eager to participate in the cardiac rehab program due to past experience in 1992. Pt with positive coping skills which include strong faith base.  Continue to monitor.

## 2013-05-31 DIAGNOSIS — E039 Hypothyroidism, unspecified: Secondary | ICD-10-CM | POA: Diagnosis not present

## 2013-06-01 ENCOUNTER — Telehealth (HOSPITAL_COMMUNITY): Payer: Self-pay | Admitting: Internal Medicine

## 2013-06-01 ENCOUNTER — Encounter (HOSPITAL_COMMUNITY): Payer: Medicare Other

## 2013-06-04 ENCOUNTER — Encounter (HOSPITAL_COMMUNITY)
Admission: RE | Admit: 2013-06-04 | Discharge: 2013-06-04 | Disposition: A | Discharge status: Home / Self Care | Payer: Medicare Other | Source: Ambulatory Visit | Attending: Cardiology | Admitting: Cardiology

## 2013-06-04 LAB — GLUCOSE, CAPILLARY
Glucose-Capillary: 99 mg/dL (ref 70–99)
Glucose-Capillary: 131 mg/dL — ABNORMAL HIGH (ref 70–99)

## 2013-06-04 NOTE — Progress Notes (Signed)
Pt returned today to complete her first day of exercise. Pt absent on Friday due to not feeling well.  Pt tolerated light exercise with some slight complaint of fatigue most notable on the track with ambulation.  Pt given a wheelchair for support and assistance.  Pt with pre exercise blood sugar 99  Pt given a snack to eat during exercise due to eating a light breakfast much earlier in the morning.  Monitor showed Sinus [place]on Foley with BBB.  Reminded pt to have a snack in her purse to eat when she arrives to exercise.  Verbalized understanding.

## 2013-06-06 ENCOUNTER — Telehealth: Payer: Self-pay | Admitting: *Deleted

## 2013-06-06 ENCOUNTER — Encounter (HOSPITAL_COMMUNITY)
Admission: RE | Admit: 2013-06-06 | Discharge: 2013-06-06 | Disposition: A | Discharge status: Home / Self Care | Payer: Medicare Other | Source: Ambulatory Visit | Attending: Cardiology | Admitting: Cardiology

## 2013-06-06 LAB — GLUCOSE, CAPILLARY
Glucose-Capillary: 119 mg/dL — ABNORMAL HIGH (ref 70–99)
Glucose-Capillary: 136 mg/dL — ABNORMAL HIGH (ref 70–99)

## 2013-06-06 NOTE — Telephone Encounter (Signed)
Decrease Coreg to 3.125 mg bid.

## 2013-06-06 NOTE — Telephone Encounter (Signed)
Patient had resting HR of 43 today after Cardiac Rehab session. Awaiting fax providing strips sent for MD review. Strips received and reviewed by Dr. Johney Frame. Advised to call patient and follow up on her symptoms. Spoke to patient at 3:30 pm. She states she is "better now but was feeling tired during the episode" after Cardiac Rehab when they had her sit down and rest prior to leaving the building.  Patient states she is currently resting with her feet propped up and that she feels okay now. Advised her to call back should she have worsening symptoms. Patient states she is appreciative for the followup phone call and that she was given Cardiac Rehab to get stronger. Advised patient that information and strips will be routed to Dr. [name] and his nurse, Katina Dung, RN.

## 2013-06-06 NOTE — Telephone Encounter (Signed)
Christine Gay from Cardiac Rehab called to inform that patient had resting HR of 43 for a few minutes after session today. BP 126/64. Denies dizziness. Takes Coreg. Patient returning home at this time. Byrd Hesselbach will fax over rhythm strips for review by Dr. Johney Frame. Triage nurse will call patient today to check on her once she gets back home.

## 2013-06-06 NOTE — Progress Notes (Addendum)
Heart rate noted at 43 post exercise during cool down, nonsustained sinus Christine Gay.  Patient asymptomatic, Vital signs stable.  Dr Alford Highland office called and notified spoke with [facility]. Will fax exercise flow sheets to Dr. Jenel Lucks office for review.  Dr Johney Frame to review ECG tracing. Patient left cardiac rehab without complaints or symptoms.

## 2013-06-07 MED ORDER — CARVEDILOL 6.25 MG PO TABS: ORAL_TABLET | ORAL | Status: DC

## 2013-06-07 NOTE — Telephone Encounter (Signed)
Called patient. Informed her that Dr. [name] would like for her to decrease her Coreg from 6.25mg  BID to Coreg 3.125mg  BID.  Patient agreeable to medication change but prefers to just break tablet in half instead of getting smaller dosed pills until she uses up her current supply. Rx completed.

## 2013-06-08 ENCOUNTER — Encounter (HOSPITAL_COMMUNITY)
Admission: RE | Admit: 2013-06-08 | Discharge: 2013-06-08 | Disposition: A | Discharge status: Home / Self Care | Payer: Medicare Other | Source: Ambulatory Visit | Attending: Cardiology | Admitting: Cardiology

## 2013-06-08 LAB — GLUCOSE, CAPILLARY
Glucose-Capillary: 135 mg/dL — ABNORMAL HIGH (ref 70–99)
Glucose-Capillary: 127 mg/dL — ABNORMAL HIGH (ref 70–99)

## 2013-06-11 ENCOUNTER — Encounter (HOSPITAL_COMMUNITY)
Admission: RE | Admit: 2013-06-11 | Discharge: 2013-06-11 | Disposition: A | Discharge status: Home / Self Care | Payer: Medicare Other | Source: Ambulatory Visit | Attending: Cardiology | Admitting: Cardiology

## 2013-06-13 ENCOUNTER — Encounter (HOSPITAL_COMMUNITY)
Admission: RE | Admit: 2013-06-13 | Discharge: 2013-06-13 | Disposition: A | Discharge status: Home / Self Care | Payer: Medicare Other | Source: Ambulatory Visit | Attending: Cardiology | Admitting: Cardiology

## 2013-06-15 ENCOUNTER — Encounter (HOSPITAL_COMMUNITY): Payer: Medicare Other

## 2013-06-15 ENCOUNTER — Telehealth (HOSPITAL_COMMUNITY): Payer: Self-pay | Admitting: Internal Medicine

## 2013-06-18 ENCOUNTER — Encounter: Payer: Self-pay | Admitting: Cardiology

## 2013-06-18 ENCOUNTER — Encounter (HOSPITAL_COMMUNITY)
Admission: RE | Admit: 2013-06-18 | Discharge: 2013-06-18 | Disposition: A | Discharge status: Home / Self Care | Payer: Medicare Other | Source: Ambulatory Visit | Attending: Cardiology | Admitting: Cardiology

## 2013-06-18 LAB — GLUCOSE, CAPILLARY: Glucose-Capillary: 127 mg/dL — ABNORMAL HIGH (ref 70–99)

## 2013-06-20 ENCOUNTER — Encounter (HOSPITAL_COMMUNITY)
Admission: RE | Admit: 2013-06-20 | Discharge: 2013-06-20 | Disposition: A | Discharge status: Home / Self Care | Payer: Medicare Other | Source: Ambulatory Visit | Attending: Cardiology | Admitting: Cardiology

## 2013-06-20 LAB — GLUCOSE, CAPILLARY
Glucose-Capillary: 90 mg/dL (ref 70–99)
Glucose-Capillary: 97 mg/dL (ref 70–99)

## 2013-06-20 NOTE — Progress Notes (Signed)
Pt arrived to cardiac rehab early due to transportation service.  Pt attended education class.  Pt pre exercise blood sugar 90.  Pt forgot to bring snack to eat when she arrived to rehab.   Pt given crackers, peanut butter and gingerale.  Unable to exercise today due to protocol for blood sugar to be above 100 prior to exercise.  Pt needed encouragement to eat her snack in a timely fashion.  Pt rechecked 97.  Pt plans to go home and eat lunch.  Pt advised that in the event she does not bring a snack to rehab to please ask the staff for something to eat.  There is always something that can be made available to her.  Pt telephoned rehab of her safe arrival. Blood sugar 94 and she was eating her lunch.  Continue to monitor.

## 2013-06-22 ENCOUNTER — Encounter (HOSPITAL_COMMUNITY)
Admission: RE | Admit: 2013-06-22 | Discharge: 2013-06-22 | Disposition: A | Discharge status: Home / Self Care | Payer: Medicare Other | Source: Ambulatory Visit | Attending: Cardiology | Admitting: Cardiology

## 2013-06-22 LAB — GLUCOSE, CAPILLARY: Glucose-Capillary: 126 mg/dL — ABNORMAL HIGH (ref 70–99)

## 2013-06-22 NOTE — Progress Notes (Signed)
Pt with noted PVC's today with exercise.  Pt asymptomatic, Pt is on Lasix with a KCL replacement.   Will fax strips to Dr. [name] for review.  Pt reported two episodes of "angina"  Relieved with rest.  Will update MD of pt's complaint.  Pt is on Imdur 120mg  twice a day.  Pt reports medicine compliancy.

## 2013-06-25 ENCOUNTER — Encounter (HOSPITAL_COMMUNITY)
Admission: RE | Admit: 2013-06-25 | Discharge: 2013-06-25 | Disposition: A | Discharge status: Home / Self Care | Payer: Medicare Other | Source: Ambulatory Visit | Attending: Cardiology | Admitting: Cardiology

## 2013-06-27 ENCOUNTER — Encounter (HOSPITAL_COMMUNITY)
Admission: RE | Admit: 2013-06-27 | Discharge: 2013-06-27 | Disposition: A | Discharge status: Home / Self Care | Payer: Medicare Other | Source: Ambulatory Visit | Attending: Cardiology | Admitting: Cardiology

## 2013-06-27 DIAGNOSIS — Z9861 Coronary angioplasty status: Secondary | ICD-10-CM | POA: Insufficient documentation

## 2013-06-27 DIAGNOSIS — I5032 Chronic diastolic (congestive) heart failure: Secondary | ICD-10-CM | POA: Insufficient documentation

## 2013-06-27 DIAGNOSIS — I2510 Atherosclerotic heart disease of native coronary artery without angina pectoris: Secondary | ICD-10-CM | POA: Diagnosis not present

## 2013-06-27 DIAGNOSIS — Z5189 Encounter for other specified aftercare: Secondary | ICD-10-CM | POA: Insufficient documentation

## 2013-06-27 DIAGNOSIS — I509 Heart failure, unspecified: Secondary | ICD-10-CM | POA: Diagnosis not present

## 2013-06-27 LAB — GLUCOSE, CAPILLARY: Glucose-Capillary: 90 mg/dL (ref 70–99)

## 2013-06-29 ENCOUNTER — Encounter (HOSPITAL_COMMUNITY): Payer: Medicare Other

## 2013-07-02 ENCOUNTER — Encounter (HOSPITAL_COMMUNITY)
Admission: RE | Admit: 2013-07-02 | Discharge: 2013-07-02 | Disposition: A | Discharge status: Home / Self Care | Payer: Medicare Other | Source: Ambulatory Visit | Attending: Cardiology | Admitting: Cardiology

## 2013-07-02 DIAGNOSIS — I509 Heart failure, unspecified: Secondary | ICD-10-CM | POA: Diagnosis not present

## 2013-07-02 DIAGNOSIS — I5032 Chronic diastolic (congestive) heart failure: Secondary | ICD-10-CM | POA: Diagnosis not present

## 2013-07-02 DIAGNOSIS — Z5189 Encounter for other specified aftercare: Secondary | ICD-10-CM | POA: Diagnosis not present

## 2013-07-02 DIAGNOSIS — Z9861 Coronary angioplasty status: Secondary | ICD-10-CM | POA: Diagnosis not present

## 2013-07-02 DIAGNOSIS — I2510 Atherosclerotic heart disease of native coronary artery without angina pectoris: Secondary | ICD-10-CM | POA: Diagnosis not present

## 2013-07-04 ENCOUNTER — Encounter (HOSPITAL_COMMUNITY)
Admission: RE | Admit: 2013-07-04 | Discharge: 2013-07-04 | Disposition: A | Discharge status: Home / Self Care | Payer: Medicare Other | Source: Ambulatory Visit | Attending: Cardiology | Admitting: Cardiology

## 2013-07-04 DIAGNOSIS — I509 Heart failure, unspecified: Secondary | ICD-10-CM | POA: Diagnosis not present

## 2013-07-04 DIAGNOSIS — Z5189 Encounter for other specified aftercare: Secondary | ICD-10-CM | POA: Diagnosis not present

## 2013-07-04 DIAGNOSIS — I5032 Chronic diastolic (congestive) heart failure: Secondary | ICD-10-CM | POA: Diagnosis not present

## 2013-07-04 DIAGNOSIS — Z9861 Coronary angioplasty status: Secondary | ICD-10-CM | POA: Diagnosis not present

## 2013-07-04 DIAGNOSIS — I2510 Atherosclerotic heart disease of native coronary artery without angina pectoris: Secondary | ICD-10-CM | POA: Diagnosis not present

## 2013-07-04 LAB — GLUCOSE, CAPILLARY
Glucose-Capillary: 98 mg/dL (ref 70–99)
Glucose-Capillary: 99 mg/dL (ref 70–99)

## 2013-07-06 ENCOUNTER — Encounter (HOSPITAL_COMMUNITY)
Admission: RE | Admit: 2013-07-06 | Discharge: 2013-07-06 | Disposition: A | Discharge status: Home / Self Care | Payer: Medicare Other | Source: Ambulatory Visit | Attending: Cardiology | Admitting: Cardiology

## 2013-07-06 DIAGNOSIS — Z5189 Encounter for other specified aftercare: Secondary | ICD-10-CM | POA: Diagnosis not present

## 2013-07-06 DIAGNOSIS — I509 Heart failure, unspecified: Secondary | ICD-10-CM | POA: Diagnosis not present

## 2013-07-06 DIAGNOSIS — I2510 Atherosclerotic heart disease of native coronary artery without angina pectoris: Secondary | ICD-10-CM | POA: Diagnosis not present

## 2013-07-06 DIAGNOSIS — Z9861 Coronary angioplasty status: Secondary | ICD-10-CM | POA: Diagnosis not present

## 2013-07-06 DIAGNOSIS — I5032 Chronic diastolic (congestive) heart failure: Secondary | ICD-10-CM | POA: Diagnosis not present

## 2013-07-06 LAB — GLUCOSE, CAPILLARY: Glucose-Capillary: 114 mg/dL — ABNORMAL HIGH (ref 70–99)

## 2013-07-09 ENCOUNTER — Encounter (HOSPITAL_COMMUNITY): Payer: Medicare Other

## 2013-07-11 ENCOUNTER — Encounter (HOSPITAL_COMMUNITY)
Admission: RE | Admit: 2013-07-11 | Discharge: 2013-07-11 | Disposition: A | Discharge status: Home / Self Care | Payer: Medicare Other | Source: Ambulatory Visit | Attending: Cardiology | Admitting: Cardiology

## 2013-07-11 DIAGNOSIS — Z5189 Encounter for other specified aftercare: Secondary | ICD-10-CM | POA: Diagnosis not present

## 2013-07-11 DIAGNOSIS — I5032 Chronic diastolic (congestive) heart failure: Secondary | ICD-10-CM | POA: Diagnosis not present

## 2013-07-11 DIAGNOSIS — Z9861 Coronary angioplasty status: Secondary | ICD-10-CM | POA: Diagnosis not present

## 2013-07-11 DIAGNOSIS — I509 Heart failure, unspecified: Secondary | ICD-10-CM | POA: Diagnosis not present

## 2013-07-11 DIAGNOSIS — I2510 Atherosclerotic heart disease of native coronary artery without angina pectoris: Secondary | ICD-10-CM | POA: Diagnosis not present

## 2013-07-11 LAB — GLUCOSE, CAPILLARY: Glucose-Capillary: 114 mg/dL — ABNORMAL HIGH (ref 70–99)

## 2013-07-12 ENCOUNTER — Encounter: Payer: Self-pay | Admitting: Cardiology

## 2013-07-13 ENCOUNTER — Telehealth (HOSPITAL_COMMUNITY): Payer: Self-pay | Admitting: Internal Medicine

## 2013-07-13 ENCOUNTER — Encounter (HOSPITAL_COMMUNITY): Payer: Medicare Other

## 2013-07-16 ENCOUNTER — Encounter (HOSPITAL_COMMUNITY): Payer: Medicare Other

## 2013-07-18 ENCOUNTER — Encounter (HOSPITAL_COMMUNITY): Admission: RE | Admit: 2013-07-18 | Payer: Medicare Other | Source: Ambulatory Visit

## 2013-07-18 NOTE — Progress Notes (Signed)
Christine Gay 72 y.o. female Nutrition Note Spoke with pt.  Nutrition Plan and Nutrition Survey goals reviewed with pt. Pt is following Step 2 of the Therapeutic Lifestyle Changes diet. Dietary changes reportedly strongly encouraged by and reinforced by pt's children. Pt states she lives independently and her daughter prepares most of the meals and does all of the grocery shopping "because she's trying to help me follow the diet." Pt avoids pork, shellfish, and caffeine due to her "[employer]" faith. Pt wants to lose wt. Pt has been trying to lose wt by decreasing portion sizes. Pt wt today down 2 kg over the past month. Wt loss tips reviewed.  Pt is diabetic. No recent A1c available to assess. This Clinical research associate went over Diabetes Education test results. Pt expressed understanding of the information reviewed. Pt aware of nutrition education classes offered.  Nutrition Diagnosis   Food-and nutrition-related knowledge deficit related to lack of exposure to information as related to diagnosis of: ? CVD ? DM   Nutrition RX/ Estimated Daily Nutrition Needs for: wt loss  1300-1500 Kcal, 35-40 gm fat, 9-12 gm sat fat, 1.2-1.5 gm trans-fat, <1500 mg sodium, 150-175 gm CHO   Nutrition Intervention   Pt's individual nutrition plan reviewed with pt.   Benefits of adopting Therapeutic Lifestyle Changes discussed when Medficts reviewed.   Pt to attend the Portion Distortion class   Pt to attend the  ? Nutrition I class                     ? Nutrition II class        ? Diabetes Blitz class       ? Diabetes Q & A class - met 07/06/13   Continue client-centered nutrition education by RD, as part of interdisciplinary care. Goal(s)   Pt to identify food quantities necessary to achieve: ? wt loss to a goal wt of 170-188 lb (77.2-85.4 kg) at graduation from cardiac rehab.  Monitor and Evaluate progress toward nutrition goal with team. Nutrition Risk: Change to Moderate  Mickle Plumb, M.Ed, RD, LDN, CDE 07/19/2013  11:34 AM

## 2013-07-20 ENCOUNTER — Encounter (HOSPITAL_COMMUNITY)
Admission: RE | Admit: 2013-07-20 | Discharge: 2013-07-20 | Disposition: A | Discharge status: Home / Self Care | Payer: Medicare Other | Source: Ambulatory Visit | Attending: Cardiology | Admitting: Cardiology

## 2013-07-20 DIAGNOSIS — I509 Heart failure, unspecified: Secondary | ICD-10-CM | POA: Diagnosis not present

## 2013-07-20 DIAGNOSIS — I5032 Chronic diastolic (congestive) heart failure: Secondary | ICD-10-CM | POA: Diagnosis not present

## 2013-07-20 DIAGNOSIS — Z5189 Encounter for other specified aftercare: Secondary | ICD-10-CM | POA: Diagnosis not present

## 2013-07-20 DIAGNOSIS — Z9861 Coronary angioplasty status: Secondary | ICD-10-CM | POA: Diagnosis not present

## 2013-07-20 DIAGNOSIS — I2510 Atherosclerotic heart disease of native coronary artery without angina pectoris: Secondary | ICD-10-CM | POA: Diagnosis not present

## 2013-07-20 LAB — GLUCOSE, CAPILLARY: Glucose-Capillary: 133 mg/dL — ABNORMAL HIGH (ref 70–99)

## 2013-07-23 ENCOUNTER — Encounter (HOSPITAL_COMMUNITY): Payer: Medicare Other

## 2013-07-25 ENCOUNTER — Encounter (HOSPITAL_COMMUNITY)
Admission: RE | Admit: 2013-07-25 | Discharge: 2013-07-25 | Disposition: A | Discharge status: Home / Self Care | Payer: Medicare Other | Source: Ambulatory Visit | Attending: Cardiology | Admitting: Cardiology

## 2013-07-25 DIAGNOSIS — I5032 Chronic diastolic (congestive) heart failure: Secondary | ICD-10-CM | POA: Diagnosis not present

## 2013-07-25 DIAGNOSIS — Z9861 Coronary angioplasty status: Secondary | ICD-10-CM | POA: Diagnosis not present

## 2013-07-25 DIAGNOSIS — Z5189 Encounter for other specified aftercare: Secondary | ICD-10-CM | POA: Diagnosis not present

## 2013-07-25 DIAGNOSIS — I509 Heart failure, unspecified: Secondary | ICD-10-CM | POA: Diagnosis not present

## 2013-07-25 DIAGNOSIS — I2510 Atherosclerotic heart disease of native coronary artery without angina pectoris: Secondary | ICD-10-CM | POA: Diagnosis not present

## 2013-07-25 LAB — GLUCOSE, CAPILLARY: Glucose-Capillary: 103 mg/dL — ABNORMAL HIGH (ref 70–99)

## 2013-07-27 ENCOUNTER — Encounter (HOSPITAL_COMMUNITY)
Admission: RE | Admit: 2013-07-27 | Discharge: 2013-07-27 | Disposition: A | Discharge status: Home / Self Care | Payer: Medicare Other | Source: Ambulatory Visit | Attending: Cardiology | Admitting: Cardiology

## 2013-07-27 DIAGNOSIS — Z9861 Coronary angioplasty status: Secondary | ICD-10-CM | POA: Diagnosis not present

## 2013-07-27 DIAGNOSIS — I509 Heart failure, unspecified: Secondary | ICD-10-CM | POA: Diagnosis not present

## 2013-07-27 DIAGNOSIS — Z5189 Encounter for other specified aftercare: Secondary | ICD-10-CM | POA: Diagnosis not present

## 2013-07-27 DIAGNOSIS — I2510 Atherosclerotic heart disease of native coronary artery without angina pectoris: Secondary | ICD-10-CM | POA: Diagnosis not present

## 2013-07-27 DIAGNOSIS — I5032 Chronic diastolic (congestive) heart failure: Secondary | ICD-10-CM | POA: Diagnosis not present

## 2013-07-27 NOTE — Progress Notes (Signed)
Pt here for 11:15 exercise class. During the first station.  Pt was observed by rehab volunteers taking NTG.  Volunteers Occupational hygienist.  Pt c/o cp rated 4/10.  Pt rates this as being more intense than previous  Episodes of angina pt experiences.  Pt assisted of the stepper to a chair.  O2 applied at 4Lnc. BP 120/60.  Discomfort resolved immediately. Pt did not have any reoccurrence of discomfort and verifies medication involvement.  Pt rested the remaining time symptom free.  Pt blood sugar rechecked 150 post snack given snack for pre exercise blood glucose 92.  Repeat bp 114/60.  Pt will see Dr. [name] on 11/13 as a scheduled appt.  Pt is in agreement due to the nature of her CAD and understands to notify 911 for any recurrent cp not resolved with NTG s;  Will send in basket to Dr. [name]

## 2013-07-30 ENCOUNTER — Encounter (HOSPITAL_COMMUNITY): Payer: Medicare Other

## 2013-07-31 ENCOUNTER — Other Ambulatory Visit: Payer: Self-pay

## 2013-07-31 ENCOUNTER — Other Ambulatory Visit: Payer: Self-pay | Admitting: Cardiology

## 2013-07-31 DIAGNOSIS — E039 Hypothyroidism, unspecified: Secondary | ICD-10-CM | POA: Diagnosis not present

## 2013-07-31 DIAGNOSIS — I25810 Atherosclerosis of coronary artery bypass graft(s) without angina pectoris: Secondary | ICD-10-CM

## 2013-07-31 DIAGNOSIS — I10 Essential (primary) hypertension: Secondary | ICD-10-CM | POA: Diagnosis not present

## 2013-07-31 DIAGNOSIS — E7800 Pure hypercholesterolemia, unspecified: Secondary | ICD-10-CM

## 2013-07-31 DIAGNOSIS — E1129 Type 2 diabetes mellitus with other diabetic kidney complication: Secondary | ICD-10-CM | POA: Diagnosis not present

## 2013-07-31 DIAGNOSIS — N182 Chronic kidney disease, stage 2 (mild): Secondary | ICD-10-CM | POA: Diagnosis not present

## 2013-07-31 DIAGNOSIS — Z23 Encounter for immunization: Secondary | ICD-10-CM | POA: Diagnosis not present

## 2013-07-31 DIAGNOSIS — R0600 Dyspnea, unspecified: Secondary | ICD-10-CM

## 2013-07-31 DIAGNOSIS — I5040 Unspecified combined systolic (congestive) and diastolic (congestive) heart failure: Secondary | ICD-10-CM

## 2013-07-31 DIAGNOSIS — E782 Mixed hyperlipidemia: Secondary | ICD-10-CM | POA: Diagnosis not present

## 2013-07-31 MED ORDER — FUROSEMIDE 40 MG PO TABS: 40.0000 mg | ORAL_TABLET | Freq: Every day | ORAL | Status: DC

## 2013-08-01 ENCOUNTER — Encounter (HOSPITAL_COMMUNITY)
Admission: RE | Admit: 2013-08-01 | Discharge: 2013-08-01 | Disposition: A | Discharge status: Home / Self Care | Payer: Medicare Other | Source: Ambulatory Visit | Attending: Cardiology | Admitting: Cardiology

## 2013-08-01 DIAGNOSIS — I5032 Chronic diastolic (congestive) heart failure: Secondary | ICD-10-CM | POA: Insufficient documentation

## 2013-08-01 DIAGNOSIS — I2510 Atherosclerotic heart disease of native coronary artery without angina pectoris: Secondary | ICD-10-CM | POA: Insufficient documentation

## 2013-08-01 DIAGNOSIS — Z9861 Coronary angioplasty status: Secondary | ICD-10-CM | POA: Diagnosis not present

## 2013-08-01 DIAGNOSIS — I509 Heart failure, unspecified: Secondary | ICD-10-CM | POA: Diagnosis not present

## 2013-08-01 DIAGNOSIS — Z5189 Encounter for other specified aftercare: Secondary | ICD-10-CM | POA: Diagnosis not present

## 2013-08-03 ENCOUNTER — Encounter (HOSPITAL_COMMUNITY)
Admission: RE | Admit: 2013-08-03 | Discharge: 2013-08-03 | Disposition: A | Discharge status: Home / Self Care | Payer: Medicare Other | Source: Ambulatory Visit | Attending: Cardiology | Admitting: Cardiology

## 2013-08-03 NOTE — Progress Notes (Signed)
Pt .completed 17 exercise sessions during her participation in cardiac rehab phase II.  Pt elected to discontinue early due to upcoming trip to  Extended visit with her son in [place].  Pt started with 3 x week participation but that became difficult due to fatigue and transportation issues.  Pt consistently attended 2 x a week.  Pt plans to continue home exercise with walking.  Pt would benefit from continuing in a group program due to her social nature and tendencies.  Pt made significant improvement with physical ability and felt stronger than before she started exercise.  I anticipate that she will do well due to her positive outlook on life.  Repeat PHQ2 score 0.  Positive coping skills demonstrated.

## 2013-08-06 ENCOUNTER — Encounter (HOSPITAL_COMMUNITY): Payer: Medicare Other

## 2013-08-07 ENCOUNTER — Ambulatory Visit: Payer: Self-pay

## 2013-08-07 ENCOUNTER — Telehealth: Payer: Self-pay | Admitting: *Deleted

## 2013-08-07 NOTE — Telephone Encounter (Signed)
SPOKE TO PATIENT EARLIER AND CXL'D APPT

## 2013-08-07 NOTE — Telephone Encounter (Signed)
Pt called states [name] didn't pick her up today, because they were closed, but just told her today.  Please call me and reschedule for this week.  Referred to scheduler.

## 2013-08-08 ENCOUNTER — Encounter (HOSPITAL_COMMUNITY): Payer: Medicare Other

## 2013-08-08 DIAGNOSIS — H4011X0 Primary open-angle glaucoma, stage unspecified: Secondary | ICD-10-CM | POA: Diagnosis not present

## 2013-08-09 ENCOUNTER — Encounter: Payer: Self-pay | Admitting: Cardiology

## 2013-08-09 ENCOUNTER — Ambulatory Visit (INDEPENDENT_AMBULATORY_CARE_PROVIDER_SITE_OTHER): Payer: Medicare Other | Admitting: Cardiology

## 2013-08-09 VITALS — BP 112/60 | HR 50 | Ht 61.5 in | Wt 189.0 lb

## 2013-08-09 DIAGNOSIS — R0602 Shortness of breath: Secondary | ICD-10-CM

## 2013-08-09 DIAGNOSIS — I509 Heart failure, unspecified: Secondary | ICD-10-CM

## 2013-08-09 DIAGNOSIS — I5032 Chronic diastolic (congestive) heart failure: Secondary | ICD-10-CM

## 2013-08-09 DIAGNOSIS — E7800 Pure hypercholesterolemia, unspecified: Secondary | ICD-10-CM

## 2013-08-09 DIAGNOSIS — I25810 Atherosclerosis of coronary artery bypass graft(s) without angina pectoris: Secondary | ICD-10-CM

## 2013-08-09 MED ORDER — FUROSEMIDE 40 MG PO TABS: ORAL_TABLET | ORAL | Status: DC

## 2013-08-09 MED ORDER — POTASSIUM CHLORIDE CRYS ER 20 MEQ PO TBCR: EXTENDED_RELEASE_TABLET | ORAL | Status: DC

## 2013-08-09 NOTE — Patient Instructions (Signed)
Increase lasix (furosmide) to 40mg  in the AM and 20mg  in the PM. This will be 1 of your 40mg  tablets in the AM and 1/2 of your 40mg  tablets in the PM.  Increase KCL (potassium) to 20 mEq in the AM and 10 mEq in the PM. This will be 1 of your 20 mEq tablets in the AM and 1/2 of your 20 mEq tablets in the PM.  Your physician recommends that you schedule lab work for  Monday November 17,2014--BMET/BNP  Your physician recommends that you schedule a follow-up appointment in: 3 months with Dr [name].

## 2013-08-09 NOTE — Progress Notes (Signed)
Patient ID: Christine Gay, female   DOB: 1941/06/02, 72 y.o.   MRN: [id number] PCP: Dr. Nehemiah Settle  72 yo with history of CAD s/p CABG, DM, and HTN presents for cardiology followup.  Earlier this year, she developed exertional chest pain.  She also developed significant exertional dyspnea.  Lexiscan Cardiolite was an intermediate risk study and echo showed basal to mid inferior hypokinesis with EF 55-60%.  I did a cardaic catheterization that showed patent LIMA-LAD, occluded SVG-OM1, and 95% ostial LCx stenosis.  She had PCI to ostial LCx with Promus DES.    Since that time, she has been feeling better.  Rare chest pain, only with heavy lifting.  She does not get short of breath walking on flat ground and can climb 1 flight of steps with mild dyspnea.  No orthopnea/PND.  She completed cardiac rehab. Weight is down 3 lbs since last visit.  She is going to go to see her son in [place] for several weeks.   Labs (3/13): K 4.2, creatinine 0.77 Labs (6/14): LDL 80, HDL 69 Labs (8/14): BNP 971 => 420 => 138, creatinine 0.8, K 4.3  PMH: 1. GERD 2. Seizure disorder 3. Type II diabetes 4. Chronic LBBB 5. CAD: s/p CABG in 1992, had PCI several years ago.  Echo (2/11) with EF 55%, moderate LVH, aortic sclerosis.  LHC (8/14) with patent LIMA-LAD, totally occluded SVG-OM1, 95% ostial LCx, totally occluded small nondominant RCA with EF 55%.  Patient had Promus DES to pLCx.  6. Glaucoma 7. Hypothyroidism 8. HTN 9. Cholecystectomy 10. Diastolic CHF: Echo (6/14) with EF 55-60%, basal to mid inferior hypokinesis, mild MR and mild AI.  SH: Widow, lives in [place], does not smoke.   FH: No premature CAD.  Diabetes.   ROS: All systems reviewed and negative except as per HPI.   Current Outpatient Prescriptions  Medication Sig Dispense Refill  . amLODipine (NORVASC) 5 MG tablet Take 1 tablet (5 mg total) by mouth daily.  30 tablet  6  . aspirin 81 MG tablet Take 81 mg by mouth daily.      Marland Kitchen atorvastatin  (LIPITOR) 80 MG tablet Take 80 mg by mouth daily.      . brimonidine-timolol (COMBIGAN) 0.2-0.5 % ophthalmic solution Place 1 drop into both eyes daily.       . carvedilol (COREG) 6.25 MG tablet Take 1/2 tablet (3.125 mg total) by mouth 2 (two) times daily with a meal.  90 tablet  3  . clopidogrel (PLAVIX) 75 MG tablet Take 1 tablet (75 mg total) by mouth daily.  30 tablet  11  . ibandronate (BONIVA) 150 MG tablet Take 150 mg by mouth every 30 (thirty) days. Take in the morning with a full glass of water, on an empty stomach, and do not take anything else by mouth or lie down for the next 30 min.      . isosorbide mononitrate (IMDUR) 60 MG 24 hr tablet TAKE 2 TABLETS (120 MG TOTAL) BY MOUTH 2 (TWO) TIMES DAILY.  360 tablet  0  . levothyroxine (SYNTHROID, LEVOTHROID) 100 MCG tablet Take 100 mcg by mouth daily before breakfast.      . loteprednol (LOTEMAX) 0.5 % ophthalmic suspension Place 1 drop into both eyes daily.       . metFORMIN (GLUCOPHAGE-XR) 500 MG 24 hr tablet Take 1 tablet (500 mg total) by mouth 2 (two) times daily.      . nitroGLYCERIN (NITROSTAT) 0.4 MG SL tablet Place 0.4 mg under  the tongue every 5 (five) minutes as needed for chest pain.       . pantoprazole (PROTONIX) 40 MG tablet Take 1 tablet (40 mg total) by mouth daily.  30 tablet  11  . phenytoin (DILANTIN) 100 MG ER capsule Take 100-200 mg by mouth 2 (two) times daily. Takes 2 tablets in am and 1 tablet in pm      . prednisoLONE sodium phosphate (INFLAMASE FORTE) 1 % ophthalmic solution Place 1 drop into both eyes every other day. Every other day      . furosemide (LASIX) 40 MG tablet 1 tablet (total 40mg ) in the AM and 1/2 tablet (total 20mg ) in the PM  50 tablet  3  . potassium chloride SA (K-DUR,KLOR-CON) 20 MEQ tablet 1 tablet (total 20 mEq) in the AM and 1/2 tablet (total 10 mEq)  50 tablet  3   No current facility-administered medications for this visit.    BP 112/60  Pulse 50  Ht 5' 1.5" (1.562 m)  Wt 189 lb (85.73  kg)  BMI 35.14 kg/m2  SpO2 98% General: NAD Neck: JVP 8-9 cm, no thyromegaly or thyroid nodule.  Lungs: Clear to auscultation bilaterally with normal respiratory effort. CV: Nondisplaced PMI.  Heart regular S1/S2, no S3/S4, 2/6 early SEM.  1+ edema 1/2 up lower legs bilaterally.  No carotid bruit.   Abdomen: Soft, nontender, no hepatosplenomegaly, no distention.  Skin: Intact without lesions or rashes.  Neurologic: Alert and oriented x 3.  Psych: Normal affect. Extremities: No clubbing or cyanosis.   Assessment/Plan: 1. CAD: Status post Promus DES to ostial LCx.  Rare chest pain, and exertional dyspnea has improved.  She will continue ASA 81, statin, Coreg, Imdur.  She is on Plavix now, and given her history of both PCI and CABG, I may continue her on this long-term rather than just 1 year.   2. Diastolic CHF: EF 16-10% on echo.  She does have some volume overload on exam.  NYHA class II-III symptoms.  I will increase her Lasix to 40 qam, 20 qpm.  I will increase her KCl to 20 qam, 10 qpm.  She will get a BMET/BNP next week.  3. Hyperlipidemia: Continue statin with known CAD.  Recent lipids looked ok.   [name] 08/09/2013

## 2013-08-10 ENCOUNTER — Encounter: Payer: Self-pay | Admitting: Cardiology

## 2013-08-10 ENCOUNTER — Encounter (HOSPITAL_COMMUNITY): Payer: Medicare Other

## 2013-08-13 ENCOUNTER — Encounter (HOSPITAL_COMMUNITY): Payer: Medicare Other

## 2013-08-13 ENCOUNTER — Other Ambulatory Visit (INDEPENDENT_AMBULATORY_CARE_PROVIDER_SITE_OTHER): Payer: Medicare Other

## 2013-08-13 DIAGNOSIS — I509 Heart failure, unspecified: Secondary | ICD-10-CM

## 2013-08-13 DIAGNOSIS — R0602 Shortness of breath: Secondary | ICD-10-CM

## 2013-08-13 LAB — BASIC METABOLIC PANEL
CO2: 29 mEq/L (ref 19–32)
Calcium: 9 mg/dL (ref 8.4–10.5)
Glucose, Bld: 134 mg/dL — ABNORMAL HIGH (ref 70–99)
Potassium: 4.1 mEq/L (ref 3.5–5.1)
Sodium: 137 mEq/L (ref 135–145)

## 2013-08-15 ENCOUNTER — Encounter (HOSPITAL_COMMUNITY): Payer: Medicare Other

## 2013-08-17 ENCOUNTER — Encounter (HOSPITAL_COMMUNITY): Payer: Medicare Other

## 2013-08-20 ENCOUNTER — Encounter (HOSPITAL_COMMUNITY): Payer: Medicare Other

## 2013-08-22 ENCOUNTER — Encounter (HOSPITAL_COMMUNITY): Payer: Medicare Other

## 2013-08-22 ENCOUNTER — Encounter: Payer: Self-pay | Admitting: Cardiology

## 2013-11-26 ENCOUNTER — Other Ambulatory Visit: Payer: Self-pay | Admitting: Cardiology

## 2013-11-27 DIAGNOSIS — E782 Mixed hyperlipidemia: Secondary | ICD-10-CM | POA: Diagnosis not present

## 2013-11-27 DIAGNOSIS — I10 Essential (primary) hypertension: Secondary | ICD-10-CM | POA: Diagnosis not present

## 2013-11-27 DIAGNOSIS — E119 Type 2 diabetes mellitus without complications: Secondary | ICD-10-CM | POA: Diagnosis not present

## 2013-11-30 ENCOUNTER — Other Ambulatory Visit: Payer: Self-pay | Admitting: *Deleted

## 2013-11-30 DIAGNOSIS — I509 Heart failure, unspecified: Secondary | ICD-10-CM

## 2013-11-30 DIAGNOSIS — R0602 Shortness of breath: Secondary | ICD-10-CM

## 2013-11-30 MED ORDER — FUROSEMIDE 40 MG PO TABS: ORAL_TABLET | ORAL | Status: DC

## 2013-12-06 ENCOUNTER — Encounter: Payer: Self-pay | Admitting: *Deleted

## 2013-12-07 ENCOUNTER — Encounter: Payer: Self-pay | Admitting: Cardiology

## 2013-12-07 ENCOUNTER — Ambulatory Visit: Payer: Medicare Other | Admitting: Cardiology

## 2013-12-07 ENCOUNTER — Ambulatory Visit (INDEPENDENT_AMBULATORY_CARE_PROVIDER_SITE_OTHER): Payer: Medicare Other | Admitting: Cardiology

## 2013-12-07 VITALS — BP 132/60 | HR 53 | Ht 61.5 in | Wt 170.0 lb

## 2013-12-07 DIAGNOSIS — I25810 Atherosclerosis of coronary artery bypass graft(s) without angina pectoris: Secondary | ICD-10-CM | POA: Diagnosis not present

## 2013-12-07 DIAGNOSIS — I509 Heart failure, unspecified: Secondary | ICD-10-CM | POA: Diagnosis not present

## 2013-12-07 DIAGNOSIS — I5032 Chronic diastolic (congestive) heart failure: Secondary | ICD-10-CM | POA: Diagnosis not present

## 2013-12-07 DIAGNOSIS — E7800 Pure hypercholesterolemia, unspecified: Secondary | ICD-10-CM

## 2013-12-07 NOTE — Patient Instructions (Signed)
Your physician recommends that you continue on your current medications as directed. Please refer to the Current Medication list given to you today.  Your physician wants you to follow-up in: 4 months with Dr. Aundra Dubin. You will receive a reminder letter in the mail two months in advance. If you don't receive a letter, please call our office to schedule the follow-up appointment.

## 2013-12-09 NOTE — Progress Notes (Signed)
Patient ID: Christine Gay, female   DOB: [birth year], 73 y.o.   MRN: [id number] PCP: Dr. Delfina Redwood  73 yo with history of CAD s/p CABG, DM, and HTN presents for cardiology followup.  She had a  Lexiscan Cardiolite last year that was an intermediate risk study and echo showed basal to mid inferior hypokinesis with EF 55-60%.  I did a cardaic catheterization in 8/14 that showed patent LIMA-LAD, occluded SVG-OM1, and 95% ostial LCx stenosis.  She had PCI to ostial LCx with Promus DES.    Since that time, she has been feeling better.  Rare chest pain, only with heavy lifting.  She does not get short of breath walking on flat ground and can climb 1 flight of steps with mild dyspnea.  No orthopnea/PND.  She has been losing weight with diet and increased exercise.  She is down another 19 lbs.  She walks with a cane for stability.  No lightheadedness.   Labs (3/13): K 4.2, creatinine 0.77 Labs (6/14): LDL 80, HDL 69 Labs (8/14): BNP 971 => 420 => 138, creatinine 0.8, K 4.3 Labs (11/14): K 4.1, creatinine 0.8, BNP 88 Labs (3/15): K 3.4, creatinine 0.86, LDL 85  ECG: NSR with small Ps versus ectopic atrial rhythm at 53 with LBBB   PMH: 1. GERD 2. Seizure disorder 3. Type II diabetes 4. Chronic LBBB 5. CAD: s/p CABG in 1992, had PCI several years ago.  Echo (2/11) with EF 55%, moderate LVH, aortic sclerosis.  LHC (8/14) with patent LIMA-LAD, totally occluded SVG-OM1, 95% ostial LCx, totally occluded small nondominant RCA with EF 55%.  Patient had Promus DES to pLCx.  6. Glaucoma 7. Hypothyroidism 8. HTN 9. Cholecystectomy 10. Diastolic CHF: Echo (4/69) with EF 55-60%, basal to mid inferior hypokinesis, mild MR and mild AI.  SH: Widow, lives in [place], does not smoke.   FH: No premature CAD.  Diabetes.   ROS: All systems reviewed and negative except as per HPI.   Current Outpatient Prescriptions  Medication Sig Dispense Refill  . amLODipine (NORVASC) 5 MG tablet Take 1 tablet (5 mg total) by  mouth daily.  30 tablet  6  . aspirin 81 MG tablet Take 81 mg by mouth daily.      Marland Kitchen atorvastatin (LIPITOR) 80 MG tablet Take 80 mg by mouth daily.      . brimonidine-timolol (COMBIGAN) 0.2-0.5 % ophthalmic solution Place 1 drop into both eyes daily.       . carvedilol (COREG) 6.25 MG tablet Take 1/2 tablet (3.125 mg total) by mouth 2 (two) times daily with a meal.  90 tablet  3  . clopidogrel (PLAVIX) 75 MG tablet Take 1 tablet (75 mg total) by mouth daily.  30 tablet  11  . furosemide (LASIX) 40 MG tablet 1 tablet (total 40mg ) in the AM and 1/2 tablet (total 20mg ) in the PM  50 tablet  1  . ibandronate (BONIVA) 150 MG tablet Take 150 mg by mouth every 30 (thirty) days. Take in the morning with a full glass of water, on an empty stomach, and do not take anything else by mouth or lie down for the next 30 min.      . isosorbide mononitrate (IMDUR) 60 MG 24 hr tablet TAKE 2 TABLETS BY MOUTH 2 TIMES DAILY.  360 tablet  0  . levothyroxine (SYNTHROID, LEVOTHROID) 100 MCG tablet Take 100 mcg by mouth daily before breakfast.      . loteprednol (LOTEMAX) 0.5 % ophthalmic suspension Place  1 drop into both eyes daily.       . metFORMIN (GLUCOPHAGE-XR) 500 MG 24 hr tablet Take 1 tablet (500 mg total) by mouth 2 (two) times daily.      . nitroGLYCERIN (NITROSTAT) 0.4 MG SL tablet Place 0.4 mg under the tongue every 5 (five) minutes as needed for chest pain.       . pantoprazole (PROTONIX) 40 MG tablet Take 1 tablet (40 mg total) by mouth daily.  30 tablet  11  . phenytoin (DILANTIN) 100 MG ER capsule Take 100-200 mg by mouth 2 (two) times daily. Takes 2 tablets in am and 1 tablet in pm      . potassium chloride SA (K-DUR,KLOR-CON) 20 MEQ tablet 1 tablet (total 20 mEq) in the AM and 1/2 tablet (total 10 mEq)  50 tablet  3  . prednisoLONE sodium phosphate (INFLAMASE FORTE) 1 % ophthalmic solution Place 1 drop into both eyes every other day. Every other day       No current facility-administered medications for  this visit.    BP 132/60  Pulse 53  Ht 5' 1.5" (1.562 m)  Wt 77.111 kg (170 lb)  BMI 31.60 kg/m2 General: NAD Neck: JVP 7-8 cm, no thyromegaly or thyroid nodule.  Lungs: Clear to auscultation bilaterally with normal respiratory effort. CV: Nondisplaced PMI.  Heart regular S1/S2, no S3/S4, 2/6 early SEM.  Trace ankle edema bilaterally.  No carotid bruit.   Abdomen: Soft, nontender, no hepatosplenomegaly, no distention.  Skin: Intact without lesions or rashes.  Neurologic: Alert and oriented x 3.  Psych: Normal affect. Extremities: No clubbing or cyanosis.   Assessment/Plan: 1. CAD: Status post Promus DES to ostial LCx in 8/14.  Rare chest pain, and exertional dyspnea has improved.  She will continue ASA 81, statin, Coreg, Imdur.  She is on Plavix now, and given her history of both PCI with DES and CABG, I will continue her on this long-term.   2. Diastolic CHF: EF 12-75% on echo.  Volume stable with NYHA class II symptoms.  Continue current Lasix.  3. Hyperlipidemia: Continue statin with known CAD.  Recent lipids looked ok.  4. ECG: Patient has either sinus rhythm with low voltage, diseased-appearing P waves or an ectopic atrial rhythm.  HR is in the 50s.  No lightheadedness.  She is only on a low dose of Coreg, which I think she can continue for now.   [name] 12/09/2013

## 2013-12-11 DIAGNOSIS — H4011X0 Primary open-angle glaucoma, stage unspecified: Secondary | ICD-10-CM | POA: Diagnosis not present

## 2013-12-11 DIAGNOSIS — E119 Type 2 diabetes mellitus without complications: Secondary | ICD-10-CM | POA: Diagnosis not present

## 2013-12-11 DIAGNOSIS — Z961 Presence of intraocular lens: Secondary | ICD-10-CM | POA: Diagnosis not present

## 2013-12-25 ENCOUNTER — Ambulatory Visit: Payer: Self-pay

## 2013-12-25 ENCOUNTER — Ambulatory Visit (INDEPENDENT_AMBULATORY_CARE_PROVIDER_SITE_OTHER): Payer: Medicare Other

## 2013-12-25 VITALS — BP 146/74 | HR 86 | Resp 12

## 2013-12-25 DIAGNOSIS — Q828 Other specified congenital malformations of skin: Secondary | ICD-10-CM

## 2013-12-25 DIAGNOSIS — E1140 Type 2 diabetes mellitus with diabetic neuropathy, unspecified: Secondary | ICD-10-CM

## 2013-12-25 DIAGNOSIS — E1149 Type 2 diabetes mellitus with other diabetic neurological complication: Secondary | ICD-10-CM

## 2013-12-25 DIAGNOSIS — L608 Other nail disorders: Secondary | ICD-10-CM | POA: Diagnosis not present

## 2013-12-25 DIAGNOSIS — E1142 Type 2 diabetes mellitus with diabetic polyneuropathy: Secondary | ICD-10-CM

## 2013-12-25 NOTE — Patient Instructions (Signed)
Diabetes and Foot Care Diabetes may cause you to have problems because of poor blood supply (circulation) to your feet and legs. This may cause the skin on your feet to become thinner, break easier, and heal more slowly. Your skin may become dry, and the skin may peel and crack. You may also have nerve damage in your legs and feet causing decreased feeling in them. You may not notice minor injuries to your feet that could lead to infections or more serious problems. Taking care of your feet is one of the most important things you can do for yourself.  HOME CARE INSTRUCTIONS  Wear shoes at all times, even in the house. Do not go barefoot. Bare feet are easily injured.  Check your feet daily for blisters, cuts, and redness. If you cannot see the bottom of your feet, use a mirror or ask someone for help.  Wash your feet with warm water (do not use hot water) and mild soap. Then pat your feet and the areas between your toes until they are completely dry. Do not soak your feet as this can dry your skin.  Apply a moisturizing lotion or petroleum jelly (that does not contain alcohol and is unscented) to the skin on your feet and to dry, brittle toenails. Do not apply lotion between your toes.  Trim your toenails straight across. Do not dig under them or around the cuticle. File the edges of your nails with an emery board or nail file.  Do not cut corns or calluses or try to remove them with medicine.  Wear clean socks or stockings every day. Make sure they are not too tight. Do not wear knee-high stockings since they may decrease blood flow to your legs.  Wear shoes that fit properly and have enough cushioning. To break in new shoes, wear them for just a few hours a day. This prevents you from injuring your feet. Always look in your shoes before you put them on to be sure there are no objects inside.  Do not cross your legs. This may decrease the blood flow to your feet.  If you find a minor scrape,  cut, or break in the skin on your feet, keep it and the skin around it clean and dry. These areas may be cleansed with mild soap and water. Do not cleanse the area with peroxide, alcohol, or iodine.  When you remove an adhesive bandage, be sure not to damage the skin around it.  If you have a wound, look at it several times a day to make sure it is healing.  Do not use heating pads or hot water bottles. They may burn your skin. If you have lost feeling in your feet or legs, you may not know it is happening until it is too late.  Make sure your health care provider performs a complete foot exam at least annually or more often if you have foot problems. Report any cuts, sores, or bruises to your health care provider immediately. SEEK MEDICAL CARE IF:   You have an injury that is not healing.  You have cuts or breaks in the skin.  You have an ingrown nail.  You notice redness on your legs or feet.  You feel burning or tingling in your legs or feet.  You have pain or cramps in your legs and feet.  Your legs or feet are numb.  Your feet always feel cold. SEEK IMMEDIATE MEDICAL CARE IF:   There is increasing redness,   swelling, or pain in or around a wound.  There is a red line that goes up your leg.  Pus is coming from a wound.  You develop a fever or as directed by your health care provider.  You notice a bad smell coming from an ulcer or wound. Document Released: 09/10/2000 Document Revised: 05/16/2013 Document Reviewed: 02/20/2013 [employer] Patient Information 2014 [employer].  

## 2013-12-25 NOTE — Progress Notes (Signed)
   Subjective:    Patient ID: MACK THURMON, female    DOB: 1940-11-19, 73 y.o.   MRN: [id number]  HPI TOENAILS TRIM.   Review of Systems other new changes or findings at this time     Objective:   Physical Exam Okay objective findings as follows masker status appears to be intact with pedal pulses palpable DP +2/4 bilateral PT thready nonpalpable bilateral capillary refill time 3 seconds all digits skin temperature warm turgor diminished there is mild edema no rubor pallor or varicosities noted. Neurologically epicritic and proprioceptive sensations are decreased on Semmes Weinstein testing to the digits plantar arch. There is normal plantar response DTRs are listed dermatologic the skin color pigment normal hair absent nails thick it'll crumbly and criptotic incurvated elongated along times a day been cut there is also keratoses sub-5 bilateral hemorrhage a keratoses no open wounds ulcerations no secondary infection is noted. Orthopedic biomechanical exam mild digital contractures noted no other osseous abnormalities identified       Assessment & Plan:  Assessment this time diabetes with peripheral neuropathy and complications patient has thick brittle crumbly different dystrophic nails debrided 1 through 5 bilateral this time the presence of diabetes and applications also debridement multiple keratoses keratoses keratotic lesion sub-fifth MTP area bilateral. Return at future for palliative nail care is needed suggest a 3 month followup for mycotic and dystrophic nail care in the presence of diabetes as well as multiple keratoses debridement  Aurianna Masson DPM

## 2014-02-15 ENCOUNTER — Encounter: Payer: Self-pay | Admitting: *Deleted

## 2014-02-15 ENCOUNTER — Ambulatory Visit (INDEPENDENT_AMBULATORY_CARE_PROVIDER_SITE_OTHER): Payer: Medicare Other | Admitting: Cardiology

## 2014-02-15 ENCOUNTER — Telehealth: Payer: Self-pay | Admitting: Cardiology

## 2014-02-15 ENCOUNTER — Encounter (HOSPITAL_COMMUNITY): Payer: Medicare Other

## 2014-02-15 VITALS — BP 152/86 | HR 53 | Resp 20 | Ht 60.0 in | Wt 159.4 lb

## 2014-02-15 DIAGNOSIS — I5032 Chronic diastolic (congestive) heart failure: Secondary | ICD-10-CM | POA: Diagnosis not present

## 2014-02-15 DIAGNOSIS — R0989 Other specified symptoms and signs involving the circulatory and respiratory systems: Secondary | ICD-10-CM

## 2014-02-15 DIAGNOSIS — E7800 Pure hypercholesterolemia, unspecified: Secondary | ICD-10-CM

## 2014-02-15 DIAGNOSIS — I509 Heart failure, unspecified: Secondary | ICD-10-CM | POA: Diagnosis not present

## 2014-02-15 DIAGNOSIS — I25810 Atherosclerosis of coronary artery bypass graft(s) without angina pectoris: Secondary | ICD-10-CM

## 2014-02-15 DIAGNOSIS — I10 Essential (primary) hypertension: Secondary | ICD-10-CM

## 2014-02-15 MED ORDER — NITROGLYCERIN 0.4 MG SL SUBL
0.4000 mg | SUBLINGUAL_TABLET | SUBLINGUAL | Status: DC | PRN
Start: 2014-02-15 — End: 2015-05-07

## 2014-02-15 MED ORDER — CARVEDILOL 6.25 MG PO TABS: 6.2500 mg | ORAL_TABLET | Freq: Two times a day (BID) | ORAL | Status: DC

## 2014-02-15 NOTE — Telephone Encounter (Signed)
New message    Patient would like to be seen today  C/O dizziness, No feeling well . Took a nitro on yesterday . Took tylenol to help with her finger.

## 2014-02-15 NOTE — Patient Instructions (Signed)
Increase coreg (carvedilol) to 6.25mg  two times a day.  Your physician has requested that you have a lexiscan myoview. For further information please visit [email or web address]. Please follow instruction sheet, as given. MUST BE Tuesday May 26,2015 PER DR [facility]  Your physician has requested that you have a carotid duplex. This test is an ultrasound of the carotid arteries in your neck. It looks at blood flow through these arteries that supply the brain with blood. Allow one hour for this exam. There are no restrictions or special instructions.  Your physician recommends that you schedule a follow-up appointment already scheduled June 10,2015

## 2014-02-15 NOTE — Telephone Encounter (Signed)
Pt added on to Dr Claris Gladden schedule today.

## 2014-02-15 NOTE — Telephone Encounter (Signed)
Pt states she has not been feeling good in her chest for about 1 week. She states it has been off and on associated with dizziness, feeling sick in her head, tiredness and weakness . Pt had chest pain into her ears last night and took 1 NTG with relief.

## 2014-02-16 ENCOUNTER — Encounter: Payer: Self-pay | Admitting: Cardiology

## 2014-02-16 NOTE — Progress Notes (Signed)
Patient ID: Christine Gay, female   DOB: 1941/07/14, 73 y.o.   MRN: [id number] PCP: Dr. Delfina Redwood  73 yo with history of CAD s/p CABG, DM, and HTN presents for cardiology followup.  She had a Lexiscan Cardiolite in 2014 that was an intermediate risk study and echo showed basal to mid inferior hypokinesis with EF 55-60%.  I did a cardaic catheterization in 8/14 that showed patent LIMA-LAD, occluded SVG-OM1, and 95% ostial LCx stenosis.  She had PCI to ostial LCx with Promus DES.    She initially felt much better after PCI with no chest pain.  However, last week she noted chest pain after walking about a block that resolved with rest.  On last Friday, she again had chest pain radiating to her neck (she's not sure what she was doing and the pain was transient).  Last night, she had chest pain radiating to her neck while walking.  This resolved with 1 NTG.  Finally, this morning she woke up with chest pain radiating to her neck that resolved spontaneously in 5-10 minutes.  She has mild exertional dyspnea after walking 1 block chronically.  Weight is down 11 lbs since last appointment.  She has been trying to lose weight.   Labs (3/13): K 4.2, creatinine 0.77 Labs (6/14): LDL 80, HDL 69 Labs (8/14): BNP 971 => 420 => 138, creatinine 0.8, K 4.3 Labs (11/14): K 4.1, creatinine 0.8, BNP 88 Labs (3/15): K 3.4, creatinine 0.86, LDL 85  ECG: NSR with LBBB   PMH: 1. GERD 2. Seizure disorder 3. Type II diabetes 4. Chronic LBBB 5. CAD: s/p CABG in 1992, had PCI several years ago.  Echo (2/11) with EF 55%, moderate LVH, aortic sclerosis.  LHC (8/14) with patent LIMA-LAD, totally occluded SVG-OM1, 95% ostial LCx, totally occluded small nondominant RCA with EF 55%.  Patient had Promus DES to pLCx.  6. Glaucoma 7. Hypothyroidism 8. HTN 9. Cholecystectomy 10. Diastolic CHF: Echo (6/26) with EF 55-60%, basal to mid inferior hypokinesis, mild MR and mild AI.  SH: Widow, lives in [place], does not smoke.    FH: No premature CAD.  Diabetes.   ROS: All systems reviewed and negative except as per HPI.   Current Outpatient Prescriptions  Medication Sig Dispense Refill  . amLODipine (NORVASC) 5 MG tablet Take 1 tablet (5 mg total) by mouth daily.  30 tablet  6  . aspirin 81 MG tablet Take 81 mg by mouth daily.      Marland Kitchen atorvastatin (LIPITOR) 80 MG tablet Take 80 mg by mouth daily.      . brimonidine-timolol (COMBIGAN) 0.2-0.5 % ophthalmic solution Place 1 drop into both eyes daily.       . clopidogrel (PLAVIX) 75 MG tablet Take 1 tablet (75 mg total) by mouth daily.  30 tablet  11  . furosemide (LASIX) 40 MG tablet 1 tablet (total 40mg ) in the AM and 1/2 tablet (total 20mg ) in the PM  50 tablet  1  . ibandronate (BONIVA) 150 MG tablet Take 150 mg by mouth every 30 (thirty) days. Take in the morning with a full glass of water, on an empty stomach, and do not take anything else by mouth or lie down for the next 30 min.      . isosorbide mononitrate (IMDUR) 60 MG 24 hr tablet TAKE 2 TABLETS BY MOUTH 2 TIMES DAILY.  360 tablet  0  . levothyroxine (SYNTHROID, LEVOTHROID) 100 MCG tablet Take 100 mcg by mouth daily before breakfast.      .  metFORMIN (GLUCOPHAGE-XR) 500 MG 24 hr tablet Take 1 tablet (500 mg total) by mouth 2 (two) times daily.      . nitroGLYCERIN (NITROSTAT) 0.4 MG SL tablet Place 1 tablet (0.4 mg total) under the tongue every 5 (five) minutes as needed for chest pain.  25 tablet  11  . pantoprazole (PROTONIX) 40 MG tablet Take 1 tablet (40 mg total) by mouth daily.  30 tablet  11  . phenytoin (DILANTIN) 100 MG ER capsule Take 100-200 mg by mouth 2 (two) times daily. Takes 2 tablets in am and 1 tablet in pm      . potassium chloride SA (K-DUR,KLOR-CON) 20 MEQ tablet 1 tablet (total 20 mEq) in the AM and 1/2 tablet (total 10 mEq)  50 tablet  3  . prednisoLONE sodium phosphate (INFLAMASE FORTE) 1 % ophthalmic solution Place 1 drop into both eyes every other day. Every other day      .  carvedilol (COREG) 6.25 MG tablet Take 1 tablet (6.25 mg total) by mouth 2 (two) times daily.  60 tablet  3  . loteprednol (LOTEMAX) 0.5 % ophthalmic suspension Place 1 drop into both eyes daily.        No current facility-administered medications for this visit.    BP 152/86  Pulse 53  Resp 20  Ht 5' (1.524 m)  Wt 159 lb 6.4 oz (72.303 kg)  BMI 31.13 kg/m2  SpO2 99% General: NAD Neck: JVP 7-8 cm, no thyromegaly or thyroid nodule.  Lungs: Clear to auscultation bilaterally with normal respiratory effort. CV: Nondisplaced PMI.  Heart regular S1/S2, no S3/S4, 1/6 early SEM.  Trace ankle edema bilaterally.  Right carotid bruit.   Abdomen: Soft, nontender, no hepatosplenomegaly, no distention.  Skin: Intact without lesions or rashes.  Neurologic: Alert and oriented x 3.  Psych: Normal affect. Extremities: No clubbing or cyanosis.   Assessment/Plan: 1. CAD: Status post Promus DES to ostial LCx in 8/14.  Chest pain resolved after PCI in 8/14.  However, over the last week she has had 4 episodes of transient chest pain.   - Pain has been short-lived and relatively mild.  I will arrange for Lexiscan Cardiolite early next week.  If she has any prolonged pain or pain becomes more frequent, I told her to go to the ER.   - Continue ASA 81, statin, Plavix.  - She is on maximal Imdur (120 mg) already.  I will increase Coreg to 6.25 mg bid.  2. Diastolic CHF: EF 60-45% on echo.  Volume stable with NYHA class II symptoms.  Continue current Lasix.  3. Hyperlipidemia: Continue statin with known CAD.  Recent lipids looked ok.  4. Carotid bruit: Right carotid bruit, I will arrange for carotid dopplers.  Followup 2 wks.   Larey Dresser 02/16/2014

## 2014-02-19 ENCOUNTER — Ambulatory Visit (HOSPITAL_COMMUNITY): Payer: Medicare Other | Attending: Cardiology | Admitting: Radiology

## 2014-02-19 VITALS — BP 128/51 | HR 49 | Ht 60.0 in | Wt 155.0 lb

## 2014-02-19 DIAGNOSIS — R079 Chest pain, unspecified: Secondary | ICD-10-CM | POA: Diagnosis not present

## 2014-02-19 DIAGNOSIS — I2510 Atherosclerotic heart disease of native coronary artery without angina pectoris: Secondary | ICD-10-CM | POA: Diagnosis not present

## 2014-02-19 DIAGNOSIS — R0609 Other forms of dyspnea: Secondary | ICD-10-CM | POA: Insufficient documentation

## 2014-02-19 DIAGNOSIS — I25810 Atherosclerosis of coronary artery bypass graft(s) without angina pectoris: Secondary | ICD-10-CM

## 2014-02-19 DIAGNOSIS — I5032 Chronic diastolic (congestive) heart failure: Secondary | ICD-10-CM

## 2014-02-19 DIAGNOSIS — R0989 Other specified symptoms and signs involving the circulatory and respiratory systems: Secondary | ICD-10-CM | POA: Insufficient documentation

## 2014-02-19 MED ORDER — TECHNETIUM TC 99M SESTAMIBI GENERIC - CARDIOLITE
33.0000 | Freq: Once | INTRAVENOUS | Status: AC | PRN
  Administered 2014-02-19: 33 via INTRAVENOUS

## 2014-02-19 MED ORDER — REGADENOSON 0.4 MG/5ML IV SOLN
0.4000 mg | Freq: Once | INTRAVENOUS | Status: AC
  Administered 2014-02-19: 0.4 mg via INTRAVENOUS

## 2014-02-19 MED ORDER — TECHNETIUM TC 99M SESTAMIBI GENERIC - CARDIOLITE
11.0000 | Freq: Once | INTRAVENOUS | Status: AC | PRN
  Administered 2014-02-19: 11 via INTRAVENOUS

## 2014-02-19 NOTE — Progress Notes (Addendum)
[facility] SITE 3 NUCLEAR MED [address] [place], [place] [address] [phone]    Cardiology Nuclear Med Study  Christine Gay is a 73 y.o. female     MRN : [id number]     DOB: 20-Sep-1941  Procedure Date: 02/19/2014  Nuclear Med Background Indication for Stress Test:  Evaluation for Ischemia, Graft Patency and Stent Patency History:  CAD, MI, Cath, CABG, Stent (ostial cx 2014), CHF, Echo 2014 EF 55-65% (mild AI), MPI 2014 EF 43% (small fixed ant. defect), hx seizures, glaucoma Cardiac Risk Factors: Hypertension ,NIDDM, LBBB and Lipids  Symptoms:  Chest Pain with Exertion and DOE   Nuclear Pre-Procedure Caffeine/Decaff Intake:  None> 12 hrs NPO After: 5:30pm   Lungs:  clear O2 Sat: 91% on room air. IV 0.9% NS with Angio Cath:  22g  IV Site: R Antecubital x 1, tolerated well IV Started by:  Irven Baltimore, RN  Chest Size (in):  42 Cup Size: C  Height: 5' (1.524 m)  Weight:  155 lb (70.308 kg)  BMI:  Body mass index is 30.27 kg/(m^2). Tech Comments:  Held Metformin this am. Patient took Coreg and Dilantin this am.    Nuclear Med Study 1 or 2 day study: 1 day  Stress Test Type:  Lexiscan  Reading MD: N/A  Order Authorizing Provider:  [name], MD  Resting Radionuclide: Technetium 4m Sestamibi  Resting Radionuclide Dose: 11.0 mCi   Stress Radionuclide:  Technetium 41m Sestamibi  Stress Radionuclide Dose: 33.0 mCi           Stress Protocol Rest HR: 49 Stress HR: 80  Rest BP: 128/51 Stress BP: 112/43  Exercise Time (min): n/a METS: n/a           Dose of Adenosine (mg):  n/a Dose of Lexiscan: 0.4 mg  Dose of Atropine (mg): n/a Dose of Dobutamine: n/a mcg/kg/min (at max HR)  Stress Test Technologist: Glade Lloyd, BS-ES  Nuclear Technologist:  Charlton Amor, CNMT     Rest Procedure:  Myocardial perfusion imaging was performed at rest 45 minutes following the intravenous administration of Technetium 33m Sestamibi. Rest ECG: NSR-LBBB  Stress  Procedure:  The patient received IV Lexiscan 0.4 mg over 15-seconds.  Technetium 32m Sestamibi injected at 30-seconds.  Quantitative spect images were obtained after a 45 minute delay.  During the infusion of Lexican the patient complained of SOB, lightheadedness, chest heaviness and stomach bloating.  These symptoms began to ease in recovery.  Stress ECG: No significant change from baseline ECG  QPS Raw Data Images:  There is a breast shadow that accounts for the anterior attenuation. Stress Images:  There is mild attenuation of the mid - distal anterior wall.  The uptake in the other areas is normal.   Rest Images:  There is mild attenuation of the mid - distal anterior wall.  The uptake in the other areas is normal. Subtraction (SDS):  No evidence of ischemia. Transient Ischemic Dilatation (Normal <1.22):  1.02 Lung/Heart Ratio (Normal <0.45):  0.27  Quantitative Gated Spect Images QGS EDV:  108 ml QGS ESV:  50 ml  Impression Exercise Capacity:  Lexiscan with no exercise. BP Response:  Normal blood pressure response. Clinical Symptoms:  No significant symptoms noted. ECG Impression:  No significant ST segment change suggestive of ischemia. Comparison with Prior Nuclear Study:   The mild anterior attenuation due to breast shadow is unchanged.  The overall LV function has improved.   There are no segmental wall motion  abnormalities.   Overall Impression:  Low risk stress nuclear study .  Mild breast attenuation.  LV Ejection Fraction: 54%.  LV Wall Motion:  NL LV Function; NL Wall Motion.   Thayer Headings, Brooke Bonito., MD, [facility] 02/19/2014, 5:12 PM [address]  [address] Office - [phone] Pager [phone]  EF 54%, mild breast attenuation but no evidence for ischemia or infarction.  Low risk study.  Will see her back in the office to see how she is doing. '  Larey Dresser 02/20/2014

## 2014-02-19 NOTE — Addendum Note (Signed)
Addended by: Katrine Coho on: 02/19/2014 05:27 PM   Modules accepted: Orders

## 2014-02-20 NOTE — Progress Notes (Signed)
Pt.notified

## 2014-02-23 ENCOUNTER — Other Ambulatory Visit: Payer: Self-pay | Admitting: Cardiology

## 2014-02-26 ENCOUNTER — Other Ambulatory Visit: Payer: Self-pay | Admitting: Cardiology

## 2014-02-26 ENCOUNTER — Ambulatory Visit (HOSPITAL_COMMUNITY): Payer: Medicare Other | Attending: Cardiology | Admitting: *Deleted

## 2014-02-26 DIAGNOSIS — I5032 Chronic diastolic (congestive) heart failure: Secondary | ICD-10-CM | POA: Diagnosis not present

## 2014-02-26 DIAGNOSIS — I6529 Occlusion and stenosis of unspecified carotid artery: Secondary | ICD-10-CM

## 2014-02-26 DIAGNOSIS — I25810 Atherosclerosis of coronary artery bypass graft(s) without angina pectoris: Secondary | ICD-10-CM

## 2014-02-26 DIAGNOSIS — Z951 Presence of aortocoronary bypass graft: Secondary | ICD-10-CM | POA: Insufficient documentation

## 2014-02-26 DIAGNOSIS — I2510 Atherosclerotic heart disease of native coronary artery without angina pectoris: Secondary | ICD-10-CM | POA: Diagnosis not present

## 2014-02-26 DIAGNOSIS — I509 Heart failure, unspecified: Secondary | ICD-10-CM | POA: Insufficient documentation

## 2014-02-26 DIAGNOSIS — R0989 Other specified symptoms and signs involving the circulatory and respiratory systems: Secondary | ICD-10-CM

## 2014-02-26 NOTE — Progress Notes (Signed)
Carotid duplex complete 

## 2014-02-28 DIAGNOSIS — E119 Type 2 diabetes mellitus without complications: Secondary | ICD-10-CM | POA: Diagnosis not present

## 2014-02-28 DIAGNOSIS — M899 Disorder of bone, unspecified: Secondary | ICD-10-CM | POA: Diagnosis not present

## 2014-02-28 DIAGNOSIS — M949 Disorder of cartilage, unspecified: Secondary | ICD-10-CM | POA: Diagnosis not present

## 2014-02-28 DIAGNOSIS — E782 Mixed hyperlipidemia: Secondary | ICD-10-CM | POA: Diagnosis not present

## 2014-02-28 DIAGNOSIS — I10 Essential (primary) hypertension: Secondary | ICD-10-CM | POA: Diagnosis not present

## 2014-02-28 DIAGNOSIS — E039 Hypothyroidism, unspecified: Secondary | ICD-10-CM | POA: Diagnosis not present

## 2014-02-28 NOTE — Progress Notes (Signed)
Quick Note:  Patient notified of Carotid Doppler results and discussed in detail. Patient verbalized understanding and appreciation for information. Provided information regarding importance of taking ASA daily. Patient confirms that she is compliant with her medications. Requested copy of results be sent to Dr. Maudry Mayhew. Completed. ______

## 2014-03-06 ENCOUNTER — Ambulatory Visit (INDEPENDENT_AMBULATORY_CARE_PROVIDER_SITE_OTHER): Payer: Medicare Other | Admitting: Physician Assistant

## 2014-03-06 ENCOUNTER — Encounter: Payer: Self-pay | Admitting: Physician Assistant

## 2014-03-06 VITALS — BP 119/67 | HR 63 | Ht 60.0 in | Wt 156.8 lb

## 2014-03-06 DIAGNOSIS — I10 Essential (primary) hypertension: Secondary | ICD-10-CM | POA: Diagnosis not present

## 2014-03-06 DIAGNOSIS — I25810 Atherosclerosis of coronary artery bypass graft(s) without angina pectoris: Secondary | ICD-10-CM | POA: Diagnosis not present

## 2014-03-06 DIAGNOSIS — I509 Heart failure, unspecified: Secondary | ICD-10-CM

## 2014-03-06 NOTE — Progress Notes (Signed)
HPI: This is a very pleasant 73 year old female patient of Dr. Aundra Dubin who he saw her recently with recurrent chest pain. She has a history of CAD status post CABG followed by a drug-eluting stent the ostial circumflex in 04/2013. The LIMA to the LAD was patent, SVG to the OM1 was occluded. He ordered a Lexi scan Cardiolite which was negative for ischemia and carotid Dopplers were stable. See below for details. He also increase her Coreg to 6.25 twice a day.  She's had no further chest pain, palpitations, dyspnea, dyspnea on exertion, dizziness or presyncope. Patient is feeling quite well. She is getting ready to go stay with her son in [place] for several months.     Allergies: -- Meperidine Hcl -- Other (See Comments)   --  Demerol - mental status changes  -- Nitroglycerin -- Other (See Comments)   --  Patch and Cream only - blisters  -- Morphine -- Palpitations  Current Outpatient Prescriptions on File Prior to Visit: amLODipine (NORVASC) 5 MG tablet, TAKE 1 TABLET BY MOUTH DAILY., Disp: 90 tablet, Rfl: 2 aspirin 81 MG tablet, Take 81 mg by mouth daily., Disp: , Rfl:  atorvastatin (LIPITOR) 80 MG tablet, Take 80 mg by mouth daily., Disp: , Rfl:  brimonidine-timolol (COMBIGAN) 0.2-0.5 % ophthalmic solution, Place 1 drop into both eyes daily. , Disp: , Rfl:  carvedilol (COREG) 6.25 MG tablet, Take 1 tablet (6.25 mg total) by mouth 2 (two) times daily., Disp: 60 tablet, Rfl: 3 clopidogrel (PLAVIX) 75 MG tablet, Take 1 tablet (75 mg total) by mouth daily., Disp: 30 tablet, Rfl: 11 furosemide (LASIX) 40 MG tablet, 1 tablet (total 40mg ) in the AM and 1/2 tablet (total 20mg ) in the PM, Disp: 50 tablet, Rfl: 1 ibandronate (BONIVA) 150 MG tablet, Take 150 mg by mouth every 30 (thirty) days. Take in the morning with a full glass of water, on an empty stomach, and do not take anything else by mouth or lie down for the next 30 min., Disp: , Rfl:  isosorbide mononitrate (IMDUR) 60 MG 24 hr tablet,  TAKE 2 TABLETS BY MOUTH 2 TIMES DAILY., Disp: 360 tablet, Rfl: 0 levothyroxine (SYNTHROID, LEVOTHROID) 100 MCG tablet, Take 100 mcg by mouth daily before breakfast., Disp: , Rfl:  loteprednol (LOTEMAX) 0.5 % ophthalmic suspension, Place 1 drop into both eyes daily. , Disp: , Rfl:  metFORMIN (GLUCOPHAGE-XR) 500 MG 24 hr tablet, Take 1 tablet (500 mg total) by mouth 2 (two) times daily., Disp: , Rfl:  nitroGLYCERIN (NITROSTAT) 0.4 MG SL tablet, Place 1 tablet (0.4 mg total) under the tongue every 5 (five) minutes as needed for chest pain., Disp: 25 tablet, Rfl: 11 pantoprazole (PROTONIX) 40 MG tablet, TAKE 1 TABLET BY MOUTH DAILY., Disp: 90 tablet, Rfl: 2 phenytoin (DILANTIN) 100 MG ER capsule, Take 100-200 mg by mouth 2 (two) times daily. Takes 2 tablets in am and 1 tablet in pm, Disp: , Rfl:  potassium chloride SA (K-DUR,KLOR-CON) 20 MEQ tablet, TAKE 1 TABLET BY MOUTH EVERY MORNING AND 1/2 TABLET EVERY EVENING., Disp: 135 tablet, Rfl: 0 prednisoLONE sodium phosphate (INFLAMASE FORTE) 1 % ophthalmic solution, Place 1 drop into both eyes every other day. Every other day, Disp: , Rfl:   No current facility-administered medications on file prior to visit.   Past Medical History:   CAD (coronary artery disease)  Comment:a. s/p CABG 1992, b. s/p PCI several years ago;              c. LHC 11/08: L-LAD ok, S-OM1 ok, S-OM2 CTO,               prox to mid CFX 70% (unchanged), RCA               non-dominant, occluded;  d.  Echo 2/11: EF 55%,              mod LVH, Ao sclerosis;  e. Echo 6/14: mild LVH,              mild focal basal sept hypertrophy, EF 55-65%,               inf HK, Mild AI, mild MR, mild BAE;  f. Myoview              7/14: int risk-> cath 04/2013 s/p PTCA/DES to               prox Cx 05/04/13.   Obesity                                                      Seizure disorder                                             GERD (gastroesophageal reflux disease)                        LBBB (left bundle branch block)                                Comment:chronic   Glaucoma                                                     Hypothyroidism                                               Hypertension                                                 Diabetes mellitus                                            Anemia  Comment:a. Noted on labs 04/2013.   Chronic diastolic CHF (congestive heart failur*             Past Surgical History:   CORONARY ARTERY BYPASS GRAFT                                  CATARACT EXTRACTION                                           CHOLECYSTECTOMY                                               TUBAL LIGATION                                  Bilateral              CORONARY ANGIOPLASTY WITH STENT PLACEMENT        05/04/2013     Comment:DES  to [place]  Review of patient's family history indicates:   Diabetes                       Mother                   Social History   Marital Status: Married             Spouse Name:                      Years of Education:                 Number of children:             Occupational History   None on file  Social History Main Topics   Smoking Status: Never Smoker                     Smokeless Status: Never Used                       Alcohol Use: No             Drug Use: No             Sexual Activity: Not on file        Other Topics            Concern   None on file  Social History Narrative   None on file    ROS: See history of present illness otherwise negative   PHYSICAL EXAM: Well-nournished, in no acute distress. Neck: Right carotid bruit, No JVD, HJR, or thyroid enlargement  Lungs: No tachypnea, clear without wheezing, rales, or rhonchi  Cardiovascular: RRR, PMI not displaced, 2/6 systolic murmur at the left sternal border, no gallops, bruit, thrill, or heave.  Abdomen: BS normal. Soft without organomegaly,  masses, lesions or tenderness.  Extremities: without cyanosis, clubbing or edema. Good distal pulses bilateral  SKin: Warm, no lesions or rashes   Musculoskeletal: No deformities  Neuro: no focal signs  BP 119/67  Pulse 63  Ht 5' (1.524 m)  Wt 156 lb 12.8 oz (71.124 kg)  BMI 30.62 kg/m2   Overall Impression:  Low risk stress nuclear study .  Mild breast attenuation.  LV Ejection Fraction: 54%.  LV Wall Motion:  NL LV Function; NL Wall Motion.   Thayer Headings, Brooke Bonito., MD, [facility] 02/19/2014, 5:12 PM

## 2014-03-06 NOTE — Patient Instructions (Signed)
Your physician recommends that you schedule a follow-up appointment in: WITH DR. Aundra Dubin IN 4 MONTHS  Your physician recommends that you continue on your current medications as directed. Please refer to the Current Medication list given to you today.

## 2014-03-06 NOTE — Assessment & Plan Note (Signed)
No evidence of heart failure. 

## 2014-03-06 NOTE — Assessment & Plan Note (Signed)
Chest pain resolved on increased Coreg. Lexiscan Cardiolite negative for ischemia. Continue medical therapy. Followup with Dr. Algernon Huxley in 2 months.

## 2014-03-06 NOTE — Assessment & Plan Note (Signed)
Blood pressure controlled. 

## 2014-03-11 DIAGNOSIS — H4011X0 Primary open-angle glaucoma, stage unspecified: Secondary | ICD-10-CM | POA: Diagnosis not present

## 2014-03-11 DIAGNOSIS — H20019 Primary iridocyclitis, unspecified eye: Secondary | ICD-10-CM | POA: Diagnosis not present

## 2014-03-19 ENCOUNTER — Ambulatory Visit (INDEPENDENT_AMBULATORY_CARE_PROVIDER_SITE_OTHER): Payer: Medicare Other

## 2014-03-19 DIAGNOSIS — E1140 Type 2 diabetes mellitus with diabetic neuropathy, unspecified: Secondary | ICD-10-CM

## 2014-03-19 DIAGNOSIS — E1142 Type 2 diabetes mellitus with diabetic polyneuropathy: Secondary | ICD-10-CM | POA: Diagnosis not present

## 2014-03-19 DIAGNOSIS — E1149 Type 2 diabetes mellitus with other diabetic neurological complication: Secondary | ICD-10-CM

## 2014-03-19 DIAGNOSIS — L608 Other nail disorders: Secondary | ICD-10-CM

## 2014-03-19 DIAGNOSIS — Q828 Other specified congenital malformations of skin: Secondary | ICD-10-CM | POA: Diagnosis not present

## 2014-03-19 NOTE — Progress Notes (Signed)
   Subjective:    Patient ID: Christine Gay, female    DOB: May 22, 1941, 73 y.o.   MRN: [id number]  HPI Comments: Pt presents for debridement of 10 toenails.     Review of Systems no new findings or systemic changes noted     Objective:   Physical Exam Or extremity objective findings as follows vascular status is intact DP +2/4 PT nonpalpable bilateral thready there is plus one +2 edema history of previous surgeries in both legs with graft harvesting for vascular surgery. Patient has Refill timed 3-4 seconds all digits epicritic and proprioceptive sensations diminished on Semmes Weinstein testing to forefoot digits and plantar arch is normal plantar response DTRs not elicited dermatologically skin color pigment normal hair growth absent nails criptotic incurvated friable brittle one through 5 bilateral. Is also keratoses sub-fifth bilateral secondary plantigrade metatarsal and digital contractures there is no open wounds ulcerations no secondary infection is noted at this time.       Assessment & Plan:  Assessment diabetes with peripheral neuropathy and complications thick brittle dystrophic friable criptotic nails debrided 1 through 5 bilateral at this time also debridement multiple keratoses sub-fifth MTP area bilateral patient will get authorization for diabetic extra-depth shoes patient is a candidate for shoes been almost 2 years since they were placed in a new shoes and use of insoles some point in the near future will get authorizations this time and likely at next visit we'll do a scan or measurement and fitting if needed  Kately Masson DPM

## 2014-03-19 NOTE — Patient Instructions (Signed)
Diabetes and Foot Care Diabetes may cause you to have problems because of poor blood supply (circulation) to your feet and legs. This may cause the skin on your feet to become thinner, break easier, and heal more slowly. Your skin may become dry, and the skin may peel and crack. You may also have nerve damage in your legs and feet causing decreased feeling in them. You may not notice minor injuries to your feet that could lead to infections or more serious problems. Taking care of your feet is one of the most important things you can do for yourself.  HOME CARE INSTRUCTIONS  Wear shoes at all times, even in the house. Do not go barefoot. Bare feet are easily injured.  Check your feet daily for blisters, cuts, and redness. If you cannot see the bottom of your feet, use a mirror or ask someone for help.  Wash your feet with warm water (do not use hot water) and mild soap. Then pat your feet and the areas between your toes until they are completely dry. Do not soak your feet as this can dry your skin.  Apply a moisturizing lotion or petroleum jelly (that does not contain alcohol and is unscented) to the skin on your feet and to dry, brittle toenails. Do not apply lotion between your toes.  Trim your toenails straight across. Do not dig under them or around the cuticle. File the edges of your nails with an emery board or nail file.  Do not cut corns or calluses or try to remove them with medicine.  Wear clean socks or stockings every day. Make sure they are not too tight. Do not wear knee-high stockings since they may decrease blood flow to your legs.  Wear shoes that fit properly and have enough cushioning. To break in new shoes, wear them for just a few hours a day. This prevents you from injuring your feet. Always look in your shoes before you put them on to be sure there are no objects inside.  Do not cross your legs. This may decrease the blood flow to your feet.  If you find a minor scrape,  cut, or break in the skin on your feet, keep it and the skin around it clean and dry. These areas may be cleansed with mild soap and water. Do not cleanse the area with peroxide, alcohol, or iodine.  When you remove an adhesive bandage, be sure not to damage the skin around it.  If you have a wound, look at it several times a day to make sure it is healing.  Do not use heating pads or hot water bottles. They may burn your skin. If you have lost feeling in your feet or legs, you may not know it is happening until it is too late.  Make sure your health care provider performs a complete foot exam at least annually or more often if you have foot problems. Report any cuts, sores, or bruises to your health care provider immediately. SEEK MEDICAL CARE IF:   You have an injury that is not healing.  You have cuts or breaks in the skin.  You have an ingrown nail.  You notice redness on your legs or feet.  You feel burning or tingling in your legs or feet.  You have pain or cramps in your legs and feet.  Your legs or feet are numb.  Your feet always feel cold. SEEK IMMEDIATE MEDICAL CARE IF:   There is increasing redness,   swelling, or pain in or around a wound.  There is a red line that goes up your leg.  Pus is coming from a wound.  You develop a fever or as directed by your health care provider.  You notice a bad smell coming from an ulcer or wound. Document Released: 09/10/2000 Document Revised: 05/16/2013 Document Reviewed: 02/20/2013 Exit[employer] Patient Information 2015 [employer]. This information is not intended to replace advice given to you by your health care provider. Make sure you discuss any questions you have with your health care provider.  

## 2014-03-22 ENCOUNTER — Ambulatory Visit: Payer: Medicare Other

## 2014-03-28 ENCOUNTER — Ambulatory Visit: Payer: Medicare Other | Admitting: Cardiology

## 2014-03-29 ENCOUNTER — Other Ambulatory Visit: Payer: Self-pay | Admitting: Cardiology

## 2014-04-19 ENCOUNTER — Ambulatory Visit (INDEPENDENT_AMBULATORY_CARE_PROVIDER_SITE_OTHER): Payer: Medicare Other | Admitting: *Deleted

## 2014-04-19 DIAGNOSIS — Q828 Other specified congenital malformations of skin: Secondary | ICD-10-CM

## 2014-04-19 NOTE — Progress Notes (Signed)
   Subjective:    Patient ID: Christine Gay, female    DOB: 1940-12-18, 73 y.o.   MRN: [id number]  HPI DIABETIC SHOE MEASUREMENT.   Review of Systems     Objective:   Physical Exam        Assessment & Plan:

## 2014-04-29 ENCOUNTER — Other Ambulatory Visit: Payer: Self-pay | Admitting: Cardiology

## 2014-04-29 ENCOUNTER — Other Ambulatory Visit: Payer: Self-pay | Admitting: Physician Assistant

## 2014-05-14 ENCOUNTER — Ambulatory Visit (INDEPENDENT_AMBULATORY_CARE_PROVIDER_SITE_OTHER): Payer: Medicare Other

## 2014-05-14 DIAGNOSIS — E1149 Type 2 diabetes mellitus with other diabetic neurological complication: Secondary | ICD-10-CM

## 2014-05-14 DIAGNOSIS — E1142 Type 2 diabetes mellitus with diabetic polyneuropathy: Secondary | ICD-10-CM

## 2014-05-14 DIAGNOSIS — Q828 Other specified congenital malformations of skin: Secondary | ICD-10-CM

## 2014-05-14 DIAGNOSIS — L608 Other nail disorders: Secondary | ICD-10-CM

## 2014-05-14 DIAGNOSIS — E1140 Type 2 diabetes mellitus with diabetic neuropathy, unspecified: Secondary | ICD-10-CM

## 2014-05-14 NOTE — Progress Notes (Signed)
   Subjective:    Patient ID: Christine Gay, female    DOB: 1941-01-04, 73 y.o.   MRN: [id number]  HPI Comments: Pt is fitted for diabetic shoes Baldo Ash [id number] size 6.5 XW and custom molded inserts.  Pt states she likes the shoes and they fit well.     Review of Systems     Objective:   Physical Exam        Assessment & Plan:

## 2014-05-14 NOTE — Patient Instructions (Signed)
Diabetes and Foot Care Diabetes may cause you to have problems because of poor blood supply (circulation) to your feet and legs. This may cause the skin on your feet to become thinner, break easier, and heal more slowly. Your skin may become dry, and the skin may peel and crack. You may also have nerve damage in your legs and feet causing decreased feeling in them. You may not notice minor injuries to your feet that could lead to infections or more serious problems. Taking care of your feet is one of the most important things you can do for yourself.  HOME CARE INSTRUCTIONS  Wear shoes at all times, even in the house. Do not go barefoot. Bare feet are easily injured.  Check your feet daily for blisters, cuts, and redness. If you cannot see the bottom of your feet, use a mirror or ask someone for help.  Wash your feet with warm water (do not use hot water) and mild soap. Then pat your feet and the areas between your toes until they are completely dry. Do not soak your feet as this can dry your skin.  Apply a moisturizing lotion or petroleum jelly (that does not contain alcohol and is unscented) to the skin on your feet and to dry, brittle toenails. Do not apply lotion between your toes.  Trim your toenails straight across. Do not dig under them or around the cuticle. File the edges of your nails with an emery board or nail file.  Do not cut corns or calluses or try to remove them with medicine.  Wear clean socks or stockings every day. Make sure they are not too tight. Do not wear knee-high stockings since they may decrease blood flow to your legs.  Wear shoes that fit properly and have enough cushioning. To break in new shoes, wear them for just a few hours a day. This prevents you from injuring your feet. Always look in your shoes before you put them on to be sure there are no objects inside.  Do not cross your legs. This may decrease the blood flow to your feet.  If you find a minor scrape,  cut, or break in the skin on your feet, keep it and the skin around it clean and dry. These areas may be cleansed with mild soap and water. Do not cleanse the area with peroxide, alcohol, or iodine.  When you remove an adhesive bandage, be sure not to damage the skin around it.  If you have a wound, look at it several times a day to make sure it is healing.  Do not use heating pads or hot water bottles. They may burn your skin. If you have lost feeling in your feet or legs, you may not know it is happening until it is too late.  Make sure your health care provider performs a complete foot exam at least annually or more often if you have foot problems. Report any cuts, sores, or bruises to your health care provider immediately. SEEK MEDICAL CARE IF:   You have an injury that is not healing.  You have cuts or breaks in the skin.  You have an ingrown nail.  You notice redness on your legs or feet.  You feel burning or tingling in your legs or feet.  You have pain or cramps in your legs and feet.  Your legs or feet are numb.  Your feet always feel cold. SEEK IMMEDIATE MEDICAL CARE IF:   There is increasing redness,   swelling, or pain in or around a wound.  There is a red line that goes up your leg.  Pus is coming from a wound.  You develop a fever or as directed by your health care provider.  You notice a bad smell coming from an ulcer or wound. Document Released: 09/10/2000 Document Revised: 05/16/2013 Document Reviewed: 02/20/2013 Exit[employer] Patient Information 2015 [employer]. This information is not intended to replace advice given to you by your health care provider. Make sure you discuss any questions you have with your health care provider.  

## 2014-05-24 ENCOUNTER — Other Ambulatory Visit: Payer: Self-pay | Admitting: Cardiology

## 2014-06-05 ENCOUNTER — Other Ambulatory Visit: Payer: Self-pay

## 2014-06-05 MED ORDER — FUROSEMIDE 40 MG PO TABS: ORAL_TABLET | ORAL | Status: DC

## 2014-06-10 ENCOUNTER — Encounter (HOSPITAL_COMMUNITY): Payer: Self-pay | Admitting: Emergency Medicine

## 2014-06-10 ENCOUNTER — Emergency Department (HOSPITAL_COMMUNITY): Payer: Medicare Other

## 2014-06-10 ENCOUNTER — Observation Stay (HOSPITAL_COMMUNITY)
Admission: EM | Admit: 2014-06-10 | Discharge: 2014-06-12 | Disposition: A | Discharge status: Home / Self Care | Payer: Medicare Other | Attending: Internal Medicine | Admitting: Internal Medicine

## 2014-06-10 DIAGNOSIS — Z7902 Long term (current) use of antithrombotics/antiplatelets: Secondary | ICD-10-CM | POA: Insufficient documentation

## 2014-06-10 DIAGNOSIS — R0989 Other specified symptoms and signs involving the circulatory and respiratory systems: Secondary | ICD-10-CM | POA: Diagnosis not present

## 2014-06-10 DIAGNOSIS — D126 Benign neoplasm of colon, unspecified: Secondary | ICD-10-CM | POA: Diagnosis not present

## 2014-06-10 DIAGNOSIS — K219 Gastro-esophageal reflux disease without esophagitis: Secondary | ICD-10-CM | POA: Insufficient documentation

## 2014-06-10 DIAGNOSIS — G40909 Epilepsy, unspecified, not intractable, without status epilepticus: Secondary | ICD-10-CM | POA: Insufficient documentation

## 2014-06-10 DIAGNOSIS — K922 Gastrointestinal hemorrhage, unspecified: Secondary | ICD-10-CM | POA: Insufficient documentation

## 2014-06-10 DIAGNOSIS — Z951 Presence of aortocoronary bypass graft: Secondary | ICD-10-CM | POA: Insufficient documentation

## 2014-06-10 DIAGNOSIS — I2510 Atherosclerotic heart disease of native coronary artery without angina pectoris: Secondary | ICD-10-CM | POA: Insufficient documentation

## 2014-06-10 DIAGNOSIS — I209 Angina pectoris, unspecified: Secondary | ICD-10-CM | POA: Diagnosis not present

## 2014-06-10 DIAGNOSIS — I5032 Chronic diastolic (congestive) heart failure: Secondary | ICD-10-CM | POA: Insufficient documentation

## 2014-06-10 DIAGNOSIS — R110 Nausea: Secondary | ICD-10-CM | POA: Diagnosis not present

## 2014-06-10 DIAGNOSIS — I5033 Acute on chronic diastolic (congestive) heart failure: Secondary | ICD-10-CM

## 2014-06-10 DIAGNOSIS — H409 Unspecified glaucoma: Secondary | ICD-10-CM | POA: Diagnosis not present

## 2014-06-10 DIAGNOSIS — Z9849 Cataract extraction status, unspecified eye: Secondary | ICD-10-CM | POA: Insufficient documentation

## 2014-06-10 DIAGNOSIS — Z6830 Body mass index (BMI) 30.0-30.9, adult: Secondary | ICD-10-CM | POA: Insufficient documentation

## 2014-06-10 DIAGNOSIS — E7800 Pure hypercholesterolemia, unspecified: Secondary | ICD-10-CM

## 2014-06-10 DIAGNOSIS — E119 Type 2 diabetes mellitus without complications: Secondary | ICD-10-CM | POA: Insufficient documentation

## 2014-06-10 DIAGNOSIS — D131 Benign neoplasm of stomach: Secondary | ICD-10-CM | POA: Diagnosis not present

## 2014-06-10 DIAGNOSIS — R0609 Other forms of dyspnea: Secondary | ICD-10-CM | POA: Diagnosis not present

## 2014-06-10 DIAGNOSIS — Z9089 Acquired absence of other organs: Secondary | ICD-10-CM | POA: Diagnosis not present

## 2014-06-10 DIAGNOSIS — I25810 Atherosclerosis of coronary artery bypass graft(s) without angina pectoris: Secondary | ICD-10-CM

## 2014-06-10 DIAGNOSIS — A048 Other specified bacterial intestinal infections: Secondary | ICD-10-CM | POA: Insufficient documentation

## 2014-06-10 DIAGNOSIS — R42 Dizziness and giddiness: Secondary | ICD-10-CM | POA: Diagnosis not present

## 2014-06-10 DIAGNOSIS — Z79899 Other long term (current) drug therapy: Secondary | ICD-10-CM | POA: Diagnosis not present

## 2014-06-10 DIAGNOSIS — I6529 Occlusion and stenosis of unspecified carotid artery: Secondary | ICD-10-CM | POA: Insufficient documentation

## 2014-06-10 DIAGNOSIS — E039 Hypothyroidism, unspecified: Secondary | ICD-10-CM | POA: Insufficient documentation

## 2014-06-10 DIAGNOSIS — D649 Anemia, unspecified: Secondary | ICD-10-CM | POA: Diagnosis not present

## 2014-06-10 DIAGNOSIS — I10 Essential (primary) hypertension: Secondary | ICD-10-CM | POA: Diagnosis not present

## 2014-06-10 DIAGNOSIS — I447 Left bundle-branch block, unspecified: Secondary | ICD-10-CM | POA: Insufficient documentation

## 2014-06-10 DIAGNOSIS — D509 Iron deficiency anemia, unspecified: Secondary | ICD-10-CM | POA: Diagnosis present

## 2014-06-10 DIAGNOSIS — R0789 Other chest pain: Secondary | ICD-10-CM

## 2014-06-10 DIAGNOSIS — E669 Obesity, unspecified: Secondary | ICD-10-CM | POA: Insufficient documentation

## 2014-06-10 DIAGNOSIS — R609 Edema, unspecified: Secondary | ICD-10-CM | POA: Insufficient documentation

## 2014-06-10 DIAGNOSIS — R011 Cardiac murmur, unspecified: Secondary | ICD-10-CM

## 2014-06-10 DIAGNOSIS — R0602 Shortness of breath: Secondary | ICD-10-CM | POA: Diagnosis not present

## 2014-06-10 DIAGNOSIS — I739 Peripheral vascular disease, unspecified: Secondary | ICD-10-CM | POA: Insufficient documentation

## 2014-06-10 DIAGNOSIS — I509 Heart failure, unspecified: Secondary | ICD-10-CM | POA: Diagnosis not present

## 2014-06-10 DIAGNOSIS — I658 Occlusion and stenosis of other precerebral arteries: Secondary | ICD-10-CM | POA: Diagnosis not present

## 2014-06-10 DIAGNOSIS — E876 Hypokalemia: Secondary | ICD-10-CM

## 2014-06-10 DIAGNOSIS — Z7982 Long term (current) use of aspirin: Secondary | ICD-10-CM | POA: Diagnosis not present

## 2014-06-10 DIAGNOSIS — R072 Precordial pain: Secondary | ICD-10-CM | POA: Diagnosis not present

## 2014-06-10 HISTORY — DX: Sickle-cell trait: D57.3

## 2014-06-10 HISTORY — DX: Pure hypercholesterolemia, unspecified: E78.00

## 2014-06-10 HISTORY — DX: Unspecified osteoarthritis, unspecified site: M19.90

## 2014-06-10 HISTORY — DX: Type 2 diabetes mellitus without complications: E11.9

## 2014-06-10 LAB — URINALYSIS, ROUTINE W REFLEX MICROSCOPIC
BILIRUBIN URINE: NEGATIVE
Glucose, UA: NEGATIVE mg/dL
HGB URINE DIPSTICK: NEGATIVE
KETONES UR: NEGATIVE mg/dL
Nitrite: NEGATIVE
PROTEIN: NEGATIVE mg/dL
Specific Gravity, Urine: 1.015 (ref 1.005–1.030)
UROBILINOGEN UA: 0.2 mg/dL (ref 0.0–1.0)
pH: 5.5 (ref 5.0–8.0)

## 2014-06-10 LAB — COMPREHENSIVE METABOLIC PANEL
ALBUMIN: 3.4 g/dL — AB (ref 3.5–5.2)
ALT: 11 U/L (ref 0–35)
ANION GAP: 15 (ref 5–15)
AST: 18 U/L (ref 0–37)
Alkaline Phosphatase: 92 U/L (ref 39–117)
BILIRUBIN TOTAL: 0.3 mg/dL (ref 0.3–1.2)
BUN: 10 mg/dL (ref 6–23)
CHLORIDE: 101 meq/L (ref 96–112)
CO2: 23 mEq/L (ref 19–32)
CREATININE: 0.87 mg/dL (ref 0.50–1.10)
Calcium: 8.3 mg/dL — ABNORMAL LOW (ref 8.4–10.5)
GFR calc non Af Amer: 65 mL/min — ABNORMAL LOW (ref >90)
GFR, EST AFRICAN AMERICAN: 75 mL/min — AB (ref >90)
Glucose, Bld: 121 mg/dL — ABNORMAL HIGH (ref 70–99)
Potassium: 3.5 mEq/L — ABNORMAL LOW (ref 3.7–5.3)
Sodium: 139 mEq/L (ref 137–147)
Total Protein: 7.1 g/dL (ref 6.0–8.3)

## 2014-06-10 LAB — CBC
HCT: 23.6 % — ABNORMAL LOW (ref 36.0–46.0)
Hemoglobin: 7 g/dL — ABNORMAL LOW (ref 12.0–15.0)
MCH: 20.9 pg — ABNORMAL LOW (ref 26.0–34.0)
MCHC: 29.7 g/dL — ABNORMAL LOW (ref 30.0–36.0)
MCV: 70.4 fL — AB (ref 78.0–100.0)
Platelets: 267 10*3/uL (ref 150–400)
RBC: 3.35 MIL/uL — ABNORMAL LOW (ref 3.87–5.11)
RDW: 18.3 % — AB (ref 11.5–15.5)
WBC: 6.4 10*3/uL (ref 4.0–10.5)

## 2014-06-10 LAB — PROTIME-INR
INR: 1.22 (ref 0.00–1.49)
PROTHROMBIN TIME: 15.4 s — AB (ref 11.6–15.2)

## 2014-06-10 LAB — RETICULOCYTES
RBC.: 3.21 MIL/uL — ABNORMAL LOW (ref 3.87–5.11)
RETIC COUNT ABSOLUTE: 44.9 10*3/uL (ref 19.0–186.0)
Retic Ct Pct: 1.4 % (ref 0.4–3.1)

## 2014-06-10 LAB — URINE MICROSCOPIC-ADD ON

## 2014-06-10 LAB — TROPONIN I

## 2014-06-10 LAB — PRO B NATRIURETIC PEPTIDE: Pro B Natriuretic peptide (BNP): 858.6 pg/mL — ABNORMAL HIGH (ref 0–125)

## 2014-06-10 LAB — ABO/RH: ABO/RH(D): A POS

## 2014-06-10 MED ORDER — PHENYTOIN SODIUM EXTENDED 100 MG PO CAPS
100.0000 mg | ORAL_CAPSULE | Freq: Every day | ORAL | Status: DC
  Filled 2014-06-10: qty 1

## 2014-06-10 MED ORDER — ATORVASTATIN CALCIUM 80 MG PO TABS
80.0000 mg | ORAL_TABLET | Freq: Every day | ORAL | Status: DC
  Administered 2014-06-11 – 2014-06-12 (×2): 80 mg via ORAL
  Filled 2014-06-10 (×2): qty 1

## 2014-06-10 MED ORDER — POTASSIUM CHLORIDE CRYS ER 20 MEQ PO TBCR
20.0000 meq | EXTENDED_RELEASE_TABLET | Freq: Every day | ORAL | Status: DC
  Administered 2014-06-11: 20 meq via ORAL
  Filled 2014-06-10 (×2): qty 1

## 2014-06-10 MED ORDER — ISOSORBIDE MONONITRATE ER 60 MG PO TB24
120.0000 mg | ORAL_TABLET | Freq: Two times a day (BID) | ORAL | Status: DC
  Administered 2014-06-11 (×2): 120 mg via ORAL
  Filled 2014-06-10 (×5): qty 2

## 2014-06-10 MED ORDER — BRIMONIDINE TARTRATE-TIMOLOL 0.2-0.5 % OP SOLN: 1.0000 [drp] | Freq: Every day | OPHTHALMIC | Status: DC

## 2014-06-10 MED ORDER — METFORMIN HCL ER 500 MG PO TB24
500.0000 mg | ORAL_TABLET | Freq: Every day | ORAL | Status: DC
  Administered 2014-06-11: 500 mg via ORAL
  Filled 2014-06-10 (×2): qty 1

## 2014-06-10 MED ORDER — PHENYTOIN SODIUM EXTENDED 100 MG PO CAPS
100.0000 mg | ORAL_CAPSULE | Freq: Two times a day (BID) | ORAL | Status: DC
  Filled 2014-06-10: qty 2

## 2014-06-10 MED ORDER — TIMOLOL MALEATE 0.5 % OP SOLN
1.0000 [drp] | Freq: Every day | OPHTHALMIC | Status: DC
  Administered 2014-06-11 – 2014-06-12 (×2): 1 [drp] via OPHTHALMIC
  Filled 2014-06-10: qty 5

## 2014-06-10 MED ORDER — INFLUENZA VAC SPLIT QUAD 0.5 ML IM SUSY
0.5000 mL | PREFILLED_SYRINGE | INTRAMUSCULAR | Status: AC
  Administered 2014-06-11: 0.5 mL via INTRAMUSCULAR
  Filled 2014-06-10: qty 0.5

## 2014-06-10 MED ORDER — CARVEDILOL 6.25 MG PO TABS
6.2500 mg | ORAL_TABLET | Freq: Two times a day (BID) | ORAL | Status: DC
  Administered 2014-06-11: 6.25 mg via ORAL
  Filled 2014-06-10 (×5): qty 1

## 2014-06-10 MED ORDER — PHENYTOIN SODIUM EXTENDED 100 MG PO CAPS
200.0000 mg | ORAL_CAPSULE | Freq: Every day | ORAL | Status: DC
  Administered 2014-06-11 – 2014-06-12 (×2): 200 mg via ORAL
  Filled 2014-06-10 (×2): qty 2

## 2014-06-10 MED ORDER — PHENYTOIN SODIUM EXTENDED 100 MG PO CAPS: 200.0000 mg | ORAL_CAPSULE | Freq: Every day | ORAL | Status: DC

## 2014-06-10 MED ORDER — PANTOPRAZOLE SODIUM 40 MG PO TBEC
40.0000 mg | DELAYED_RELEASE_TABLET | Freq: Every day | ORAL | Status: DC
  Administered 2014-06-11 – 2014-06-12 (×2): 40 mg via ORAL
  Filled 2014-06-10 (×3): qty 1

## 2014-06-10 MED ORDER — ASPIRIN EC 81 MG PO TBEC
81.0000 mg | DELAYED_RELEASE_TABLET | Freq: Every day | ORAL | Status: DC
  Administered 2014-06-11 – 2014-06-12 (×2): 81 mg via ORAL
  Filled 2014-06-10 (×2): qty 1

## 2014-06-10 MED ORDER — PHENYTOIN SODIUM EXTENDED 100 MG PO CAPS
100.0000 mg | ORAL_CAPSULE | Freq: Every day | ORAL | Status: DC
  Administered 2014-06-10 – 2014-06-11 (×2): 100 mg via ORAL
  Filled 2014-06-10 (×3): qty 1

## 2014-06-10 MED ORDER — CLOPIDOGREL BISULFATE 75 MG PO TABS
75.0000 mg | ORAL_TABLET | Freq: Every day | ORAL | Status: DC
  Filled 2014-06-10: qty 1

## 2014-06-10 MED ORDER — BRIMONIDINE TARTRATE 0.2 % OP SOLN
1.0000 [drp] | Freq: Every day | OPHTHALMIC | Status: DC
  Administered 2014-06-11: 1 [drp] via OPHTHALMIC
  Filled 2014-06-10: qty 5

## 2014-06-10 MED ORDER — FUROSEMIDE 40 MG PO TABS
40.0000 mg | ORAL_TABLET | Freq: Every day | ORAL | Status: DC
  Administered 2014-06-10: 40 mg via ORAL
  Filled 2014-06-10 (×2): qty 1

## 2014-06-10 MED ORDER — LOTEPREDNOL ETABONATE 0.5 % OP SUSP
1.0000 [drp] | Freq: Every day | OPHTHALMIC | Status: DC
  Administered 2014-06-11 – 2014-06-12 (×2): 1 [drp] via OPHTHALMIC
  Filled 2014-06-10: qty 5

## 2014-06-10 MED ORDER — LEVOTHYROXINE SODIUM 100 MCG PO TABS
100.0000 ug | ORAL_TABLET | Freq: Every day | ORAL | Status: DC
  Administered 2014-06-11: 100 ug via ORAL
  Filled 2014-06-10 (×3): qty 1

## 2014-06-10 MED ORDER — POTASSIUM CHLORIDE CRYS ER 10 MEQ PO TBCR
10.0000 meq | EXTENDED_RELEASE_TABLET | Freq: Every day | ORAL | Status: DC
  Administered 2014-06-10 – 2014-06-11 (×2): 10 meq via ORAL
  Filled 2014-06-10 (×3): qty 1

## 2014-06-10 MED ORDER — ASPIRIN 81 MG PO TABS: 81.0000 mg | ORAL_TABLET | Freq: Every day | ORAL | Status: DC

## 2014-06-10 MED ORDER — AMLODIPINE BESYLATE 5 MG PO TABS
5.0000 mg | ORAL_TABLET | Freq: Every day | ORAL | Status: DC
  Administered 2014-06-11: 5 mg via ORAL
  Filled 2014-06-10 (×2): qty 1

## 2014-06-10 MED ORDER — HEPARIN SODIUM (PORCINE) 5000 UNIT/ML IJ SOLN
5000.0000 [IU] | Freq: Three times a day (TID) | INTRAMUSCULAR | Status: DC
  Administered 2014-06-10 – 2014-06-11 (×4): 5000 [IU] via SUBCUTANEOUS
  Filled 2014-06-10 (×8): qty 1

## 2014-06-10 MED ORDER — IBANDRONATE SODIUM 150 MG PO TABS: 150.0000 mg | ORAL_TABLET | ORAL | Status: DC

## 2014-06-10 MED ORDER — POTASSIUM CHLORIDE CRYS ER 20 MEQ PO TBCR: 10.0000 meq | EXTENDED_RELEASE_TABLET | Freq: Every day | ORAL | Status: DC

## 2014-06-10 MED ORDER — NITROGLYCERIN 0.4 MG SL SUBL: 0.4000 mg | SUBLINGUAL_TABLET | SUBLINGUAL | Status: DC | PRN

## 2014-06-10 NOTE — ED Notes (Signed)
EDP at Pt bedside.

## 2014-06-10 NOTE — Consult Note (Signed)
CARDIOLOGY CONSULT NOTE  Patient ID: Christine Gay, MRN: [id number], DOB/AGE: 73-May-1942 73 y.o. Admit date: 06/10/2014 Date of Consult: 06/10/2014  Primary Physician: Kandice Hams, MD Primary Cardiologist: Dr. Aundra Dubin  Chief Complaint: chest pain, SOB Reason for Consultation: assistance with management of cardiac issues  HPI: 73 y.o. female w/ PMHx significant for CAD s/p CABG 1992, PCI to LCX 2014, DM2, HFpEF, PVD with mod bil carotid stenosis who presented to [facility] on 06/10/2014 with complaints of 2-3 weeks of dyspnea on exertion, weight gain and chest pressure.  She was last seen in cardiology clinc 02/2013 after undergoing MPI which was low risk, negative for ischemia (EF54%). SHe reports feeling well at that time.  However, over the last 2-3 weeks, she has noted a decrease in her exercise tolerance, getting short of breath with minimal exercise and getting a tightness in her chest with exertion. Also has been craving salted foods and ice chips. Reports weight gain of > 10 lbs with increase in lower ext edema; though it improves with propping up her legs. Also, previously did not use nitro but over the last 2-3 weeks, has used nitro twice.  Reports compliance with her medications including aspirin and plavix. Denies bleeding in urine or bowels. Has screening colonoscopy many years ago which she said was fine.   Past Medical History  Diagnosis Date  . CAD (coronary artery disease)     a. s/p CABG 1992, b. s/p PCI several years ago; c. LHC 11/08: L-LAD ok, S-OM1 ok, S-OM2 CTO, prox to mid CFX 70% (unchanged), RCA non-dominant, occluded;  d.  Echo 2/11: EF 55%, mod LVH, Ao sclerosis;  e. Echo 6/14: mild LVH, mild focal basal sept hypertrophy, EF 55-65%, inf HK, Mild AI, mild MR, mild BAE;  f. Myoview 7/14: int risk-> cath 04/2013 s/p PTCA/DES to prox Cx 05/04/13.  . Obesity   . Seizure disorder   . GERD (gastroesophageal reflux disease)   . LBBB (left bundle branch block)    chronic  . Glaucoma   . Hypothyroidism   . Hypertension   . Diabetes mellitus   . Anemia     a. Noted on labs 04/2013.  Marland Kitchen Chronic diastolic CHF (congestive heart failure)       Surgical History:  Past Surgical History  Procedure Laterality Date  . Coronary artery bypass graft    . Cataract extraction    . Cholecystectomy    . Tubal ligation Bilateral   . Coronary angioplasty with stent placement  05/04/2013    DES  to [place]: Prior to Admission medications   Medication Sig Start Date End Date Taking? Authorizing Provider  amLODipine (NORVASC) 5 MG tablet Take 5 mg by mouth daily.   Yes Historical Provider, MD  aspirin 81 MG tablet Take 81 mg by mouth daily.   Yes Historical Provider, MD  atorvastatin (LIPITOR) 80 MG tablet Take 80 mg by mouth daily.   Yes Historical Provider, MD  brimonidine-timolol (COMBIGAN) 0.2-0.5 % ophthalmic solution Place 1 drop into both eyes daily.    Yes Historical Provider, MD  carvedilol (COREG) 6.25 MG tablet Take 1 tablet (6.25 mg total) by mouth 2 (two) times daily. 02/15/14  Yes Larey Dresser, MD  clopidogrel (PLAVIX) 75 MG tablet Take 75 mg by mouth daily.   Yes Historical Provider, MD  furosemide (LASIX) 40 MG tablet Take 40 mg by mouth at bedtime. 06/05/14  Yes Larey Dresser, MD  ibandronate Jaclyn Prime)  150 MG tablet Take 150 mg by mouth every 30 (thirty) days. Take in the morning with a full glass of water, on an empty stomach, and do not take anything else by mouth or lie down for the next 30 min.   Yes Historical Provider, MD  isosorbide mononitrate (IMDUR) 60 MG 24 hr tablet Take 120 mg by mouth 2 (two) times daily. Patient states she takes two tablets twice a day   Yes Historical Provider, MD  levothyroxine (SYNTHROID, LEVOTHROID) 100 MCG tablet Take 100 mcg by mouth daily before breakfast.   Yes Historical Provider, MD  loteprednol (LOTEMAX) 0.5 % ophthalmic suspension Place 1 drop into both eyes daily.    Yes Historical  Provider, MD  metFORMIN (GLUCOPHAGE-XR) 500 MG 24 hr tablet Take 500 mg by mouth daily. 05/08/13  Yes Dayna N Dunn, PA-C  nitroGLYCERIN (NITROSTAT) 0.4 MG SL tablet Place 1 tablet (0.4 mg total) under the tongue every 5 (five) minutes as needed for chest pain. 02/15/14  Yes Larey Dresser, MD  pantoprazole (PROTONIX) 40 MG tablet TAKE 1 TABLET BY MOUTH DAILY.   Yes Larey Dresser, MD  phenytoin (DILANTIN) 100 MG ER capsule Take 100-200 mg by mouth 2 (two) times daily. Takes 2 tablets in am and 1 tablet in pm   Yes Historical Provider, MD  potassium chloride SA (K-DUR,KLOR-CON) 20 MEQ tablet Take 10-20 mEq by mouth daily. Take a 20mg   tablet in the AM and 10mg  tablet in the evening   Yes Historical Provider, MD  prednisoLONE sodium phosphate (INFLAMASE FORTE) 1 % ophthalmic solution Place 1 drop into both eyes every other day. Every other day   Yes Historical Provider, MD    Inpatient Medications:  . amLODipine  5 mg Oral Daily  . aspirin  81 mg Oral Daily  . atorvastatin  80 mg Oral Daily  . brimonidine-timolol  1 drop Both Eyes Daily  . carvedilol  6.25 mg Oral BID  . clopidogrel  75 mg Oral Daily  . furosemide  40 mg Oral QHS  . heparin  5,000 Units Subcutaneous 3 times per day  . ibandronate  150 mg Oral Q30 days  . isosorbide mononitrate  120 mg Oral BID  . [START ON 06/11/2014] levothyroxine  100 mcg Oral QAC breakfast  . loteprednol  1 drop Both Eyes Daily  . metFORMIN  500 mg Oral Daily  . pantoprazole  40 mg Oral Daily  . phenytoin  100-200 mg Oral BID  . potassium chloride SA  10-20 mEq Oral Daily      Allergies:  Allergies  Allergen Reactions  . Meperidine Hcl Other (See Comments)    Demerol - mental status changes    . Nitroglycerin Other (See Comments)    Patch and Cream only - blisters  . Morphine Palpitations    History   Social History  . Marital Status: Married    Spouse Name: N/A    Number of Children: N/A  . Years of Education: N/A   Occupational  History  . Not on file.   Social History Main Topics  . Smoking status: Never Smoker   . Smokeless tobacco: Never Used  . Alcohol Use: No  . Drug Use: No  . Sexual Activity: Not on file   Other Topics Concern  . Not on file   Social History Narrative  . No narrative on file     Family History  Problem Relation Age of Onset  . Diabetes Mother  Review of Systems:* General: negative for chills, fever, night sweats  Cardiovascular: see HPI Dermatological: negative for rash Respiratory: negative for cough or wheezing Urologic: negative for hematuria Abdominal: negative for nausea, vomiting, diarrhea, bright red blood per rectum, melena, or hematemesis Neurologic: negative for visual changes, syncope, or dizziness All other systems reviewed and are otherwise negative except as noted above.  Labs:  Recent Labs  06/10/14 1730  TROPONINI <0.30   Lab Results  Component Value Date   WBC 6.4 06/10/2014   HGB 7.0* 06/10/2014   HCT 23.6* 06/10/2014   MCV 70.4* 06/10/2014   PLT 267 06/10/2014    Recent Labs Lab 06/10/14 1443  NA 139  K 3.5*  CL 101  CO2 23  BUN 10  CREATININE 0.87  CALCIUM 8.3*  PROT 7.1  BILITOT 0.3  ALKPHOS 92  ALT 11  AST 18  GLUCOSE 121*   Lab Results  Component Value Date   CHOL 161 03/13/2013   HDL 69.30 03/13/2013   LDLCALC 80 03/13/2013   TRIG 61.0 03/13/2013    Radiology/Studies:  Dg Chest 2 View  06/10/2014   CLINICAL DATA:  Midsternal chest pain dizziness and nausea with shortness of breath. History of previous CABG as well as angioplasty and stent placement  EXAM: CHEST  2 VIEW  COMPARISON:  PA and lateral chest of January 22, 2013  FINDINGS: The lungs are well-expanded and clear. The cardiopericardial silhouette is enlarged but stable. The pulmonary vascularity is not engorged. There are 6 intact sternal wires present. There is mild tortuosity of the descending thoracic aorta. There are loops of mildly distended fluid and gas-filled  bowel in the upper abdomen. The observed portions of the bony thorax exhibit no acute abnormalities. There is sclerosis of portions of the left humeral head.  IMPRESSION: Stable cardiomegaly without evidence of CHF. There is no evidence of pneumonia.   Electronically Signed   By: David  Martinique   On: 06/10/2014 15:21    EKG: sinus with LBBB, unchanged  Physical Exam: Blood pressure 129/56, pulse 66, temperature 99.6 F (37.6 C), temperature source Oral, resp. rate 15, SpO2 100.00%. General: Well developed, well nourished, in no acute distress. Head: Normocephalic, atraumatic, sclera non-icteric, no xanthomas, nares are without discharge.  Neck: Supple. Negative for carotid bruits. JVD not seen at 70 deg Lungs: Clear bilaterally to auscultation without wheezes, rales, or rhonchi. Breathing is unlabored. Heart: RRR with S1 S2. No murmurs, rubs, or gallops appreciated. Abdomen: Soft, non-tender, non-distended with normoactive bowel sounds. No hepatomegaly. No rebound/guarding. No obvious abdominal masses. Msk:  Strength and tone appear normal for age. Extremities: No clubbing or cyanosis.+1 edema. Distal pedal pulses are 2+ and equal bilaterally. Neuro: Alert and oriented X 3. Moves all extremities spontaneously. Psych:  Responds to questions appropriately with a normal affect.   Problem List 1. Progressive angina in the setting of worsening anemia 2. Anemia 3. HFpEF, mildly decompensated likely due to anemia and dietary noncompliance. 4. CAD s/p CABG, PCI 5. PVD, bilat carotid stenosis 40-59% 6. DM2 7. HTN  Assessment and Plan:  73 y.o. female w/ PMHx significant for CAD s/p CABG 1992, PCI to LCX 2014, DM2, HFpEF, PVD with mod bil carotid stenosis who presented to [facility] on 06/10/2014 with complaints of 2-3 weeks of dyspnea on exertion, weight gain and chest pressure --> found to  Be anemic with Hct of 24%.  Cause of her increase symptoms is likely directly related to her  worsening anemia in a  2 fold manner. First, decreased oxygen carrying capacity in the setting of known CAD is likely producing increased angina from fixed stenosis leading to supply demand issues. Treatment for this is improvement of her anemia. Also, anemia is a powerful precipitant of fluid retention and she also admits to dietary noncompliance leading to mild fluid overload which is also contributing to her symptoms. Fluid restriction and continuation of lasix is recommended. Negative -1 l/day for a couple of days is a good goal.  Anemia appears to be microcytic though hemocult neg. Would continue aspirin and plavix for now (though she is one year out from the DES so if necessary and source determined, this can be stopped). Appreciate hospitalists assistance with working this up.  Thank you for this consult. Please do not hesitate to call with questions.  Signed, Elias Else, Mila Homer MD 06/10/2014, 8:21 PM

## 2014-06-10 NOTE — ED Notes (Signed)
Cp that started last night took a nitro last night has been dizzy and nauseated

## 2014-06-10 NOTE — ED Notes (Signed)
Pt reports pain and tightness in chest. Pt reports chills, dizziness and nausea x 7.

## 2014-06-10 NOTE — H&P (Signed)
[facility] History and Physical  Christine Gay YCX:[id number] DOB: 03-Aug-1941 DOA: 06/10/2014  Referring physician: EDP PCP: Kandice Hams, MD   Chief Complaint: Fatigue, chest heaviness   HPI: Christine Gay is a 73 y.o. female h/o CAD and CHF, presents to ED with insidious onset of fatigue, at least 1 or more weeks of chest heaviness and tightness.  Symptoms are associated with dizziness, nausea, and craving ice.  No melena, no BRBPR, no emesis, no other stigmata of GIB.  In ED found to be anemic with HGB 7.0, hemoccult negative.  Review of Systems: Systems reviewed.  As above, otherwise negative  Past Medical History  Diagnosis Date  . CAD (coronary artery disease)     a. s/p CABG 1992, b. s/p PCI several years ago; c. LHC 11/08: L-LAD ok, S-OM1 ok, S-OM2 CTO, prox to mid CFX 70% (unchanged), RCA non-dominant, occluded;  d.  Echo 2/11: EF 55%, mod LVH, Ao sclerosis;  e. Echo 6/14: mild LVH, mild focal basal sept hypertrophy, EF 55-65%, inf HK, Mild AI, mild MR, mild BAE;  f. Myoview 7/14: int risk-> cath 04/2013 s/p PTCA/DES to prox Cx 05/04/13.  . Obesity   . Seizure disorder   . GERD (gastroesophageal reflux disease)   . LBBB (left bundle branch block)     chronic  . Glaucoma   . Hypothyroidism   . Hypertension   . Diabetes mellitus   . Anemia     a. Noted on labs 04/2013.  Marland Kitchen Chronic diastolic CHF (congestive heart failure)    Past Surgical History  Procedure Laterality Date  . Coronary artery bypass graft    . Cataract extraction    . Cholecystectomy    . Tubal ligation Bilateral   . Coronary angioplasty with stent placement  05/04/2013    DES  to [place] History:  reports that she has never smoked. She has never used smokeless tobacco. She reports that she does not drink alcohol or use illicit drugs.  Allergies  Allergen Reactions  . Meperidine Hcl Other (See Comments)    Demerol - mental status changes    . Nitroglycerin Other (See Comments)     Patch and Cream only - blisters  . Morphine Palpitations    Family History  Problem Relation Age of Onset  . Diabetes Mother      Prior to Admission medications   Medication Sig Start Date End Date Taking? Authorizing Provider  amLODipine (NORVASC) 5 MG tablet Take 5 mg by mouth daily.   Yes Historical Provider, MD  aspirin 81 MG tablet Take 81 mg by mouth daily.   Yes Historical Provider, MD  atorvastatin (LIPITOR) 80 MG tablet Take 80 mg by mouth daily.   Yes Historical Provider, MD  brimonidine-timolol (COMBIGAN) 0.2-0.5 % ophthalmic solution Place 1 drop into both eyes daily.    Yes Historical Provider, MD  carvedilol (COREG) 6.25 MG tablet Take 1 tablet (6.25 mg total) by mouth 2 (two) times daily. 02/15/14  Yes Larey Dresser, MD  clopidogrel (PLAVIX) 75 MG tablet Take 75 mg by mouth daily.   Yes Historical Provider, MD  furosemide (LASIX) 40 MG tablet Take 40 mg by mouth at bedtime. 06/05/14  Yes Larey Dresser, MD  ibandronate (BONIVA) 150 MG tablet Take 150 mg by mouth every 30 (thirty) days. Take in the morning with a full glass of water, on an empty stomach, and do not take anything else by mouth or lie down for the  next 30 min.   Yes Historical Provider, MD  isosorbide mononitrate (IMDUR) 60 MG 24 hr tablet Take 120 mg by mouth 2 (two) times daily. Patient states she takes two tablets twice a day   Yes Historical Provider, MD  levothyroxine (SYNTHROID, LEVOTHROID) 100 MCG tablet Take 100 mcg by mouth daily before breakfast.   Yes Historical Provider, MD  loteprednol (LOTEMAX) 0.5 % ophthalmic suspension Place 1 drop into both eyes daily.    Yes Historical Provider, MD  metFORMIN (GLUCOPHAGE-XR) 500 MG 24 hr tablet Take 500 mg by mouth daily. 05/08/13  Yes Dayna N Dunn, PA-C  nitroGLYCERIN (NITROSTAT) 0.4 MG SL tablet Place 1 tablet (0.4 mg total) under the tongue every 5 (five) minutes as needed for chest pain. 02/15/14  Yes Larey Dresser, MD  pantoprazole (PROTONIX) 40 MG  tablet TAKE 1 TABLET BY MOUTH DAILY.   Yes Larey Dresser, MD  phenytoin (DILANTIN) 100 MG ER capsule Take 100-200 mg by mouth 2 (two) times daily. Takes 2 tablets in am and 1 tablet in pm   Yes Historical Provider, MD  potassium chloride SA (K-DUR,KLOR-CON) 20 MEQ tablet Take 10-20 mEq by mouth daily. Take a 20mg   tablet in the AM and 10mg  tablet in the evening   Yes Historical Provider, MD  prednisoLONE sodium phosphate (INFLAMASE FORTE) 1 % ophthalmic solution Place 1 drop into both eyes every other day. Every other day   Yes Historical Provider, MD   Physical Exam: Filed Vitals:   06/10/14 1945  BP: 131/76  Pulse: 61  Temp:   Resp: 14    BP 131/76  Pulse 61  Temp(Src) 99.6 F (37.6 C) (Oral)  Resp 14  SpO2 100%  General Appearance:    Alert, oriented, no distress, appears stated age  Head:    Normocephalic, atraumatic  Eyes:    PERRL, EOMI, sclera non-icteric        Nose:   Nares without drainage or epistaxis. Mucosa, turbinates normal  Throat:   Moist mucous membranes. Oropharynx without erythema or exudate.  Neck:   Supple. No carotid bruits.  No thyromegaly.  No lymphadenopathy.   Back:     No CVA tenderness, no spinal tenderness  Lungs:     Clear to auscultation bilaterally, without wheezes, rhonchi or rales  Chest wall:    No tenderness to palpitation  Heart:    Regular rate and rhythm without murmurs, gallops, rubs  Abdomen:     Soft, non-tender, nondistended, normal bowel sounds, no organomegaly  Genitalia:    deferred  Rectal:    deferred  Extremities:   No clubbing, cyanosis or edema.  Pulses:   2+ and symmetric all extremities  Skin:   Skin color, texture, turgor normal, no rashes or lesions  Lymph nodes:   Cervical, supraclavicular, and axillary nodes normal  Neurologic:   CNII-XII intact. Normal strength, sensation and reflexes      throughout    Labs on Admission:  Basic Metabolic Panel:  Recent Labs Lab 06/10/14 1443  NA 139  K 3.5*  CL 101  CO2  23  GLUCOSE 121*  BUN 10  CREATININE 0.87  CALCIUM 8.3*   Liver Function Tests:  Recent Labs Lab 06/10/14 1443  AST 18  ALT 11  ALKPHOS 92  BILITOT 0.3  PROT 7.1  ALBUMIN 3.4*   No results found for this basename: LIPASE, AMYLASE,  in the last 168 hours No results found for this basename: AMMONIA,  in the last  168 hours CBC:  Recent Labs Lab 06/10/14 1443  WBC 6.4  HGB 7.0*  HCT 23.6*  MCV 70.4*  PLT 267   Cardiac Enzymes:  Recent Labs Lab 06/10/14 1730  TROPONINI <0.30    BNP (last 3 results)  Recent Labs  08/13/13 0952 06/10/14 1730  PROBNP 88.0 858.6*   CBG: No results found for this basename: GLUCAP,  in the last 168 hours  Radiological Exams on Admission: Dg Chest 2 View  06/10/2014   CLINICAL DATA:  Midsternal chest pain dizziness and nausea with shortness of breath. History of previous CABG as well as angioplasty and stent placement  EXAM: CHEST  2 VIEW  COMPARISON:  PA and lateral chest of January 22, 2013  FINDINGS: The lungs are well-expanded and clear. The cardiopericardial silhouette is enlarged but stable. The pulmonary vascularity is not engorged. There are 6 intact sternal wires present. There is mild tortuosity of the descending thoracic aorta. There are loops of mildly distended fluid and gas-filled bowel in the upper abdomen. The observed portions of the bony thorax exhibit no acute abnormalities. There is sclerosis of portions of the left humeral head.  IMPRESSION: Stable cardiomegaly without evidence of CHF. There is no evidence of pneumonia.   Electronically Signed   By: David  Martinique   On: 06/10/2014 15:21    EKG: Independently reviewed.  Assessment/Plan Active Problems:   Microcytic hypochromic anemia   1. Microcytic hypochromic anemia - suspicious for iron deficiency anemia, anemia work up pending, admitting patient for observation.  Hemoccult negative so no reason to d/c her ASA / Plavix she chronically takes.  Not symptomatic in ED  so will hold off on transfusion for now.  Repeat CBC in AM.    Code Status: Full Code  Family Communication: Daughter at bedside Disposition Plan: Admit to obs   Time spent: 50 min  Caydance Kuehnle M. [facility] Pager [phone]  If 7AM-7PM, please contact the day team taking care of the patient [email or web address] Password [email or web address] 06/10/2014, 8:07 PM

## 2014-06-10 NOTE — Progress Notes (Signed)

## 2014-06-10 NOTE — ED Provider Notes (Signed)
CSN: [id number]     Arrival date & time 06/10/14  1421 History   First MD Initiated Contact with Patient 06/10/14 1724     Chief Complaint  Patient presents with  . Chest Pain     (Consider location/radiation/quality/duration/timing/severity/associated sxs/prior Treatment) Patient is a 73 y.o. female presenting with chest pain. The history is provided by the patient.  Chest Pain Pain location:  Substernal area Pain quality: tightness   Pain quality comment:  Heaviness Pain radiates to:  Does not radiate Pain radiates to the back: no   Pain severity:  Mild Onset quality:  Gradual Duration:  1 week Timing:  Intermittent Progression:  Worsening Chronicity:  New Context: at rest   Relieved by:  Nothing Worsened by:  Nothing tried Ineffective treatments:  None tried Associated symptoms: dizziness and shortness of breath   Associated symptoms: no abdominal pain, no back pain, no cough, no fatigue, no fever, no headache, no nausea and not vomiting   Shortness of breath:    Severity:  Mild   Onset quality:  Gradual   Duration:  1 month   Timing:  Intermittent   Progression:  Worsening   Past Medical History  Diagnosis Date  . CAD (coronary artery disease)     a. s/p CABG 1992, b. s/p PCI several years ago; c. LHC 11/08: L-LAD ok, S-OM1 ok, S-OM2 CTO, prox to mid CFX 70% (unchanged), RCA non-dominant, occluded;  d.  Echo 2/11: EF 55%, mod LVH, Ao sclerosis;  e. Echo 6/14: mild LVH, mild focal basal sept hypertrophy, EF 55-65%, inf HK, Mild AI, mild MR, mild BAE;  f. Myoview 7/14: int risk-> cath 04/2013 s/p PTCA/DES to prox Cx 05/04/13.  . Obesity   . Seizure disorder   . GERD (gastroesophageal reflux disease)   . LBBB (left bundle branch block)     chronic  . Glaucoma   . Hypothyroidism   . Hypertension   . Diabetes mellitus   . Anemia     a. Noted on labs 04/2013.  Marland Kitchen Chronic diastolic CHF (congestive heart failure)    Past Surgical History  Procedure Laterality Date  .  Coronary artery bypass graft    . Cataract extraction    . Cholecystectomy    . Tubal ligation Bilateral   . Coronary angioplasty with stent placement  05/04/2013    DES  to [place] History  Problem Relation Age of Onset  . Diabetes Mother    History  Substance Use Topics  . Smoking status: Never Smoker   . Smokeless tobacco: Never Used  . Alcohol Use: No   OB History   Grav Para Term Preterm Abortions TAB SAB Ect Mult Living                 Review of Systems  Constitutional: Positive for chills. Negative for fever and fatigue.  HENT: Negative for congestion and drooling.   Eyes: Negative for pain.  Respiratory: Positive for shortness of breath. Negative for cough.   Cardiovascular: Positive for chest pain.  Gastrointestinal: Negative for nausea, vomiting, abdominal pain and diarrhea.  Genitourinary: Negative for dysuria and hematuria.  Musculoskeletal: Negative for back pain, gait problem and neck pain.  Skin: Negative for color change.  Neurological: Positive for dizziness. Negative for headaches.  Hematological: Negative for adenopathy.  Psychiatric/Behavioral: Negative for behavioral problems.  All other systems reviewed and are negative.     Allergies  Meperidine hcl; Nitroglycerin; and Morphine  Home Medications  Prior to Admission medications   Medication Sig Start Date End Date Taking? Authorizing Provider  amLODipine (NORVASC) 5 MG tablet TAKE 1 TABLET BY MOUTH DAILY.    Larey Dresser, MD  aspirin 81 MG tablet Take 81 mg by mouth daily.    Historical Provider, MD  atorvastatin (LIPITOR) 80 MG tablet Take 80 mg by mouth daily.    Historical Provider, MD  brimonidine-timolol (COMBIGAN) 0.2-0.5 % ophthalmic solution Place 1 drop into both eyes daily.     Historical Provider, MD  carvedilol (COREG) 6.25 MG tablet Take 1 tablet (6.25 mg total) by mouth 2 (two) times daily. 02/15/14   Larey Dresser, MD  clopidogrel (PLAVIX) 75 MG tablet TAKE 1  TABLET BY MOUTH DAILY.    Larey Dresser, MD  furosemide (LASIX) 40 MG tablet TAKE 1 TABLET BY MOUTH EVERY MORNING AND 1/2 TABLET EVERY EVENING. 06/05/14   Larey Dresser, MD  ibandronate (BONIVA) 150 MG tablet Take 150 mg by mouth every 30 (thirty) days. Take in the morning with a full glass of water, on an empty stomach, and do not take anything else by mouth or lie down for the next 30 min.    Historical Provider, MD  isosorbide mononitrate (IMDUR) 60 MG 24 hr tablet TAKE 2 TABLETS BY MOUTH 2 TIMES DAILY.    Larey Dresser, MD  levothyroxine (SYNTHROID, LEVOTHROID) 100 MCG tablet Take 100 mcg by mouth daily before breakfast.    Historical Provider, MD  loteprednol (LOTEMAX) 0.5 % ophthalmic suspension Place 1 drop into both eyes daily.     Historical Provider, MD  metFORMIN (GLUCOPHAGE-XR) 500 MG 24 hr tablet Take 1 tablet (500 mg total) by mouth 2 (two) times daily. 05/08/13   Dayna N Dunn, PA-C  nitroGLYCERIN (NITROSTAT) 0.4 MG SL tablet Place 1 tablet (0.4 mg total) under the tongue every 5 (five) minutes as needed for chest pain. 02/15/14   Larey Dresser, MD  pantoprazole (PROTONIX) 40 MG tablet TAKE 1 TABLET BY MOUTH DAILY.    Larey Dresser, MD  phenytoin (DILANTIN) 100 MG ER capsule Take 100-200 mg by mouth 2 (two) times daily. Takes 2 tablets in am and 1 tablet in pm    Historical Provider, MD  potassium chloride SA (K-DUR,KLOR-CON) 20 MEQ tablet TAKE 1 TABLET BY MOUTH EVERY MORNING AND 1/2 TABLET EVERY EVENING.    Larey Dresser, MD  prednisoLONE sodium phosphate (INFLAMASE FORTE) 1 % ophthalmic solution Place 1 drop into both eyes every other day. Every other day    Historical Provider, MD   BP 123/59  Pulse 63  Temp(Src) 99.5 F (37.5 C) (Oral)  Resp 16  SpO2 99% Physical Exam  Nursing note and vitals reviewed. Constitutional: She is oriented to person, place, and time. She appears well-developed and well-nourished.  HENT:  Head: Normocephalic and atraumatic.  Mouth/Throat:  Oropharynx is clear and moist. No oropharyngeal exudate.  Eyes: Conjunctivae and EOM are normal. Pupils are equal, round, and reactive to light.  Neck: Normal range of motion. Neck supple.  Cardiovascular: Normal rate, regular rhythm, normal heart sounds and intact distal pulses.  Exam reveals no gallop and no friction rub.   No murmur heard. Pulmonary/Chest: Effort normal and breath sounds normal. No respiratory distress. She has no wheezes.  Abdominal: Soft. Bowel sounds are normal. There is no tenderness. There is no rebound and no guarding.  Genitourinary:  Normal appearing external rectum. No pain during the rectal exam. [place] appearing mucus.  No gross blood noted.  Musculoskeletal: Normal range of motion. She exhibits edema (trace edema in bilateral lower extremities.). She exhibits no tenderness.  Neurological: She is alert and oriented to person, place, and time.  alert, oriented x3 speech: normal in context and clarity memory: intact grossly cranial nerves II-XII: intact motor strength: full proximally and distally no involuntary movements or tremors sensation: intact to light touch diffusely  cerebellar: finger-to-nose and heel-to-shin intact gait: normal forwards and backwards  Skin: Skin is warm and dry.  Psychiatric: She has a normal mood and affect. Her behavior is normal.    ED Course  Procedures (including critical care time) Labs Review Labs Reviewed  CBC - Abnormal; Notable for the following:    RBC 3.35 (*)    Hemoglobin 7.0 (*)    HCT 23.6 (*)    MCV 70.4 (*)    MCH 20.9 (*)    MCHC 29.7 (*)    RDW 18.3 (*)    All other components within normal limits  COMPREHENSIVE METABOLIC PANEL - Abnormal; Notable for the following:    Potassium 3.5 (*)    Glucose, Bld 121 (*)    Calcium 8.3 (*)    Albumin 3.4 (*)    GFR calc non Af Amer 65 (*)    GFR calc Af Amer 75 (*)    All other components within normal limits  PROTIME-INR - Abnormal; Notable for the  following:    Prothrombin Time 15.4 (*)    All other components within normal limits  URINALYSIS, ROUTINE W REFLEX MICROSCOPIC  TROPONIN I  PRO B NATRIURETIC PEPTIDE    Imaging Review Dg Chest 2 View  06/10/2014   CLINICAL DATA:  Midsternal chest pain dizziness and nausea with shortness of breath. History of previous CABG as well as angioplasty and stent placement  EXAM: CHEST  2 VIEW  COMPARISON:  PA and lateral chest of January 22, 2013  FINDINGS: The lungs are well-expanded and clear. The cardiopericardial silhouette is enlarged but stable. The pulmonary vascularity is not engorged. There are 6 intact sternal wires present. There is mild tortuosity of the descending thoracic aorta. There are loops of mildly distended fluid and gas-filled bowel in the upper abdomen. The observed portions of the bony thorax exhibit no acute abnormalities. There is sclerosis of portions of the left humeral head.  IMPRESSION: Stable cardiomegaly without evidence of CHF. There is no evidence of pneumonia.   Electronically Signed   By: David  Martinique   On: 06/10/2014 15:21     EKG Interpretation   Date/Time:  Monday June 10 2014 14:33:29 EDT Ventricular Rate:  63 PR Interval:  162 QRS Duration: 130 QT Interval:  464 QTC Calculation: 474 R Axis:   10 Text Interpretation:  Normal sinus rhythm Left bundle branch block  Abnormal ECG No significant change since last tracing Confirmed by  Zani Kyllonen  MD, Malonie Tatum ([id number]) on 06/10/2014 5:30:27 PM      MDM   Final diagnoses:  Other chest pain  Anemia, unspecified    5:42 PM 73 y.o. female w hx of anemia, DM, HTN, LBBB, CHF, CAD s/p CABG in '92 and PCI who pw intermittent chest tightness becoming more frequent over the last week. She also notes worsening fatigue and shortness of breath with exertion. She is also had some chills and some intermittent dizzy spells. She states that she had chest pain earlier today but denies this currently on exam. She is afebrile  and vital signs are unremarkable here.  She has a normal neurologic exam. Screening labs and imaging performed prior to my evaluation. Troponin was not sent. Will add this on as well as a BNP and an occult blood stool card. She denies seeing any blood or darkness to her stools. Anemic w/ Hgb of 7.0 today, previous was 9.8 a year ago.   7:23 PM Hemeoccult neg per mini lab.   Will admit to hospitalist for anemia workup, consulted cards for eval d/t cp.   Pamella Pert, MD 06/10/14 2215

## 2014-06-11 ENCOUNTER — Encounter (HOSPITAL_COMMUNITY): Payer: Self-pay | Admitting: Student

## 2014-06-11 DIAGNOSIS — I5033 Acute on chronic diastolic (congestive) heart failure: Secondary | ICD-10-CM | POA: Diagnosis not present

## 2014-06-11 DIAGNOSIS — D126 Benign neoplasm of colon, unspecified: Secondary | ICD-10-CM | POA: Diagnosis not present

## 2014-06-11 DIAGNOSIS — H409 Unspecified glaucoma: Secondary | ICD-10-CM

## 2014-06-11 DIAGNOSIS — I509 Heart failure, unspecified: Secondary | ICD-10-CM

## 2014-06-11 DIAGNOSIS — D131 Benign neoplasm of stomach: Secondary | ICD-10-CM | POA: Diagnosis not present

## 2014-06-11 DIAGNOSIS — I25810 Atherosclerosis of coronary artery bypass graft(s) without angina pectoris: Secondary | ICD-10-CM | POA: Diagnosis not present

## 2014-06-11 DIAGNOSIS — D509 Iron deficiency anemia, unspecified: Secondary | ICD-10-CM

## 2014-06-11 DIAGNOSIS — E119 Type 2 diabetes mellitus without complications: Secondary | ICD-10-CM

## 2014-06-11 DIAGNOSIS — K219 Gastro-esophageal reflux disease without esophagitis: Secondary | ICD-10-CM

## 2014-06-11 LAB — CBC
HCT: 29.1 % — ABNORMAL LOW (ref 36.0–46.0)
HEMATOCRIT: 24.2 % — AB (ref 36.0–46.0)
Hemoglobin: 7.3 g/dL — ABNORMAL LOW (ref 12.0–15.0)
Hemoglobin: 8.9 g/dL — ABNORMAL LOW (ref 12.0–15.0)
MCH: 21.2 pg — ABNORMAL LOW (ref 26.0–34.0)
MCH: 22 pg — ABNORMAL LOW (ref 26.0–34.0)
MCHC: 30.2 g/dL (ref 30.0–36.0)
MCHC: 30.6 g/dL (ref 30.0–36.0)
MCV: 70.3 fL — ABNORMAL LOW (ref 78.0–100.0)
MCV: 71.9 fL — ABNORMAL LOW (ref 78.0–100.0)
PLATELETS: 239 10*3/uL (ref 150–400)
Platelets: 245 10*3/uL (ref 150–400)
RBC: 3.44 MIL/uL — ABNORMAL LOW (ref 3.87–5.11)
RBC: 4.05 MIL/uL (ref 3.87–5.11)
RDW: 18.4 % — AB (ref 11.5–15.5)
RDW: 18.2 % — ABNORMAL HIGH (ref 11.5–15.5)
WBC: 4.5 10*3/uL (ref 4.0–10.5)
WBC: 6.3 10*3/uL (ref 4.0–10.5)

## 2014-06-11 LAB — IRON AND TIBC: UIBC: 333 ug/dL (ref 125–400)

## 2014-06-11 LAB — FERRITIN: Ferritin: 8 ng/mL — ABNORMAL LOW (ref 10–291)

## 2014-06-11 LAB — GLUCOSE, CAPILLARY
GLUCOSE-CAPILLARY: 123 mg/dL — AB (ref 70–99)
GLUCOSE-CAPILLARY: 118 mg/dL — AB (ref 70–99)
GLUCOSE-CAPILLARY: 98 mg/dL (ref 70–99)
Glucose-Capillary: 62 mg/dL — ABNORMAL LOW (ref 70–99)

## 2014-06-11 LAB — OCCULT BLOOD, POC DEVICE: FECAL OCCULT BLD: NEGATIVE

## 2014-06-11 LAB — PREPARE RBC (CROSSMATCH)

## 2014-06-11 LAB — FOLATE: FOLATE: 11.9 ng/mL

## 2014-06-11 LAB — VITAMIN B12: VITAMIN B 12: 426 pg/mL (ref 211–911)

## 2014-06-11 MED ORDER — DIPHENHYDRAMINE HCL 50 MG/ML IJ SOLN
25.0000 mg | Freq: Once | INTRAMUSCULAR | Status: AC
  Administered 2014-06-11: 25 mg via INTRAVENOUS
  Filled 2014-06-11: qty 1

## 2014-06-11 MED ORDER — SODIUM CHLORIDE 0.9 % IV SOLN
Freq: Once | INTRAVENOUS | Status: AC
  Administered 2014-06-11: 10:00:00 via INTRAVENOUS

## 2014-06-11 MED ORDER — SODIUM CHLORIDE 0.9 % IV SOLN
500.0000 mg | Freq: Every day | INTRAVENOUS | Status: AC
  Administered 2014-06-11 – 2014-06-12 (×2): 500 mg via INTRAVENOUS
  Filled 2014-06-11 (×2): qty 10

## 2014-06-11 MED ORDER — SODIUM CHLORIDE 0.9 % IV SOLN: INTRAVENOUS | Status: DC

## 2014-06-11 MED ORDER — INSULIN ASPART 100 UNIT/ML ~~LOC~~ SOLN
0.0000 [IU] | Freq: Three times a day (TID) | SUBCUTANEOUS | Status: DC
Start: 2014-06-11 — End: 2014-06-12
  Administered 2014-06-11: 1 [IU] via SUBCUTANEOUS

## 2014-06-11 MED ORDER — ACETAMINOPHEN 325 MG PO TABS
650.0000 mg | ORAL_TABLET | Freq: Once | ORAL | Status: AC
  Administered 2014-06-11: 650 mg via ORAL
  Filled 2014-06-11: qty 2

## 2014-06-11 MED ORDER — PEG 3350-KCL-NA BICARB-NACL 420 G PO SOLR
4000.0000 mL | Freq: Once | ORAL | Status: AC
  Administered 2014-06-11: 4000 mL via ORAL
  Filled 2014-06-11: qty 4000

## 2014-06-11 MED ORDER — SODIUM CHLORIDE 0.9 % IV SOLN
25.0000 mg | Freq: Once | INTRAVENOUS | Status: AC
  Administered 2014-06-11: 25 mg via INTRAVENOUS
  Filled 2014-06-11: qty 0.5

## 2014-06-11 MED ORDER — FUROSEMIDE 10 MG/ML IJ SOLN
40.0000 mg | Freq: Two times a day (BID) | INTRAMUSCULAR | Status: DC
  Administered 2014-06-11 – 2014-06-12 (×2): 40 mg via INTRAVENOUS
  Filled 2014-06-11 (×3): qty 4

## 2014-06-11 MED ORDER — FUROSEMIDE 10 MG/ML IJ SOLN: 20.0000 mg | Freq: Once | INTRAMUSCULAR | Status: DC

## 2014-06-11 NOTE — Progress Notes (Signed)
Inpatient Diabetes Program Recommendations  AACE/ADA: New Consensus Statement on Inpatient Glycemic Control (2013)  Target Ranges:  Prepandial:   less than 140 mg/dL      Peak postprandial:   less than 180 mg/dL (1-2 hours)      Critically ill patients:  140 - 180 mg/dL     Per H&P, patient with History of Type 2 DM.  Home DM Meds: Metformin 500 mg daily   MD- Please place order for CBG checks and Novolog Sensitive SSI if CBGs elevated.     Will follow Wyn Quaker RN, MSN, CDE Diabetes Coordinator Inpatient Diabetes Program Team Pager: [phone] (8a-10p)

## 2014-06-11 NOTE — Progress Notes (Signed)
Patient ID: Christine Gay, female   DOB: 04/20/1941, 73 y.o.   MRN: [id number]   SUBJECTIVE: Feels ok currently.  At home, for 2-3 weeks has been getting short of breath and tight in her chest after walking about 30 feet.  No BRBPR or melena.  She has gained weight at home as well.   Scheduled Meds: . sodium chloride   Intravenous Once  . acetaminophen  650 mg Oral Once  . amLODipine  5 mg Oral Daily  . aspirin EC  81 mg Oral Daily  . atorvastatin  80 mg Oral Daily  . brimonidine  1 drop Both Eyes Daily   And  . timolol  1 drop Both Eyes Daily  . carvedilol  6.25 mg Oral BID  . diphenhydrAMINE  25 mg Intravenous Once  . furosemide  40 mg Intravenous BID  . heparin  5,000 Units Subcutaneous 3 times per day  . Influenza vac split quadrivalent PF  0.5 mL Intramuscular Tomorrow-1000  . isosorbide mononitrate  120 mg Oral BID  . levothyroxine  100 mcg Oral QAC breakfast  . loteprednol  1 drop Both Eyes Daily  . metFORMIN  500 mg Oral Q breakfast  . pantoprazole  40 mg Oral Daily  . phenytoin  100 mg Oral QHS  . phenytoin  200 mg Oral Daily  . potassium chloride  10 mEq Oral QHS  . potassium chloride  20 mEq Oral Daily   Continuous Infusions:  PRN Meds:.nitroGLYCERIN    Filed Vitals:   06/10/14 2100 06/10/14 2115 06/10/14 2224 06/11/14 0510  BP: 131/57 123/42 109/49 113/83  Pulse: 73 74 55 69  Temp:   98.5 F (36.9 C) 98.6 F (37 C)  TempSrc:   Oral Oral  Resp: 15 20 17 17   Height:   5' (1.524 m)   Weight:   154 lb 8.7 oz (70.1 kg)   SpO2: 100% 98% 100% 100%   No intake or output data in the 24 hours ending 06/11/14 0839  LABS: Basic Metabolic Panel:  Recent Labs  06/10/14 1443  NA 139  K 3.5*  CL 101  CO2 23  GLUCOSE 121*  BUN 10  CREATININE 0.87  CALCIUM 8.3*   Liver Function Tests:  Recent Labs  06/10/14 1443  AST 18  ALT 11  ALKPHOS 92  BILITOT 0.3  PROT 7.1  ALBUMIN 3.4*   No results found for this basename: LIPASE, AMYLASE,  in the last 72  hours CBC:  Recent Labs  06/10/14 1443 06/11/14 0627  WBC 6.4 4.5  HGB 7.0* 7.3*  HCT 23.6* 24.2*  MCV 70.4* 70.3*  PLT 267 245   Cardiac Enzymes:  Recent Labs  06/10/14 1730  TROPONINI <0.30   BNP: No components found with this basename: POCBNP,  D-Dimer: No results found for this basename: DDIMER,  in the last 72 hours Hemoglobin A1C: No results found for this basename: HGBA1C,  in the last 72 hours Fasting Lipid Panel: No results found for this basename: CHOL, HDL, LDLCALC, TRIG, CHOLHDL, LDLDIRECT,  in the last 72 hours Thyroid Function Tests: No results found for this basename: TSH, T4TOTAL, FREET3, T3FREE, THYROIDAB,  in the last 72 hours Anemia Panel:  Recent Labs  06/10/14 1948  VITAMINB12 426  FOLATE 11.9  FERRITIN 8*  TIBC Not calculated due to Iron <10.  IRON <10*  RETICCTPCT 1.4    RADIOLOGY: Dg Chest 2 View  06/10/2014   CLINICAL DATA:  Midsternal chest pain dizziness and  nausea with shortness of breath. History of previous CABG as well as angioplasty and stent placement  EXAM: CHEST  2 VIEW  COMPARISON:  PA and lateral chest of January 22, 2013  FINDINGS: The lungs are well-expanded and clear. The cardiopericardial silhouette is enlarged but stable. The pulmonary vascularity is not engorged. There are 6 intact sternal wires present. There is mild tortuosity of the descending thoracic aorta. There are loops of mildly distended fluid and gas-filled bowel in the upper abdomen. The observed portions of the bony thorax exhibit no acute abnormalities. There is sclerosis of portions of the left humeral head.  IMPRESSION: Stable cardiomegaly without evidence of CHF. There is no evidence of pneumonia.   Electronically Signed   By: David  Martinique   On: 06/10/2014 15:21    PHYSICAL EXAM General: NAD Neck: JVP 8-9 cm, no thyromegaly or thyroid nodule.  Lungs: Clear to auscultation bilaterally with normal respiratory effort. CV: Nondisplaced PMI.  Heart regular  S1/S2, no S3/S4, no murmur.  No peripheral edema.  No carotid bruit.  Normal pedal pulses.  Abdomen: Soft, nontender, no hepatosplenomegaly, no distention.  Neurologic: Alert and oriented x 3.  Psych: Normal affect. Extremities: No clubbing or cyanosis.   TELEMETRY: Reviewed telemetry pt in NSR  ASSESSMENT AND PLAN: 73 yo with history of CAD s/p CABG and chronic diastolic CHF presented with increased exertional dyspnea/chest tightness.  She was found to be anemic with hemoglobin 7.  1. Anemia: Patient has baseline anemia but current hemoglobin is lower than she has been.  She denies melena and BRBPR.  One stool guaiac negative in ER.   - Agree with transfusion of 1 unit PRBCs.   - Workup with B12, folate, Fe studies.  - Think she needs endoscopy, c-scope + EGD.  Primary team to contact GI.  - She is > 1 year post-DES.  Will hold Plavix for now with fall in hemoglobin. Continue ASA.  2. Acute on chronic diastolic CHF: She has gained weight at home.  Possible mild volume overload.  Especially as she is getting blood products, will treat for 1-2 days with Lasix 40 mg IV bid.  3. Exertional dyspnea/chest tightness: This may be related to anemia and mild volume overload.  She did have a Cardiolite in 5/15 that was a low risk study.  Will correct the above to see if she feels better.  4. CAD: s/p CABG.  Angina is a possibility, however she had recent low risk Cardiolite.  For now, will correct anemia and volume overload.  Continue ASA 81, statin, and her home anti-anginal regimen.   [name] 06/11/2014 8:44 AM

## 2014-06-11 NOTE — Progress Notes (Signed)
MEDICATION RELATED CONSULT NOTE  Pharmacy Consult for IV iron Indication: iron-deficiency anemia  Allergies  Allergen Reactions  . Meperidine Hcl Other (See Comments)    Demerol - mental status changes    . Nitroglycerin Other (See Comments)    Patch and Cream only - blisters  . Morphine Palpitations    Patient Measurements: Height: 5' (152.4 cm) Weight: 154 lb 8.7 oz (70.1 kg) IBW/kg (Calculated) : 45.5  Vital Signs: Temp: 97.4 F (36.3 C) (09/15 1251) Temp src: Oral (09/15 1251) BP: 101/51 mmHg (09/15 1251) Pulse Rate: 47 (09/15 1251)  Labs:  Recent Labs  06/10/14 1443 06/11/14 0627  WBC 6.4 4.5  HGB 7.0* 7.3*  HCT 23.6* 24.2*  PLT 267 245  CREATININE 0.87  --   ALBUMIN 3.4*  --   PROT 7.1  --   AST 18  --   ALT 11  --   ALKPHOS 92  --   BILITOT 0.3  --    Estimated Creatinine Clearance: 50.3 ml/min (by C-G formula based on Cr of 0.87).  Medical History: Past Medical History  Diagnosis Date  . CAD (coronary artery disease)     a. s/p CABG 1992, b. s/p PCI several years ago; c. LHC 11/08: L-LAD ok, S-OM1 ok, S-OM2 CTO, prox to mid CFX 70% (unchanged), RCA non-dominant, occluded;  d.  Echo 2/11: EF 55%, mod LVH, Ao sclerosis;  e. Echo 6/14: mild LVH, mild focal basal sept hypertrophy, EF 55-65%, inf HK, Mild AI, mild MR, mild BAE;  f. Myoview 7/14: int risk-> cath 04/2013 s/p PTCA/DES to prox Cx 05/04/13.  . Obesity   . GERD (gastroesophageal reflux disease)   . LBBB (left bundle branch block)     chronic  . Glaucoma   . Hypothyroidism   . Hypertension   . Chronic diastolic CHF (congestive heart failure)   . High cholesterol   . Anginal pain   . Myocardial infarction 03/1991 X 2; 07/1991  . Type II diabetes mellitus   . Anemia     a. Noted on labs 04/2013.  . Sickle cell trait   . Seizures     "I'm on Dilantin" (06/10/2014)  . Arthritis     "legs" (06/10/2014)   Assessment:   Serum iron < 10, so unable to calculate t-sat.   Hgb 7.3, transfusion  almost complete   For IV iron replacement:  Calculated deficit ~ 1000-1200 mg.     Noted plan for colonoscopy and endoscopy on 9/16.  Goal of Therapy:   Hgb 12-14  Plan:   Infed 25 mg IV test dose.  If test dose tolerated, will give Infed 500 mg IV daily x 2 doses.    Arty Baumgartner, [place] Pager: [phone] 06/11/2014,1:09 PM

## 2014-06-11 NOTE — Progress Notes (Signed)
Pt's BP low 80's/50's. Pt is alert and oriented, just states she is a little sleepy from benadryl. Ghimire MD aware of low BP and pulse around 40-50's. States to continue blood and monitor pt. If BP does not increase, RN to notify MD

## 2014-06-11 NOTE — Consult Note (Signed)
Referring Provider:  Dr. Oren Binet Primary Care Physician:  Kandice Hams, MD Primary Gastroenterologist:  Dr. Wynetta Emery  Reason for Consultation:  Iron deficiency anemia  HPI: Christine Gay is a 73 y.o. female who had a negative colonoscopy in July 2006 by Dr. Earle Gell, who was admitted to the hospital yesterday with "chest tightness" and found to have severe microcytic anemia, with a hemoglobin of 7.0, MCV 70, ferritin level 8, stool Hemoccult negative. She has been transfused to a hemoglobin of 8.9 and is now receiving IV iron as well. Her troponin was negative at time of presentation. Retrospectively, the patient notes recent ice craving as well as dyspnea on moderate exertion, such as walking moderate distances.  There is no prior history of anemia, to my knowledge. Risk factors for anemia in this patient include daily aspirin use, plus the chronic use of a PPI (Protonix 40 mg daily), which can interfere with dietary iron absorption. In addition, she is on Plavix which would tend to magnify any GI tract blood loss occurring from her aspirin.  The patient does not have significant localizing GI tract symptoms to suggest an underlying ulcer or malignancy. She does have some mild intermittent dysphagia to solid food, relieved by drinking extra water. This might happen every couple of weeks. There has also been a substantial weight loss over the past year or, probably about 80 pounds, through diet and exercise. No problem with anorexia, abdominal pain, significant or ongoing heartburn (had one episode of nocturnal indigestion which mimicked a heart attack), GI bleeding, constipation, or chronic diarrhea.   Past Medical History  Diagnosis Date  . CAD (coronary artery disease)     a. s/p CABG 1992, b. s/p PCI several years ago; c. LHC 11/08: L-LAD ok, S-OM1 ok, S-OM2 CTO, prox to mid CFX 70% (unchanged), RCA non-dominant, occluded;  d.  Echo 2/11: EF 55%, mod LVH, Ao sclerosis;  e. Echo  6/14: mild LVH, mild focal basal sept hypertrophy, EF 55-65%, inf HK, Mild AI, mild MR, mild BAE;  f. Myoview 7/14: int risk-> cath 04/2013 s/p PTCA/DES to prox Cx 05/04/13.  . Obesity   . GERD (gastroesophageal reflux disease)   . LBBB (left bundle branch block)     chronic  . Glaucoma   . Hypothyroidism   . Hypertension   . Chronic diastolic CHF (congestive heart failure)   . High cholesterol   . Anginal pain   . Myocardial infarction 03/1991 X 2; 07/1991  . Type II diabetes mellitus   . Anemia     a. Noted on labs 04/2013.  . Sickle cell trait   . Seizures     "I'm on Dilantin" (06/10/2014)  . Arthritis     "legs" (06/10/2014)    Past Surgical History  Procedure Laterality Date  . Cataract extraction w/ intraocular lens  implant, bilateral Bilateral   . Cholecystectomy    . Tubal ligation Bilateral 1969  . Coronary angioplasty with stent placement  ? date; 05/04/2013    ? location; DES  to [place]  . Cardiac catheterization      "several;  they went up to check"  . Coronary artery bypass graft  07/1991    "CABG X 3"    Prior to Admission medications   Medication Sig Start Date End Date Taking? Authorizing Provider  amLODipine (NORVASC) 5 MG tablet Take 5 mg by mouth daily.   Yes Historical Provider, MD  aspirin 81 MG tablet Take 81 mg by mouth daily.  Yes Historical Provider, MD  atorvastatin (LIPITOR) 80 MG tablet Take 80 mg by mouth daily.   Yes Historical Provider, MD  brimonidine-timolol (COMBIGAN) 0.2-0.5 % ophthalmic solution Place 1 drop into both eyes daily.    Yes Historical Provider, MD  carvedilol (COREG) 6.25 MG tablet Take 1 tablet (6.25 mg total) by mouth 2 (two) times daily. 02/15/14  Yes Larey Dresser, MD  clopidogrel (PLAVIX) 75 MG tablet Take 75 mg by mouth daily.   Yes Historical Provider, MD  furosemide (LASIX) 40 MG tablet Take 40 mg by mouth at bedtime. 06/05/14  Yes Larey Dresser, MD  ibandronate (BONIVA) 150 MG tablet Take 150 mg by mouth every 30  (thirty) days. Take in the morning with a full glass of water, on an empty stomach, and do not take anything else by mouth or lie down for the next 30 min.   Yes Historical Provider, MD  isosorbide mononitrate (IMDUR) 60 MG 24 hr tablet Take 120 mg by mouth 2 (two) times daily. Patient states she takes two tablets twice a day   Yes Historical Provider, MD  levothyroxine (SYNTHROID, LEVOTHROID) 100 MCG tablet Take 100 mcg by mouth daily before breakfast.   Yes Historical Provider, MD  loteprednol (LOTEMAX) 0.5 % ophthalmic suspension Place 1 drop into both eyes daily.    Yes Historical Provider, MD  metFORMIN (GLUCOPHAGE-XR) 500 MG 24 hr tablet Take 500 mg by mouth daily. 05/08/13  Yes Dayna N Dunn, PA-C  nitroGLYCERIN (NITROSTAT) 0.4 MG SL tablet Place 1 tablet (0.4 mg total) under the tongue every 5 (five) minutes as needed for chest pain. 02/15/14  Yes Larey Dresser, MD  pantoprazole (PROTONIX) 40 MG tablet TAKE 1 TABLET BY MOUTH DAILY.   Yes Larey Dresser, MD  phenytoin (DILANTIN) 100 MG ER capsule Take 100-200 mg by mouth 2 (two) times daily. Takes 2 tablets in am and 1 tablet in pm   Yes Historical Provider, MD  potassium chloride SA (K-DUR,KLOR-CON) 20 MEQ tablet Take 10-20 mEq by mouth daily. Take a 20mg   tablet in the AM and 10mg  tablet in the evening   Yes Historical Provider, MD  prednisoLONE sodium phosphate (INFLAMASE FORTE) 1 % ophthalmic solution Place 1 drop into both eyes every other day. Every other day   Yes Historical Provider, MD    Current Facility-Administered Medications  Medication Dose Route Frequency Provider Last Rate Last Dose  . amLODipine (NORVASC) tablet 5 mg  5 mg Oral Daily Etta Quill, DO   5 mg at 06/11/14 1016  . aspirin EC tablet 81 mg  81 mg Oral Daily Etta Quill, DO   81 mg at 06/11/14 1020  . atorvastatin (LIPITOR) tablet 80 mg  80 mg Oral Daily Etta Quill, DO   80 mg at 06/11/14 1017  . brimonidine (ALPHAGAN) 0.2 % ophthalmic solution 1 drop   1 drop Both Eyes Daily Etta Quill, DO   1 drop at 06/11/14 1019   And  . timolol (TIMOPTIC) 0.5 % ophthalmic solution 1 drop  1 drop Both Eyes Daily Etta Quill, DO   1 drop at 06/11/14 1019  . carvedilol (COREG) tablet 6.25 mg  6.25 mg Oral BID Etta Quill, DO      . furosemide (LASIX) injection 40 mg  40 mg Intravenous BID Larey Dresser, MD   40 mg at 06/11/14 1012  . heparin injection 5,000 Units  5,000 Units Subcutaneous 3 times per day Kevan Ny  Nestor Lewandowsky, DO   5,000 Units at 06/11/14 1301  . insulin aspart (novoLOG) injection 0-9 Units  0-9 Units Subcutaneous TID WC Jonetta Osgood, MD   1 Units at 06/11/14 1255  . iron dextran complex (INFED) 500 mg in sodium chloride 0.9 % 250 mL IVPB  500 mg Intravenous Q1200 Jonetta Osgood, MD   500 mg at 06/11/14 1600  . isosorbide mononitrate (IMDUR) 24 hr tablet 120 mg  120 mg Oral BID Etta Quill, DO   120 mg at 06/11/14 1016  . levothyroxine (SYNTHROID, LEVOTHROID) tablet 100 mcg  100 mcg Oral QAC breakfast Etta Quill, DO   100 mcg at 06/11/14 1443  . loteprednol (LOTEMAX) 0.5 % ophthalmic suspension 1 drop  1 drop Both Eyes Daily Etta Quill, DO   1 drop at 06/11/14 1018  . nitroGLYCERIN (NITROSTAT) SL tablet 0.4 mg  0.4 mg Sublingual Q5 min PRN Etta Quill, DO      . pantoprazole (PROTONIX) EC tablet 40 mg  40 mg Oral Daily Etta Quill, DO   40 mg at 06/11/14 1012  . phenytoin (DILANTIN) ER capsule 100 mg  100 mg Oral QHS Etta Quill, DO   100 mg at 06/10/14 2258  . phenytoin (DILANTIN) ER capsule 200 mg  200 mg Oral Daily Etta Quill, DO   200 mg at 06/11/14 1017  . polyethylene glycol-electrolytes (NuLYTELY/GoLYTELY) solution 4,000 mL  4,000 mL Oral Once Alizaya Oshea V, MD      . potassium chloride (K-DUR,KLOR-CON) CR tablet 10 mEq  10 mEq Oral QHS Etta Quill, DO   10 mEq at 06/10/14 2323  . potassium chloride SA (K-DUR,KLOR-CON) CR tablet 20 mEq  20 mEq Oral Daily Etta Quill, DO   20 mEq at  06/11/14 1016    Allergies as of 06/10/2014 - Review Complete 06/10/2014  Allergen Reaction Noted  . Meperidine hcl Other (See Comments) 03/20/2009  . Nitroglycerin Other (See Comments) 03/20/2012  . Morphine Palpitations 03/20/2009    Family History  Problem Relation Age of Onset  . Diabetes Mother     History   Social History  . Marital Status: Married    Spouse Name: N/A    Number of Children: N/A  . Years of Education: N/A   Occupational History  . Not on file.   Social History Main Topics  . Smoking status: Never Smoker   . Smokeless tobacco: Never Used  . Alcohol Use: No  . Drug Use: No  . Sexual Activity: No   Other Topics Concern  . Not on file   Social History Narrative  . No narrative on file    Review of Systems: Currently negative for chest pain, no current shortness of breath or chronic cough, no joint pains or skin rashes or urinary symptoms. Please see history of present illness for GI review of systems.  Physical Exam: Vital signs in last 24 hours: Temp:  [97.3 F (36.3 C)-99.6 F (37.6 C)] 97.5 F (36.4 C) (09/15 1402) Pulse Rate:  [43-74] 43 (09/15 1402) Resp:  [14-23] 16 (09/15 1402) BP: (82-134)/(42-83) 95/57 mmHg (09/15 1408) SpO2:  [98 %-100 %] 100 % (09/15 1402) Weight:  [70.1 kg (154 lb 8.7 oz)] 70.1 kg (154 lb 8.7 oz) (09/14 2224) Last BM Date: 06/10/14 General:   Alert,  Well-developed, well-nourished, pleasant and cooperative in NAD Head:  Normocephalic and atraumatic. Eyes:  Sclera clear, no icterus.   Lungs:  Clear throughout  to auscultation.   No wheezes, crackles, or rhonchi. No evident respiratory distress. Heart:   Regular rate and rhythm; no murmurs, clicks, rubs,  or gallops. Abdomen:  Soft, nontender, nontympanitic, and nondistended but somewhat obese.  Msk:   Symmetrical without gross deformities. Neurologic:  Alert and coherent;  grossly normal neurologically. Skin:  Intact without significant lesions or  rashes. Psych:   Alert and cooperative. Normal mood and affect.  Intake/Output from previous day:   Intake/Output this shift: Total I/O In: 747.5 [P.O.:360; Blood:337.5; IV Piggyback:50] Out: 300 [Urine:300]  Lab Results:  Recent Labs  06/10/14 1443 06/11/14 0627 06/11/14 1554  WBC 6.4 4.5 6.3  HGB 7.0* 7.3* 8.9*  HCT 23.6* 24.2* 29.1*  PLT 267 245 239   BMET  Recent Labs  06/10/14 1443  NA 139  K 3.5*  CL 101  CO2 23  GLUCOSE 121*  BUN 10  CREATININE 0.87  CALCIUM 8.3*   LFT  Recent Labs  06/10/14 1443  PROT 7.1  ALBUMIN 3.4*  AST 18  ALT 11  ALKPHOS 92  BILITOT 0.3   PT/INR  Recent Labs  06/10/14 1443  LABPROT 15.4*  INR 1.22    Studies/Results: Dg Chest 2 View  06/10/2014   CLINICAL DATA:  Midsternal chest pain dizziness and nausea with shortness of breath. History of previous CABG as well as angioplasty and stent placement  EXAM: CHEST  2 VIEW  COMPARISON:  PA and lateral chest of January 22, 2013  FINDINGS: The lungs are well-expanded and clear. The cardiopericardial silhouette is enlarged but stable. The pulmonary vascularity is not engorged. There are 6 intact sternal wires present. There is mild tortuosity of the descending thoracic aorta. There are loops of mildly distended fluid and gas-filled bowel in the upper abdomen. The observed portions of the bony thorax exhibit no acute abnormalities. There is sclerosis of portions of the left humeral head.  IMPRESSION: Stable cardiomegaly without evidence of CHF. There is no evidence of pneumonia.   Electronically Signed   By: David  Martinique   On: 06/10/2014 15:21    Impression: 1. Microcytic anemia with currently Hemoccult negative stool 2. History of reflux 3. Mild intermittent dysphagia  Plan: Endoscopy and colonoscopy tomorrow under propofol sedation. Petra Kuba, purpose, risks reviewed, patient agreeable.   Since she is Hemoccult negative and does not have symptoms referable to the small  intestine, I do not think she would necessarily have to have a small bowel capsule endoscopy for completion of her workup if the endoscopy and colonoscopy are negative. However, in that instance, I would favor outpatient Hemoccult testing as added confirmation of the absence of ongoing GI tract blood loss.   LOS: 1 day   Evrett Hakim V  06/11/2014, 4:53 PM

## 2014-06-11 NOTE — Progress Notes (Signed)
Utilization Review Completed.Levy Cedano T9/15/2015  

## 2014-06-11 NOTE — Progress Notes (Signed)
Pt's BP and pulse still somewhat low. Ghimire MD aware. Will continue to monitor pt.

## 2014-06-11 NOTE — Progress Notes (Signed)
Hypoglycemic Event  CBG: 62  Treatment: 15 GM carbohydrate snack  Symptoms: None  Follow-up CBG: Time:2204 CBG Result:98   Possible Reasons for Event: Inadequate meal intake  Comments/MD notified:[name]    Christine Gay  Remember to initiate Hypoglycemia Order Set & complete

## 2014-06-11 NOTE — Progress Notes (Signed)
PROGRESS NOTE  Christine Gay JQZ:[id number] DOB: 18-Apr-1941 DOA: 06/10/2014 PCP: Kandice Hams, MD  HPI/Subjective: Pt complains of generalized weakness, DOE and chest heaviness and tightness with exertion.  Denies melena, BRBPR, and emesis. States she has gained 22 lbs in past 3 weeks (128lbs to 150lbs in hospital).  Admits to eating salty foods at home.    Assessment/Plan: Microcytic anemia -?subacute vs chronic blood loss - Anemia profile confirms iron deficiency anemia.  No overt signs of GI bleed.  However suspicion for either a recent GI bleed or slow GI bleed. Given symptoms of chest pain/SOB on exertion, will transfuse 1 unit of PRBC, and start IV Iron. GI consulted and as patient prefers to have Colonoscopy as inpatient. Spoke with cards-Dr McLean-will stop Plavix and continue ASA.  -check CBC post transfusion  Suspected Slow GI Bleed - Suspected due to iron deficiency anemia, although no clinical signs of GI bleed.  Negative fecal occult test in ED. - GI consulted for for endoscopy and colonoscopy tomorrow.  Acute on chronic diastolic CHF - She has gained weight at home (Subjective 22 lbs weight gain in past 3 weeks.) Possible mild volume overload -on IV Lasix, check daily weights, keep in negative balance -last Echo on 06/14-EF 55-60%  Chest Pain -known CAD-likely angina secondary to worsening anemia. Currently without chest pain -Transfuse one unit PRBC,per cards note Cardiolite in 5/15 was low risk-reassuring -reassess symptoms post transfusion  Hx of Coronary Artery Disease-S/P CABG, s/p PCI with DES 05/2013 - see above -re chest pain -spoke with cards-Dr McLean-will stop Plavix and continue ASA,Imdur, Statins and Coreg   GERD - Stable.  Continue PPI.   Diabetes - Discontinue Metformin.  Manage glucose levels with SSI.  Glaucoma - Stable.  Continue Lotemax and Timoptic.   Hypothyroidism - Clinically stable.  Continue Synthroid.  Hypertension - BP well  controlled.  Continue Norvasc, Coreg, Lasix.  DVT Prophylaxis: Cautiously continue with SQ Heparin-no overt bleeding    Code Status: Full Family Communication: No family at bedside.  Patient is alert and agreeable to plan.   Disposition Plan: Remain inpatient.  Consultants:  Cardiology, Gastroenterology  Objective: Filed Vitals:   06/10/14 2224 06/11/14 0510 06/11/14 1019 06/11/14 1051  BP: 109/49 113/83 134/55 107/44  Pulse: 55 69 52 52  Temp: 98.5 F (36.9 C) 98.6 F (37 C) 97.3 F (36.3 C) 98.5 F (36.9 C)  TempSrc: Oral Oral Oral Oral  Resp: 17 17 18 16   Height: 5' (1.524 m)     Weight: 70.1 kg (154 lb 8.7 oz)     SpO2: 100% 100% 100% 100%    Intake/Output Summary (Last 24 hours) at 06/11/14 1058 Last data filed at 06/11/14 1036  Gross per 24 hour  Intake  132.5 ml  Output      0 ml  Net  132.5 ml   Filed Weights   06/10/14 2224  Weight: 70.1 kg (154 lb 8.7 oz)    Exam: General: Well developed, well nourished elderly female in NAD.  Appears stated age.    HEENT: Anicteic Sclera, MMM. No pharyngeal erythema or exudates  Neck: Supple, no JVD, no masses  Cardiovascular: RRR, S1 S2 auscultated, no rubs, murmurs or gallops.   Respiratory: Diminished breath sounds bilaterally with equal chest rise  Abdomen: Soft, nontender, nondistended, + bowel sounds  Extremities: lower extremities warm dry without cyanosis clubbing or edema.  2+ PT pulses. Neuro: AAOx3, cranial nerves grossly intact. Strength 5/5 in upper and lower extremities  Psych: Normal affect and demeanor with intact judgement and insight  Data Reviewed: Basic Metabolic Panel:  Recent Labs Lab 06/10/14 1443  NA 139  K 3.5*  CL 101  CO2 23  GLUCOSE 121*  BUN 10  CREATININE 0.87  CALCIUM 8.3*   Liver Function Tests:  Recent Labs Lab 06/10/14 1443  AST 18  ALT 11  ALKPHOS 92  BILITOT 0.3  PROT 7.1  ALBUMIN 3.4*   CBC:  Recent Labs Lab 06/10/14 1443 06/11/14 0627  WBC 6.4 4.5    HGB 7.0* 7.3*  HCT 23.6* 24.2*  MCV 70.4* 70.3*  PLT 267 245   Cardiac Enzymes:  Recent Labs Lab 06/10/14 1730  TROPONINI <0.30   BNP (last 3 results)  Recent Labs  08/13/13 0952 06/10/14 1730  PROBNP 88.0 858.6*    Studies: Dg Chest 2 View  06/10/2014   CLINICAL DATA:  Midsternal chest pain dizziness and nausea with shortness of breath. History of previous CABG as well as angioplasty and stent placement  EXAM: CHEST  2 VIEW  COMPARISON:  PA and lateral chest of January 22, 2013  FINDINGS: The lungs are well-expanded and clear. The cardiopericardial silhouette is enlarged but stable. The pulmonary vascularity is not engorged. There are 6 intact sternal wires present. There is mild tortuosity of the descending thoracic aorta. There are loops of mildly distended fluid and gas-filled bowel in the upper abdomen. The observed portions of the bony thorax exhibit no acute abnormalities. There is sclerosis of portions of the left humeral head.  IMPRESSION: Stable cardiomegaly without evidence of CHF. There is no evidence of pneumonia.   Electronically Signed   By: David  Martinique   On: 06/10/2014 15:21    Scheduled Meds: . amLODipine  5 mg Oral Daily  . aspirin EC  81 mg Oral Daily  . atorvastatin  80 mg Oral Daily  . brimonidine  1 drop Both Eyes Daily   And  . timolol  1 drop Both Eyes Daily  . carvedilol  6.25 mg Oral BID  . furosemide  40 mg Intravenous BID  . heparin  5,000 Units Subcutaneous 3 times per day  . isosorbide mononitrate  120 mg Oral BID  . levothyroxine  100 mcg Oral QAC breakfast  . loteprednol  1 drop Both Eyes Daily  . metFORMIN  500 mg Oral Q breakfast  . pantoprazole  40 mg Oral Daily  . phenytoin  100 mg Oral QHS  . phenytoin  200 mg Oral Daily  . potassium chloride  10 mEq Oral QHS  . potassium chloride  20 mEq Oral Daily   Continuous Infusions:   Principal Problem:   Microcytic hypochromic anemia Active Problems:   Unspecified hypothyroidism    HYPERTENSION, BENIGN   CAD, ARTERY BYPASS GRAFT   CHF (congestive heart failure)   GERD (gastroesophageal reflux disease)   Type II or unspecified type diabetes mellitus without mention of complication, not stated as uncontrolled   Glaucoma   Christine Germany, PA-S [facility] Pager [phone]. If 7PM-7AM, please contact night-coverage at [email or web address], password [facility] 06/11/2014, 10:58 AM  LOS: 1 day   Attending Patient was seen, examined,treatment plan was discussed with the Physician extender. I have directly reviewed the clinical findings, lab, imaging studies and management of this patient in detail. I have made the necessary changes to the above noted documentation, and agree with the documentation, as recorded by the Physician extender.  Nena Alexander MD [facility].

## 2014-06-12 ENCOUNTER — Observation Stay (HOSPITAL_COMMUNITY): Payer: Medicare Other | Admitting: Certified Registered Nurse Anesthetist

## 2014-06-12 ENCOUNTER — Encounter (HOSPITAL_COMMUNITY)
Admission: EM | Disposition: A | Discharge status: Home / Self Care | Payer: Medicare Other | Source: Home / Self Care | Attending: Emergency Medicine

## 2014-06-12 ENCOUNTER — Encounter (HOSPITAL_COMMUNITY): Payer: Self-pay | Admitting: Certified Registered Nurse Anesthetist

## 2014-06-12 ENCOUNTER — Encounter (HOSPITAL_COMMUNITY): Payer: Medicare Other | Admitting: Certified Registered Nurse Anesthetist

## 2014-06-12 DIAGNOSIS — I10 Essential (primary) hypertension: Secondary | ICD-10-CM

## 2014-06-12 DIAGNOSIS — K922 Gastrointestinal hemorrhage, unspecified: Secondary | ICD-10-CM

## 2014-06-12 DIAGNOSIS — D509 Iron deficiency anemia, unspecified: Secondary | ICD-10-CM | POA: Diagnosis not present

## 2014-06-12 DIAGNOSIS — D126 Benign neoplasm of colon, unspecified: Secondary | ICD-10-CM | POA: Diagnosis not present

## 2014-06-12 DIAGNOSIS — I509 Heart failure, unspecified: Secondary | ICD-10-CM | POA: Diagnosis not present

## 2014-06-12 DIAGNOSIS — A048 Other specified bacterial intestinal infections: Secondary | ICD-10-CM | POA: Diagnosis not present

## 2014-06-12 DIAGNOSIS — I5033 Acute on chronic diastolic (congestive) heart failure: Secondary | ICD-10-CM | POA: Diagnosis not present

## 2014-06-12 DIAGNOSIS — R0789 Other chest pain: Secondary | ICD-10-CM

## 2014-06-12 DIAGNOSIS — D131 Benign neoplasm of stomach: Secondary | ICD-10-CM | POA: Diagnosis not present

## 2014-06-12 DIAGNOSIS — I25810 Atherosclerosis of coronary artery bypass graft(s) without angina pectoris: Secondary | ICD-10-CM | POA: Diagnosis not present

## 2014-06-12 HISTORY — PX: COLONOSCOPY: SHX5424

## 2014-06-12 HISTORY — PX: ESOPHAGOGASTRODUODENOSCOPY: SHX5428

## 2014-06-12 LAB — TYPE AND SCREEN
ABO/RH(D): A POS
Antibody Screen: NEGATIVE
Unit division: 0

## 2014-06-12 LAB — BASIC METABOLIC PANEL
ANION GAP: 12 (ref 5–15)
BUN: 8 mg/dL (ref 6–23)
CHLORIDE: 104 meq/L (ref 96–112)
CO2: 23 mEq/L (ref 19–32)
Calcium: 8.2 mg/dL — ABNORMAL LOW (ref 8.4–10.5)
Creatinine, Ser: 0.63 mg/dL (ref 0.50–1.10)
GFR calc non Af Amer: 87 mL/min — ABNORMAL LOW (ref >90)
Glucose, Bld: 80 mg/dL (ref 70–99)
POTASSIUM: 3.2 meq/L — AB (ref 3.7–5.3)
SODIUM: 139 meq/L (ref 137–147)

## 2014-06-12 LAB — CBC
HCT: 25.5 % — ABNORMAL LOW (ref 36.0–46.0)
HEMOGLOBIN: 8 g/dL — AB (ref 12.0–15.0)
MCH: 22.3 pg — ABNORMAL LOW (ref 26.0–34.0)
MCHC: 31.4 g/dL (ref 30.0–36.0)
MCV: 71 fL — ABNORMAL LOW (ref 78.0–100.0)
PLATELETS: 237 10*3/uL (ref 150–400)
RBC: 3.59 MIL/uL — AB (ref 3.87–5.11)
RDW: 18.1 % — ABNORMAL HIGH (ref 11.5–15.5)
WBC: 6.9 10*3/uL (ref 4.0–10.5)

## 2014-06-12 LAB — GLUCOSE, CAPILLARY
GLUCOSE-CAPILLARY: 79 mg/dL (ref 70–99)
Glucose-Capillary: 92 mg/dL (ref 70–99)
Glucose-Capillary: 108 mg/dL — ABNORMAL HIGH (ref 70–99)

## 2014-06-12 SURGERY — EGD (ESOPHAGOGASTRODUODENOSCOPY)
Anesthesia: General

## 2014-06-12 MED ORDER — FUROSEMIDE 40 MG PO TABS: ORAL_TABLET | ORAL | Status: DC

## 2014-06-12 MED ORDER — SUCCINYLCHOLINE CHLORIDE 20 MG/ML IJ SOLN
INTRAMUSCULAR | Status: DC | PRN
  Administered 2014-06-12: 50 mg via INTRAVENOUS

## 2014-06-12 MED ORDER — LACTATED RINGERS IV SOLN
INTRAVENOUS | Status: DC | PRN
  Administered 2014-06-12: 09:00:00 via INTRAVENOUS

## 2014-06-12 MED ORDER — FUROSEMIDE 40 MG PO TABS: 40.0000 mg | ORAL_TABLET | Freq: Two times a day (BID) | ORAL | Status: DC

## 2014-06-12 MED ORDER — FERROUS SULFATE 325 (65 FE) MG PO TABS
325.0000 mg | ORAL_TABLET | Freq: Two times a day (BID) | ORAL | Status: DC
Start: 2014-06-12 — End: 2018-08-08

## 2014-06-12 MED ORDER — PHENYLEPHRINE HCL 10 MG/ML IJ SOLN
INTRAMUSCULAR | Status: DC | PRN
  Administered 2014-06-12 (×3): 80 ug via INTRAVENOUS

## 2014-06-12 MED ORDER — FENTANYL CITRATE 0.05 MG/ML IJ SOLN
INTRAMUSCULAR | Status: DC | PRN
  Administered 2014-06-12: 25 ug via INTRAVENOUS
  Administered 2014-06-12: 50 ug via INTRAVENOUS

## 2014-06-12 MED ORDER — LIDOCAINE HCL (CARDIAC) 20 MG/ML IV SOLN
INTRAVENOUS | Status: DC | PRN
  Administered 2014-06-12: 80 mg via INTRAVENOUS

## 2014-06-12 MED ORDER — POTASSIUM CHLORIDE CRYS ER 20 MEQ PO TBCR
40.0000 meq | EXTENDED_RELEASE_TABLET | Freq: Once | ORAL | Status: AC
  Administered 2014-06-12: 40 meq via ORAL
  Filled 2014-06-12: qty 2

## 2014-06-12 MED ORDER — ONDANSETRON HCL 4 MG/2ML IJ SOLN
INTRAMUSCULAR | Status: DC | PRN
  Administered 2014-06-12: 4 mg via INTRAVENOUS

## 2014-06-12 MED ORDER — PROPOFOL 10 MG/ML IV BOLUS
INTRAVENOUS | Status: DC | PRN
  Administered 2014-06-12: 20 mg via INTRAVENOUS
  Administered 2014-06-12: 100 mg via INTRAVENOUS

## 2014-06-12 MED ORDER — LIDOCAINE HCL 4 % MT SOLN
OROMUCOSAL | Status: DC | PRN
  Administered 2014-06-12: 4 mL via TOPICAL

## 2014-06-12 NOTE — Progress Notes (Signed)
[name] endoscopy and colonoscopy were well tolerated.  The endoscopy shows a probably benign, friable polypoid lesion in the stomach, biopsies of which should be ready by tomorrow. No other source of anemia was identified on the current exam.  Recommendations:  1. Okay for discharge at any time from the GI tract standpoint 2. I would recommend that the patient followup with her primary physician for ongoing monitoring of her hemoglobin and her Hemoccult status, presumably while on oral iron supplementation. If it appears that she is Hemoccult negative and her hemoglobin improves, I do not think further diagnostic evaluation would be warranted. If she remains heme positive and/or if her hemoglobin does not improve despite iron supplementation, she would then probably need a small bowel capsule endoscopy study to complete her GI workup. 3. I will follow up with the patient on her gastric polyp biopsies. In my opinion, removal of this lesion, unless it shows worrisome dysplasia, would probably be riskier than leaving it in situ.  Please call if I can be of further assistance with this patient.  Cleotis Nipper, M.D. [phone]

## 2014-06-12 NOTE — Progress Notes (Signed)
Patient ID: Christine Gay, female   DOB: 25-Oct-1940, 73 y.o.   MRN: [id number]   SUBJECTIVE:  Feeling better this morning, no chest pain or dyspnea.  She was diuresed yesterday with IV Lasix and got blood transfusion.   Scheduled Meds: . [MAR HOLD] amLODipine  5 mg Oral Daily  . [facility] HOLD] aspirin EC  81 mg Oral Daily  . [facility] HOLD] atorvastatin  80 mg Oral Daily  . [MAR HOLD] brimonidine  1 drop Both Eyes Daily   And  . [MAR HOLD] timolol  1 drop Both Eyes Daily  . [facility] HOLD] carvedilol  6.25 mg Oral BID  . [facility] HOLD] furosemide  40 mg Intravenous BID  . [MAR HOLD] heparin  5,000 Units Subcutaneous 3 times per day  . [MAR HOLD] insulin aspart  0-9 Units Subcutaneous TID WC  . [MAR HOLD] iron dextran (INFED/DEXFERRUM) infusion  500 mg Intravenous Q1200  . [facility] HOLD] isosorbide mononitrate  120 mg Oral BID  . [facility] HOLD] levothyroxine  100 mcg Oral QAC breakfast  . [MAR HOLD] loteprednol  1 drop Both Eyes Daily  . [MAR HOLD] pantoprazole  40 mg Oral Daily  . [facility] HOLD] phenytoin  100 mg Oral QHS  . [facility] HOLD] phenytoin  200 mg Oral Daily  . [facility] HOLD] potassium chloride  10 mEq Oral QHS  . [facility] HOLD] potassium chloride  20 mEq Oral Daily  . potassium chloride  40 mEq Oral Once   Continuous Infusions: . sodium chloride     PRN Meds:.[facility] HOLD] nitroGLYCERIN    Filed Vitals:   06/11/14 1700 06/11/14 2120 06/12/14 0623 06/12/14 0847  BP: 101/52 141/93 100/40 119/91  Pulse: 56 54 63 48  Temp:  98.6 F (37 C) 99 F (37.2 C)   TempSrc:  Oral Oral   Resp:  16 18 20   Height:    5' (1.524 m)  Weight:   154 lb 8.7 oz (70.1 kg) 154 lb (69.854 kg)  SpO2:  100% 98% 100%    Intake/Output Summary (Last 24 hours) at 06/12/14 0932 Last data filed at 06/12/14 0024  Gross per 24 hour  Intake  997.5 ml  Output    726 ml  Net  271.5 ml    LABS: Basic Metabolic Panel:  Recent Labs  06/10/14 1443 06/12/14 0736  NA 139 139  K 3.5* 3.2*  CL 101 104  CO2 23 23  GLUCOSE 121* 80    BUN 10 8  CREATININE 0.87 0.63  CALCIUM 8.3* 8.2*   Liver Function Tests:  Recent Labs  06/10/14 1443  AST 18  ALT 11  ALKPHOS 92  BILITOT 0.3  PROT 7.1  ALBUMIN 3.4*   No results found for this basename: LIPASE, AMYLASE,  in the last 72 hours CBC:  Recent Labs  06/11/14 1554 06/12/14 0736  WBC 6.3 6.9  HGB 8.9* 8.0*  HCT 29.1* 25.5*  MCV 71.9* 71.0*  PLT 239 237   Cardiac Enzymes:  Recent Labs  06/10/14 1730  TROPONINI <0.30   BNP: No components found with this basename: POCBNP,  D-Dimer: No results found for this basename: DDIMER,  in the last 72 hours Hemoglobin A1C: No results found for this basename: HGBA1C,  in the last 72 hours Fasting Lipid Panel: No results found for this basename: CHOL, HDL, LDLCALC, TRIG, CHOLHDL, LDLDIRECT,  in the last 72 hours Thyroid Function Tests: No results found for this basename: TSH, T4TOTAL, FREET3, T3FREE, THYROIDAB,  in the last  72 hours Anemia Panel:  Recent Labs  06/10/14 1948  VITAMINB12 426  FOLATE 11.9  FERRITIN 8*  TIBC Not calculated due to Iron <10.  IRON <10*  RETICCTPCT 1.4    RADIOLOGY: Dg Chest 2 View  06/10/2014   CLINICAL DATA:  Midsternal chest pain dizziness and nausea with shortness of breath. History of previous CABG as well as angioplasty and stent placement  EXAM: CHEST  2 VIEW  COMPARISON:  PA and lateral chest of January 22, 2013  FINDINGS: The lungs are well-expanded and clear. The cardiopericardial silhouette is enlarged but stable. The pulmonary vascularity is not engorged. There are 6 intact sternal wires present. There is mild tortuosity of the descending thoracic aorta. There are loops of mildly distended fluid and gas-filled bowel in the upper abdomen. The observed portions of the bony thorax exhibit no acute abnormalities. There is sclerosis of portions of the left humeral head.  IMPRESSION: Stable cardiomegaly without evidence of CHF. There is no evidence of pneumonia.   Electronically  Signed   By: David  Martinique   On: 06/10/2014 15:21    PHYSICAL EXAM General: NAD Neck: JVP 8 cm, no thyromegaly or thyroid nodule.  Lungs: Clear to auscultation bilaterally with normal respiratory effort. CV: Nondisplaced PMI.  Heart regular S1/S2, no S3/S4, no murmur.  No peripheral edema.  No carotid bruit.  Normal pedal pulses.  Abdomen: Soft, nontender, no hepatosplenomegaly, no distention.  Neurologic: Alert and oriented x 3.  Psych: Normal affect. Extremities: No clubbing or cyanosis.   TELEMETRY: Reviewed telemetry pt in NSR  ASSESSMENT AND PLAN: 73 yo with history of CAD s/p CABG and chronic diastolic CHF presented with increased exertional dyspnea/chest tightness.  She was found to be anemic with hemoglobin 7.  1. Anemia: Patient has baseline anemia but hemoglobin at admission was well below her baseline.  She denies melena and BRBPR.  One stool guaiac negative in ER.  She was found to be iron deficient.  She was transfused and got IV iron.  Seen by GI, plan for c-scope and EGD today.  - She is > 1 year post-DES.  Will hold Plavix for now with fall in hemoglobin. Continue ASA.  2. Acute on chronic diastolic CHF: She has gained weight at home.  Suspect mild volume overload.  Will give one more day IV Lasix then back to her home po Lasix dosing.  3. Exertional dyspnea/chest tightness: This may be related to anemia and mild volume overload.  She did have a Cardiolite in 5/15 that was a low risk study.  Symptoms improved with diuresis and transfusion.  4. CAD: s/p CABG.  Angina is a possibility, however she had recent low risk Cardiolite.  For now, will correct anemia and volume overload.  Continue ASA 81, statin, and her home anti-anginal regimen.   [name] 06/12/2014 9:32 AM

## 2014-06-12 NOTE — Anesthesia Postprocedure Evaluation (Signed)
  Anesthesia Post-op Note  Patient: Christine Gay  Procedure(s) Performed: Procedure(s): ESOPHAGOGASTRODUODENOSCOPY (EGD) (N/A) COLONOSCOPY (N/A)  Patient Location: PACU  Anesthesia Type:General  Level of Consciousness: awake, alert , oriented and patient cooperative  Airway and Oxygen Therapy: Patient Spontanous Breathing  Post-op Pain: none  Post-op Assessment: Post-op Vital signs reviewed, [name] Cardiovascular Status Stable, Respiratory Function Stable, Patent Airway, No signs of Nausea or vomiting and Pain level controlled  Post-op Vital Signs: stable  Last Vitals:  Filed Vitals:   06/12/14 1105  BP: 167/69  Pulse:   Temp:   Resp:     Complications: No apparent anesthesia complications

## 2014-06-12 NOTE — Discharge Summary (Addendum)
Physician Discharge Summary  Kelley Polinsky Zwart PXT:[id number] DOB: 1941-07-04 DOA: 06/10/2014  PCP: Kandice Hams, MD  Admit date: 06/10/2014 Discharge date: 06/12/2014  Time spent: 45 minutes  Recommendations for Outpatient Follow-up:  1. Recheck Hb in one week and perform home hemoccult tests.  2.  Follow up with EGD biopsy results.  Discharge Diagnoses:  Principal Problem:   Microcytic hypochromic anemia Active Problems:   Unspecified hypothyroidism   HYPERTENSION, BENIGN   CAD, ARTERY BYPASS GRAFT   CHF (congestive heart failure)   GERD (gastroesophageal reflux disease)   Type II or unspecified type diabetes mellitus without mention of complication, not stated as uncontrolled   Glaucoma   GI bleed  Discharge Condition: stable   Diet recommendation: Low salt diet  Filed Weights   06/10/14 2224 06/12/14 0623 06/12/14 0847  Weight: 70.1 kg (154 lb 8.7 oz) 70.1 kg (154 lb 8.7 oz) 69.854 kg (154 lb)    History of present illness:  Pt is a 73 yo female with a prior history of CAD and CHF who presents to the ED with the complaint of generalized fatigue, and at least 1 week of chest heaviness and tightness with exertion.  The patient also complains of dizziness, nausea, and craving ice.  She denies melena, BRBPR, emesis, and other stigmata of a GI bleed. The patient admits to being noncompliant with her low salt diet.  She states she has noticed 22 lbs of weight gain within the past 3 weeks.   In the ED, the patient is found to be anemic with Hgb 7.0 and a negative hemoccult.      Hospital Course:  Microcytic anemia -recent subacute blood loss  - Anemia profile confirms iron deficiency anemia. No overt signs of GI bleed. However suspicion for either a recent GI bleed or slow GI bleed. Given symptoms of chest pain/SOB on exertion,transfused 1 unit of PRBC, and start IV Iron. GI consulted,patient underwent EGD/Colonoscopy on 9/16 (see below)   Spoke with cards-Dr McLean-will stop  Plavix and continue ASA.  -CBC post transfusion (9/15) showed Hb of 8.9 . Suspected recent GI Bleed  - Suspected due to iron deficiency anemia, although no clinical signs of GI bleed. Negative fecal occult test in ED.  - GI consulted - endoscopy and colonoscopy performed on 9/16.   - EGD showed a Polypoid gastric lesion with friability could be potential source of patient's chronic anemia.  Small colon polyp was removed on Colonoscopy. Will follow up with GI for biopsy results. Per GI to start Fe supplementation, if no anemia still does not correct, then plans are for a capsule endoscopy at some point in the future  Acute on chronic diastolic CHF  - She has gained weight at home (Subjective 22 lbs weight gain in past 3 weeks.) Possible mild volume overload  - Started on IV Lasix, check daily weights, keep in negative balance - symptoms of DOE have resolved.Will transition to oral Lasix on discharge. Patient more compensated by day of discharge.  - Last Echo on 06/14-EF 55-60%   Chest Pain  -known CAD-likely angina secondary to worsening anemia. Currently without chest pain  -Transfuse one unit PRBC,per cards note Cardiolite in 5/15 was low risk-reassuring  -symptoms resolved post transfusion   Hx of Coronary Artery Disease-S/P CABG, s/p PCI with DES 05/2013  - see above -re chest pain  -spoke with cards-Dr McLean-will stop Plavix and continue ASA,Imdur, Statins and Coreg   GERD  - Stable. Continue PPI.   Diabetes  -  CBG's stable with with SSI,resume Metformin on discharge  Glaucoma  - Stable. Continue Lotemax and Timoptic.   Hypothyroidism  - Clinically stable. Continue Synthroid.   Hypertension  - BP well controlled. Continue Norvasc, Coreg, Lasix.  Procedures: Endoscopy, and Colonoscopy  Consultations:  Gastroenterology, Cardiology  Discharge Exam: Filed Vitals:   06/12/14 1110  BP: 157/62  Pulse: 64  Temp: 97.3 F (36.3 C)  Resp: 20    General: Well developed,  well nourished elderly female in NAD. Appears stated age.  HEENT: Anicteic Sclera, MMM. No pharyngeal erythema or exudates  Neck: Supple, no JVD, no visible masses  Cardiovascular: RRR, S1 S2 auscultated, no rubs, murmurs or gallops. Vertical scar in sternal area noted. Respiratory: Diminished breath sounds bilaterally with equal chest rise  Abdomen: Soft, nontender, nondistended, + bowel sounds  Extremities: lower extremities warm dry without cyanosis clubbing or edema. 2+ DP and PT pulses.  Psych: Normal affect and demeanor with intact judgement and insight    Discharge Instructions Discharge Instructions   Call MD for:  difficulty breathing, headache or visual disturbances    Complete by:  As directed      Call MD for:  extreme fatigue    Complete by:  As directed      Call MD for:  persistant dizziness or light-headedness    Complete by:  As directed      Diet - low sodium heart healthy    Complete by:  As directed      Diet Carb Modified    Complete by:  As directed      Increase activity slowly    Complete by:  As directed           Current Discharge Medication List    START taking these medications   Details  ferrous sulfate 325 (65 FE) MG tablet Take 1 tablet (325 mg total) by mouth 2 (two) times daily with a meal. Qty: 90 tablet, Refills: 0      CONTINUE these medications which have CHANGED   Details  furosemide (LASIX) 40 MG tablet Take 40 mg in the morning, 20 mg in evening. Qty: 30 tablet, Refills: 0      CONTINUE these medications which have NOT CHANGED   Details  amLODipine (NORVASC) 5 MG tablet Take 5 mg by mouth daily.    aspirin 81 MG tablet Take 81 mg by mouth daily.    atorvastatin (LIPITOR) 80 MG tablet Take 80 mg by mouth daily.    brimonidine-timolol (COMBIGAN) 0.2-0.5 % ophthalmic solution Place 1 drop into both eyes daily.     carvedilol (COREG) 6.25 MG tablet Take 1 tablet (6.25 mg total) by mouth 2 (two) times daily. Qty: 60 tablet,  Refills: 3   Associated Diagnoses: Chronic diastolic CHF (congestive heart failure); Coronary atherosclerosis of artery bypass graft; Right carotid bruit    ibandronate (BONIVA) 150 MG tablet Take 150 mg by mouth every 30 (thirty) days. Take in the morning with a full glass of water, on an empty stomach, and do not take anything else by mouth or lie down for the next 30 min.    isosorbide mononitrate (IMDUR) 60 MG 24 hr tablet Take 120 mg by mouth 2 (two) times daily. Patient states she takes two tablets twice a day    levothyroxine (SYNTHROID, LEVOTHROID) 100 MCG tablet Take 100 mcg by mouth daily before breakfast.    loteprednol (LOTEMAX) 0.5 % ophthalmic suspension Place 1 drop into both eyes daily.  metFORMIN (GLUCOPHAGE-XR) 500 MG 24 hr tablet Take 500 mg by mouth daily.    nitroGLYCERIN (NITROSTAT) 0.4 MG SL tablet Place 1 tablet (0.4 mg total) under the tongue every 5 (five) minutes as needed for chest pain. Qty: 25 tablet, Refills: 11   Associated Diagnoses: Chronic diastolic CHF (congestive heart failure); Coronary atherosclerosis of artery bypass graft; Right carotid bruit    pantoprazole (PROTONIX) 40 MG tablet TAKE 1 TABLET BY MOUTH DAILY. Qty: 90 tablet, Refills: 2    phenytoin (DILANTIN) 100 MG ER capsule Take 100-200 mg by mouth 2 (two) times daily. Takes 2 tablets in am and 1 tablet in pm    potassium chloride SA (K-DUR,KLOR-CON) 20 MEQ tablet Take 10-20 mEq by mouth daily. Take a 20mg   tablet in the AM and 10mg  tablet in the evening    prednisoLONE sodium phosphate (INFLAMASE FORTE) 1 % ophthalmic solution Place 1 drop into both eyes every other day. Every other day      STOP taking these medications     clopidogrel (PLAVIX) 75 MG tablet        Allergies  Allergen Reactions  . Meperidine Hcl Other (See Comments)    Demerol - mental status changes    . Nitroglycerin Other (See Comments)    Patch and Cream only - blisters  . Morphine Palpitations    Follow-up Information   Follow up with POLITE,RONALD D, MD. Schedule an appointment as soon as possible for a visit in 1 week.   Specialty:  Internal Medicine   Contact information:   [address] [place] [address] [phone]       Follow up with [name], MD. Schedule an appointment as soon as possible for a visit in 1 week.   Specialty:  Cardiology   Contact information:   [address] [place] [address] [phone]       Follow up with Cleotis Nipper, MD. Schedule an appointment as soon as possible for a visit in 1 week.   Specialty:  Gastroenterology   Contact information:   [address], [place] [address] [phone]       The results of significant diagnostics from this hospitalization (including imaging, microbiology, ancillary and laboratory) are listed below for reference.    Significant Diagnostic Studies: Dg Chest 2 View  06/10/2014   CLINICAL DATA:  Midsternal chest pain dizziness and nausea with shortness of breath. History of previous CABG as well as angioplasty and stent placement  EXAM: CHEST  2 VIEW  COMPARISON:  PA and lateral chest of January 22, 2013  FINDINGS: The lungs are well-expanded and clear. The cardiopericardial silhouette is enlarged but stable. The pulmonary vascularity is not engorged. There are 6 intact sternal wires present. There is mild tortuosity of the descending thoracic aorta. There are loops of mildly distended fluid and gas-filled bowel in the upper abdomen. The observed portions of the bony thorax exhibit no acute abnormalities. There is sclerosis of portions of the left humeral head.  IMPRESSION: Stable cardiomegaly without evidence of CHF. There is no evidence of pneumonia.   Electronically Signed   By: David  Martinique   On: 06/10/2014 15:21    Labs: Basic Metabolic Panel:  Recent Labs Lab 06/10/14 1443 06/12/14 0736  NA 139 139  K 3.5* 3.2*  CL 101 104  CO2  23 23  GLUCOSE 121* 80  BUN 10 8  CREATININE 0.87 0.63  CALCIUM 8.3* 8.2*  Liver Function Tests:  Recent Labs Lab 06/10/14 1443  AST 18  ALT 11  ALKPHOS 92  BILITOT 0.3  PROT 7.1  ALBUMIN 3.4*   CBC:  Recent Labs Lab 06/10/14 1443 06/11/14 0627 06/11/14 1554 06/12/14 0736  WBC 6.4 4.5 6.3 6.9  HGB 7.0* 7.3* 8.9* 8.0*  HCT 23.6* 24.2* 29.1* 25.5*  MCV 70.4* 70.3* 71.9* 71.0*  PLT 267 245 239 237   Cardiac Enzymes:  Recent Labs Lab 06/10/14 1730  TROPONINI <0.30   BNP: BNP (last 3 results)  Recent Labs  08/13/13 0952 06/10/14 1730  PROBNP 88.0 858.6*   CBG:  Recent Labs Lab 06/11/14 1717 06/11/14 2125 06/11/14 2204 06/12/14 0735 06/12/14 1304  GLUCAP 118* 62* 98 92 79   Signed:  Keely Reichel, PA-S2 [facility] 06/12/2014, 2:40 PM  Attending Patient was seen, examined,treatment plan was discussed with the Physician extender. I have directly reviewed the clinical findings, lab, imaging studies and management of this patient in detail. I have made the necessary changes to the above noted documentation, and agree with the documentation, as recorded by the Physician extender.  Nena Alexander MD [facility].

## 2014-06-12 NOTE — Transfer of Care (Signed)
Immediate Anesthesia Transfer of Care Note  Patient: Christine Gay  Procedure(s) Performed: Procedure(s): ESOPHAGOGASTRODUODENOSCOPY (EGD) (N/A) COLONOSCOPY (N/A)  Patient Location: PACU  Anesthesia Type:General  Level of Consciousness: awake, alert  and oriented  Airway & Oxygen Therapy: Patient Spontanous Breathing  Post-op Assessment: Report given to PACU RN  Post vital signs: Reviewed and stable  Complications: No apparent anesthesia complications

## 2014-06-12 NOTE — Op Note (Signed)
[facility] [place], [address]   ENDOSCOPY PROCEDURE REPORT  PATIENT: Christine, Gay  MR#: [id number] BIRTHDATE: [birth year] , 73  yrs. old GENDER: Female ENDOSCOPIST:Spero Gunnels, MD REFERRED BY:  Fr. Maudry Mayhew, Dr. Aundra Dubin PROCEDURE DATE:  06/12/2014 PROCEDURE:      upper endoscopy with biopsies ASA CLASS:     3 INDICATIONS:   iron deficiency anemia (hemoglobin 7.0, ferritin 85) in a 73 year old female with Hemoccult negative stool, but on aspirin and Plavix MEDICATION:    general anesthesia with intubation, per anesthesia department TOPICAL ANESTHETIC:  DESCRIPTION OF PROCEDURE:   the nature, purpose, and risks the procedure had been discussed with the patient who provided written consent and was brought from her hospital room to the [facility] cone endoscopy unit. Tylenol was performed. She received sedation by the anesthesia Department, who elected to do general anesthesia with intubation.  The Pentax adult video endoscope was readily passed alongside the patient's endotracheal tube, entering the esophagus under direct vision without difficulty.  In the proximal esophagus, probably about 4 cm below the cricopharyngeus, I encountered a widely patent mucosal ring. It seems to me that this was probably in the proximal thoracic esophagus, not the cervical esophagus.  The remainder of the esophagus was normal, with no hiatal hernia, no evidence of Barrett's esophagus, no distal esophageal ring or stricture, no varices, infection, or neoplasia. No reflux esophagitis or Mallory-Weiss tear was observed.  The stomach was entered. It contained a small clear bilious residual which was suctioned up.  The stomach was significant for the presence of a somewhat verrucous, polypoid lesion measuring about 1.5 cm in size. The lesion was mobile and soft, and somewhat frondy. It did not have the appearance of a typical gastric adenomatous polyp  or fundic gland polyp, but it did not have the appearance of a malignant neoplasm, either. Multiple biopsies were obtained from it. It should be noted that this lesion was oozing blood from contact hemorrhage during the course of the procedure even prior to the biopsy, indicating an element of friability.  The remainder of the stomach was normal, specifically without evidence of aspirin-related gastropathy.  The pylorus, duodenal bulb, and second duodenum looked normal. There was no endoscopic evidence of celiac disease but multiple duodenal biopsies were obtained to help exclude that diagnosis in view of iron deficiency anemia with heme-negative stool.  A retroflexion view of the cardia of the stomach was remarkable.  The scope was removed from the patient, who tolerated the procedure very well.     COMPLICATIONS: None  ENDOSCOPIC IMPRESSION:  1. Polypoid gastric lesion with friability, might account for the patient's chronic anemia, especially in view of its friable character 2. Proximal esophageal ring, widely patent, not felt likely to be a cervical esophageal web as in Plummer-Vinson syndrome 3. Otherwise normal endoscopic evaluation  RECOMMENDATIONS:  1. Await pathology on biopsies of gastric lesion 2. Proceed to colonoscopy to exclude a source of anemia in the lower GI tract    _______________________________ eSigned:  Ronald Lobo, MD 06/12/2014 10:50 AM    PATIENT NAME:  Christine, Gay MR#: [id number]

## 2014-06-12 NOTE — Anesthesia Preprocedure Evaluation (Addendum)
Anesthesia Evaluation  Patient identified by MRN, date of birth, ID band Patient awake    Reviewed: Allergy & Precautions, H&P , NPO status , [name] Chart, lab work & pertinent test results  Airway Mallampati: II TM Distance: >3 FB Neck ROM: Full    Dental  (+) Teeth Intact   Pulmonary  breath sounds clear to auscultation        Cardiovascular hypertension, + angina + CAD, + Past MI, + CABG and +CHF + dysrhythmias     Neuro/Psych Seizures -,     GI/Hepatic GERD-  ,  Endo/Other  diabetes, Type 2Hypothyroidism   Renal/GU      Musculoskeletal  (+) Arthritis -,   Abdominal (+) + obese,   Peds  Hematology  (+) anemia ,   Anesthesia Other Findings   Reproductive/Obstetrics                          Anesthesia Physical Anesthesia Plan  ASA: III  Anesthesia Plan: General   Post-op Pain Management:    Induction: Intravenous  Airway Management Planned: Oral ETT  Additional Equipment:   Intra-op Plan:   Post-operative Plan: Extubation in OR  Informed Consent: I have reviewed the patients History and Physical, chart, labs and discussed the procedure including the risks, benefits and alternatives for the proposed anesthesia with the patient or authorized representative who has indicated his/her understanding and acceptance.   Dental advisory given  Plan Discussed with: CRNA, Anesthesiologist and Surgeon  Anesthesia Plan Comments:        Anesthesia Quick Evaluation

## 2014-06-12 NOTE — Op Note (Signed)
[facility] [place], [address]   COLONOSCOPY PROCEDURE REPORT  PATIENT: Christine, Gay  MR#: [id number] BIRTHDATE: 1941-07-21 , 73  yrs. old GENDER: Female ENDOSCOPIST: Ronald Lobo, MD REFERRED BY:   Dr. Delfina Redwood, Dr. [name] PROCEDURE DATE:  06/12/2014 PROCEDURE:     colonoscopy with polypectomy ASA CLASS:    3 INDICATIONS:  iron deficiency anemia with Hemoccult negative stool in a 73 year old female whose last colonoscopy was 9 years ago by Dr. Howell Rucks MEDICATIONS:    general anesthesia, per anesthesia department  DESCRIPTION OF PROCEDURE: the nature, purpose, and risks of the procedure were reviewed with the patient who provided written consent and was brought from her hospital room to the [facility] cone endoscopy unit, where time out was performed and the patient was intubated by the anesthesia Department. She remained clinically stable throughout the procedure.  this procedure was performed immediately following the patient's upper endoscopy.  The Pentax adult video colonoscope was advanced without significant difficulty, just some mild looping, to the cecum as identified by visualization of the appendiceal orifice and the ileocecal valve. Pullback was then performed. The quality of the prep was excellent and it is felt that all areas were well seen.  This was a normal examination except for a 6 mm polyp at approximately 40 cm from the external anal opening, felt to probably be in the sigmoid region. It was removed by cold snare technique with transient oozing which was observed for a couple of minutes until it appeared to have stopped spontaneously.  No large polyps, cancer, colitis, diverticulosis, or vascular ectasia were noted during this exam.  Retroflexion in the rectum and reinspection and rectum were unremarkable.     COMPLICATIONS: None  ENDOSCOPIC IMPRESSION:  1. Small colon polyp removed as  described above 2. No source of iron deficiency anemia evident on the current exam. Please see endoscopy report; the patient's EGD today showed a friable polypoid lesion which might account for her anemia.  RECOMMENDATIONS:  1.  await pathology on the colon polyp; however, in view of the patient's advanced age and medical comorbidities, and the absence of any significant findings on today's colonoscopy, it is not felt that future routine surveillance colonoscopies are likely to be needed  2.  It is unclear whether the patient's upper endoscopic findings account for her anemia, but rather than putting this patient through a small bowel capsule endoscopy, given the absence of small bowel symptoms, I think it would be reasonable to monitor the patient's hemoglobin and Hemoccult status as an outpatient while on iron supplementation. If there is difficulty maintaining an adequate hemoglobin, especially if she should show evidence of intermittent or persistent heme positivity, I would then consider a small bowel capsule endoscopy.    _______________________________ eSignedRonald Lobo, MD 06/12/2014 11:00 AM     PATIENT NAME:  Christine, Gay MR#: [id number]

## 2014-06-12 NOTE — Progress Notes (Signed)
NURSING PROGRESS NOTE  Christine Gay [id number] Discharge Data: 06/12/2014 6:25 PM Attending Provider: Jonetta Osgood, MD RJJ:OACZYS,AYTKZS D, MD     [name] to be D/C'd Home per MD order.  Discussed with the patient the After Visit Summary and all questions fully answered. All IV's discontinued with no bleeding noted. All belongings returned to patient for patient to take home.   Last Vital Signs:  Blood pressure 107/45, pulse 48, temperature 97.3 F (36.3 C), temperature source Axillary, resp. rate 16, height 5' (1.524 m), weight 69.854 kg (154 lb), SpO2 100.00%.  Discharge Medication List   Medication List    STOP taking these medications       clopidogrel 75 MG tablet  Commonly known as:  PLAVIX      TAKE these medications       amLODipine 5 MG tablet  Commonly known as:  NORVASC  Take 5 mg by mouth daily.     aspirin 81 MG tablet  Take 81 mg by mouth daily.     atorvastatin 80 MG tablet  Commonly known as:  LIPITOR  Take 80 mg by mouth daily.     carvedilol 6.25 MG tablet  Commonly known as:  COREG  Take 1 tablet (6.25 mg total) by mouth 2 (two) times daily.     COMBIGAN 0.2-0.5 % ophthalmic solution  Generic drug:  brimonidine-timolol  Place 1 drop into both eyes daily.     ferrous sulfate 325 (65 FE) MG tablet  Take 1 tablet (325 mg total) by mouth 2 (two) times daily with a meal.     furosemide 40 MG tablet  Commonly known as:  LASIX  Take 40 mg in the morning, 20 mg in evening.     ibandronate 150 MG tablet  Commonly known as:  BONIVA  Take 150 mg by mouth every 30 (thirty) days. Take in the morning with a full glass of water, on an empty stomach, and do not take anything else by mouth or lie down for the next 30 min.     isosorbide mononitrate 60 MG 24 hr tablet  Commonly known as:  IMDUR  Take 120 mg by mouth 2 (two) times daily. Patient states she takes two tablets twice a day     levothyroxine 100 MCG tablet  Commonly known as:   SYNTHROID, LEVOTHROID  Take 100 mcg by mouth daily before breakfast.     LOTEMAX 0.5 % ophthalmic suspension  Generic drug:  loteprednol  Place 1 drop into both eyes daily.     metFORMIN 500 MG 24 hr tablet  Commonly known as:  GLUCOPHAGE-XR  Take 500 mg by mouth daily.     nitroGLYCERIN 0.4 MG SL tablet  Commonly known as:  NITROSTAT  Place 1 tablet (0.4 mg total) under the tongue every 5 (five) minutes as needed for chest pain.     pantoprazole 40 MG tablet  Commonly known as:  PROTONIX  TAKE 1 TABLET BY MOUTH DAILY.     phenytoin 100 MG ER capsule  Commonly known as:  DILANTIN  Take 100-200 mg by mouth 2 (two) times daily. Takes 2 tablets in am and 1 tablet in pm     potassium chloride SA 20 MEQ tablet  Commonly known as:  K-DUR,KLOR-CON  Take 10-20 mEq by mouth daily. Take a 20mg   tablet in the AM and 10mg  tablet in the evening     prednisoLONE sodium phosphate 1 % ophthalmic solution  Commonly known  as:  INFLAMASE FORTE  Place 1 drop into both eyes every other day. Every other day

## 2014-06-13 ENCOUNTER — Encounter (HOSPITAL_COMMUNITY): Payer: Self-pay | Admitting: Gastroenterology

## 2014-06-17 DIAGNOSIS — D509 Iron deficiency anemia, unspecified: Secondary | ICD-10-CM | POA: Diagnosis not present

## 2014-06-24 ENCOUNTER — Encounter: Payer: Medicare Other | Admitting: Cardiology

## 2014-07-09 ENCOUNTER — Ambulatory Visit (INDEPENDENT_AMBULATORY_CARE_PROVIDER_SITE_OTHER): Payer: Medicare Other

## 2014-07-09 DIAGNOSIS — B351 Tinea unguium: Secondary | ICD-10-CM | POA: Diagnosis not present

## 2014-07-09 DIAGNOSIS — E1140 Type 2 diabetes mellitus with diabetic neuropathy, unspecified: Secondary | ICD-10-CM

## 2014-07-09 DIAGNOSIS — M79673 Pain in unspecified foot: Secondary | ICD-10-CM

## 2014-07-09 DIAGNOSIS — Q828 Other specified congenital malformations of skin: Secondary | ICD-10-CM | POA: Diagnosis not present

## 2014-07-09 NOTE — Progress Notes (Signed)
   Subjective:    Patient ID: Christine Gay, female    DOB: [birth year], 73 y.o.   MRN: [id number]  HPI Pt presents for nail debridement   Review of Systems no new findings or systemic changes noted    Objective:   Physical Exam Neurovascular status is intact pedal pulses are palpable DP postal for bilateral PT thready nonpalpable bilateral Refill time 3 seconds plus one +2 edema bilateral neurologically epicritic and proprioceptive sensations diminished on Semmes Weinstein to forefoot digits and arch. There is normal plantar response DTRs not listed dermatologic the skin color pigment normal hair growth absent nails thick brittle crumbly friable dystrophic 1 through 5 bilateral also keratoses sub-fifth bilateral tender and painful on palpation with enclosed shoe wear wearing appropriate shoes. No open wounds or ulcers no infections at this time.       Assessment & Plan:  Assessment this time is diabetes with peripheral neuropathy and complications multiple keratoses sub-5 bilateral debridement at this time also debrided nails 1 through 5 bilateral thick and brittle, dystrophic and friable tender to touch or palpation no open wounds no ulcers no secondary infections nails debrided as well as multiple keratoses debridement this time we'll check into diabetic shoe authorization when available.  Jaleyah Masson DPM

## 2014-07-15 DIAGNOSIS — Z23 Encounter for immunization: Secondary | ICD-10-CM | POA: Diagnosis not present

## 2014-07-15 DIAGNOSIS — D62 Acute posthemorrhagic anemia: Secondary | ICD-10-CM | POA: Diagnosis not present

## 2014-07-15 DIAGNOSIS — R42 Dizziness and giddiness: Secondary | ICD-10-CM | POA: Diagnosis not present

## 2014-07-29 ENCOUNTER — Encounter: Payer: Self-pay | Admitting: Physician Assistant

## 2014-07-29 ENCOUNTER — Ambulatory Visit (INDEPENDENT_AMBULATORY_CARE_PROVIDER_SITE_OTHER): Payer: Medicare Other | Admitting: Physician Assistant

## 2014-07-29 VITALS — BP 110/54 | HR 58 | Ht 61.0 in | Wt 153.0 lb

## 2014-07-29 DIAGNOSIS — I25810 Atherosclerosis of coronary artery bypass graft(s) without angina pectoris: Secondary | ICD-10-CM | POA: Diagnosis not present

## 2014-07-29 DIAGNOSIS — I5032 Chronic diastolic (congestive) heart failure: Secondary | ICD-10-CM | POA: Diagnosis not present

## 2014-07-29 DIAGNOSIS — R42 Dizziness and giddiness: Secondary | ICD-10-CM | POA: Diagnosis not present

## 2014-07-29 DIAGNOSIS — I10 Essential (primary) hypertension: Secondary | ICD-10-CM | POA: Diagnosis not present

## 2014-07-29 NOTE — Progress Notes (Signed)
Christine Gay is a 73 year old female patient of Dr. Aundra Dubin has history of CAD status post CABG followed by a drug-eluting stent to the ostial circumflex in 04/2013. LIMA to the LAD was patent, SVG to the OM1 was occluded. Lexi scan Myoview May 2015 was negative for ischemia.  Patient was recently hospitalized with chest pain on exertion and was found to have microcytic anemia with hemoglobin of 7.0,without overt signs of GI bleed. She was transfused one unit of PRBCs and started on iron.Plavix and aspirin were stopped.she had a polypoid lesion on endoscopy that could account for anemia.She also complained of a  22 pound weight gain in 3 weeks with possible mild volume overload treated with IV Lasix.   Patient comes in today still complaining of stomach pains, dizziness at rest and when she changes positions and not feeling well. She is still taking her Plavix and aspirin despite being told to stop the Plavix. Her blood count has been checked regularly by Dr. Delfina Redwood. He also stopped her Norvasc and evening dose of Lasix because of the dizziness.  Allergies  Allergen Reactions  . Meperidine Hcl Other (See Comments)    Demerol - mental status changes    . Nitroglycerin Other (See Comments)    Patch and Cream only - blisters  . Morphine Palpitations     Current Outpatient Prescriptions  Medication Sig Dispense Refill  . amLODipine (NORVASC) 5 MG tablet Take 5 mg by mouth daily.    Marland Kitchen aspirin 81 MG tablet Take 81 mg by mouth daily.    Marland Kitchen atorvastatin (LIPITOR) 80 MG tablet Take 80 mg by mouth daily.    . brimonidine-timolol (COMBIGAN) 0.2-0.5 % ophthalmic solution Place 1 drop into both eyes daily.     . carvedilol (COREG) 6.25 MG tablet Take 1 tablet (6.25 mg total) by mouth 2 (two) times daily. 60 tablet 3  . clopidogrel (PLAVIX) 75 MG tablet Take 75 mg by mouth daily.    . ferrous sulfate 325 (65 FE) MG tablet Take 1 tablet (325 mg total) by mouth 2 (two) times daily with a meal. 90  tablet 0  . fluorometholone (FML) 0.1 % ophthalmic suspension   6  . furosemide (LASIX) 40 MG tablet Take 40 mg in the morning, 20 mg in evening. 30 tablet 0  . isosorbide mononitrate (IMDUR) 60 MG 24 hr tablet Take 120 mg by mouth 2 (two) times daily. Patient states she takes two tablets twice a day    . levothyroxine (SYNTHROID, LEVOTHROID) 100 MCG tablet Take 100 mcg by mouth daily before breakfast.    . loteprednol (LOTEMAX) 0.5 % ophthalmic suspension Place 1 drop into both eyes daily.     . metFORMIN (GLUCOPHAGE-XR) 500 MG 24 hr tablet Take 500 mg by mouth daily.    . nitroGLYCERIN (NITROSTAT) 0.4 MG SL tablet Place 1 tablet (0.4 mg total) under the tongue every 5 (five) minutes as needed for chest pain. 25 tablet 11  . pantoprazole (PROTONIX) 40 MG tablet TAKE 1 TABLET BY MOUTH DAILY. 90 tablet 2  . phenytoin (DILANTIN) 100 MG ER capsule Take 100-200 mg by mouth 2 (two) times daily. Takes 2 tablets in am and 1 tablet in pm    . potassium chloride SA (K-DUR,KLOR-CON) 20 MEQ tablet Take 10-20 mEq by mouth daily. Take a 20mg   tablet in the AM and 10mg  tablet in the evening    . prednisoLONE sodium phosphate (INFLAMASE FORTE) 1 % ophthalmic solution Place 1 drop  into both eyes every other day. Every other day     No current facility-administered medications for this visit.    Past Medical History  Diagnosis Date  . CAD (coronary artery disease)     a. s/p CABG 1992, b. s/p PCI several years ago; c. LHC 11/08: L-LAD ok, S-OM1 ok, S-OM2 CTO, prox to mid CFX 70% (unchanged), RCA non-dominant, occluded;  d.  Echo 2/11: EF 55%, mod LVH, Ao sclerosis;  e. Echo 6/14: mild LVH, mild focal basal sept hypertrophy, EF 55-65%, inf HK, Mild AI, mild MR, mild BAE;  f. Myoview 7/14: int risk-> cath 04/2013 s/p PTCA/DES to prox Cx 05/04/13.  . Obesity   . GERD (gastroesophageal reflux disease)   . LBBB (left bundle branch block)     chronic  . Glaucoma   . Hypothyroidism   . Hypertension   . Chronic  diastolic CHF (congestive heart failure)   . High cholesterol   . Anginal pain   . Myocardial infarction 03/1991 X 2; 07/1991  . Type II diabetes mellitus   . Anemia     a. Noted on labs 04/2013.  . Sickle cell trait   . Seizures     "I'm on Dilantin" (06/10/2014)  . Arthritis     "legs" (06/10/2014)    Past Surgical History  Procedure Laterality Date  . Cataract extraction w/ intraocular lens  implant, bilateral Bilateral   . Cholecystectomy    . Tubal ligation Bilateral 1969  . Coronary angioplasty with stent placement  ? date; 05/04/2013    ? location; DES  to [place]  . Cardiac catheterization      "several;  they went up to check"  . Coronary artery bypass graft  07/1991    "CABG X 3"  . Esophagogastroduodenoscopy N/A 06/12/2014    Procedure: ESOPHAGOGASTRODUODENOSCOPY (EGD);  Surgeon: Cleotis Nipper, MD;  Location: [facility] ENDOSCOPY;  Service: Endoscopy;  Laterality: N/A;  . Colonoscopy N/A 06/12/2014    Procedure: COLONOSCOPY;  Surgeon: Cleotis Nipper, MD;  Location: [facility] ENDOSCOPY;  Service: Endoscopy;  Laterality: N/A;    Family History  Problem Relation Age of Onset  . Diabetes Mother     History   Social History  . Marital Status: Married    Spouse Name: N/A    Number of Children: N/A  . Years of Education: N/A   Occupational History  . Not on file.   Social History Main Topics  . Smoking status: Never Smoker   . Smokeless tobacco: Never Used  . Alcohol Use: No  . Drug Use: No  . Sexual Activity: No   Other Topics Concern  . Not on file   Social History Narrative    ROS:see history of present illness otherwise negative  Ht 5\' 1"  (1.549 m)  Wt 153 lb (69.4 kg)  BMI 28.92 kg/m2  PHYSICAL EXAM: Well-nournished, in no acute distress. Neck: No JVD, HJR, Bruit, or thyroid enlargement  Lungs: No tachypnea, clear without wheezing, rales, or rhonchi  Cardiovascular: RRR, PMI not displaced, Normal S1 and S2, 2/6 systolic murmur at the left sternal  border, no gallops, bruit, thrill, or heave.  Abdomen: BS normal. Soft without organomegaly, masses, lesions or tenderness.  Extremities: without cyanosis, clubbing or edema. Good distal pulses bilateral  SKin: Warm, no lesions or rashes   Musculoskeletal: No deformities  Neuro: no focal signs   Wt Readings from Last 3 Encounters:  07/29/14 153 lb (69.4 kg)  06/12/14 154 lb (69.854 kg)  03/06/14 156 lb 12.8 oz (71.124 kg)    Lab Results  Component Value Date   WBC 6.9 06/12/2014   HGB 8.0* 06/12/2014   HCT 25.5* 06/12/2014   PLT 237 06/12/2014   GLUCOSE 80 06/12/2014   CHOL 161 03/13/2013   TRIG 61.0 03/13/2013   HDL 69.30 03/13/2013   LDLCALC 80 03/13/2013   ALT 11 06/10/2014   AST 18 06/10/2014   NA 139 06/12/2014   K 3.2* 06/12/2014   CL 104 06/12/2014   CREATININE 0.63 06/12/2014   BUN 8 06/12/2014   CO2 23 06/12/2014   TSH 6.38* 09/11/2008   INR 1.22 06/10/2014   HGBA1C * 03/30/2007    6.2 (NOTE)   The ADA recommends the following therapeutic goals for glycemic   control related to Hgb A1C measurement:   Goal of Therapy:   < 7.0% Hgb A1C   Action Suggested:  > 8.0% Hgb A1C   Ref:  Diabetes Care, 22, Suppl. 1, 1999    EKG:not done  Colonoscopy ENDOSCOPIC IMPRESSION: 1. Small colon polyp removed as described above 2. No source of iron deficiency anemia evident on the current exam. Please see endoscopy report; the patient's EGD today showed a friable polypoid lesion which might account for her anemia. RECOMMENDATIONS: 1. await pathology on the colon polyp; however, in view of the patient's advanced age and medical comorbidities, and the absence of any significant findings on today's colonoscopy, it is not felt that future routine surveillance colonoscopies are likely to be needed 2. It is unclear whether the patient's upper endoscopic findings account for her anemia, but rather than putting this patient through a small bowel capsule endoscopy, given the  absence of small bowel symptoms, I think it would be reasonable to monitor the patient's hemoglobin and Hemoccult status as an outpatient while on iron supplementation. If there is difficulty maintaining an adequate hemoglobin, especially if she should show evidence of intermittent or persistent heme positivity, I would then consider a small bowel capsule endoscopy.  Endoscopy ENDOSCOPIC IMPRESSION: 1. Polypoid gastric lesion with friability, might account for the patient's chronic anemia, especially in view of its friable character 2. Proximal esophageal ring, widely patent, not felt likely to be a cervical esophageal web as in Plummer-Vinson syndrome 3. Otherwise normal endoscopic evaluation RECOMMENDATIONS: 1. Await pathology on biopsies of gastric lesion 2. Proceed to colonoscopy to exclude a source of anemia in the lower GI tract Page

## 2014-07-29 NOTE — Assessment & Plan Note (Signed)
Patient has no evidence of heart failure and exam today. She is only taking Lasix 40 mg in the morning. Her 20 mg in the afternoon has been held by Dr. Delfina Redwood because of dizziness.

## 2014-07-29 NOTE — Assessment & Plan Note (Signed)
Patient had drug-eluting stent the ostial circumflex in 04/2013. She had a recent admission with chest pain in the setting of anemia with a hemoglobin of 7.0. She was transfused was found to have a polypoid lesion on endoscopy. Plavix was supposed to be stopped and she continues to take it. Her chest pain resolved with the transfusion. She had an negative Lexi scan Myoview in may of this year so further cardiac workup was not indicated. I will stop her Plavix and have her followup with Dr. Algernon Huxley.

## 2014-07-29 NOTE — Patient Instructions (Addendum)
Your physician recommends that you schedule a follow-up appointment in: about 2 months with Dr. Aundra Dubin  Your physician has recommended you make the following change in your medication:  Stop Clopidogrel.

## 2014-07-29 NOTE — Assessment & Plan Note (Signed)
Blood pressure is stable 

## 2014-07-29 NOTE — Assessment & Plan Note (Signed)
Patient still complains of dizziness. She is not orthostatic in the office today. I believe some of it may be due to to ongoing anemia. This is being followed closely by Dr. Delfina Redwood. Recommend stopping Plavix. Take her iron and aspirin with food. Followup with Dr. Delfina Redwood and Dr. Cristina Gong.

## 2014-08-01 ENCOUNTER — Other Ambulatory Visit: Payer: Self-pay | Admitting: Cardiology

## 2014-08-02 ENCOUNTER — Other Ambulatory Visit: Payer: Self-pay

## 2014-08-02 ENCOUNTER — Other Ambulatory Visit: Payer: Self-pay | Admitting: Cardiology

## 2014-08-14 DIAGNOSIS — E11329 Type 2 diabetes mellitus with mild nonproliferative diabetic retinopathy without macular edema: Secondary | ICD-10-CM | POA: Diagnosis not present

## 2014-08-14 DIAGNOSIS — H4011X2 Primary open-angle glaucoma, moderate stage: Secondary | ICD-10-CM | POA: Diagnosis not present

## 2014-08-14 DIAGNOSIS — H35369 Drusen (degenerative) of macula, unspecified eye: Secondary | ICD-10-CM | POA: Diagnosis not present

## 2014-08-21 ENCOUNTER — Other Ambulatory Visit: Payer: Self-pay | Admitting: Cardiology

## 2014-08-23 ENCOUNTER — Other Ambulatory Visit: Payer: Self-pay | Admitting: Cardiology

## 2014-09-05 ENCOUNTER — Encounter (HOSPITAL_COMMUNITY): Payer: Self-pay | Admitting: Cardiology

## 2014-09-25 ENCOUNTER — Telehealth: Payer: Self-pay | Admitting: Cardiology

## 2014-09-25 NOTE — Telephone Encounter (Signed)
New message      Talk to the nurse regarding if she can have a massage.  She received a massage for a gift.

## 2014-09-25 NOTE — Telephone Encounter (Signed)
Called patient to see what kind of massage she received.  Patient stated "I received an electric foot massage a year ago and the doctor did not want me to use it. Now I have this battery operated foot massage. Can I use it?" Informed patient that if the doctor did not want her to use an electric foot massage, then a battery operated should not be used either. Patient was agreeable to this plan and verbalized understanding.

## 2014-10-10 ENCOUNTER — Encounter: Payer: Self-pay | Admitting: Cardiology

## 2014-10-10 ENCOUNTER — Ambulatory Visit (INDEPENDENT_AMBULATORY_CARE_PROVIDER_SITE_OTHER): Payer: Medicare Other | Admitting: Cardiology

## 2014-10-10 VITALS — BP 111/66 | HR 49 | Ht 61.0 in | Wt 157.4 lb

## 2014-10-10 DIAGNOSIS — I25700 Atherosclerosis of coronary artery bypass graft(s), unspecified, with unstable angina pectoris: Secondary | ICD-10-CM

## 2014-10-10 DIAGNOSIS — E780 Pure hypercholesterolemia: Secondary | ICD-10-CM | POA: Diagnosis not present

## 2014-10-10 DIAGNOSIS — I5032 Chronic diastolic (congestive) heart failure: Secondary | ICD-10-CM

## 2014-10-10 DIAGNOSIS — I25709 Atherosclerosis of coronary artery bypass graft(s), unspecified, with unspecified angina pectoris: Secondary | ICD-10-CM | POA: Diagnosis not present

## 2014-10-10 DIAGNOSIS — R0602 Shortness of breath: Secondary | ICD-10-CM | POA: Diagnosis not present

## 2014-10-10 DIAGNOSIS — I10 Essential (primary) hypertension: Secondary | ICD-10-CM | POA: Diagnosis not present

## 2014-10-10 LAB — LIPID PANEL
CHOL/HDL RATIO: 3
CHOLESTEROL: 159 mg/dL (ref 0–200)
HDL: 52.3 mg/dL (ref >39.00)
LDL CALC: 95 mg/dL (ref 0–99)
NONHDL: 106.7
Triglycerides: 58 mg/dL (ref 0.0–149.0)
VLDL: 11.6 mg/dL (ref 0.0–40.0)

## 2014-10-10 LAB — CBC WITH DIFFERENTIAL/PLATELET
Basophils Absolute: 0 10*3/uL (ref 0.0–0.1)
Basophils Relative: 0.5 % (ref 0.0–3.0)
Eosinophils Absolute: 0.2 10*3/uL (ref 0.0–0.7)
Eosinophils Relative: 2.5 % (ref 0.0–5.0)
HCT: 36.7 % (ref 36.0–46.0)
Hemoglobin: 11.9 g/dL — ABNORMAL LOW (ref 12.0–15.0)
LYMPHS PCT: 16.5 % (ref 12.0–46.0)
Lymphs Abs: 1.6 10*3/uL (ref 0.7–4.0)
MCHC: 32.3 g/dL (ref 30.0–36.0)
MCV: 100.2 fl — ABNORMAL HIGH (ref 78.0–100.0)
MONO ABS: 0.9 10*3/uL (ref 0.1–1.0)
MONOS PCT: 8.8 % (ref 3.0–12.0)
NEUTROS ABS: 6.9 10*3/uL (ref 1.4–7.7)
NEUTROS PCT: 71.7 % (ref 43.0–77.0)
Platelets: 188 10*3/uL (ref 150.0–400.0)
RBC: 3.66 Mil/uL — AB (ref 3.87–5.11)
RDW: 12.4 % (ref 11.5–15.5)
WBC: 9.7 10*3/uL (ref 4.0–10.5)

## 2014-10-10 LAB — BASIC METABOLIC PANEL
BUN: 10 mg/dL (ref 6–23)
CALCIUM: 8.3 mg/dL — AB (ref 8.4–10.5)
CO2: 28 mEq/L (ref 19–32)
Chloride: 104 mEq/L (ref 96–112)
Creatinine, Ser: 0.76 mg/dL (ref 0.40–1.20)
GFR: 95.73 mL/min (ref >60.00)
GLUCOSE: 109 mg/dL — AB (ref 70–99)
Potassium: 4.1 mEq/L (ref 3.5–5.1)
Sodium: 136 mEq/L (ref 135–145)

## 2014-10-10 LAB — BRAIN NATRIURETIC PEPTIDE: Pro B Natriuretic peptide (BNP): 189 pg/mL — ABNORMAL HIGH (ref 0.0–100.0)

## 2014-10-10 MED ORDER — FUROSEMIDE 40 MG PO TABS: 40.0000 mg | ORAL_TABLET | Freq: Every day | ORAL | Status: DC

## 2014-10-10 NOTE — Patient Instructions (Signed)
Increase lasix (furosemide) to 40mg  daily.   Your physician recommends that you have lab work --BMET/BNP/Lipid profile/CBCd  Your physician recommends that you return for lab work in: 2 weeks--BMET.  Your physician recommends that you schedule a follow-up appointment in: 1 month with Dr Aundra Dubin.

## 2014-10-10 NOTE — Progress Notes (Signed)
Patient ID: Christine Gay, female   DOB: 23-Mar-1941, 74 y.o.   MRN: [id number] PCP: Dr. Delfina Redwood  74 yo with history of CAD s/p CABG, DM, and HTN presents for cardiology followup.  She had a Lexiscan Cardiolite in 2014 that was an intermediate risk study and echo showed basal to mid inferior hypokinesis with EF 55-60%.  I did a cardaic catheterization in 8/14 that showed patent LIMA-LAD, occluded SVG-OM1, and 95% ostial LCx stenosis.  She had PCI to ostial LCx with Promus DES.  Cardiolite in 5/15 showed no ischemia or infarction.  She had an upper GI bleed in 9/15, she is now off Plavix.   No melena or BRBPR.  She has frequent lightheaded episodes.  Some seem orthostatic, others are more with head movement, suggestive of positional vertigo.  She also has problems with balance and has to walk with a cane.  She is currently taking Lasix 20 mg daily.  She has less chest pain than prior. She has occasional central, lower chest aches.  This usually occurs with walking and resolves with NTG. She is short of breath after walking 20-30 feet.    Labs (3/13): K 4.2, creatinine 0.77 Labs (6/14): LDL 80, HDL 69 Labs (8/14): BNP 971 => 420 => 138, creatinine 0.8, K 4.3 Labs (11/14): K 4.1, creatinine 0.8, BNP 88 Labs (3/15): K 3.4, creatinine 0.86, LDL 85 Labs (9/15): K 3.2, creatinine 0.63, hgb 8  PMH: 1. GERD 2. Seizure disorder 3. Type II diabetes 4. Chronic LBBB 5. CAD: s/p CABG in 1992, had PCI several years ago.  Echo (2/11) with EF 55%, moderate LVH, aortic sclerosis.  LHC (8/14) with patent LIMA-LAD, totally occluded SVG-OM1, 95% ostial LCx, totally occluded small nondominant RCA with EF 55%.  Patient had Promus DES to pLCx. Lexiscan Cardiolite (5/15) with EF 57%, mild breast attenuation, no ischemia or infarction.  6. Glaucoma 7. Hypothyroidism 8. HTN 9. Cholecystectomy 10. Diastolic CHF: Echo (1/95) with EF 55-60%, basal to mid inferior hypokinesis, mild MR and mild AI. 11. Carotid stenosis:  Carotid dopplers (6/15) with 40-59% bilateral ICA stenosis.  12. GI bleed 9/15: EGD showed a polypoid gastric lesion that was friable, benign pathology.   SH: Widow, lives in [place], does not smoke.   FH: No premature CAD.  Diabetes.   ROS: All systems reviewed and negative except as per HPI.   Current Outpatient Prescriptions  Medication Sig Dispense Refill  . aspirin 81 MG tablet Take 81 mg by mouth daily.    Marland Kitchen atorvastatin (LIPITOR) 80 MG tablet Take 80 mg by mouth daily.    . brimonidine-timolol (COMBIGAN) 0.2-0.5 % ophthalmic solution Place 1 drop into both eyes daily.     . ferrous sulfate 325 (65 FE) MG tablet Take 1 tablet (325 mg total) by mouth 2 (two) times daily with a meal. 90 tablet 0  . fluorometholone (FML) 0.1 % ophthalmic suspension   6  . isosorbide mononitrate (IMDUR) 60 MG 24 hr tablet TAKE 2 TABLETS BY MOUTH 2 TIMES DAILY. 360 tablet 0  . levothyroxine (SYNTHROID, LEVOTHROID) 100 MCG tablet Take 100 mcg by mouth daily before breakfast.    . loteprednol (LOTEMAX) 0.5 % ophthalmic suspension Place 1 drop into both eyes daily.     . metFORMIN (GLUCOPHAGE-XR) 500 MG 24 hr tablet Take 500 mg by mouth 2 (two) times daily. 500 mg by mouth twice daily    . nitroGLYCERIN (NITROSTAT) 0.4 MG SL tablet Place 1 tablet (0.4 mg total) under the  tongue every 5 (five) minutes as needed for chest pain. 25 tablet 11  . pantoprazole (PROTONIX) 40 MG tablet TAKE 1 TABLET BY MOUTH DAILY. 90 tablet 2  . phenytoin (DILANTIN) 100 MG ER capsule Take 100 mg by mouth 2 (two) times daily. Two tablets by mouth twice daily    . potassium chloride SA (K-DUR,KLOR-CON) 20 MEQ tablet TAKE 1 TABLET BY MOUTH EVERY MORNING AND 1/2 TABLET EVERY EVENING. 135 tablet 0  . prednisoLONE sodium phosphate (INFLAMASE FORTE) 1 % ophthalmic solution Place 1 drop into both eyes every other day. Every other day    . carvedilol (COREG) 6.25 MG tablet Take 1 tablet (6.25 mg total) by mouth 2 (two) times daily.    .  furosemide (LASIX) 40 MG tablet Take 1 tablet (40 mg total) by mouth daily. 30 tablet 6   No current facility-administered medications for this visit.    BP 111/66 mmHg  Pulse 49  Ht 5\' 1"  (1.549 m)  Wt 157 lb 6.4 oz (71.396 kg)  BMI 29.76 kg/m2  SpO2 96% General: NAD Neck: JVP 8 cm, no thyromegaly or thyroid nodule.  Lungs: Clear to auscultation bilaterally with normal respiratory effort. CV: Nondisplaced PMI.  Heart regular S1/S2, no S3/S4, 1/6 early SEM.  1+ ankle edema bilaterally.  Right carotid bruit.   Abdomen: Soft, nontender, no hepatosplenomegaly, no distention.  Skin: Intact without lesions or rashes.  Neurologic: Alert and oriented x 3.  Psych: Normal affect. Extremities: No clubbing or cyanosis.   Assessment/Plan: 1. CAD: Status post Promus DES to ostial LCx in 8/14.  Chest pain improved with PCI but is still present chronically (chronic stable angina).  Lexiscan Cardiolite in 5/15 showed no ischemia or infarction. .  - She is on maximal Imdur (120 mg) already.  I will continue Coreg 6.25 mg bid.  - She is not a ranolazine candidate given use of Dilantin.  2. Diastolic CHF: EF 27-03% on last echo.  NYHA class III symptoms with volume overload.  - Increase Lasix to 40 mg daily with BMET in 2 week. BMET today.  3. Hyperlipidemia: Continue statin with known CAD.  Check lipids today.  4. Carotid bruit: Right carotid bruit, I will arrange for carotid dopplers. 5. "Dizziness": Probably multifactorial.   Suspect imbalance perhaps from diabetic neuropathy likely plays a roll.  Orthostatics were normal when checked.  I will have him wear graded compression stockings.    Followup 4 wks.   [name] 10/10/2014

## 2014-10-15 ENCOUNTER — Ambulatory Visit (INDEPENDENT_AMBULATORY_CARE_PROVIDER_SITE_OTHER): Payer: Medicare Other

## 2014-10-15 DIAGNOSIS — M79673 Pain in unspecified foot: Secondary | ICD-10-CM | POA: Diagnosis not present

## 2014-10-15 DIAGNOSIS — B351 Tinea unguium: Secondary | ICD-10-CM | POA: Diagnosis not present

## 2014-10-15 DIAGNOSIS — Q828 Other specified congenital malformations of skin: Secondary | ICD-10-CM

## 2014-10-15 DIAGNOSIS — E1140 Type 2 diabetes mellitus with diabetic neuropathy, unspecified: Secondary | ICD-10-CM

## 2014-10-15 NOTE — Progress Notes (Signed)
   Subjective:    Patient ID: Christine Gay, female    DOB: [birth year], 74 y.o.   MRN: [id number]  HPI  Pt presents for nail debridement  Review of Systems no new findings or systemic changes noted     Objective:   Physical Exam  Lower extremity objective findings unchanged pedal pulses DP +2 PT 1 over 4 bilateral capillary refill time 3 seconds there is mild edema noted bilateral no open wounds no ulcers no secondary infections nails thick brittle crumbly friable 1 through 5 bilateral. Patient does have keratoses sub-first bilateral and fifth bilateral no open wounds no ulcers no secondary infection is noted at this time. Mild digital contractures noted. Nails brittle crumbly friable and discolored      Assessment & Plan:  Assessment diabetes with complications peripheral neuropathy and angiopathy. Multiple keratoses sub-15 bilateral are debrided also debridement at this time thick brittle dystrophic frontal mycotic nails 1 through 5 bilateral return for future diabetic foot palliative nail care every 3 months as recommended next  Bahja Masson DPM

## 2014-10-21 ENCOUNTER — Telehealth: Payer: Self-pay | Admitting: Physician Assistant

## 2014-10-21 NOTE — Telephone Encounter (Signed)
Patient called because she was having intermittent elbow pain today and was concerned about it. The pain is in her left elbow. It is intermittent, and not clearly associated with exertion. She wondered if this pain could be related to her heart.  She has a long history of CAD and her cardiac symptoms have been chest pain that sometimes radiated down her left arm.  Advised the patient that if she was not having chest pain, or shortness of breath, I did not feel strongly that her elbow pain was related to her heart. I advised her that if she felt like she needed evaluation, she should come to the emergency room. If not, she can call her primary care physician in the morning and see about getting an evaluation. She is in agreement with this as a plan of care.  Rosaria Ferries, PA-C 10/21/2014 6:40 PM Beeper [phone]

## 2014-10-23 ENCOUNTER — Other Ambulatory Visit: Payer: Self-pay | Admitting: *Deleted

## 2014-10-23 DIAGNOSIS — E7800 Pure hypercholesterolemia, unspecified: Secondary | ICD-10-CM

## 2014-10-23 MED ORDER — ROSUVASTATIN CALCIUM 40 MG PO TABS: 40.0000 mg | ORAL_TABLET | Freq: Every day | ORAL | Status: DC

## 2014-10-30 ENCOUNTER — Other Ambulatory Visit (INDEPENDENT_AMBULATORY_CARE_PROVIDER_SITE_OTHER): Payer: Medicare Other | Admitting: *Deleted

## 2014-10-30 DIAGNOSIS — I5032 Chronic diastolic (congestive) heart failure: Secondary | ICD-10-CM

## 2014-10-30 DIAGNOSIS — I25709 Atherosclerosis of coronary artery bypass graft(s), unspecified, with unspecified angina pectoris: Secondary | ICD-10-CM

## 2014-10-30 DIAGNOSIS — R0602 Shortness of breath: Secondary | ICD-10-CM

## 2014-10-30 LAB — BASIC METABOLIC PANEL WITH GFR
BUN: 10 mg/dL (ref 6–23)
CO2: 32 meq/L (ref 19–32)
Calcium: 8.6 mg/dL (ref 8.4–10.5)
Chloride: 105 meq/L (ref 96–112)
Creatinine, Ser: 0.89 mg/dL (ref 0.40–1.20)
GFR: 79.77 mL/min
Glucose, Bld: 101 mg/dL — ABNORMAL HIGH (ref 70–99)
Potassium: 4.2 meq/L (ref 3.5–5.1)
Sodium: 140 meq/L (ref 135–145)

## 2014-11-04 DIAGNOSIS — Z6830 Body mass index (BMI) 30.0-30.9, adult: Secondary | ICD-10-CM | POA: Diagnosis not present

## 2014-11-04 DIAGNOSIS — R569 Unspecified convulsions: Secondary | ICD-10-CM | POA: Diagnosis not present

## 2014-11-04 DIAGNOSIS — D649 Anemia, unspecified: Secondary | ICD-10-CM | POA: Diagnosis not present

## 2014-11-04 DIAGNOSIS — E663 Overweight: Secondary | ICD-10-CM | POA: Diagnosis not present

## 2014-11-04 DIAGNOSIS — I2510 Atherosclerotic heart disease of native coronary artery without angina pectoris: Secondary | ICD-10-CM | POA: Diagnosis not present

## 2014-11-04 DIAGNOSIS — Z1389 Encounter for screening for other disorder: Secondary | ICD-10-CM | POA: Diagnosis not present

## 2014-11-04 DIAGNOSIS — E039 Hypothyroidism, unspecified: Secondary | ICD-10-CM | POA: Diagnosis not present

## 2014-11-04 DIAGNOSIS — E782 Mixed hyperlipidemia: Secondary | ICD-10-CM | POA: Diagnosis not present

## 2014-11-04 DIAGNOSIS — M8580 Other specified disorders of bone density and structure, unspecified site: Secondary | ICD-10-CM | POA: Diagnosis not present

## 2014-11-04 DIAGNOSIS — Z0000 Encounter for general adult medical examination without abnormal findings: Secondary | ICD-10-CM | POA: Diagnosis not present

## 2014-11-04 DIAGNOSIS — I10 Essential (primary) hypertension: Secondary | ICD-10-CM | POA: Diagnosis not present

## 2014-11-04 DIAGNOSIS — M810 Age-related osteoporosis without current pathological fracture: Secondary | ICD-10-CM | POA: Diagnosis not present

## 2014-11-04 DIAGNOSIS — E1122 Type 2 diabetes mellitus with diabetic chronic kidney disease: Secondary | ICD-10-CM | POA: Diagnosis not present

## 2014-11-11 ENCOUNTER — Ambulatory Visit (INDEPENDENT_AMBULATORY_CARE_PROVIDER_SITE_OTHER): Payer: Medicare Other | Admitting: Cardiology

## 2014-11-11 ENCOUNTER — Encounter: Payer: Self-pay | Admitting: *Deleted

## 2014-11-11 ENCOUNTER — Encounter: Payer: Self-pay | Admitting: Cardiology

## 2014-11-11 VITALS — BP 130/60 | HR 65 | Ht 61.0 in | Wt 162.0 lb

## 2014-11-11 DIAGNOSIS — I25700 Atherosclerosis of coronary artery bypass graft(s), unspecified, with unstable angina pectoris: Secondary | ICD-10-CM | POA: Diagnosis not present

## 2014-11-11 DIAGNOSIS — E7800 Pure hypercholesterolemia, unspecified: Secondary | ICD-10-CM

## 2014-11-11 DIAGNOSIS — R0602 Shortness of breath: Secondary | ICD-10-CM

## 2014-11-11 DIAGNOSIS — R079 Chest pain, unspecified: Secondary | ICD-10-CM | POA: Diagnosis not present

## 2014-11-11 DIAGNOSIS — I5032 Chronic diastolic (congestive) heart failure: Secondary | ICD-10-CM | POA: Diagnosis not present

## 2014-11-11 DIAGNOSIS — I2510 Atherosclerotic heart disease of native coronary artery without angina pectoris: Secondary | ICD-10-CM

## 2014-11-11 MED ORDER — FUROSEMIDE 40 MG PO TABS: 40.0000 mg | ORAL_TABLET | Freq: Two times a day (BID) | ORAL | Status: DC

## 2014-11-11 NOTE — Patient Instructions (Signed)
Increase lasix (furosemide) to 40mg  two times a day.   Your physician has requested that you have an echocardiogram. Echocardiography is a painless test that uses sound waves to create images of your heart. It provides your doctor with information about the size and shape of your heart and how well your heart's chambers and valves are working. This procedure takes approximately one hour. There are no restrictions for this procedure.  Your physician has requested that you have a lexiscan myoview. For further information please visit [email or web address]. Please follow instruction sheet, as given.  Your physician recommends that you return for lab work in about 7-10 days. You can have this when you have other testing.   Your physician recommends that you schedule a follow-up appointment in: 1 month with Dr Aundra Dubin.

## 2014-11-11 NOTE — Progress Notes (Signed)
Patient ID: Christine Gay, female   DOB: [birth year], 74 y.o.   MRN: [id number] PCP: Dr. Delfina Redwood  74 yo with history of CAD s/p CABG, DM, and HTN presents for cardiology followup.  She had a Lexiscan Cardiolite in 2014 that was an intermediate risk study and echo showed basal to mid inferior hypokinesis with EF 55-60%.  I did a cardaic catheterization in 8/14 that showed patent LIMA-LAD, occluded SVG-OM1, and 95% ostial LCx stenosis.  She had PCI to ostial LCx with Promus DES.  Cardiolite in 5/15 showed no ischemia or infarction.  She had an upper GI bleed in 9/15, she is now off Plavix.   No melena or BRBPR.  She continues to have frequent lightheaded episodes.  Some seem orthostatic, others are more with head movement, suggestive of positional vertigo.  She also has problems with balance and has to walk with a cane.  She is currently taking Lasix 20 mg bid.  Weight is up 5 lbs.  She has been more short of breath x 2 months, this is corroborated by her daughter who accompanies her.  She is short of breath walking around her house and walking from her house to her car.  No orthopnea/PND.  She continues to have periodic pain in her central chest radiating to the sides. The pain comes and goes, usually at rest but sometimes with activity. She is most worried about the dyspnea.   Labs (3/13): K 4.2, creatinine 0.77 Labs (6/14): LDL 80, HDL 69 Labs (8/14): BNP 971 => 420 => 138, creatinine 0.8, K 4.3 Labs (11/14): K 4.1, creatinine 0.8, BNP 88 Labs (3/15): K 3.4, creatinine 0.86, LDL 85 Labs (9/15): K 3.2, creatinine 0.63, hgb 8 Labs (1/16): LDL 96 Labs (2/16): K 4.2, creatinine 0.89  PMH: 1. GERD 2. Seizure disorder 3. Type II diabetes 4. Chronic LBBB 5. CAD: s/p CABG in 1992, had PCI several years ago.  Echo (2/11) with EF 55%, moderate LVH, aortic sclerosis.  LHC (8/14) with patent LIMA-LAD, totally occluded SVG-OM1, 95% ostial LCx, totally occluded small nondominant RCA with EF 55%.  Patient had  Promus DES to pLCx. Lexiscan Cardiolite (5/15) with EF 57%, mild breast attenuation, no ischemia or infarction.  6. Glaucoma 7. Hypothyroidism 8. HTN 9. Cholecystectomy 10. Diastolic CHF: Echo (0/17) with EF 55-60%, basal to mid inferior hypokinesis, mild MR and mild AI. 11. Carotid stenosis: Carotid dopplers (6/15) with 40-59% bilateral ICA stenosis.  12. GI bleed 9/15: EGD showed a polypoid gastric lesion that was friable, benign pathology.   SH: Widow, lives in [place], does not smoke.   FH: No premature CAD.  Diabetes.   ROS: All systems reviewed and negative except as per HPI.   Current Outpatient Prescriptions  Medication Sig Dispense Refill  . aspirin 81 MG tablet Take 81 mg by mouth daily.    . brimonidine-timolol (COMBIGAN) 0.2-0.5 % ophthalmic solution Place 1 drop into both eyes daily.     . carvedilol (COREG) 6.25 MG tablet Take 1 tablet (6.25 mg total) by mouth 2 (two) times daily.    . ferrous sulfate 325 (65 FE) MG tablet Take 1 tablet (325 mg total) by mouth 2 (two) times daily with a meal. 90 tablet 0  . fluorometholone (FML) 0.1 % ophthalmic suspension   6  . isosorbide mononitrate (IMDUR) 60 MG 24 hr tablet TAKE 2 TABLETS BY MOUTH 2 TIMES DAILY. 360 tablet 0  . levothyroxine (SYNTHROID, LEVOTHROID) 100 MCG tablet Take 100 mcg by mouth daily  before breakfast.    . loteprednol (LOTEMAX) 0.5 % ophthalmic suspension Place 1 drop into both eyes daily.     . metFORMIN (GLUCOPHAGE-XR) 500 MG 24 hr tablet Take 500 mg by mouth 2 (two) times daily. 500 mg by mouth twice daily    . nitroGLYCERIN (NITROSTAT) 0.4 MG SL tablet Place 1 tablet (0.4 mg total) under the tongue every 5 (five) minutes as needed for chest pain. 25 tablet 11  . pantoprazole (PROTONIX) 40 MG tablet TAKE 1 TABLET BY MOUTH DAILY. 90 tablet 2  . phenytoin (DILANTIN) 100 MG ER capsule Take 100 mg by mouth 2 (two) times daily. Two tablets by mouth twice daily    . potassium chloride SA (K-DUR,KLOR-CON) 20 MEQ  tablet TAKE 1 TABLET BY MOUTH EVERY MORNING AND 1/2 TABLET EVERY EVENING. 135 tablet 0  . prednisoLONE sodium phosphate (INFLAMASE FORTE) 1 % ophthalmic solution Place 1 drop into both eyes every other day. Every other day    . rosuvastatin (CRESTOR) 40 MG tablet Take 1 tablet (40 mg total) by mouth daily. 30 tablet 3  . furosemide (LASIX) 40 MG tablet Take 1 tablet (40 mg total) by mouth 2 (two) times daily. 60 tablet 3   No current facility-administered medications for this visit.    BP 130/60 mmHg  Pulse 65  Ht 5\' 1"  (1.549 m)  Wt 162 lb (73.483 kg)  BMI 30.63 kg/m2  SpO2 98% General: NAD Neck: JVP 8 cm with HJR, no thyromegaly or thyroid nodule.  Lungs: Clear to auscultation bilaterally with normal respiratory effort. CV: Nondisplaced PMI.  Heart regular S1/S2, no S3/S4, 2/6 early SEM.  1+ ankle edema bilaterally.  Right carotid bruit.   Abdomen: Soft, nontender, no hepatosplenomegaly, no distention.  Skin: Intact without lesions or rashes.  Neurologic: Alert and oriented x 3.  Psych: Normal affect. Extremities: No clubbing or cyanosis.   Assessment/Plan: 1. CAD: Status post Promus DES to ostial LCx in 8/14.  Lexiscan Cardiolite in 5/15 showed no ischemia or infarction. She has fairly frequent episodes of probably atypical chest pain along with significant exertional dyspnea.  - She is on maximal Imdur (120 mg) already.  I will continue Coreg 6.25 mg bid.  - She is not a ranolazine candidate given use of Dilantin.  - I am not sure that her chest pain is anginal.  However, given the exertional dyspnea and her history, I will arrange for a Lexiscan Cardiolite.  2. Diastolic CHF: EF 97-98% on last echo in 2014.  NYHA class III symptoms with volume overload on exam and weight gain.  - Increase Lasix to 40 mg bid with BMET in 10 days.  - I will arrange for repeat echocardiogram.  3. Hyperlipidemia: Recently changed to Crestor 40 mg daily.  Check lipids/LFTs in 2 months.   4. Carotid  bruit: Due for repeat carotid dopplers in 6/16.  5. "Dizziness": Probably multifactorial.   Suspect imbalance perhaps from diabetic neuropathy likely plays a roll.  Orthostatics were normal when checked.  I asked her to wear graded compression stockings.   Followup 1 month.   [name] 11/11/2014

## 2014-11-13 DIAGNOSIS — D649 Anemia, unspecified: Secondary | ICD-10-CM | POA: Diagnosis not present

## 2014-11-19 ENCOUNTER — Other Ambulatory Visit: Payer: Self-pay | Admitting: Cardiology

## 2014-11-20 ENCOUNTER — Ambulatory Visit (HOSPITAL_COMMUNITY): Payer: Medicare Other | Attending: Cardiovascular Disease

## 2014-11-20 ENCOUNTER — Ambulatory Visit (HOSPITAL_BASED_OUTPATIENT_CLINIC_OR_DEPARTMENT_OTHER): Payer: Medicare Other | Admitting: Radiology

## 2014-11-20 ENCOUNTER — Other Ambulatory Visit (INDEPENDENT_AMBULATORY_CARE_PROVIDER_SITE_OTHER): Payer: Medicare Other | Admitting: *Deleted

## 2014-11-20 DIAGNOSIS — R079 Chest pain, unspecified: Secondary | ICD-10-CM | POA: Diagnosis present

## 2014-11-20 DIAGNOSIS — I447 Left bundle-branch block, unspecified: Secondary | ICD-10-CM | POA: Diagnosis not present

## 2014-11-20 DIAGNOSIS — E119 Type 2 diabetes mellitus without complications: Secondary | ICD-10-CM | POA: Diagnosis not present

## 2014-11-20 DIAGNOSIS — I10 Essential (primary) hypertension: Secondary | ICD-10-CM | POA: Insufficient documentation

## 2014-11-20 DIAGNOSIS — R0602 Shortness of breath: Secondary | ICD-10-CM | POA: Diagnosis not present

## 2014-11-20 LAB — BASIC METABOLIC PANEL
BUN: 22 mg/dL (ref 6–23)
CALCIUM: 8.6 mg/dL (ref 8.4–10.5)
CO2: 31 mEq/L (ref 19–32)
CREATININE: 0.89 mg/dL (ref 0.40–1.20)
Chloride: 102 mEq/L (ref 96–112)
GFR: 79.76 mL/min (ref >60.00)
Glucose, Bld: 120 mg/dL — ABNORMAL HIGH (ref 70–99)
Potassium: 4.5 mEq/L (ref 3.5–5.1)
Sodium: 138 mEq/L (ref 135–145)

## 2014-11-20 MED ORDER — TECHNETIUM TC 99M SESTAMIBI GENERIC - CARDIOLITE
33.0000 | Freq: Once | INTRAVENOUS | Status: AC | PRN
  Administered 2014-11-20: 33 via INTRAVENOUS

## 2014-11-20 MED ORDER — TECHNETIUM TC 99M SESTAMIBI GENERIC - CARDIOLITE
11.0000 | Freq: Once | INTRAVENOUS | Status: AC | PRN
  Administered 2014-11-20: 11 via INTRAVENOUS

## 2014-11-20 MED ORDER — REGADENOSON 0.4 MG/5ML IV SOLN
0.4000 mg | Freq: Once | INTRAVENOUS | Status: AC
  Administered 2014-11-20: 0.4 mg via INTRAVENOUS

## 2014-11-20 NOTE — Progress Notes (Addendum)
[facility] [address] [place], [place] [address] [phone]    Cardiology Nuclear Med Study  Christine Gay is a 74 y.o. female     MRN : [id number]     DOB: April 28, 1941  Procedure Date: 11/20/2014  Nuclear Med Background Indication for Stress Test:  Evaluation for Ischemia and Graft/Stents Patency History:  CAD, MPI 2015 (low risk) EF 54%, Hx seizures on Dilantin Cardiac Risk Factors: Carotid Disease, Hypertension, LBBB and NIDDM  Symptoms:  Chest Pain and SOB   Nuclear Pre-Procedure Caffeine/Decaff Intake:  None> 12 hrs NPO After: 7:00pm   Lungs:  clear O2 Sat: 96% on room air. IV 0.9% NS with Angio Cath:  22g  IV Site: R Antecubital x 1, tolerated well IV Started by:  Irven Baltimore, RN  Chest Size (in):  42 Cup Size: B  Height: 5\' 1"  (1.549 m)  Weight:  157 lb (71.215 kg)  BMI:  Body mass index is 29.68 kg/(m^2). Tech Comments:  Patient took Coreg and Dilantin, but held Metformin and Lasix this am. Allen Derry.    Nuclear Med Study 1 or 2 day study: 1 day  Stress Test Type:  Carlton Adam  Reading MD: N/A  Order Authorizing Provider:  [name], MD  Resting Radionuclide: Technetium 31m Sestamibi  Resting Radionuclide Dose: 11.0 mCi   Stress Radionuclide:  Technetium 52m Sestamibi  Stress Radionuclide Dose: 33.0 mCi           Stress Protocol Rest HR: 46 Stress HR: 77  Rest BP: 120/60 Stress BP: 109/70  Exercise Time (min): n/a METS: n/a   Predicted Max HR: 147 bpm % Max HR: 52.38 bpm Rate Pressure Product: 8855   Dose of Adenosine (mg):  n/a Dose of Lexiscan: 0.4 mg  Dose of Atropine (mg): n/a Dose of Dobutamine: n/a mcg/kg/min (at max HR)  Stress Test Technologist: Glade Lloyd, BS-ES  Nuclear Technologist:  Earl Many, CNMT     Rest Procedure:  Myocardial perfusion imaging was performed at rest 45 minutes following the intravenous administration of Technetium 62m Sestamibi. Rest ECG: NSR-LBBB  Stress Procedure:   The patient received IV Lexiscan 0.4 mg over 15-seconds.  Technetium 67m Sestamibi injected at 30-seconds.  Quantitative spect images were obtained after a 45 minute delay.  During the infusion of lexiscan the patient complained of cough, lightheadedness and nausea.  These symptoms began to resolve in recovery.  Stress ECG: No significant change from baseline ECG  QPS Raw Data Images:  There is a breast shadow that accounts for the anterior attenuation. Stress Images:  There is a small area of moderate attenuation of the mid anterior wall.  The uptake in the other regions is normal.   Rest Images:  There is a small area of mild attenuation of the mid anterior wal.   Subtraction (SDS):  There is a small area of attenuation of the mid anterior wall that appears to be partially reversible.  This likely represents shifting breast artifact.   Transient Ischemic Dilatation (Normal <1.22):  1.01 Lung/Heart Ratio (Normal <0.45):  0.30  Quantitative Gated Spect Images QGS EDV:  111 ml QGS ESV:  62 ml  Impression Exercise Capacity:  Lexiscan with no exercise. BP Response:  Normal blood pressure response. Clinical Symptoms:  No significant symptoms noted. ECG Impression:  No significant ST segment change suggestive of ischemia. Comparison with Prior Nuclear Study: Since the previous study on 02/20/14, the EF has decreased slightly.  The mid anterior defect  is basically unchanged.   Overall Impression:  Normal stress nuclear study. and Low risk stress nuclear study .   There is a mid anterior wall defect that is partially reversible.  I suspect this is due to shifting  breast artifact.  I cannot exclude a small area of ischemia.  Her EF has fallen from 54% to 44% .     Marland Kitchen  LV Ejection Fraction: 44%.  LV Wall Motion:  global hypokinesis.        Thayer Headings, Brooke Bonito., MD, [facility] 11/20/2014, 4:52 PM [address],  [address] Office - [phone] Pager [phone]  EF lower at 44% but  suspect no significant ischemia and low risk.   [name] 11/21/2014 2:04 PM

## 2014-11-20 NOTE — Progress Notes (Signed)
2D Echo completed. 11/20/2014 

## 2014-11-21 NOTE — Progress Notes (Signed)
Pt.notified

## 2014-12-06 DIAGNOSIS — H16223 Keratoconjunctivitis sicca, not specified as Sjogren's, bilateral: Secondary | ICD-10-CM | POA: Diagnosis not present

## 2014-12-06 DIAGNOSIS — H4011X2 Primary open-angle glaucoma, moderate stage: Secondary | ICD-10-CM | POA: Diagnosis not present

## 2014-12-16 ENCOUNTER — Other Ambulatory Visit (INDEPENDENT_AMBULATORY_CARE_PROVIDER_SITE_OTHER): Payer: Medicare Other | Admitting: *Deleted

## 2014-12-16 ENCOUNTER — Encounter: Payer: Self-pay | Admitting: Cardiology

## 2014-12-16 ENCOUNTER — Ambulatory Visit (INDEPENDENT_AMBULATORY_CARE_PROVIDER_SITE_OTHER): Payer: Medicare Other | Admitting: Cardiology

## 2014-12-16 ENCOUNTER — Telehealth: Payer: Self-pay | Admitting: Cardiology

## 2014-12-16 VITALS — BP 122/56 | HR 53 | Ht 61.0 in | Wt 162.0 lb

## 2014-12-16 DIAGNOSIS — E7800 Pure hypercholesterolemia, unspecified: Secondary | ICD-10-CM

## 2014-12-16 DIAGNOSIS — I25700 Atherosclerosis of coronary artery bypass graft(s), unspecified, with unstable angina pectoris: Secondary | ICD-10-CM

## 2014-12-16 DIAGNOSIS — I5032 Chronic diastolic (congestive) heart failure: Secondary | ICD-10-CM | POA: Diagnosis not present

## 2014-12-16 DIAGNOSIS — I2510 Atherosclerotic heart disease of native coronary artery without angina pectoris: Secondary | ICD-10-CM | POA: Diagnosis not present

## 2014-12-16 LAB — LIPID PANEL
CHOL/HDL RATIO: 3
Cholesterol: 158 mg/dL (ref 0–200)
HDL: 55.7 mg/dL (ref >39.00)
LDL Cholesterol: 91 mg/dL (ref 0–99)
NONHDL: 102.3
Triglycerides: 59 mg/dL (ref 0.0–149.0)
VLDL: 11.8 mg/dL (ref 0.0–40.0)

## 2014-12-16 LAB — HEPATIC FUNCTION PANEL
ALBUMIN: 3.3 g/dL — AB (ref 3.5–5.2)
ALT: 8 U/L (ref 0–35)
AST: 13 U/L (ref 0–37)
Alkaline Phosphatase: 81 U/L (ref 39–117)
Bilirubin, Direct: 0 mg/dL (ref 0.0–0.3)
Total Bilirubin: 0.3 mg/dL (ref 0.2–1.2)
Total Protein: 6.2 g/dL (ref 6.0–8.3)

## 2014-12-16 NOTE — Telephone Encounter (Signed)
New Message    Patient needs the appt time changed per patients daughter b/c she is the only transportation  That the patient has and the appt needs to be in the am around 9-930am.   Please give patients daughter a call back.

## 2014-12-16 NOTE — Telephone Encounter (Signed)
What is her phone number?  Thanks.

## 2014-12-16 NOTE — Patient Instructions (Signed)
Your physician recommends that you schedule a follow-up appointment in: June 2016

## 2014-12-16 NOTE — Telephone Encounter (Signed)
Pt's daughter, Gaspar Skeeters [phone] requesting morning appt around 9-9:30AM with Dr Aundra Dubin when she is scheduled to see Dr Aundra Dubin in June. She states she works 3rd shift, is her mother's only transportation, and is unable to bring her mother at any other time.  I advised Gaspar Skeeters that Dr Aundra Dubin does not have a morning schedule in the [facility] the month of June. Gaspar Skeeters asking if pt could be scheduled in [facility] in June so that her mother could be given a morning appt.   Pt advised I will forward to Dr Aundra Dubin for review.

## 2014-12-16 NOTE — Telephone Encounter (Signed)
ERROR

## 2014-12-16 NOTE — Addendum Note (Signed)
Addended by: Eulis Foster on: 12/16/2014 09:22 AM   Modules accepted: Orders

## 2014-12-16 NOTE — Progress Notes (Signed)
Patient ID: Christine Gay, female   DOB: 10-03-1940, 74 y.o.   MRN: [id number] PCP: Dr. Delfina Redwood  74 yo with history of CAD s/p CABG, DM, and HTN presents for cardiology followup.  She had a Lexiscan Cardiolite in 2014 that was an intermediate risk study and echo showed basal to mid inferior hypokinesis with EF 55-60%.  I did a cardaic catheterization in 8/14 that showed patent LIMA-LAD, occluded SVG-OM1, and 95% ostial LCx stenosis.  She had PCI to ostial LCx with Promus DES.  Cardiolite in 5/15 showed no ischemia or infarction.  She had an upper GI bleed in 9/15, she is now off Plavix.   No melena or BRBPR.  She continues to have frequent lightheaded episodes.  Some seem orthostatic, others are more with head movement, suggestive of positional vertigo.  She also has problems with balance and has to walk with a cane. At last appointment, I increased her Lasix ot 40 mg bid.  She feels like her breathing is better now.  She is short of breath with moderate activity but is ok with short distances like walking around her house or walking to the mailbox. No orthopnea/PND.  Chest pain is now rare and seems atypical.  Lexiscan Cardiolite in 2/16 showed a small, mid anterior partially reversible defect that may have been due to soft tissue attenuation (low risk) and echo showed EF 55-60%.   Labs (3/13): K 4.2, creatinine 0.77 Labs (6/14): LDL 80, HDL 69 Labs (8/14): BNP 971 => 420 => 138, creatinine 0.8, K 4.3 Labs (11/14): K 4.1, creatinine 0.8, BNP 88 Labs (3/15): K 3.4, creatinine 0.86, LDL 85 Labs (9/15): K 3.2, creatinine 0.63, hgb 8 Labs (1/16): LDL 96 Labs (2/16): K 4.2, creatinine 0.89  PMH: 1. GERD 2. Seizure disorder 3. Type II diabetes 4. Chronic LBBB 5. CAD: s/p CABG in 1992, had PCI several years ago.  Echo (2/11) with EF 55%, moderate LVH, aortic sclerosis.  LHC (8/14) with patent LIMA-LAD, totally occluded SVG-OM1, 95% ostial LCx, totally occluded small nondominant RCA with EF 55%.   Patient had Promus DES to pLCx. Lexiscan Cardiolite (5/15) with EF 57%, mild breast attenuation, no ischemia or infarction.  Lexiscan Cardiolite (2/16) with EF 44%, small partially reversible mid anterior perfusion defect may be due to soft tissue attenuation.   6. Glaucoma 7. Hypothyroidism 8. HTN 9. Cholecystectomy 10. Diastolic CHF: Echo (8/09) with EF 55-60%, basal to mid inferior hypokinesis, mild MR and mild AI. Echo (2/16) with EF 55-60%, mild LAE.  11. Carotid stenosis: Carotid dopplers (6/15) with 40-59% bilateral ICA stenosis.  12. GI bleed 9/15: EGD showed a polypoid gastric lesion that was friable, benign pathology.   SH: Widow, lives in [place], does not smoke.   FH: No premature CAD.  Diabetes.   ROS: All systems reviewed and negative except as per HPI.   Current Outpatient Prescriptions  Medication Sig Dispense Refill  . aspirin 81 MG tablet Take 81 mg by mouth daily.    . brimonidine-timolol (COMBIGAN) 0.2-0.5 % ophthalmic solution Place 1 drop into both eyes daily.     . carvedilol (COREG) 6.25 MG tablet Take 1 tablet (6.25 mg total) by mouth 2 (two) times daily.    . ferrous sulfate 325 (65 FE) MG tablet Take 1 tablet (325 mg total) by mouth 2 (two) times daily with a meal. 90 tablet 0  . fluorometholone (FML) 0.1 % ophthalmic suspension   6  . furosemide (LASIX) 40 MG tablet Take 1 tablet (  40 mg total) by mouth 2 (two) times daily. 60 tablet 3  . isosorbide mononitrate (IMDUR) 60 MG 24 hr tablet TAKE 2 TABLETS BY MOUTH 2 TIMES DAILY. 360 tablet 1  . levothyroxine (SYNTHROID, LEVOTHROID) 100 MCG tablet Take 100 mcg by mouth daily before breakfast.    . loteprednol (LOTEMAX) 0.5 % ophthalmic suspension Place 1 drop into both eyes daily.     . metFORMIN (GLUCOPHAGE-XR) 500 MG 24 hr tablet Take 500 mg by mouth 2 (two) times daily. 500 mg by mouth twice daily    . nitroGLYCERIN (NITROSTAT) 0.4 MG SL tablet Place 1 tablet (0.4 mg total) under the tongue every 5 (five)  minutes as needed for chest pain. 25 tablet 11  . pantoprazole (PROTONIX) 40 MG tablet TAKE 1 TABLET BY MOUTH DAILY. 90 tablet 2  . phenytoin (DILANTIN) 100 MG ER capsule Take 100 mg by mouth 2 (two) times daily. Two tablets by mouth twice daily    . potassium chloride SA (K-DUR,KLOR-CON) 20 MEQ tablet TAKE 1 TABLET BY MOUTH EVERY MORNING AND 1/2 TABLET EVERY EVENING. 135 tablet 1  . prednisoLONE sodium phosphate (INFLAMASE FORTE) 1 % ophthalmic solution Place 1 drop into both eyes every other day. Every other day    . rosuvastatin (CRESTOR) 40 MG tablet Take 1 tablet (40 mg total) by mouth daily. 30 tablet 3   No current facility-administered medications for this visit.    BP 122/56 mmHg  Pulse 53  Ht 5\' 1"  (1.549 m)  Wt 162 lb (73.483 kg)  BMI 30.63 kg/m2  SpO2 97% General: NAD Neck: No JVD, no thyromegaly or thyroid nodule.  Lungs: Clear to auscultation bilaterally with normal respiratory effort. CV: Nondisplaced PMI.  Heart regular S1/S2, no S3/S4, 2/6 early SEM.  No edema.  Right carotid bruit.   Abdomen: Soft, nontender, no hepatosplenomegaly, no distention.  Skin: Intact without lesions or rashes.  Neurologic: Alert and oriented x 3.  Psych: Normal affect. Extremities: No clubbing or cyanosis.   Assessment/Plan: 1. CAD: Status post Promus DES to ostial LCx in 8/14.  Lexiscan Cardiolite in 5/15 showed no ischemia or infarction. Lexiscan Cardiolite 2/16 showed probably mild anterior attenuation and was low risk but EF 44%.  However, echo done near this time showed normal EF 55-60%.  Atypical chest pain, now rare. - She is on maximal Imdur (120 mg) which I will continue.  I will continue Coreg 6.25 mg bid.  - She is not a ranolazine candidate given use of Dilantin.  2. Diastolic CHF: EF 62-03% on last echo in 2/16 (suspect this is more accurate than EF 44% by Cardiolite done close to the same time).  NYHA class II-III symptoms, improved.  She is not significantly volume overloaded  on exam.  - Continue Lasix 40 mg bid.   3. Hyperlipidemia: Recently changed to Crestor 40 mg daily.  Lipids/LFTs today.    4. Carotid bruit: Due for repeat carotid dopplers in 6/16.  5. "Dizziness": Probably multifactorial.   Suspect imbalance perhaps from diabetic neuropathy likely plays a roll.  Orthostatics were normal when checked.  I have asked her to wear graded compression stockings.   Followup 3 months.   [name] 12/16/2014

## 2014-12-17 NOTE — Telephone Encounter (Signed)
Pt advised Ok to schedule June apt  in [facility] with Dr Aundra Dubin so that she would be able to have a morning appt.   I was unable to reach [place], her mailbox was full. Pt states she will let Lenora know appt will be scheduled in Heart Failure Clinic at [facility]. I will forward message to [facility] to contact pt.

## 2014-12-17 NOTE — Telephone Encounter (Signed)
That would be fine, please have her scheduled in CHF clinic.

## 2014-12-18 ENCOUNTER — Other Ambulatory Visit: Payer: Medicare Other

## 2014-12-18 ENCOUNTER — Telehealth: Payer: Self-pay | Admitting: Cardiology

## 2014-12-18 NOTE — Telephone Encounter (Signed)
Called patient's daughter back and left message that Webb Silversmith is actually off the remainder of this week and to please call back if someone else can assist them, otherwise the message will be forwarded to [facility] upon her return next week.

## 2014-12-18 NOTE — Telephone Encounter (Signed)
New message      Webb Silversmith please call Ms Capelle daughter when you return regarding an appt in June.  She is aware Webb Silversmith is out today.

## 2014-12-20 ENCOUNTER — Other Ambulatory Visit: Payer: Self-pay | Admitting: *Deleted

## 2014-12-20 MED ORDER — EZETIMIBE 10 MG PO TABS: 10.0000 mg | ORAL_TABLET | Freq: Every day | ORAL | Status: DC

## 2014-12-26 NOTE — Telephone Encounter (Signed)
LMTCB 12/26/14

## 2014-12-26 NOTE — Telephone Encounter (Signed)
Christine Gay advised per Dr Aundra Dubin it is Ok to schedule June 2016 appt with Dr Aundra Dubin in [facility] so that pt can have a morning appt.   I advised Christine Gay that I had previously  sent a message to [facility] to arrange June 2016 9-9:30AM appt for pt in Heart Failure.  Christine Gay advised that I will send another message to [facility] to arrange June 2016 9-9:30AM appt with Dr Aundra Dubin in [facility]. I also gave Lenora telephone number, [phone],  for Heart Failure Clinic.

## 2015-01-09 DIAGNOSIS — Z803 Family history of malignant neoplasm of breast: Secondary | ICD-10-CM | POA: Diagnosis not present

## 2015-01-09 DIAGNOSIS — Z1231 Encounter for screening mammogram for malignant neoplasm of breast: Secondary | ICD-10-CM | POA: Diagnosis not present

## 2015-01-18 ENCOUNTER — Other Ambulatory Visit: Payer: Self-pay | Admitting: Cardiology

## 2015-01-21 DIAGNOSIS — N63 Unspecified lump in breast: Secondary | ICD-10-CM | POA: Diagnosis not present

## 2015-01-21 DIAGNOSIS — R921 Mammographic calcification found on diagnostic imaging of breast: Secondary | ICD-10-CM | POA: Diagnosis not present

## 2015-01-28 ENCOUNTER — Ambulatory Visit: Payer: Medicare Other

## 2015-01-30 ENCOUNTER — Ambulatory Visit (INDEPENDENT_AMBULATORY_CARE_PROVIDER_SITE_OTHER): Payer: Medicare Other

## 2015-01-30 DIAGNOSIS — B351 Tinea unguium: Secondary | ICD-10-CM

## 2015-01-30 DIAGNOSIS — E1140 Type 2 diabetes mellitus with diabetic neuropathy, unspecified: Secondary | ICD-10-CM | POA: Diagnosis not present

## 2015-01-30 DIAGNOSIS — Q828 Other specified congenital malformations of skin: Secondary | ICD-10-CM | POA: Diagnosis not present

## 2015-01-30 DIAGNOSIS — M79673 Pain in unspecified foot: Secondary | ICD-10-CM | POA: Diagnosis not present

## 2015-01-31 NOTE — Progress Notes (Signed)
   Subjective:    Patient ID: Christine Gay, female    DOB: July 20, 1941, 74 y.o.   MRN: [id number]  HPI  Pt presents for nail debridement  Review of Systems no new findings or systemic changes noted     Objective:   Physical Exam  Lower extremity objective findings unchanged pedal pulses DP +2 PT 1 over 4 bilateral capillary refill time 3 seconds there is mild edema noted bilateral no open wounds no ulcers no secondary infections nails thick brittle crumbly friable 1 through 5 bilateral. Patient does have keratoses sub-first bilateral and fifth bilateral no open wounds no ulcers no secondary infection is noted at this time. Mild digital contractures noted. Nails brittle crumbly friable and discolored      Assessment & Plan:  Assessment diabetes with complications peripheral neuropathy and angiopathy. Multiple keratoses sub-15 bilateral are debrided also debridement at this time thick brittle dystrophic frontal mycotic nails 1 through 5 bilateral return for future diabetic foot palliative nail care every 3 months as recommended next

## 2015-02-12 ENCOUNTER — Other Ambulatory Visit: Payer: Self-pay | Admitting: Cardiology

## 2015-02-27 ENCOUNTER — Other Ambulatory Visit: Payer: Self-pay | Admitting: Cardiology

## 2015-02-28 ENCOUNTER — Other Ambulatory Visit: Payer: Self-pay | Admitting: Cardiology

## 2015-03-07 DIAGNOSIS — I10 Essential (primary) hypertension: Secondary | ICD-10-CM | POA: Diagnosis not present

## 2015-03-07 DIAGNOSIS — D649 Anemia, unspecified: Secondary | ICD-10-CM | POA: Diagnosis not present

## 2015-03-07 DIAGNOSIS — E663 Overweight: Secondary | ICD-10-CM | POA: Diagnosis not present

## 2015-03-07 DIAGNOSIS — M8588 Other specified disorders of bone density and structure, other site: Secondary | ICD-10-CM | POA: Diagnosis not present

## 2015-03-07 DIAGNOSIS — E780 Pure hypercholesterolemia: Secondary | ICD-10-CM | POA: Diagnosis not present

## 2015-03-07 DIAGNOSIS — R569 Unspecified convulsions: Secondary | ICD-10-CM | POA: Diagnosis not present

## 2015-03-07 DIAGNOSIS — E139 Other specified diabetes mellitus without complications: Secondary | ICD-10-CM | POA: Diagnosis not present

## 2015-03-07 DIAGNOSIS — I2510 Atherosclerotic heart disease of native coronary artery without angina pectoris: Secondary | ICD-10-CM | POA: Diagnosis not present

## 2015-03-07 DIAGNOSIS — E039 Hypothyroidism, unspecified: Secondary | ICD-10-CM | POA: Diagnosis not present

## 2015-03-07 DIAGNOSIS — Z6831 Body mass index (BMI) 31.0-31.9, adult: Secondary | ICD-10-CM | POA: Diagnosis not present

## 2015-03-13 ENCOUNTER — Ambulatory Visit: Payer: Medicare Other | Admitting: Cardiology

## 2015-03-14 ENCOUNTER — Ambulatory Visit (HOSPITAL_COMMUNITY)
Admission: RE | Admit: 2015-03-14 | Discharge: 2015-03-14 | Disposition: A | Discharge status: Home / Self Care | Payer: Medicare Other | Source: Ambulatory Visit | Attending: Cardiology | Admitting: Cardiology

## 2015-03-14 ENCOUNTER — Encounter (HOSPITAL_COMMUNITY): Payer: Self-pay

## 2015-03-14 VITALS — BP 98/58 | HR 58 | Wt 160.0 lb

## 2015-03-14 DIAGNOSIS — H409 Unspecified glaucoma: Secondary | ICD-10-CM | POA: Diagnosis not present

## 2015-03-14 DIAGNOSIS — Z79899 Other long term (current) drug therapy: Secondary | ICD-10-CM | POA: Insufficient documentation

## 2015-03-14 DIAGNOSIS — E039 Hypothyroidism, unspecified: Secondary | ICD-10-CM | POA: Insufficient documentation

## 2015-03-14 DIAGNOSIS — I509 Heart failure, unspecified: Secondary | ICD-10-CM | POA: Diagnosis not present

## 2015-03-14 DIAGNOSIS — Z7982 Long term (current) use of aspirin: Secondary | ICD-10-CM | POA: Diagnosis not present

## 2015-03-14 DIAGNOSIS — E119 Type 2 diabetes mellitus without complications: Secondary | ICD-10-CM | POA: Insufficient documentation

## 2015-03-14 DIAGNOSIS — J209 Acute bronchitis, unspecified: Secondary | ICD-10-CM | POA: Diagnosis not present

## 2015-03-14 DIAGNOSIS — I5032 Chronic diastolic (congestive) heart failure: Secondary | ICD-10-CM | POA: Insufficient documentation

## 2015-03-14 DIAGNOSIS — K219 Gastro-esophageal reflux disease without esophagitis: Secondary | ICD-10-CM | POA: Insufficient documentation

## 2015-03-14 DIAGNOSIS — R42 Dizziness and giddiness: Secondary | ICD-10-CM | POA: Diagnosis not present

## 2015-03-14 DIAGNOSIS — I10 Essential (primary) hypertension: Secondary | ICD-10-CM | POA: Diagnosis not present

## 2015-03-14 DIAGNOSIS — Z951 Presence of aortocoronary bypass graft: Secondary | ICD-10-CM | POA: Insufficient documentation

## 2015-03-14 DIAGNOSIS — I447 Left bundle-branch block, unspecified: Secondary | ICD-10-CM | POA: Insufficient documentation

## 2015-03-14 DIAGNOSIS — R059 Cough, unspecified: Secondary | ICD-10-CM

## 2015-03-14 DIAGNOSIS — R05 Cough: Secondary | ICD-10-CM

## 2015-03-14 DIAGNOSIS — R0989 Other specified symptoms and signs involving the circulatory and respiratory systems: Secondary | ICD-10-CM | POA: Diagnosis not present

## 2015-03-14 DIAGNOSIS — E7800 Pure hypercholesterolemia, unspecified: Secondary | ICD-10-CM

## 2015-03-14 DIAGNOSIS — I6523 Occlusion and stenosis of bilateral carotid arteries: Secondary | ICD-10-CM | POA: Insufficient documentation

## 2015-03-14 DIAGNOSIS — E785 Hyperlipidemia, unspecified: Secondary | ICD-10-CM | POA: Diagnosis not present

## 2015-03-14 DIAGNOSIS — I2510 Atherosclerotic heart disease of native coronary artery without angina pectoris: Secondary | ICD-10-CM | POA: Insufficient documentation

## 2015-03-14 DIAGNOSIS — R0602 Shortness of breath: Secondary | ICD-10-CM | POA: Diagnosis not present

## 2015-03-14 DIAGNOSIS — G40909 Epilepsy, unspecified, not intractable, without status epilepticus: Secondary | ICD-10-CM | POA: Insufficient documentation

## 2015-03-14 DIAGNOSIS — I25118 Atherosclerotic heart disease of native coronary artery with other forms of angina pectoris: Secondary | ICD-10-CM

## 2015-03-14 LAB — CBC
HCT: 34.6 % — ABNORMAL LOW (ref 36.0–46.0)
HEMOGLOBIN: 11.6 g/dL — AB (ref 12.0–15.0)
MCH: 32.1 pg (ref 26.0–34.0)
MCHC: 33.5 g/dL (ref 30.0–36.0)
MCV: 95.8 fL (ref 78.0–100.0)
Platelets: 155 10*3/uL (ref 150–400)
RBC: 3.61 MIL/uL — ABNORMAL LOW (ref 3.87–5.11)
RDW: 12 % (ref 11.5–15.5)
WBC: 8.9 10*3/uL (ref 4.0–10.5)

## 2015-03-14 LAB — LIPID PANEL
CHOLESTEROL: 176 mg/dL (ref 0–200)
HDL: 69 mg/dL (ref >40)
LDL Cholesterol: 93 mg/dL (ref 0–99)
TRIGLYCERIDES: 71 mg/dL (ref <150)
Total CHOL/HDL Ratio: 2.6 RATIO
VLDL: 14 mg/dL (ref 0–40)

## 2015-03-14 LAB — BASIC METABOLIC PANEL
Anion gap: 10 (ref 5–15)
BUN: 16 mg/dL (ref 6–20)
CALCIUM: 8.1 mg/dL — AB (ref 8.9–10.3)
CO2: 26 mmol/L (ref 22–32)
CREATININE: 1.07 mg/dL — AB (ref 0.44–1.00)
Chloride: 102 mmol/L (ref 101–111)
GFR calc Af Amer: 58 mL/min — ABNORMAL LOW (ref >60)
GFR calc non Af Amer: 50 mL/min — ABNORMAL LOW (ref >60)
GLUCOSE: 107 mg/dL — AB (ref 65–99)
Potassium: 4.1 mmol/L (ref 3.5–5.1)
SODIUM: 138 mmol/L (ref 135–145)

## 2015-03-14 LAB — BRAIN NATRIURETIC PEPTIDE: B Natriuretic Peptide: 356.4 pg/mL — ABNORMAL HIGH (ref 0.0–100.0)

## 2015-03-14 MED ORDER — DOXYCYCLINE HYCLATE 50 MG PO CAPS: 100.0000 mg | ORAL_CAPSULE | Freq: Two times a day (BID) | ORAL | Status: DC

## 2015-03-14 NOTE — Patient Instructions (Addendum)
TAKE Robitussin DM (over the counter) for cough.  TAKE Coricidin (over the counter) for congestion.  TAKE Doxycycline 100 mg (2 tablets) twice a day for 1 week.   CHEST XRAY today.  Your Provider requests you have a CAROTID DOPPLER performed.  FOLLOW UP in 6 months.

## 2015-03-16 NOTE — Progress Notes (Signed)
Patient ID: Christine Gay, female   DOB: 12-31-1940, 74 y.o.   MRN: [id number] PCP: Dr. Delfina Gay  74 yo with history of CAD s/p CABG, DM, and HTN presents for cardiology followup.  She had a Lexiscan Cardiolite in 2014 that was an intermediate risk study and echo showed basal to mid inferior hypokinesis with EF 55-60%.  I did a cardaic catheterization in 8/14 that showed patent LIMA-LAD, occluded SVG-OM1, and 95% ostial LCx stenosis.  She had PCI to ostial LCx with Promus DES.  Cardiolite in 5/15 showed no ischemia or infarction.  She had an upper GI bleed in 9/15, she is now off Plavix. No melena or BRBPR.  She continues to have lightheaded episodes.  Some seem orthostatic, others are more with head movement, suggestive of positional vertigo.  She also has problems with balance and has to walk with a cane.   Breathing is generally doing ok.  She is short of breath with moderate activity (walking about 100 feet or going up steps) but is ok with short distances like walking around her house or walking to the mailbox. No orthopnea/PND.  She still has chest pain with heavy exertion like carrying laundry but overall chest pain has been improved over the last few months.  Lexiscan Cardiolite in 2/16 showed a small, mid anterior partially reversible defect that may have been due to soft tissue attenuation (low risk) and echo showed EF 55-60%.   She does feel bad today.  She is congested and coughing.  No fever.  Feels like she has a cold.  Wrapped up in a blanket.  I had her get a CXR today, no PNA.    Labs (3/13): K 4.2, creatinine 0.77 Labs (6/14): LDL 80, HDL 69 Labs (8/14): BNP 971 => 420 => 138, creatinine 0.8, K 4.3 Labs (11/14): K 4.1, creatinine 0.8, BNP 88 Labs (3/15): K 3.4, creatinine 0.86, LDL 85 Labs (9/15): K 3.2, creatinine 0.63, hgb 8 Labs (1/16): LDL 96 Labs (2/16): K 4.2, creatinine 0.89 Labs (3/16): LDL 91, HDL 56  PMH: 1. GERD 2. Seizure disorder 3. Type II diabetes 4. Chronic  LBBB 5. CAD: s/p CABG in 1992, had PCI several years ago.  Echo (2/11) with EF 55%, moderate LVH, aortic sclerosis.  LHC (8/14) with patent LIMA-LAD, totally occluded SVG-OM1, 95% ostial LCx, totally occluded small nondominant RCA with EF 55%.  Patient had Promus DES to pLCx. Lexiscan Cardiolite (5/15) with EF 57%, mild breast attenuation, no ischemia or infarction.  Lexiscan Cardiolite (2/16) with EF 44%, small partially reversible mid anterior perfusion defect may be due to soft tissue attenuation.   6. Glaucoma 7. Hypothyroidism 8. HTN 9. Cholecystectomy 10. Diastolic CHF: Echo (6/81) with EF 55-60%, basal to mid inferior hypokinesis, mild MR and mild AI. Echo (2/16) with EF 55-60%, mild LAE.  11. Carotid stenosis: Carotid dopplers (6/15) with 40-59% bilateral ICA stenosis.  12. GI bleed 9/15: EGD showed a polypoid gastric lesion that was friable, benign pathology.   SH: Widow, lives in [place], does not smoke.   FH: No premature CAD.  Diabetes.   ROS: All systems reviewed and negative except as per HPI.   Current Outpatient Prescriptions  Medication Sig Dispense Refill  . aspirin 81 MG tablet Take 81 mg by mouth daily.    . brimonidine-timolol (COMBIGAN) 0.2-0.5 % ophthalmic solution Place 1 drop into both eyes daily.     . carvedilol (COREG) 6.25 MG tablet Take 1 tablet (6.25 mg total) by mouth 2 (  two) times daily.    . CRESTOR 40 MG tablet TAKE 1 TABLET (40 MG TOTAL) BY MOUTH DAILY. 90 tablet 0  . ezetimibe (ZETIA) 10 MG tablet Take 1 tablet (10 mg total) by mouth daily. 90 tablet 3  . ferrous sulfate 325 (65 FE) MG tablet Take 1 tablet (325 mg total) by mouth 2 (two) times daily with a meal. 90 tablet 0  . fluorometholone (FML) 0.1 % ophthalmic suspension   6  . furosemide (LASIX) 40 MG tablet TAKE 1 TABLET BY MOUTH 2 TIMES DAILY. 60 tablet 3  . isosorbide mononitrate (IMDUR) 60 MG 24 hr tablet TAKE 2 TABLETS BY MOUTH 2 TIMES DAILY. 360 tablet 1  . levothyroxine (SYNTHROID,  LEVOTHROID) 100 MCG tablet Take 100 mcg by mouth daily before breakfast.    . loteprednol (LOTEMAX) 0.5 % ophthalmic suspension Place 1 drop into both eyes daily.     . metFORMIN (GLUCOPHAGE-XR) 500 MG 24 hr tablet Take 500 mg by mouth 2 (two) times daily. 500 mg by mouth twice daily    . nitroGLYCERIN (NITROSTAT) 0.4 MG SL tablet Place 1 tablet (0.4 mg total) under the tongue every 5 (five) minutes as needed for chest pain. 25 tablet 11  . pantoprazole (PROTONIX) 40 MG tablet TAKE 1 TABLET BY MOUTH DAILY. 90 tablet 2  . phenytoin (DILANTIN) 100 MG ER capsule Take 100 mg by mouth 2 (two) times daily. Two tablets by mouth twice daily    . potassium chloride SA (K-DUR,KLOR-CON) 20 MEQ tablet TAKE 1 TABLET BY MOUTH EVERY MORNING AND 1/2 TABLET EVERY EVENING. 135 tablet 1  . prednisoLONE sodium phosphate (INFLAMASE FORTE) 1 % ophthalmic solution Place 1 drop into both eyes every other day. Every other day    . doxycycline (VIBRAMYCIN) 50 MG capsule Take 2 capsules (100 mg total) by mouth 2 (two) times daily. For 1 week 28 capsule 0   No current facility-administered medications for this encounter.    BP 98/58 mmHg  Pulse 58  Wt 160 lb (72.576 kg)  SpO2 97% General: NAD Neck: No JVD, no thyromegaly or thyroid nodule.  Lungs: Rhonchi at right base.  CV: Nondisplaced PMI.  Heart regular S1/S2, no S3/S4, 2/6 early SEM.  Trace ankle edema.  Right carotid bruit.   Abdomen: Soft, nontender, no hepatosplenomegaly, no distention.  Skin: Intact without lesions or rashes.  Neurologic: Alert and oriented x 3.  Psych: Normal affect. Extremities: No clubbing or cyanosis.   Assessment/Plan: 1. CAD: Status post Promus DES to ostial LCx in 8/14.  Lexiscan Cardiolite in 5/15 showed no ischemia or infarction. Lexiscan Cardiolite 2/16 showed probably mild anterior attenuation and was low risk but EF 44%.  However, echo done near this time showed normal EF 55-60%.  Now only get chest pain with heavy exertion.  -  She is on maximal Imdur (120 mg) which I will continue.  I will continue Coreg 6.25 mg bid.  - She is not a ranolazine candidate given use of Dilantin.  2. Diastolic CHF: EF 91-66% on last echo in 2/16 (suspect this is more accurate than EF 44% by Cardiolite done close to the same time).  NYHA class II-III symptoms, improved.  She is not significantly volume overloaded on exam.  - Continue Lasix 40 mg bid.   - BMET/BNP today.  3. Hyperlipidemia: Lipids still too high in 3/16.  I added Zetia, will check lipids today.  4. Carotid bruit: Due for carotid dopplers, I will arrange.  5. "Dizziness":  Probably multifactorial.   Suspect imbalance perhaps from diabetic neuropathy likely plays a roll.  Orthostatics were normal when checked.  I have asked her to wear graded compression stockings.  6. H/o GI bleeding: I will check CBC today.  7. Acute bronchitis: Lung exam was concerning for PNA.  I had her get a CXR, no infiltrate.  Suspect acute bronchitis.  I told her that she could take guaifenisin DM and Coricidin over the counter.  I will give her a prescription for a 10 day course of doxycycline.    [name] 03/16/2015

## 2015-03-17 ENCOUNTER — Other Ambulatory Visit (HOSPITAL_COMMUNITY): Payer: Self-pay | Admitting: Cardiology

## 2015-03-17 DIAGNOSIS — R42 Dizziness and giddiness: Secondary | ICD-10-CM

## 2015-03-17 DIAGNOSIS — R0989 Other specified symptoms and signs involving the circulatory and respiratory systems: Secondary | ICD-10-CM

## 2015-03-24 ENCOUNTER — Ambulatory Visit (HOSPITAL_COMMUNITY): Payer: Medicare Other | Attending: Cardiovascular Disease

## 2015-03-24 DIAGNOSIS — R0989 Other specified symptoms and signs involving the circulatory and respiratory systems: Secondary | ICD-10-CM | POA: Diagnosis not present

## 2015-03-24 DIAGNOSIS — R42 Dizziness and giddiness: Secondary | ICD-10-CM

## 2015-03-24 DIAGNOSIS — I6523 Occlusion and stenosis of bilateral carotid arteries: Secondary | ICD-10-CM | POA: Insufficient documentation

## 2015-04-09 ENCOUNTER — Encounter (HOSPITAL_COMMUNITY): Payer: Self-pay | Admitting: Family Medicine

## 2015-04-09 ENCOUNTER — Emergency Department (HOSPITAL_COMMUNITY): Payer: Medicare Other

## 2015-04-09 ENCOUNTER — Emergency Department (HOSPITAL_COMMUNITY)
Admission: EM | Admit: 2015-04-09 | Discharge: 2015-04-09 | Disposition: A | Discharge status: Home / Self Care | Payer: Medicare Other | Attending: Emergency Medicine | Admitting: Emergency Medicine

## 2015-04-09 ENCOUNTER — Other Ambulatory Visit: Payer: Self-pay

## 2015-04-09 DIAGNOSIS — Z9889 Other specified postprocedural states: Secondary | ICD-10-CM | POA: Diagnosis not present

## 2015-04-09 DIAGNOSIS — R001 Bradycardia, unspecified: Secondary | ICD-10-CM | POA: Diagnosis not present

## 2015-04-09 DIAGNOSIS — E780 Pure hypercholesterolemia: Secondary | ICD-10-CM | POA: Insufficient documentation

## 2015-04-09 DIAGNOSIS — D649 Anemia, unspecified: Secondary | ICD-10-CM | POA: Diagnosis not present

## 2015-04-09 DIAGNOSIS — R011 Cardiac murmur, unspecified: Secondary | ICD-10-CM | POA: Diagnosis not present

## 2015-04-09 DIAGNOSIS — G40909 Epilepsy, unspecified, not intractable, without status epilepticus: Secondary | ICD-10-CM | POA: Insufficient documentation

## 2015-04-09 DIAGNOSIS — S299XXA Unspecified injury of thorax, initial encounter: Secondary | ICD-10-CM | POA: Insufficient documentation

## 2015-04-09 DIAGNOSIS — M1990 Unspecified osteoarthritis, unspecified site: Secondary | ICD-10-CM | POA: Insufficient documentation

## 2015-04-09 DIAGNOSIS — E669 Obesity, unspecified: Secondary | ICD-10-CM | POA: Diagnosis not present

## 2015-04-09 DIAGNOSIS — Z9861 Coronary angioplasty status: Secondary | ICD-10-CM | POA: Insufficient documentation

## 2015-04-09 DIAGNOSIS — I10 Essential (primary) hypertension: Secondary | ICD-10-CM | POA: Diagnosis not present

## 2015-04-09 DIAGNOSIS — Y9289 Other specified places as the place of occurrence of the external cause: Secondary | ICD-10-CM | POA: Insufficient documentation

## 2015-04-09 DIAGNOSIS — E119 Type 2 diabetes mellitus without complications: Secondary | ICD-10-CM | POA: Diagnosis not present

## 2015-04-09 DIAGNOSIS — Y998 Other external cause status: Secondary | ICD-10-CM | POA: Diagnosis not present

## 2015-04-09 DIAGNOSIS — I252 Old myocardial infarction: Secondary | ICD-10-CM | POA: Diagnosis not present

## 2015-04-09 DIAGNOSIS — Y9389 Activity, other specified: Secondary | ICD-10-CM | POA: Insufficient documentation

## 2015-04-09 DIAGNOSIS — R569 Unspecified convulsions: Secondary | ICD-10-CM | POA: Diagnosis not present

## 2015-04-09 DIAGNOSIS — I5032 Chronic diastolic (congestive) heart failure: Secondary | ICD-10-CM | POA: Insufficient documentation

## 2015-04-09 DIAGNOSIS — E039 Hypothyroidism, unspecified: Secondary | ICD-10-CM | POA: Diagnosis not present

## 2015-04-09 DIAGNOSIS — I25119 Atherosclerotic heart disease of native coronary artery with unspecified angina pectoris: Secondary | ICD-10-CM | POA: Insufficient documentation

## 2015-04-09 DIAGNOSIS — Z951 Presence of aortocoronary bypass graft: Secondary | ICD-10-CM | POA: Diagnosis not present

## 2015-04-09 DIAGNOSIS — W1839XA Other fall on same level, initial encounter: Secondary | ICD-10-CM | POA: Diagnosis not present

## 2015-04-09 DIAGNOSIS — R002 Palpitations: Secondary | ICD-10-CM | POA: Diagnosis not present

## 2015-04-09 DIAGNOSIS — R079 Chest pain, unspecified: Secondary | ICD-10-CM | POA: Diagnosis not present

## 2015-04-09 DIAGNOSIS — K219 Gastro-esophageal reflux disease without esophagitis: Secondary | ICD-10-CM | POA: Insufficient documentation

## 2015-04-09 DIAGNOSIS — Z7982 Long term (current) use of aspirin: Secondary | ICD-10-CM | POA: Insufficient documentation

## 2015-04-09 DIAGNOSIS — Z79899 Other long term (current) drug therapy: Secondary | ICD-10-CM | POA: Diagnosis not present

## 2015-04-09 DIAGNOSIS — W19XXXA Unspecified fall, initial encounter: Secondary | ICD-10-CM

## 2015-04-09 DIAGNOSIS — R0789 Other chest pain: Secondary | ICD-10-CM | POA: Diagnosis not present

## 2015-04-09 LAB — BASIC METABOLIC PANEL
Anion gap: 6 (ref 5–15)
BUN: 8 mg/dL (ref 6–20)
CO2: 26 mmol/L (ref 22–32)
Calcium: 7.5 mg/dL — ABNORMAL LOW (ref 8.9–10.3)
Chloride: 107 mmol/L (ref 101–111)
Creatinine, Ser: 0.9 mg/dL (ref 0.44–1.00)
GFR calc Af Amer: >60 mL/min (ref >60)
GFR calc non Af Amer: >60 mL/min (ref >60)
Glucose, Bld: 84 mg/dL (ref 65–99)
Potassium: 4.7 mmol/L (ref 3.5–5.1)
Sodium: 139 mmol/L (ref 135–145)

## 2015-04-09 LAB — CBC
HCT: 34 % — ABNORMAL LOW (ref 36.0–46.0)
Hemoglobin: 11.4 g/dL — ABNORMAL LOW (ref 12.0–15.0)
MCH: 32 pg (ref 26.0–34.0)
MCHC: 33.5 g/dL (ref 30.0–36.0)
MCV: 95.5 fL (ref 78.0–100.0)
Platelets: 144 10*3/uL — ABNORMAL LOW (ref 150–400)
RBC: 3.56 MIL/uL — ABNORMAL LOW (ref 3.87–5.11)
RDW: 12 % (ref 11.5–15.5)
WBC: 5.7 10*3/uL (ref 4.0–10.5)

## 2015-04-09 LAB — I-STAT TROPONIN, ED: Troponin i, poc: 0 ng/mL (ref 0.00–0.08)

## 2015-04-09 LAB — TSH: TSH: 2.317 u[IU]/mL (ref 0.350–4.500)

## 2015-04-09 MED ORDER — ONDANSETRON HCL 4 MG/2ML IJ SOLN
4.0000 mg | Freq: Once | INTRAMUSCULAR | Status: AC
  Administered 2015-04-09: 4 mg via INTRAVENOUS
  Filled 2015-04-09: qty 2

## 2015-04-09 MED ORDER — SODIUM CHLORIDE 0.9 % IV BOLUS (SEPSIS)
1000.0000 mL | Freq: Once | INTRAVENOUS | Status: AC
  Administered 2015-04-09: 1000 mL via INTRAVENOUS

## 2015-04-09 MED ORDER — GLUCAGON HCL RDNA (DIAGNOSTIC) 1 MG IJ SOLR
2.0000 mg | Freq: Once | INTRAMUSCULAR | Status: AC
  Administered 2015-04-09: 2 mg via INTRAVENOUS
  Filled 2015-04-09: qty 2

## 2015-04-09 NOTE — ED Provider Notes (Signed)
CSN: [id number]     Arrival date & time 04/09/15  1208 History   First MD Initiated Contact with Patient 04/09/15 1348     Chief Complaint  Patient presents with  . Fall     (Consider location/radiation/quality/duration/timing/severity/associated sxs/prior Treatment) HPI  Ms. Christine Gay is a 74 year old female with past medical history CAD, left bundle branch block, hypothyroid, hypertension, diastolic heart failure, status post CABG, who was sent to the ER today by her primary care physician, Dr. Delfina Redwood, for multiple falls and bradycardia.  Her first fall was 5 days ago while taking a small bag of trash outside, no LOC, no head trauma, but difficulty getting back up.  She has another fall two days ago, and then another fall today.  She denies any mechanical issues, no stumbling or tripping, and she further denies loss of consciousness, vertigo or presyncope sx. She describes it as if she is a tree that is being cut down that just falls over.  She does not have any symptoms prior to her falls, and remembers each one in entirety.  She does have some mild dyspnea on exertion, but denies any chest pain, palpitations, lower extremity edema, diaphoresis, nausea, vomiting.  She denies any vertigo-type symptoms, does not have any weakness or numbness.    Dr. [name] is her cardiologist.  Pt had recent bilateral carotid duplex, with no significant stenosis (<59%), last echo shows LVEF 55-60%, with grade 1 diastolic dysfunction, and last visit was 03/14/2015 with documentation noting possible orthostatic presyncope episodes versus positional vertigo.  Chart review shows hx of sinus brady.  Hx of frequent falls is new  Past Medical History  Diagnosis Date  . CAD (coronary artery disease)     a. s/p CABG 1992, b. s/p PCI several years ago; c. LHC 11/08: L-LAD ok, S-OM1 ok, S-OM2 CTO, prox to mid CFX 70% (unchanged), RCA non-dominant, occluded;  d.  Echo 2/11: EF 55%, mod LVH, Ao sclerosis;  e.  Echo 6/14: mild LVH, mild focal basal sept hypertrophy, EF 55-65%, inf HK, Mild AI, mild MR, mild BAE;  f. Myoview 7/14: int risk-> cath 04/2013 s/p PTCA/DES to prox Cx 05/04/13.  . Obesity   . GERD (gastroesophageal reflux disease)   . LBBB (left bundle branch block)     chronic  . Glaucoma   . Hypothyroidism   . Hypertension   . Chronic diastolic CHF (congestive heart failure)   . High cholesterol   . Anginal pain   . Myocardial infarction 03/1991 X 2; 07/1991  . Type II diabetes mellitus   . Anemia     a. Noted on labs 04/2013.  . Sickle cell trait   . Seizures     "I'm on Dilantin" (06/10/2014)  . Arthritis     "legs" (06/10/2014)   Past Surgical History  Procedure Laterality Date  . Cataract extraction w/ intraocular lens  implant, bilateral Bilateral   . Cholecystectomy    . Tubal ligation Bilateral 1969  . Coronary angioplasty with stent placement  ? date; 05/04/2013    ? location; DES  to [place]  . Cardiac catheterization      "several;  they went up to check"  . Coronary artery bypass graft  07/1991    "CABG X 3"  . Esophagogastroduodenoscopy N/A 06/12/2014    Procedure: ESOPHAGOGASTRODUODENOSCOPY (EGD);  Surgeon: Cleotis Nipper, MD;  Location: [facility] ENDOSCOPY;  Service: Endoscopy;  Laterality: N/A;  . Colonoscopy N/A 06/12/2014    Procedure: COLONOSCOPY;  Surgeon: Cleotis Nipper, MD;  Location: [facility] ENDOSCOPY;  Service: Endoscopy;  Laterality: N/A;  . Left and right heart catheterization with coronary angiogram N/A 05/04/2013    Procedure: LEFT AND RIGHT HEART CATHETERIZATION WITH CORONARY ANGIOGRAM;  Surgeon: Larey Dresser, MD;  Location: [facility] CATH LAB;  Service: Cardiovascular;  Laterality: N/A;  . Percutaneous stent intervention  05/04/2013    Procedure: PERCUTANEOUS STENT INTERVENTION;  Surgeon: Larey Dresser, MD;  Location: [facility] CATH LAB;  Service: Cardiovascular;;   Family History  Problem Relation Age of Onset  . Diabetes Mother    History  Substance Use Topics   . Smoking status: Never Smoker   . Smokeless tobacco: Never Used  . Alcohol Use: No   OB History    No data available     Review of Systems  Constitutional: Negative for fever, diaphoresis and fatigue.  HENT: Negative.   Eyes: Negative for photophobia and visual disturbance.  Respiratory: Negative for cough, chest tightness, shortness of breath, wheezing and stridor.   Cardiovascular: Negative for chest pain, palpitations and leg swelling.  Gastrointestinal: Negative.   Genitourinary: Negative.   Musculoskeletal: Negative.   Skin: Negative.   Neurological: Negative for dizziness, syncope, weakness, light-headedness, numbness and headaches.    Allergies  Meperidine hcl; Nitroglycerin; and Morphine  Home Medications   Prior to Admission medications   Medication Sig Start Date End Date Taking? Authorizing Provider  aspirin 81 MG tablet Take 81 mg by mouth daily.   Yes Historical Provider, MD  brimonidine-timolol (COMBIGAN) 0.2-0.5 % ophthalmic solution Place 1 drop into both eyes daily.    Yes Historical Provider, MD  carvedilol (COREG) 6.25 MG tablet Take 1 tablet (6.25 mg total) by mouth 2 (two) times daily. 10/10/14  Yes Larey Dresser, MD  CRESTOR 40 MG tablet TAKE 1 TABLET (40 MG TOTAL) BY MOUTH DAILY. 01/20/15  Yes Larey Dresser, MD  ezetimibe (ZETIA) 10 MG tablet Take 1 tablet (10 mg total) by mouth daily. 12/20/14  Yes Larey Dresser, MD  ferrous sulfate 325 (65 FE) MG tablet Take 1 tablet (325 mg total) by mouth 2 (two) times daily with a meal. 06/12/14  Yes Shanker Kristeen Mans, MD  fluorometholone (FML) 0.1 % ophthalmic suspension Place 1 drop into both eyes daily.  06/21/14  Yes Historical Provider, MD  furosemide (LASIX) 40 MG tablet TAKE 1 TABLET BY MOUTH 2 TIMES DAILY. 02/12/15  Yes Larey Dresser, MD  isosorbide mononitrate (IMDUR) 60 MG 24 hr tablet TAKE 2 TABLETS BY MOUTH 2 TIMES DAILY. 11/20/14  Yes Larey Dresser, MD  levothyroxine (SYNTHROID, LEVOTHROID) 100 MCG  tablet Take 100 mcg by mouth daily before breakfast.   Yes Historical Provider, MD  metFORMIN (GLUCOPHAGE-XR) 500 MG 24 hr tablet Take 500 mg by mouth 2 (two) times daily. 500 mg by mouth twice daily 05/08/13  Yes Dayna N Dunn, PA-C  nitroGLYCERIN (NITROSTAT) 0.4 MG SL tablet Place 1 tablet (0.4 mg total) under the tongue every 5 (five) minutes as needed for chest pain. 02/15/14  Yes Larey Dresser, MD  pantoprazole (PROTONIX) 40 MG tablet TAKE 1 TABLET BY MOUTH DAILY. 08/26/14  Yes Larey Dresser, MD  phenytoin (DILANTIN) 100 MG ER capsule Take 100 mg by mouth 3 (three) times daily.    Yes Historical Provider, MD  potassium chloride SA (K-DUR,KLOR-CON) 20 MEQ tablet TAKE 1 TABLET BY MOUTH EVERY MORNING AND 1/2 TABLET EVERY EVENING. 11/20/14  Yes Larey Dresser, MD  doxycycline (  VIBRAMYCIN) 50 MG capsule Take 2 capsules (100 mg total) by mouth 2 (two) times daily. For 1 week Patient not taking: Reported on 04/09/2015 03/14/15   Larey Dresser, MD   BP 123/49 mmHg  Pulse 45  Temp(Src) 97.9 F (36.6 C) (Oral)  Resp 17  SpO2 100% Physical Exam  Constitutional: She is oriented to person, place, and time. She appears well-developed and well-nourished. She is cooperative. She does not appear ill. No distress.  HENT:  Head: Normocephalic and atraumatic.  Right Ear: External ear normal.  Left Ear: External ear normal.  Nose: Nose normal.  Mouth/Throat: Oropharynx is clear and moist. No oropharyngeal exudate.  Eyes: Conjunctivae and EOM are normal. Pupils are equal, round, and reactive to light. Right eye exhibits no discharge. Left eye exhibits no discharge. No scleral icterus.  Neck: Trachea normal, normal range of motion and phonation normal. Normal carotid pulses and no JVD present. Carotid bruit is not present. No tracheal deviation present. No thyromegaly present.  Cardiovascular: Regular rhythm and intact distal pulses.  Bradycardia present.  Exam reveals no gallop and no friction rub.    Murmur heard.  Systolic murmur is present with a grade of 3/6  Pulses:      Carotid pulses are 2+ on the right side, and 2+ on the left side.      Radial pulses are 2+ on the right side, and 2+ on the left side.  Systolic murmur heard best 2nd right intercostal space, w/o radiation  Pulmonary/Chest: Effort normal and breath sounds normal. No accessory muscle usage or stridor. No tachypnea. No respiratory distress. She has no decreased breath sounds. She has no wheezes. She has no rhonchi. She has no rales. She exhibits no tenderness.  Tender to palpation over left lower ribs  Abdominal: Soft. Bowel sounds are normal. She exhibits no distension and no mass. There is no tenderness. There is no rebound and no guarding.  Musculoskeletal: Normal range of motion. She exhibits no edema or tenderness.  Lymphadenopathy:    She has no cervical adenopathy.  Neurological: She is alert and oriented to person, place, and time. She has normal reflexes. She is not disoriented. No cranial nerve deficit or sensory deficit. She exhibits normal muscle tone. Coordination normal.  Skin: Skin is warm, dry and intact. No rash noted. She is not diaphoretic. No cyanosis or erythema. No pallor. Nails show no clubbing.  Psychiatric: She has a normal mood and affect. Her speech is normal and behavior is normal. Judgment and thought content normal. Cognition and memory are normal.  Nursing note and vitals reviewed.   ED Course  Procedures (including critical care time) Labs Review Labs Reviewed  BASIC METABOLIC PANEL - Abnormal; Notable for the following:    Calcium 7.5 (*)    All other components within normal limits  CBC - Abnormal; Notable for the following:    RBC 3.56 (*)    Hemoglobin 11.4 (*)    HCT 34.0 (*)    Platelets 144 (*)    All other components within normal limits  TSH  I-STAT TROPOININ, ED    Imaging Review Dg Chest 2 View  04/09/2015   CLINICAL DATA:  Chest pain  EXAM: CHEST  2 VIEW   COMPARISON:  03/14/2015  FINDINGS: Moderate enlargement of the cardiopericardial silhouette with tortuous thoracic aorta. Prior CABG.  No edema.  Bony demineralization.  No pleural effusion.  Coronary stent noted.  IMPRESSION: 1. Moderate enlargement of the cardiopericardial silhouette, without edema. 2.  Tortuous thoracic aorta.  Prior CABG.   Electronically Signed   By: Van Clines M.D.   On: 04/09/2015 13:30     EKG Interpretation None      MDM   Final diagnoses:  None   Multiple fall episodes without LOC, bradycardia  Pt is A&O x 3, bradycardic, originally with soft BP, responded well to IVF TSH for hx of hypothyroid 2 mg glucagon for HR, pt sinus brady with Coreg use, no suspicion of OD  Labs pertinent for stable anemia, initial troponin negative, CXR neg EKG brady in the 50's with LBBB Cardiac monitoring here in the ED has pt dropping into the 40's without symptoms, SBP ~130's  Dr. Wilson Singer has called cardiology - please see Dr. Eugenio Hoes documentation.      Delsa Grana, PA-C 04/09/15 1651  Virgel Manifold, MD 04/11/15 1440

## 2015-04-09 NOTE — ED Notes (Signed)
Pt c/o burping and mid sternal chest pain following glucagon, Dr. Wilson Singer aware

## 2015-04-09 NOTE — Consult Note (Signed)
CARDIOLOGY CONSULT NOTE   Patient ID: Christine Gay MRN: [id number], DOB/AGE: December 07, 1940   Admit date: 04/09/2015 Date of Consult: 04/09/2015   Primary Physician: Kandice Hams, MD Primary Cardiologist: Dr. [name]  Pt. Profile  74 year old woman sent to the emergency room by her PCP because of falling spells.  Problem List  Past Medical History  Diagnosis Date  . CAD (coronary artery disease)     a. s/p CABG 1992, b. s/p PCI several years ago; c. LHC 11/08: L-LAD ok, S-OM1 ok, S-OM2 CTO, prox to mid CFX 70% (unchanged), RCA non-dominant, occluded;  d.  Echo 2/11: EF 55%, mod LVH, Ao sclerosis;  e. Echo 6/14: mild LVH, mild focal basal sept hypertrophy, EF 55-65%, inf HK, Mild AI, mild MR, mild BAE;  f. Myoview 7/14: int risk-> cath 04/2013 s/p PTCA/DES to prox Cx 05/04/13.  . Obesity   . GERD (gastroesophageal reflux disease)   . LBBB (left bundle branch block)     chronic  . Glaucoma   . Hypothyroidism   . Hypertension   . Chronic diastolic CHF (congestive heart failure)   . High cholesterol   . Anginal pain   . Myocardial infarction 03/1991 X 2; 07/1991  . Type II diabetes mellitus   . Anemia     a. Noted on labs 04/2013.  . Sickle cell trait   . Seizures     "I'm on Dilantin" (06/10/2014)  . Arthritis     "legs" (06/10/2014)    Past Surgical History  Procedure Laterality Date  . Cataract extraction w/ intraocular lens  implant, bilateral Bilateral   . Cholecystectomy    . Tubal ligation Bilateral 1969  . Coronary angioplasty with stent placement  ? date; 05/04/2013    ? location; DES  to [place]  . Cardiac catheterization      "several;  they went up to check"  . Coronary artery bypass graft  07/1991    "CABG X 3"  . Esophagogastroduodenoscopy N/A 06/12/2014    Procedure: ESOPHAGOGASTRODUODENOSCOPY (EGD);  Surgeon: Cleotis Nipper, MD;  Location: [facility] ENDOSCOPY;  Service: Endoscopy;  Laterality: N/A;  . Colonoscopy N/A 06/12/2014    Procedure:  COLONOSCOPY;  Surgeon: Cleotis Nipper, MD;  Location: [facility] ENDOSCOPY;  Service: Endoscopy;  Laterality: N/A;  . Left and right heart catheterization with coronary angiogram N/A 05/04/2013    Procedure: LEFT AND RIGHT HEART CATHETERIZATION WITH CORONARY ANGIOGRAM;  Surgeon: Larey Dresser, MD;  Location: [facility] CATH LAB;  Service: Cardiovascular;  Laterality: N/A;  . Percutaneous stent intervention  05/04/2013    Procedure: PERCUTANEOUS STENT INTERVENTION;  Surgeon: Larey Dresser, MD;  Location: [facility] CATH LAB;  Service: Cardiovascular;;     Allergies  Allergies  Allergen Reactions  . Meperidine Hcl Other (See Comments)    Demerol - mental status changes    . Nitroglycerin Other (See Comments)    Patch and Cream only - blisters  . Morphine Palpitations    HPI   This 74 year old woman was sent to the emergency room by her PCP Dr. Delfina Redwood because of falling spells.  The patient has had 2 falling spells in the past week.  First episode occurred last Friday when she was walking on her sidewalk coming back from the dumpster.  All of a sudden she just fell forward" like a tree that had been cut down".  She did not lose consciousness.  She did not feel weak prior to the fall.  She did not feel dizzy.  She was fully alert after the fall.  She had a second fall 2 days ago on Monday.  She was walking into a supermarket.  A young gentleman was holding the door open for her.  As she crossed over the threshold she fell forward.  Once again she did not have any antecedent sensation of dizziness or lightheadedness or weakness.  She has not been having any chest pain.  She has a past history of chronic diastolic heart failure but has had no recent worsening of symptoms. She was last seen in the office by Dr. Algernon Huxley on 12/16/14.  He noted that she had a Lexiscan Cardiolite in 2014 that was intermediate risk and showed basal to mid inferior hypokinesis with ejection fraction of 55-60%.  Dr. Algernon Huxley did a cardiac  catheterization in August 2014 which showed patent LIMA to the LAD,occluded SVG-OM1, and 95% ostial LCx stenosis. She had PCI to ostial LCx with Promus DES. Cardiolite in 5/15 showed no ischemia or infarction.She had an upper GI bleed in September 2015 and is off Plavix.Lexiscan Cardiolite in 2/16 showed a small, mid anterior partially reversible defect that may have been due to soft tissue attenuation (low risk) and echo showed EF 55-60%.  When she was seen by Dr. Algernon Huxley in March 2016 he noted that she would have occasional lightheaded episodes.  He checked her orthostatics that day and they were negative. She has a past history of a seizure disorder and is on Dilantin.  She has not been having any seizures that she knows of. The patient has a past history of left bundle branch block and sinus bradycardia. She has a history of diabetes and is on metformin.  She has intentionally lost significant amount of weight over the past several years. Inpatient Medications    Family History Family History  Problem Relation Age of Onset  . Diabetes Mother      Social History History   Social History  . Marital Status: Married    Spouse Name: N/A  . Number of Children: N/A  . Years of Education: N/A   Occupational History  . Not on file.   Social History Main Topics  . Smoking status: Never Smoker   . Smokeless tobacco: Never Used  . Alcohol Use: No  . Drug Use: No  . Sexual Activity: No   Other Topics Concern  . Not on file   Social History Narrative     Review of Systems  General:  No chills, fever, night sweats or weight changes.  Cardiovascular:  No chest pain, dyspnea on exertion, edema, orthopnea, palpitations, paroxysmal nocturnal dyspnea. Dermatological: No rash, lesions/masses Respiratory: No cough, dyspnea Urologic: No hematuria, dysuria Abdominal:   No nausea, vomiting, diarrhea, bright red blood per rectum, melena, or hematemesis Neurologic:  No visual changes,  wkns, changes in mental status. All other systems reviewed and are otherwise negative except as noted above.  Physical Exam  Blood pressure 120/56, pulse 41, temperature 97.9 F (36.6 C), temperature source Oral, resp. rate 14, SpO2 98 %.  General: Pleasant, NAD Psych: Normal affect. Neuro: Alert and oriented X 3. Moves all extremities spontaneously. HEENT: Normal  Neck: Supple without bruits or JVD. Lungs:  Resp regular and unlabored, CTA. Heart: RRR no s3, s4, soft systolic ejection murmur at the aortic area. Abdomen: Soft, non-tender, non-distended, BS + x 4.  Extremities: No clubbing, cyanosis or edema.  Pedal pulses are weak.  Left dorsalis pedis was 2+.  Labs  No results for input(s):  CKTOTAL, CKMB, TROPONINI in the last 72 hours. Lab Results  Component Value Date   WBC 5.7 04/09/2015   HGB 11.4* 04/09/2015   HCT 34.0* 04/09/2015   MCV 95.5 04/09/2015   PLT 144* 04/09/2015     Recent Labs Lab 04/09/15 1257  NA 139  K 4.7  CL 107  CO2 26  BUN 8  CREATININE 0.90  CALCIUM 7.5*  GLUCOSE 84   Lab Results  Component Value Date   CHOL 176 03/14/2015   HDL 69 03/14/2015   LDLCALC 93 03/14/2015   TRIG 71 03/14/2015   Lab Results  Component Value Date   DDIMER  12/19/2010    0.26        AT THE INHOUSE ESTABLISHED CUTOFF VALUE OF 0.48 ug/mL FEU, THIS ASSAY HAS BEEN DOCUMENTED IN THE LITERATURE TO HAVE A SENSITIVITY AND NEGATIVE PREDICTIVE VALUE OF AT LEAST 98 TO 99%.  THE TEST RESULT SHOULD BE CORRELATED WITH AN ASSESSMENT OF THE CLINICAL PROBABILITY OF DVT / VTE.    Radiology/Studies  Dg Chest 2 View  04/09/2015   CLINICAL DATA:  Chest pain  EXAM: CHEST  2 VIEW  COMPARISON:  03/14/2015  FINDINGS: Moderate enlargement of the cardiopericardial silhouette with tortuous thoracic aorta. Prior CABG.  No edema.  Bony demineralization.  No pleural effusion.  Coronary stent noted.  IMPRESSION: 1. Moderate enlargement of the cardiopericardial silhouette, without  edema. 2. Tortuous thoracic aorta.  Prior CABG.   Electronically Signed   By: Van Clines M.D.   On: 04/09/2015 13:30   Dg Chest 2 View  03/14/2015   CLINICAL DATA:  Cough, shortness of Breath  EXAM: CHEST  2 VIEW  COMPARISON:  06/10/2014  FINDINGS: Cardiomegaly again noted. Status post median sternotomy. No acute infiltrate or pleural effusion. No pulmonary edema. Bony thorax is unremarkable.  IMPRESSION: No active cardiopulmonary disease.   Electronically Signed   By: Lahoma Crocker M.D.   On: 03/14/2015 14:01    ECG  09-Apr-2015 15:38:54 [facility]/ED ROUTINE RECORD Sinus rhythm Atrial premature complexes in couplets Left bundle branch block Personally reviewed  ASSESSMENT AND PLAN 1.  Sudden falling spells occurring without warning, uncertain etiology.  The patient denies any antecedent lightheadedness or dizziness.  She has fallen forward both times. 2.  Sinus bradycardia and left bundle branch block. 3.  Ischemic heart disease status post CABG in 1992.  Myoview in both 2016 was low risk.  Echocardiogram showed ejection fraction 55-60%.  Initial troponin is 0.00 4.  Diastolic heart failure with recent exacerbation on current therapy of Lasix 40 mg twice a day 5.  Left internal carotid artery stenosis 40-59% by carotid ultrasound 03/24/15  Recommendation: The falling spells do not appear to be on a cardiac basis.  There may reflect a basic underlying balance problem.  She does have a history of seizure disorder and it may be worthwhile to ask neurology to see her about these falling spells.  She does have moderate sinus bradycardia and I would recommend reducing her carvedilol down to 3.125 mg twice a day to allow a faster heart rate.  Continue other cardiac medications at current home dose. Will follow with you Signed, Darlin Coco MD  04/09/2015, 5:27 PM

## 2015-04-09 NOTE — ED Provider Notes (Addendum)
Medical screening examination/treatment/procedure(s) were conducted as a shared visit with non-physician practitioner(s) and myself.  I personally evaluated the patient during the encounter.   EKG Interpretation   Date/Time:  Wednesday April 09 2015 15:38:54 EDT Ventricular Rate:  51 PR Interval:  168 QRS Duration: 144 QT Interval:  487 QTC Calculation: 448 R Axis:   40 Text Interpretation:  Sinus rhythm Atrial premature complexes in couplets  Left bundle branch block Confirmed by Gabreal Worton  MD, Mashanda Ishibashi ([id number]) on  04/09/2015 5:01:52 PM     74yF with recent falls. Just fell w/o much, if any, warning. Did not lose consciousness though. Denies dizziness, lightheadedness, SOB. Saw PCP today and referred her to ED for further eval. His concerns apparently were that falls may be due to cardiogenic cause. She is pretty bradycardic. Not clearly cardiogenic in my mind, but appreciate cardiology input. LBBB noted on prior EKGs. ECHO 11/20/14 with EF of 55% to 60%. Wall motion was normal Grade 1 diastolic dysfunction.    Virgel Manifold, MD 04/09/15 1705  5:32 PM Evaluated by cardiology, Dr Mare Ferrari. Does not believe this cardiac in nature. Her neuro exam is nonfocal. No additional symptoms to suggestbrain stem/cerebellar ischemia. At this point I feel she is safe for discharge. Return precautionsdiscussed.   Virgel Manifold, MD 04/09/15 1736

## 2015-04-09 NOTE — ED Notes (Addendum)
Pt here for 2 falls of unknown cause. Denies any chest pain, SOB, dizziness. sts pain in chest from fall. Denies LOC. sts sent here by doctor to be admitted for observation for possible sick sinus syndrome.

## 2015-04-09 NOTE — ED Notes (Signed)
Pt given dc instructions, verified with Dr. Wilson Singer and Betsey Holiday that pt is stable to go home, pt sent to waiting room per charge nurse to await ride home with daughter.

## 2015-04-09 NOTE — Discharge Instructions (Signed)

## 2015-04-09 NOTE — ED Notes (Signed)
Pt stated that she was "having active chest pain" during discharge. This NT let charge nurse Janett Billow know as well as AD Melissa about pts  active chest pain and was informed that MD and cardiologists had cleared pt to be discharged and to proceed with discharge.

## 2015-04-09 NOTE — ED Notes (Signed)
MD at bedside. 

## 2015-04-09 NOTE — ED Notes (Signed)
Offered for pt to wait in hallway by nurse's station, pt states that she will wait in the waiting room since that is where she told her doctor that she was staying.

## 2015-04-10 ENCOUNTER — Telehealth: Payer: Self-pay | Admitting: Cardiology

## 2015-04-10 NOTE — Telephone Encounter (Signed)
New Message  Pt stated she has fallen twice in the past week. Pt's PCP wanted her to speak w/ RN about recent condition. Please call back and discuss.

## 2015-04-11 NOTE — Telephone Encounter (Signed)
I was not in the office on 7/14, called and spoke w/pt today, she reports she fell last Fri and again on Mon.  She states she saw pcp on Wed and he recommended she go to ER to be evaluated and admitted.  She states she went to ER 7/13 and was seen by Dr Mare Ferrari, his note states to decrease her Carvedilol to 3.125 mb Twice daily, but pt states the nurse told her to stop Carvedilol until she is seen for f/u and she has been off medication.  Pt states she has no warning when she falls, no dizziness/lightheadedness/syncope/wearkness/n/v/blurred vision, she states she is just walking and then just falls.  Will send to Dr Aundra Dubin for review

## 2015-04-11 NOTE — Telephone Encounter (Signed)
Take Coreg at 3.125 mg bid dosing.  If she is not passing out, then she needs to walk with a walker.  Recommend PT for balance training.  If there is any concern that she is passing out, will need 3 week event monitor.

## 2015-04-15 MED ORDER — CARVEDILOL 6.25 MG PO TABS: 3.1250 mg | ORAL_TABLET | Freq: Two times a day (BID) | ORAL | Status: DC

## 2015-04-15 NOTE — Telephone Encounter (Signed)
Spoke w/pt, she states she is not passing out and has no presyncope symptoms either, she stats her pcp has ordered her a walker, she has not fallen since last week and states she is feeling good.  She is agreeable to restart Carvedilol 3.125 mg  Twice daily

## 2015-04-18 ENCOUNTER — Other Ambulatory Visit: Payer: Self-pay | Admitting: Cardiology

## 2015-04-18 NOTE — Telephone Encounter (Signed)
Rx(s) sent to pharmacy electronically.  

## 2015-05-07 ENCOUNTER — Other Ambulatory Visit: Payer: Self-pay | Admitting: Cardiology

## 2015-05-08 ENCOUNTER — Ambulatory Visit (INDEPENDENT_AMBULATORY_CARE_PROVIDER_SITE_OTHER): Payer: Medicare Other | Admitting: Podiatry

## 2015-05-08 ENCOUNTER — Encounter: Payer: Self-pay | Admitting: Podiatry

## 2015-05-08 DIAGNOSIS — M79673 Pain in unspecified foot: Secondary | ICD-10-CM | POA: Diagnosis not present

## 2015-05-08 DIAGNOSIS — B351 Tinea unguium: Secondary | ICD-10-CM

## 2015-05-08 DIAGNOSIS — Q828 Other specified congenital malformations of skin: Secondary | ICD-10-CM

## 2015-05-08 DIAGNOSIS — E1140 Type 2 diabetes mellitus with diabetic neuropathy, unspecified: Secondary | ICD-10-CM

## 2015-05-08 NOTE — Progress Notes (Signed)
Patient ID: Christine Gay, female   DOB: Aug 25, 1941, 74 y.o.   MRN: [id number] Complaint:  Visit Type: Patient returns to my office for continued preventative foot care services. Complaint: Patient states" my nails have grown long and thick and become painful to walk and wear shoes" Patient has been diagnosed with DM with no foot complications. The patient presents for preventative foot care services. No changes to ROS  Podiatric Exam: Vascular: dorsalis pedis and posterior tibial pulses are palpable bilateral. Capillary return is immediate. Temperature gradient is WNL. Skin turgor WNL  Sensorium: Normal Semmes Weinstein monofilament test. Normal tactile sensation bilaterally. Nail Exam: Pt has thick disfigured discolored nails with subungual debris noted bilateral entire nail hallux through fifth toenails Ulcer Exam: There is no evidence of ulcer or pre-ulcerative changes or infection. Orthopedic Exam: Muscle tone and strength are WNL. No limitations in general ROM. No crepitus or effusions noted. Foot type and digits show no abnormalities. Bony prominences are unremarkable. HAV 1st  MPJ B/L.  Hammer toes 2 B/l Skin: No Porokeratosis. No infection or ulcers  Diagnosis:  Onychomycosis, , Pain in right toe, pain in left toes  Treatment & Plan Procedures and Treatment: Consent by patient was obtained for treatment procedures. The patient understood the discussion of treatment and procedures well. All questions were answered thoroughly reviewed. Debridement of mycotic and hypertrophic toenails, 1 through 5 bilateral and clearing of subungual debris. No ulceration, no infection noted. Initiated paperwork for diabetic shoes. Return Visit-Office Procedure: Patient instructed to return to the office for a follow up visit 3 months for continued evaluation and treatment.

## 2015-06-30 ENCOUNTER — Other Ambulatory Visit: Payer: Self-pay | Admitting: Cardiology

## 2015-07-17 DIAGNOSIS — E782 Mixed hyperlipidemia: Secondary | ICD-10-CM | POA: Diagnosis not present

## 2015-07-17 DIAGNOSIS — M8588 Other specified disorders of bone density and structure, other site: Secondary | ICD-10-CM | POA: Diagnosis not present

## 2015-07-17 DIAGNOSIS — E039 Hypothyroidism, unspecified: Secondary | ICD-10-CM | POA: Diagnosis not present

## 2015-07-17 DIAGNOSIS — R569 Unspecified convulsions: Secondary | ICD-10-CM | POA: Diagnosis not present

## 2015-07-17 DIAGNOSIS — E663 Overweight: Secondary | ICD-10-CM | POA: Diagnosis not present

## 2015-07-17 DIAGNOSIS — I10 Essential (primary) hypertension: Secondary | ICD-10-CM | POA: Diagnosis not present

## 2015-07-17 DIAGNOSIS — E139 Other specified diabetes mellitus without complications: Secondary | ICD-10-CM | POA: Diagnosis not present

## 2015-07-17 DIAGNOSIS — Z23 Encounter for immunization: Secondary | ICD-10-CM | POA: Diagnosis not present

## 2015-07-17 DIAGNOSIS — D649 Anemia, unspecified: Secondary | ICD-10-CM | POA: Diagnosis not present

## 2015-07-17 DIAGNOSIS — Z6832 Body mass index (BMI) 32.0-32.9, adult: Secondary | ICD-10-CM | POA: Diagnosis not present

## 2015-07-22 ENCOUNTER — Ambulatory Visit: Payer: Medicare Other

## 2015-07-23 ENCOUNTER — Ambulatory Visit: Payer: Medicare Other | Admitting: *Deleted

## 2015-07-23 DIAGNOSIS — E1140 Type 2 diabetes mellitus with diabetic neuropathy, unspecified: Secondary | ICD-10-CM

## 2015-07-23 NOTE — Progress Notes (Signed)
Patient ID: Christine Gay, female   DOB: 06-Feb-1941, 74 y.o.   MRN: [id number] Patient presents to be scanned and measured for diabetic shoes and inserts.

## 2015-07-24 DIAGNOSIS — E113213 Type 2 diabetes mellitus with mild nonproliferative diabetic retinopathy with macular edema, bilateral: Secondary | ICD-10-CM | POA: Diagnosis not present

## 2015-07-24 DIAGNOSIS — H353132 Nonexudative age-related macular degeneration, bilateral, intermediate dry stage: Secondary | ICD-10-CM | POA: Diagnosis not present

## 2015-07-24 DIAGNOSIS — H401132 Primary open-angle glaucoma, bilateral, moderate stage: Secondary | ICD-10-CM | POA: Diagnosis not present

## 2015-07-28 DIAGNOSIS — N6001 Solitary cyst of right breast: Secondary | ICD-10-CM | POA: Diagnosis not present

## 2015-08-01 ENCOUNTER — Other Ambulatory Visit: Payer: Self-pay | Admitting: Cardiology

## 2015-08-06 ENCOUNTER — Ambulatory Visit: Payer: Medicare Other | Admitting: Podiatry

## 2015-08-08 ENCOUNTER — Encounter: Payer: Self-pay | Admitting: Podiatry

## 2015-08-08 ENCOUNTER — Ambulatory Visit (INDEPENDENT_AMBULATORY_CARE_PROVIDER_SITE_OTHER): Payer: Medicare Other | Admitting: Podiatry

## 2015-08-08 DIAGNOSIS — M79676 Pain in unspecified toe(s): Secondary | ICD-10-CM

## 2015-08-08 DIAGNOSIS — E1140 Type 2 diabetes mellitus with diabetic neuropathy, unspecified: Secondary | ICD-10-CM

## 2015-08-08 DIAGNOSIS — Q828 Other specified congenital malformations of skin: Secondary | ICD-10-CM

## 2015-08-08 DIAGNOSIS — B351 Tinea unguium: Secondary | ICD-10-CM | POA: Diagnosis not present

## 2015-08-08 NOTE — Progress Notes (Signed)
Patient ID: LIADAN PICHE, female   DOB: [birth year], 74 y.o.   MRN: [id number] Complaint:  Visit Type: Patient returns to my office for continued preventative foot care services. Complaint: Patient states" my nails have grown long and thick and become painful to walk and wear shoes" Patient has been diagnosed with DM with no foot complications. The patient presents for preventative foot care services. No changes to ROS  Podiatric Exam: Vascular: dorsalis pedis and posterior tibial pulses are palpable bilateral. Capillary return is immediate. Temperature gradient is WNL. Skin turgor WNL  Sensorium: Normal Semmes Weinstein monofilament test. Normal tactile sensation bilaterally. Nail Exam: Pt has thick disfigured discolored nails with subungual debris noted bilateral entire nail hallux through fifth toenails Ulcer Exam: There is no evidence of ulcer or pre-ulcerative changes or infection. Orthopedic Exam: Muscle tone and strength are WNL. No limitations in general ROM. No crepitus or effusions noted. Foot type and digits show no abnormalities. Bony prominences are unremarkable. HAV 1st  MPJ B/L.  Hammer toes 2 B/l Skin: No Porokeratosis. No infection or ulcers  Diagnosis:  Onychomycosis, , Pain in right toe, pain in left toes  Treatment & Plan Procedures and Treatment: Consent by patient was obtained for treatment procedures. The patient understood the discussion of treatment and procedures well. All questions were answered thoroughly reviewed. Debridement of mycotic and hypertrophic toenails, 1 through 5 bilateral and clearing of subungual debris. No ulceration, no infection noted. Initiated paperwork for diabetic shoes. Return Visit-Office Procedure: Patient instructed to return to the office for a follow up visit 3 months for continued evaluation and treatment.  Gardiner Barefoot DPM

## 2015-08-19 ENCOUNTER — Ambulatory Visit: Payer: Self-pay

## 2015-09-02 ENCOUNTER — Ambulatory Visit (INDEPENDENT_AMBULATORY_CARE_PROVIDER_SITE_OTHER): Payer: Medicare Other | Admitting: Podiatry

## 2015-09-02 DIAGNOSIS — M2041 Other hammer toe(s) (acquired), right foot: Secondary | ICD-10-CM | POA: Diagnosis not present

## 2015-09-02 DIAGNOSIS — M2032 Hallux varus (acquired), left foot: Secondary | ICD-10-CM

## 2015-09-02 DIAGNOSIS — M2042 Other hammer toe(s) (acquired), left foot: Secondary | ICD-10-CM

## 2015-09-02 DIAGNOSIS — M2011 Hallux valgus (acquired), right foot: Secondary | ICD-10-CM

## 2015-09-02 DIAGNOSIS — L84 Corns and callosities: Secondary | ICD-10-CM

## 2015-09-02 DIAGNOSIS — M2012 Hallux valgus (acquired), left foot: Secondary | ICD-10-CM | POA: Diagnosis not present

## 2015-09-02 DIAGNOSIS — E1140 Type 2 diabetes mellitus with diabetic neuropathy, unspecified: Secondary | ICD-10-CM | POA: Diagnosis not present

## 2015-09-02 NOTE — Progress Notes (Signed)
Patient ID: Christine Gay, female   DOB: [birth year], 74 y.o.   MRN: [id number] Patient presents for diabetic shoe pick up, shoes are tried on for good fit.  Patient received 1 Pair Apex A335W Ethel Rana dark brown in women's size 6.5 wide and 3 pairs custom molded diabetic inserts with accommodative pockets for 5th met head left.  Verbal and written break in and wear instructions given.  Patient will follow up for scheduled routine care. Patient presents today and was dispensed 0ne pair ( two units) of medically necessary extra depth shoes with three pair( six units) of custom molded multiple density inserts. The shoes and the inserts are fitted to the patients ' feet and are noted to fit well and are free of defect.  Length and width of the shoes are also acceptable.  Patient was given written and verbal  instructions for wearing.  If any concerns arrive with the shoes or inserts, the patient is to call the office.Patient is to follow up with doctor in six weeks.  Diabetic neuropathy  Tx.  Dispense shoes.  Gardiner Barefoot DPM

## 2015-09-02 NOTE — Patient Instructions (Signed)

## 2015-09-16 DIAGNOSIS — K136 Irritative hyperplasia of oral mucosa: Secondary | ICD-10-CM | POA: Diagnosis not present

## 2015-09-23 DIAGNOSIS — K136 Irritative hyperplasia of oral mucosa: Secondary | ICD-10-CM | POA: Diagnosis not present

## 2015-10-15 ENCOUNTER — Other Ambulatory Visit: Payer: Self-pay | Admitting: Cardiology

## 2015-11-06 ENCOUNTER — Encounter: Payer: Self-pay | Admitting: Podiatry

## 2015-11-06 ENCOUNTER — Ambulatory Visit (INDEPENDENT_AMBULATORY_CARE_PROVIDER_SITE_OTHER): Payer: Medicare Other | Admitting: Podiatry

## 2015-11-06 DIAGNOSIS — E1140 Type 2 diabetes mellitus with diabetic neuropathy, unspecified: Secondary | ICD-10-CM | POA: Diagnosis not present

## 2015-11-06 DIAGNOSIS — M79676 Pain in unspecified toe(s): Secondary | ICD-10-CM | POA: Diagnosis not present

## 2015-11-06 DIAGNOSIS — B351 Tinea unguium: Secondary | ICD-10-CM | POA: Diagnosis not present

## 2015-11-06 DIAGNOSIS — Q828 Other specified congenital malformations of skin: Secondary | ICD-10-CM

## 2015-11-06 NOTE — Progress Notes (Signed)
Patient ID: Christine Gay, female   DOB: 07-22-1941, 75 y.o.   MRN: [id number] Complaint:  Visit Type: Patient returns to my office for continued preventative foot care services. Complaint: Patient states" my nails have grown long and thick and become painful to walk and wear shoes" Patient has been diagnosed with DM with no foot complications. The patient presents for preventative foot care services. No changes to ROS  Podiatric Exam: Vascular: dorsalis pedis and posterior tibial pulses are palpable bilateral. Capillary return is immediate. Temperature gradient is WNL. Skin turgor WNL  Sensorium: Normal Semmes Weinstein monofilament test. Normal tactile sensation bilaterally. Nail Exam: Pt has thick disfigured discolored nails with subungual debris noted bilateral entire nail hallux through fifth toenails Ulcer Exam: There is no evidence of ulcer or pre-ulcerative changes or infection. Orthopedic Exam: Muscle tone and strength are WNL. No limitations in general ROM. No crepitus or effusions noted. Foot type and digits show no abnormalities. Bony prominences are unremarkable. HAV 1st  MPJ B/L.  Hammer toes 2 B/l Skin: No Porokeratosis. No infection or ulcers.  Callus sub 5th B/L  Diagnosis:  Onychomycosis, , Pain in right toe, pain in left toes, Debride porokeratosis B/l  Treatment & Plan Procedures and Treatment: Consent by patient was obtained for treatment procedures. The patient understood the discussion of treatment and procedures well. All questions were answered thoroughly reviewed. Debridement of mycotic and hypertrophic toenails, 1 through 5 bilateral and clearing of subungual debris. No ulceration, no infection noted. . Return Visit-Office Procedure: Patient instructed to return to the office for a follow up visit 3 months for continued evaluation and treatment.  Gardiner Barefoot DPM

## 2015-11-17 DIAGNOSIS — H1010 Acute atopic conjunctivitis, unspecified eye: Secondary | ICD-10-CM | POA: Diagnosis not present

## 2015-11-17 DIAGNOSIS — H401132 Primary open-angle glaucoma, bilateral, moderate stage: Secondary | ICD-10-CM | POA: Diagnosis not present

## 2015-11-19 DIAGNOSIS — D649 Anemia, unspecified: Secondary | ICD-10-CM | POA: Diagnosis not present

## 2015-11-19 DIAGNOSIS — M810 Age-related osteoporosis without current pathological fracture: Secondary | ICD-10-CM | POA: Diagnosis not present

## 2015-11-19 DIAGNOSIS — Z6833 Body mass index (BMI) 33.0-33.9, adult: Secondary | ICD-10-CM | POA: Diagnosis not present

## 2015-11-19 DIAGNOSIS — I10 Essential (primary) hypertension: Secondary | ICD-10-CM | POA: Diagnosis not present

## 2015-11-19 DIAGNOSIS — Z1389 Encounter for screening for other disorder: Secondary | ICD-10-CM | POA: Diagnosis not present

## 2015-11-19 DIAGNOSIS — R569 Unspecified convulsions: Secondary | ICD-10-CM | POA: Diagnosis not present

## 2015-11-19 DIAGNOSIS — E139 Other specified diabetes mellitus without complications: Secondary | ICD-10-CM | POA: Diagnosis not present

## 2015-11-19 DIAGNOSIS — Z0000 Encounter for general adult medical examination without abnormal findings: Secondary | ICD-10-CM | POA: Diagnosis not present

## 2015-11-19 DIAGNOSIS — E039 Hypothyroidism, unspecified: Secondary | ICD-10-CM | POA: Diagnosis not present

## 2015-11-19 DIAGNOSIS — E663 Overweight: Secondary | ICD-10-CM | POA: Diagnosis not present

## 2015-11-19 DIAGNOSIS — I2510 Atherosclerotic heart disease of native coronary artery without angina pectoris: Secondary | ICD-10-CM | POA: Diagnosis not present

## 2015-11-28 ENCOUNTER — Other Ambulatory Visit: Payer: Self-pay | Admitting: Cardiology

## 2015-12-11 ENCOUNTER — Other Ambulatory Visit: Payer: Self-pay

## 2015-12-11 DIAGNOSIS — E0801 Diabetes mellitus due to underlying condition with hyperosmolarity with coma: Secondary | ICD-10-CM

## 2015-12-11 DIAGNOSIS — Z79899 Other long term (current) drug therapy: Secondary | ICD-10-CM

## 2015-12-11 NOTE — Patient Outreach (Signed)
[facility]) Care Management  12/11/2015  Charelle Forren [place] 30-Oct-1940 [id number]   SUBJECTIVE: Telephone call to patient regarding primary MD referral. HIPAA verified with patient. Discussed and offered [facility] care management services.  Patient verbally agreed to received [facility] care management services.  Patient states she is on oral medication for her diabetes. Patient states, " I think I'm managing it ok." Patient states she does not know what her A1C is and does not really understanding what an A1c is. Patient states her doctor checks her blood work every 3-4 months.  Patient reports her fasting blood sugar today was 113. Patient states she has gained "a little weight" and she wants to try to work on getting her weight down with her eating.  Patient verbalized agreement to received additional education concerning a diabetic diet. Patient states her current weight is 160lbs.  Patient states she had 2 falls in June 2016. States her primary doctor sent her to her cardiologist for follow up. Patient states she has not had any falls since then. Patient states she usually walks quite a bit when the weather is warmer. Patient states she ambulates with a rolling walker with seat.   RNCM discussed fall prevention with patient. RNCM discussed importance of having well lite areas in hallways and bathrooms, removal or securing  of throw rugs and removal of cords in walkways. Patient verbalized understanding.  Patient states she had open heart surgery in 1992. Patient states she sees Dr. Magdalene Molly, cardiologist. Patient states she thinks she has heart failure. States she weighs every Monday. Patient states she knows most of her medications. States she is trying to learn some of the names. Patient states she has all of her medications.  RNCM suggested [facility] pharmacy referral to patient.  Patient states she does not want to get confused talking with another pharmacist because she has her own  pharmacist.  ASSESSMENT: Referral from primary MD for assistance with disease management.  Patient will benefit from health coach follow up for diabetes education. 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  Will need health coach to assess patients knowledge of medications.  PLAN: RNCM will refer patient to health coach for diabetic education.  Quinn Plowman RN,BSN,CCM [facility] Telephonic  [phone]

## 2015-12-18 ENCOUNTER — Encounter: Payer: Self-pay | Admitting: *Deleted

## 2015-12-18 ENCOUNTER — Other Ambulatory Visit: Payer: Self-pay | Admitting: *Deleted

## 2015-12-18 ENCOUNTER — Ambulatory Visit: Payer: Self-pay | Admitting: *Deleted

## 2015-12-18 NOTE — Patient Outreach (Addendum)
[facility]) Care Management  [place]  12/18/2015   Christine Gay December 01, 1940 [id number]  [place] telephone call to patient.  Hipaa compliance verified.Per patient she checks her blood glucose once a day. Patient is on oral agents. Patient stated that her daughter fixes her food. Patient has had symptoms of hypoglycemia. She is not aware of the symptoms for hyperglycemia. Patient walks ever day for 20 minutes. She uses a walker and a quad cane. Patient does see her eye Dr regularly. Patient has glaucoma. Per patient she has been eating snacks and has gained some weight. Per patient she does check her feet daily and goes to a podiatrist. Patient is very organized with her healthcare information.   Objective:   Current Medications:  Current Outpatient Prescriptions  Medication Sig Dispense Refill  . aspirin 81 MG tablet Take 81 mg by mouth daily.    . brimonidine-timolol (COMBIGAN) 0.2-0.5 % ophthalmic solution Place 1 drop into both eyes daily.     . carvedilol (COREG) 6.25 MG tablet Take 0.5 tablets (3.125 mg total) by mouth 2 (two) times daily.    . carvedilol (COREG) 6.25 MG tablet TAKE 1/2 TABLET BY MOUTH 2 TIMES A DAY WITH A MEAL. 90 tablet 3  . CRESTOR 40 MG tablet TAKE 1 TABLET (40 MG TOTAL) BY MOUTH DAILY. 90 tablet 1  . ezetimibe (ZETIA) 10 MG tablet TAKE 1 TABLET BY MOUTH DAILY. 90 tablet 3  . ferrous sulfate 325 (65 FE) MG tablet Take 1 tablet (325 mg total) by mouth 2 (two) times daily with a meal. 90 tablet 0  . fluorometholone (FML) 0.1 % ophthalmic suspension Place 1 drop into both eyes daily.   6  . furosemide (LASIX) 40 MG tablet TAKE 1 TABLET BY MOUTH 2 TIMES DAILY. 180 tablet 3  . isosorbide mononitrate (IMDUR) 60 MG 24 hr tablet TAKE 2 TABLETS BY MOUTH 2 TIMES DAILY. 360 tablet 1  . levothyroxine (SYNTHROID, LEVOTHROID) 100 MCG tablet Take 100 mcg by mouth daily before breakfast.    . metFORMIN (GLUCOPHAGE-XR) 500 MG 24 hr  tablet Take 500 mg by mouth 2 (two) times daily. 500 mg by mouth twice daily    . NITROSTAT 0.4 MG SL tablet PLACE 1 TABLET (0.4 MG TOTAL) UNDER THE TONGUE EVERY 5 (FIVE) MINUTES AS NEEDED FOR CHEST PAIN. 25 tablet 11  . pantoprazole (PROTONIX) 40 MG tablet TAKE 1 TABLET BY MOUTH DAILY. 90 tablet 2  . phenytoin (DILANTIN) 100 MG ER capsule Take 100 mg by mouth 3 (three) times daily.     . potassium chloride SA (K-DUR,KLOR-CON) 20 MEQ tablet TAKE 1 TABLET BY MOUTH EVERY MORNING AND 1/2 TABLET EVERY EVENING. 135 tablet 1  . doxycycline (VIBRAMYCIN) 50 MG capsule Take 2 capsules (100 mg total) by mouth 2 (two) times daily. For 1 week (Patient not taking: Reported on 12/18/2015) 28 capsule 0   No current facility-administered medications for this visit.    Functional Status:  In your present state of health, do you have any difficulty performing the following activities: 12/18/2015  Hearing? N  Vision? N  Difficulty concentrating or making decisions? N  Walking or climbing stairs? Y  Dressing or bathing? N  Doing errands, shopping? Y  Preparing Food and eating ? N  Using the Toilet? N  In the past six months, have you accidently leaked urine? N  Do you have problems with loss of bowel control? N  Managing your Medications? N  Managing your Finances? (No Data)  Housekeeping or managing your Housekeeping? (No Data)    Fall/Depression Screening: PHQ 2/9 Scores 12/18/2015 12/11/2015 08/03/2013 05/30/2013  PHQ - 2 Score 0 0 0 0   THN CM Care Plan Problem One        Most Recent Value   Care Plan Problem One  Knowledge deficit in self management of diabetes   Role Documenting the Problem One  [place] for Problem One  Active   THN Long Term Goal (31-90 days)  Patient will have blood sugars within the range of 80-140 within the next 90 days   THN Long Term Goal Start Date  12/18/15   Interventions for Problem One Long Term Goal  RN sent Emmi information on Controlling blood sugar.     THN CM Short Term Goal #1 (0-30 days)  Patient will be able to verbalize what the Hemoglobin A1C means within the next 30 days   THN CM Short Term Goal #1 Start Date  12/18/15   Interventions for Short Term Goal #1  How the A1c Helps  and The goal. RN discussed with patient on what the A1C shows. RN will have follow up discussion and teachback   THN CM Short Term Goal #2 (0-30 days)  Patient will be able to verbalize healthy foods and snacks   THN CM Short Term Goal #2 Start Date  12/18/15   Interventions for Short Term Goal #2  RN health sent a list of healthy snacks. Rn sent a chart on how to read the food labels. RN sent educational material on low sodium diet, low fat,and cholesterol  RN will follow up with discussion and teachback     Assessment:  Patient will benefit from [place] telephonic outreach for education and support for diabetes self management  Plan:  RN sent EMMI educational information on controlling blood sugar RN sent Educational information on Hgb A1C RN sent a healthy snack list RN sent a 2017 calendar book RN sent educational information on low sodium, low fat, and low cholesterol diet RN sent a chart on reading food labels RN will follow up outreach within a month  Christine Gay, BSN, Designer, television/film set Phone: [phone]

## 2015-12-22 DIAGNOSIS — H0289 Other specified disorders of eyelid: Secondary | ICD-10-CM | POA: Diagnosis not present

## 2015-12-22 DIAGNOSIS — H16223 Keratoconjunctivitis sicca, not specified as Sjogren's, bilateral: Secondary | ICD-10-CM | POA: Diagnosis not present

## 2015-12-22 DIAGNOSIS — H401132 Primary open-angle glaucoma, bilateral, moderate stage: Secondary | ICD-10-CM | POA: Diagnosis not present

## 2015-12-29 ENCOUNTER — Other Ambulatory Visit: Payer: Self-pay

## 2015-12-29 NOTE — Patient Outreach (Signed)
12/29/2015  Subjective Christine Gay is a 75 year old female who was referred to pharmacy for a medication review.  She has a past medical history of CAD, hyperlipidemia, GI bleed, GERD, Hypertension, chronic diastolic CHF, and glaucoma.  She did not report any issues with affording her medications or side effects.  She did not wish to speak to someone from the pharmacy team since she has her own pharmacist she speaks to when she has questions.   Objective Outpatient Encounter Prescriptions as of 12/29/2015  Medication Sig Note  . aspirin 81 MG tablet Take 81 mg by mouth daily.   . brimonidine-timolol (COMBIGAN) 0.2-0.5 % ophthalmic solution Place 1 drop into both eyes daily.    . carvedilol (COREG) 6.25 MG tablet Take 0.5 tablets (3.125 mg total) by mouth 2 (two) times daily.   . carvedilol (COREG) 6.25 MG tablet TAKE 1/2 TABLET BY MOUTH 2 TIMES A DAY WITH A MEAL.   Marland Kitchen CRESTOR 40 MG tablet TAKE 1 TABLET (40 MG TOTAL) BY MOUTH DAILY.   Marland Kitchen doxycycline (VIBRAMYCIN) 50 MG capsule Take 2 capsules (100 mg total) by mouth 2 (two) times daily. For 1 week (Patient not taking: Reported on 12/18/2015)   . ezetimibe (ZETIA) 10 MG tablet TAKE 1 TABLET BY MOUTH DAILY.   . ferrous sulfate 325 (65 FE) MG tablet Take 1 tablet (325 mg total) by mouth 2 (two) times daily with a meal.   . fluorometholone (FML) 0.1 % ophthalmic suspension Place 1 drop into both eyes daily.  07/29/2014: Received from: External Pharmacy  . furosemide (LASIX) 40 MG tablet TAKE 1 TABLET BY MOUTH 2 TIMES DAILY.   . isosorbide mononitrate (IMDUR) 60 MG 24 hr tablet TAKE 2 TABLETS BY MOUTH 2 TIMES DAILY.   Marland Kitchen levothyroxine (SYNTHROID, LEVOTHROID) 100 MCG tablet Take 100 mcg by mouth daily before breakfast.   . metFORMIN (GLUCOPHAGE-XR) 500 MG 24 hr tablet Take 500 mg by mouth 2 (two) times daily. 500 mg by mouth twice daily   . NITROSTAT 0.4 MG SL tablet PLACE 1 TABLET (0.4 MG TOTAL) UNDER THE TONGUE EVERY 5 (FIVE) MINUTES AS NEEDED FOR CHEST PAIN.    Marland Kitchen pantoprazole (PROTONIX) 40 MG tablet TAKE 1 TABLET BY MOUTH DAILY.   Marland Kitchen phenytoin (DILANTIN) 100 MG ER capsule Take 100 mg by mouth 3 (three) times daily.    . potassium chloride SA (K-DUR,KLOR-CON) 20 MEQ tablet TAKE 1 TABLET BY MOUTH EVERY MORNING AND 1/2 TABLET EVERY EVENING.    No facility-administered encounter medications on file as of 12/29/2015.   Assessment  Drugs sorted by system:  Neurologic/Psychologic: phenytoin  Cardiovascular: aspirin, carvedilol, crestor, isosorbide mononitrate, nitrostat, zetia  Pulmonary/Allergy: none  Gastrointestinal: pantoprazole  Endocrine: metformin, levothyroxine  Renal: none  Topical: none  Pain: none  Vitamins/Minerals: ferrous sulfate, potassium  Infectious Diseases: none  Miscellaneous: combigan, fluorometholone   Duplications in therapy: none  Gaps in therapy:  Currently not on ACE inhibitor or ARB for renal protection with Diabetes.  Her Micoalbumin was last monitor in 2013.  It would be beneficial to recheck to see if there has been any change.    Medications to avoid in the elderly: Pantoprazole:  With her history of a GI bleed, she needs to be on a protein pump inhibitor.    Drug interactions: none  Other issues noted: none  Plan: 1.  No major drug interaction or problems noted.   2.  It is recommended to recheck her microalbumin urine, microalbumin/Cr, protein urine or  protein/Cr since it has not been check in over 12 months.  This would help close a quality metric as well.  3.  I will close her case to pharmacy since she has no other pharmacy needs.  I would be happy to follow up if any other pharmacy needs arise.    Deanne Coffer, PharmD, [place] [phone]

## 2016-01-15 ENCOUNTER — Encounter: Payer: Self-pay | Admitting: *Deleted

## 2016-01-15 ENCOUNTER — Other Ambulatory Visit: Payer: Self-pay | Admitting: *Deleted

## 2016-01-15 ENCOUNTER — Ambulatory Visit: Payer: Self-pay | Admitting: *Deleted

## 2016-01-15 NOTE — Patient Outreach (Signed)
[facility] Care Management  [place]  01/15/2016   Christine Gay Sep 22, 1941 [id number]  Subjective: RN Health Coach telephone call to patient.  Hipaa compliance verified. Per patient she did not know her blood sugar. Per patient the machine is not working. Patient stated she needed to put new batteries in it. Daughter was at patient side. RN asked what size and they were triple A batteries. Patient  stated she had put new batteries and it still doesn't work. RN Health Coach stated she would call physician and get a new one ordered. Daughter stated that if the Dr called into the local pharmacy patient would have to pay. Daughter wanted to call the mail order pharmacy where diabetes equipment came from to see if they would replace 1st. Daughter stated that she would call the RN back if not able to get a new one.  Patient stated she has received the material sent but has not read all of it because she was getting her poem book published.    Objective:   Encounter Medications:  Outpatient Encounter Prescriptions as of 01/15/2016  Medication Sig Note  . aspirin 81 MG tablet Take 81 mg by mouth daily.   . brimonidine-timolol (COMBIGAN) 0.2-0.5 % ophthalmic solution Place 1 drop into both eyes daily.    . carvedilol (COREG) 6.25 MG tablet Take 0.5 tablets (3.125 mg total) by mouth 2 (two) times daily.   . carvedilol (COREG) 6.25 MG tablet TAKE 1/2 TABLET BY MOUTH 2 TIMES A DAY WITH A MEAL.   Marland Kitchen CRESTOR 40 MG tablet TAKE 1 TABLET (40 MG TOTAL) BY MOUTH DAILY.   Marland Kitchen doxycycline (VIBRAMYCIN) 50 MG capsule Take 2 capsules (100 mg total) by mouth 2 (two) times daily. For 1 week (Patient not taking: Reported on 12/18/2015)   . ezetimibe (ZETIA) 10 MG tablet TAKE 1 TABLET BY MOUTH DAILY.   . ferrous sulfate 325 (65 FE) MG tablet Take 1 tablet (325 mg total) by mouth 2 (two) times daily with a meal.   . fluorometholone (FML) 0.1 % ophthalmic suspension Place 1 drop into both eyes  daily.  07/29/2014: Received from: External Pharmacy  . furosemide (LASIX) 40 MG tablet TAKE 1 TABLET BY MOUTH 2 TIMES DAILY.   . isosorbide mononitrate (IMDUR) 60 MG 24 hr tablet TAKE 2 TABLETS BY MOUTH 2 TIMES DAILY.   Marland Kitchen levothyroxine (SYNTHROID, LEVOTHROID) 100 MCG tablet Take 100 mcg by mouth daily before breakfast.   . metFORMIN (GLUCOPHAGE-XR) 500 MG 24 hr tablet Take 500 mg by mouth 2 (two) times daily. 500 mg by mouth twice daily   . NITROSTAT 0.4 MG SL tablet PLACE 1 TABLET (0.4 MG TOTAL) UNDER THE TONGUE EVERY 5 (FIVE) MINUTES AS NEEDED FOR CHEST PAIN.   Marland Kitchen pantoprazole (PROTONIX) 40 MG tablet TAKE 1 TABLET BY MOUTH DAILY.   Marland Kitchen phenytoin (DILANTIN) 100 MG ER capsule Take 100 mg by mouth 3 (three) times daily.    . potassium chloride SA (K-DUR,KLOR-CON) 20 MEQ tablet TAKE 1 TABLET BY MOUTH EVERY MORNING AND 1/2 TABLET EVERY EVENING.    No facility-administered encounter medications on file as of 01/15/2016.    Functional Status:  In your present state of health, do you have any difficulty performing the following activities: 01/15/2016 12/18/2015  Hearing? N N  Vision? N N  Difficulty concentrating or making decisions? N N  Walking or climbing stairs? Y Y  Dressing or bathing? N N  Doing errands, shopping? Tempie Donning  Preparing Food and eating ? N N  Using the Toilet? N N  In the past six months, have you accidently leaked urine? - N  Do you have problems with loss of bowel control? N N  Managing your Medications? Y N  Managing your Finances? N (No Data)  Housekeeping or managing your Housekeeping? Y (No Data)    Fall/Depression Screening: PHQ 2/9 Scores 01/15/2016 12/18/2015 12/11/2015 08/03/2013 05/30/2013  PHQ - 2 Score 0 0 0 0 0   THN CM Care Plan Problem One        Most Recent Value   Care Plan Problem One  Knowledge deficit in self management of diabetes   Role Documenting the Problem One  [place] for Problem One  Active   THN Long Term Goal (31-90 days)  Patient  will have blood sugars within the range of 80-140 within the next 90 days   THN Long Term Goal Start Date  01/15/16 [continue. Patient didn't  make RN Health Coach aware that meter is [place]]   Interventions for Problem One Long Term Goal  RN sent Emmi information on Controlling blood sugar. RN discussed with daughter on obtaining a new meter immediately. Daughter is calling the mail order company to see if they will replace .  If not she will call [place] back   THN CM Short Term Goal #1 (0-30 days)  Patient will be able to verbalize what the Hemoglobin A1C means within the next 30 days   THN CM Short Term Goal #1 Start Date  01/15/16 [continue/ Patient stated she hasn't gotten to read all the materials yet]   Interventions for Short Term Goal #1  RN sent How the A1c Helps  and The goal. RN discussed with patient on what the A1C shows. RN will have follow up discussion and teachback   THN CM Short Term Goal #2 (0-30 days)  Patient will be able to verbalize healthy foods and snacks   THN CM Short Term Goal #2 Start Date  -- [continue. Per patient she has not gotten to read all the material yet]   Interventions for Short Term Goal #2  RN health sent a list of healthy snacks. Rn sent a chart on how to read the food labels. RN sent educational material on low sodium diet, low fat,and cholesterol  RN will follow up with discussion and teachback      Assessment:  Patient will continue to  benefit from [employer] telephonic outreach for education and support for diabetes self management. Patient does not have a working glucometer Patient has not been checking her blood sugars  Plan:  RN will send patient EMMI educational material on why check your blood sugar RN will send patient educational material on Why Blood Glucose is a problem RN will send patient educational material on Diabetes and Cardiovascular risk RN will send patient educational material on Diabetes and Reducing your risk of  complications RN will send patient educational material on Diabetes and Heart Failure, Stroke, Damage Nerves and Kidneys RN will follow up within a month for discussion and teach back  [place] Management [phone]

## 2016-01-17 IMAGING — DX DG CHEST 2V
2 series · 2 of 2 positions shown · non-contrast
Comparison: 06/10/2014

CLINICAL DATA: Cough, shortness of Breath

EXAM:
CHEST  2 VIEW

[chest pa]
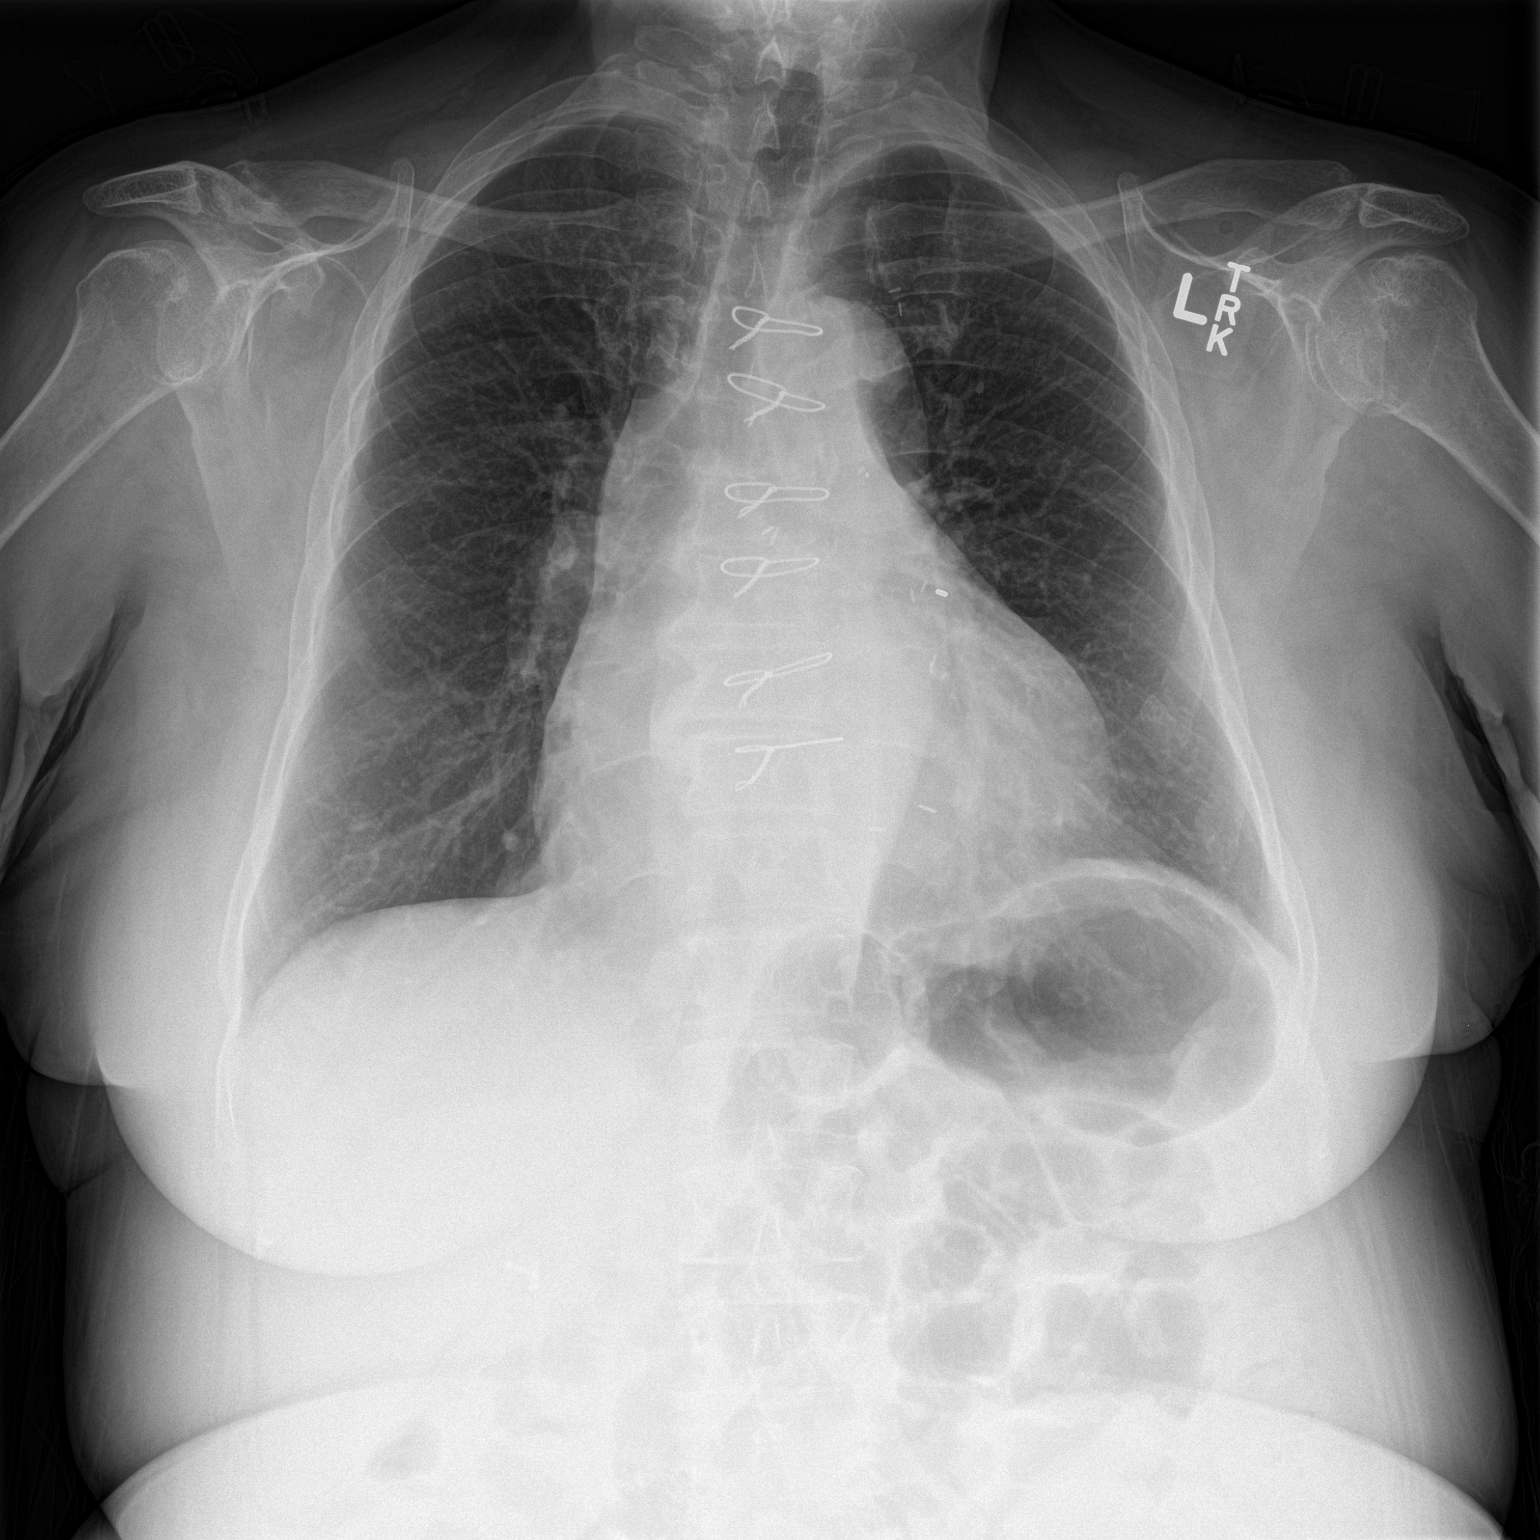

[chest lat]
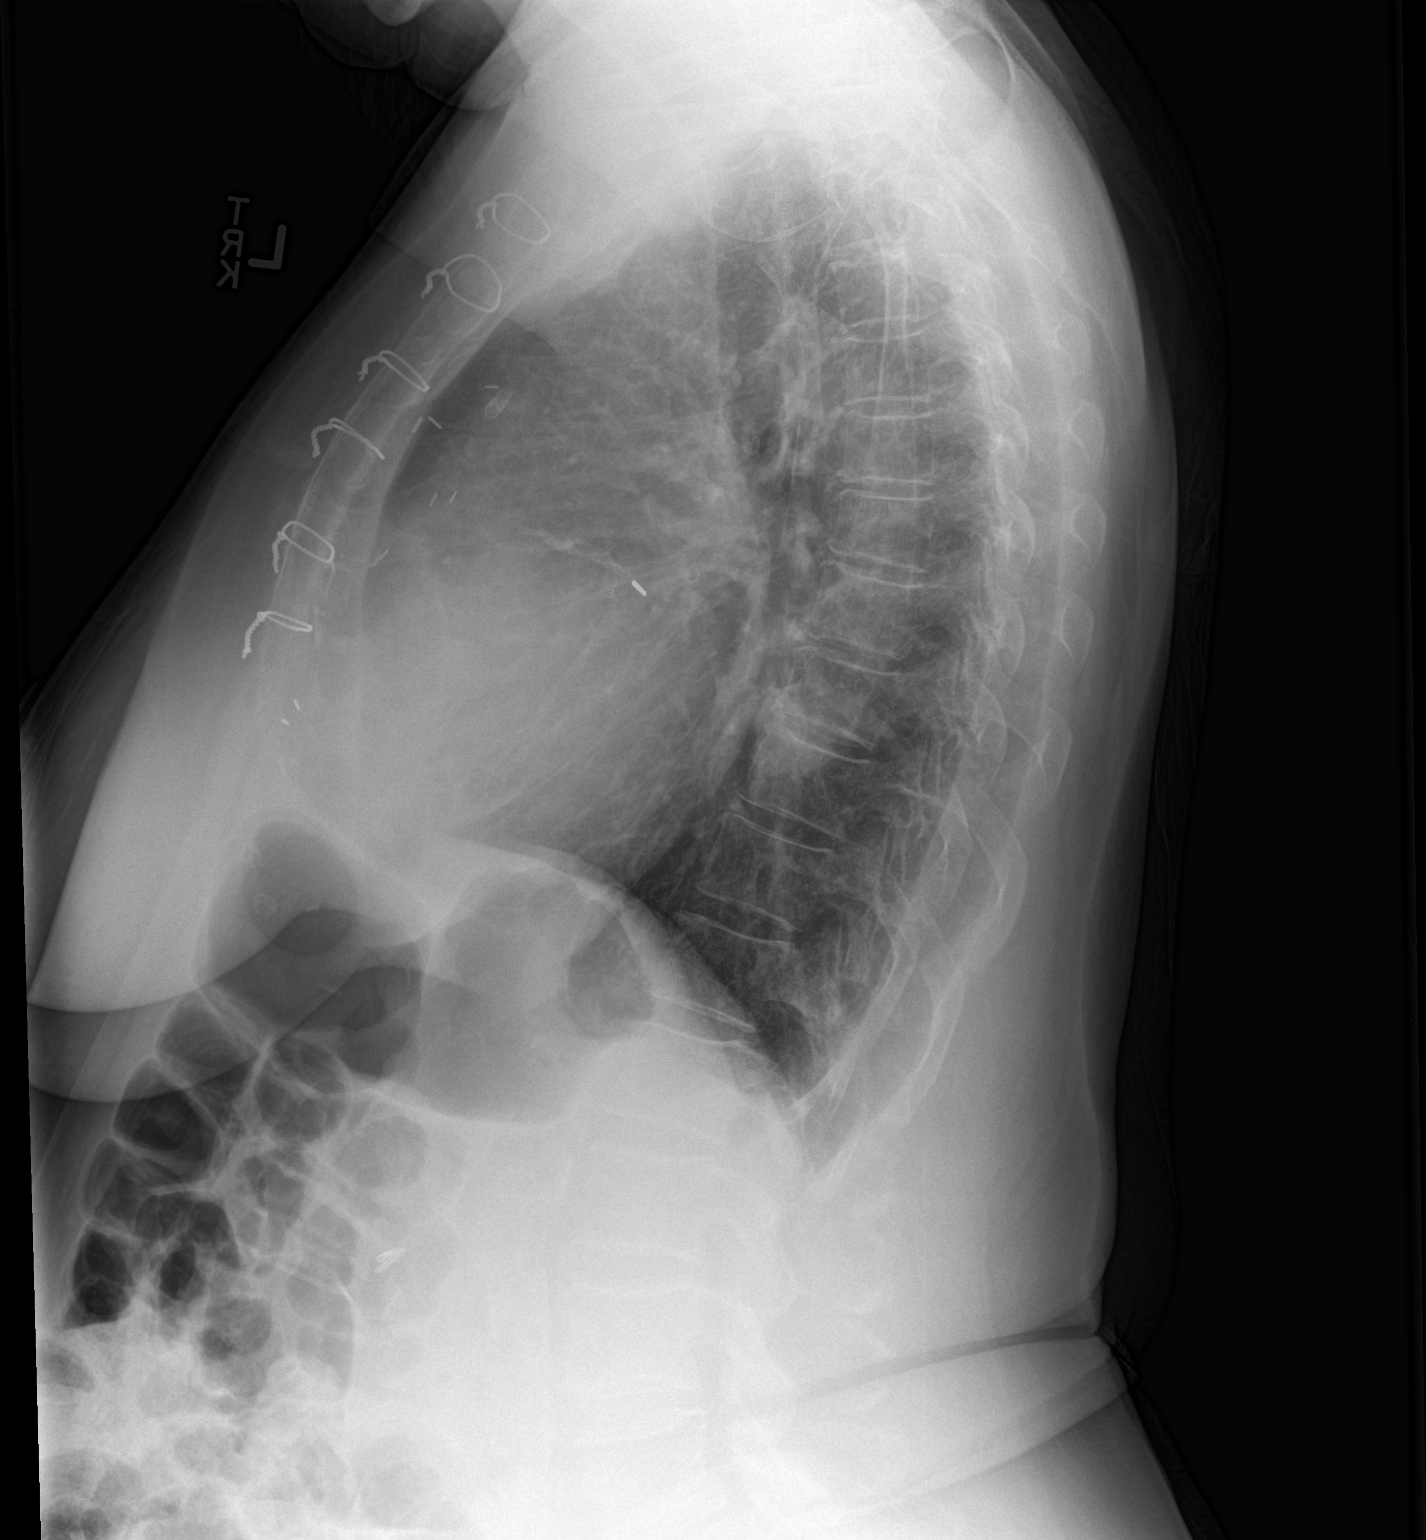

[2 of 2 positions shown; findings below may reference images not displayed]

FINDINGS: Cardiomegaly again noted. Status post median sternotomy. No acute
infiltrate or pleural effusion. No pulmonary edema. Bony thorax is
unremarkable.
IMPRESSION: No active cardiopulmonary disease.

## 2016-02-04 ENCOUNTER — Ambulatory Visit (INDEPENDENT_AMBULATORY_CARE_PROVIDER_SITE_OTHER): Payer: Medicare Other | Admitting: Podiatry

## 2016-02-04 ENCOUNTER — Encounter: Payer: Self-pay | Admitting: Podiatry

## 2016-02-04 DIAGNOSIS — Q828 Other specified congenital malformations of skin: Secondary | ICD-10-CM

## 2016-02-04 DIAGNOSIS — M79676 Pain in unspecified toe(s): Secondary | ICD-10-CM | POA: Diagnosis not present

## 2016-02-04 DIAGNOSIS — B351 Tinea unguium: Secondary | ICD-10-CM

## 2016-02-04 NOTE — Progress Notes (Signed)
Patient ID: Christine Gay, female   DOB: 09/24/1941, 75 y.o.   MRN: [id number] Complaint:  Visit Type: Patient returns to my office for continued preventative foot care services. Complaint: Patient states" my nails have grown long and thick and become painful to walk and wear shoes" Patient has been diagnosed with DM with no foot complications. The patient presents for preventative foot care services. No changes to ROS  Podiatric Exam: Vascular: dorsalis pedis and posterior tibial pulses are palpable bilateral. Capillary return is immediate. Temperature gradient is WNL. Skin turgor WNL  Sensorium: Normal Semmes Weinstein monofilament test. Normal tactile sensation bilaterally. Nail Exam: Pt has thick disfigured discolored nails with subungual debris noted bilateral entire nail hallux through fifth toenails Ulcer Exam: There is no evidence of ulcer or pre-ulcerative changes or infection. Orthopedic Exam: Muscle tone and strength are WNL. No limitations in general ROM. No crepitus or effusions noted. Foot type and digits show no abnormalities. Bony prominences are unremarkable. HAV 1st  MPJ B/L.  Hammer toes 2 B/l Skin: No Porokeratosis. No infection or ulcers.  Callus sub 5th B/L  Diagnosis:  Onychomycosis, , Pain in right toe, pain in left toes, Debride porokeratosis B/l  Treatment & Plan Procedures and Treatment: Consent by patient was obtained for treatment procedures. The patient understood the discussion of treatment and procedures well. All questions were answered thoroughly reviewed. Debridement of mycotic and hypertrophic toenails, 1 through 5 bilateral and clearing of subungual debris. No ulceration, no infection noted. . Return Visit-Office Procedure: Patient instructed to return to the office for a follow up visit 3 months for continued evaluation and treatment.  Gardiner Barefoot DPM

## 2016-02-12 ENCOUNTER — Ambulatory Visit: Payer: Self-pay | Admitting: *Deleted

## 2016-02-12 ENCOUNTER — Other Ambulatory Visit: Payer: Self-pay | Admitting: *Deleted

## 2016-02-12 IMAGING — CR DG CHEST 2V
2 series · 2 of 2 positions shown · non-contrast
Comparison: 03/14/2015

CLINICAL DATA: Chest pain

EXAM:
CHEST  2 VIEW

[chest pa]
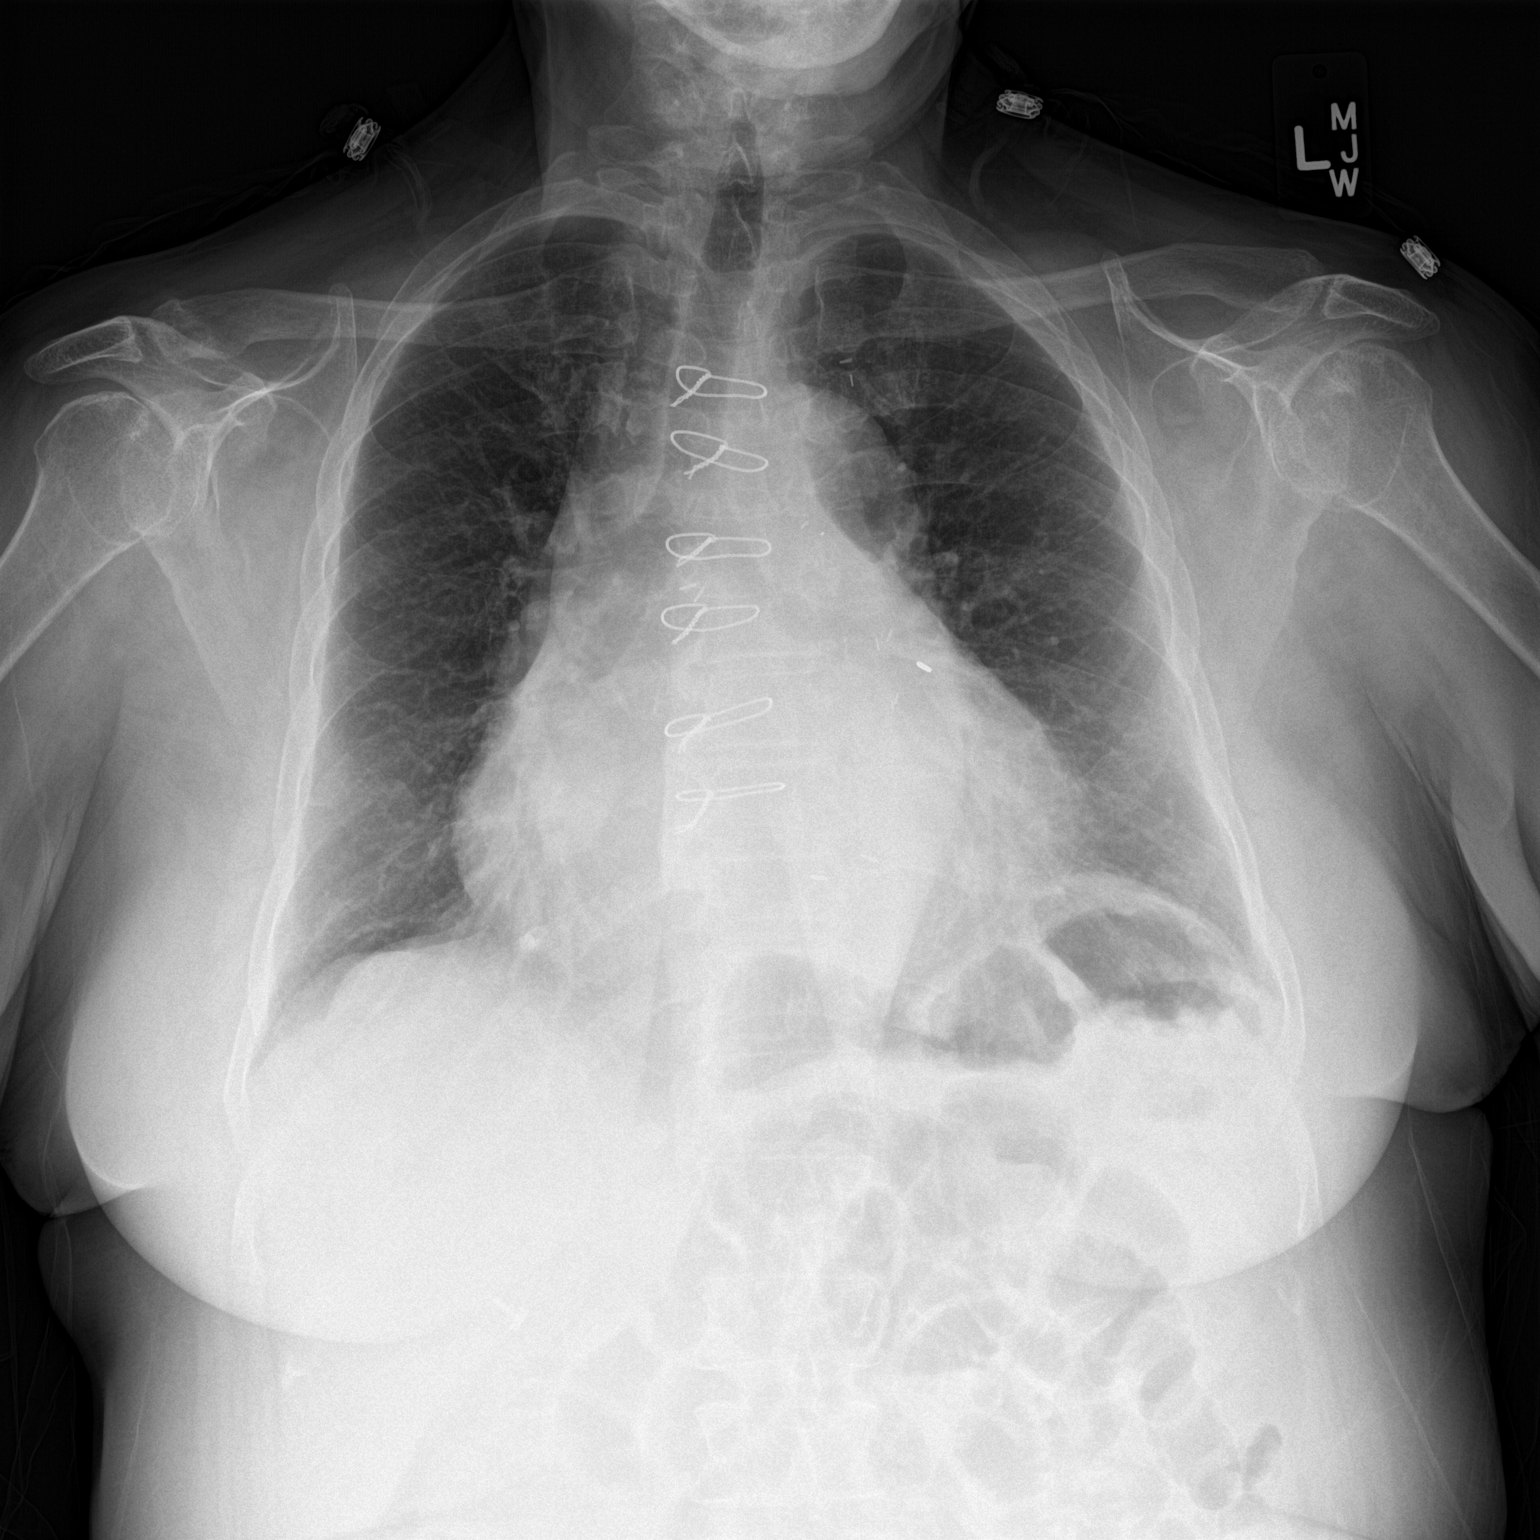

[chest lat]
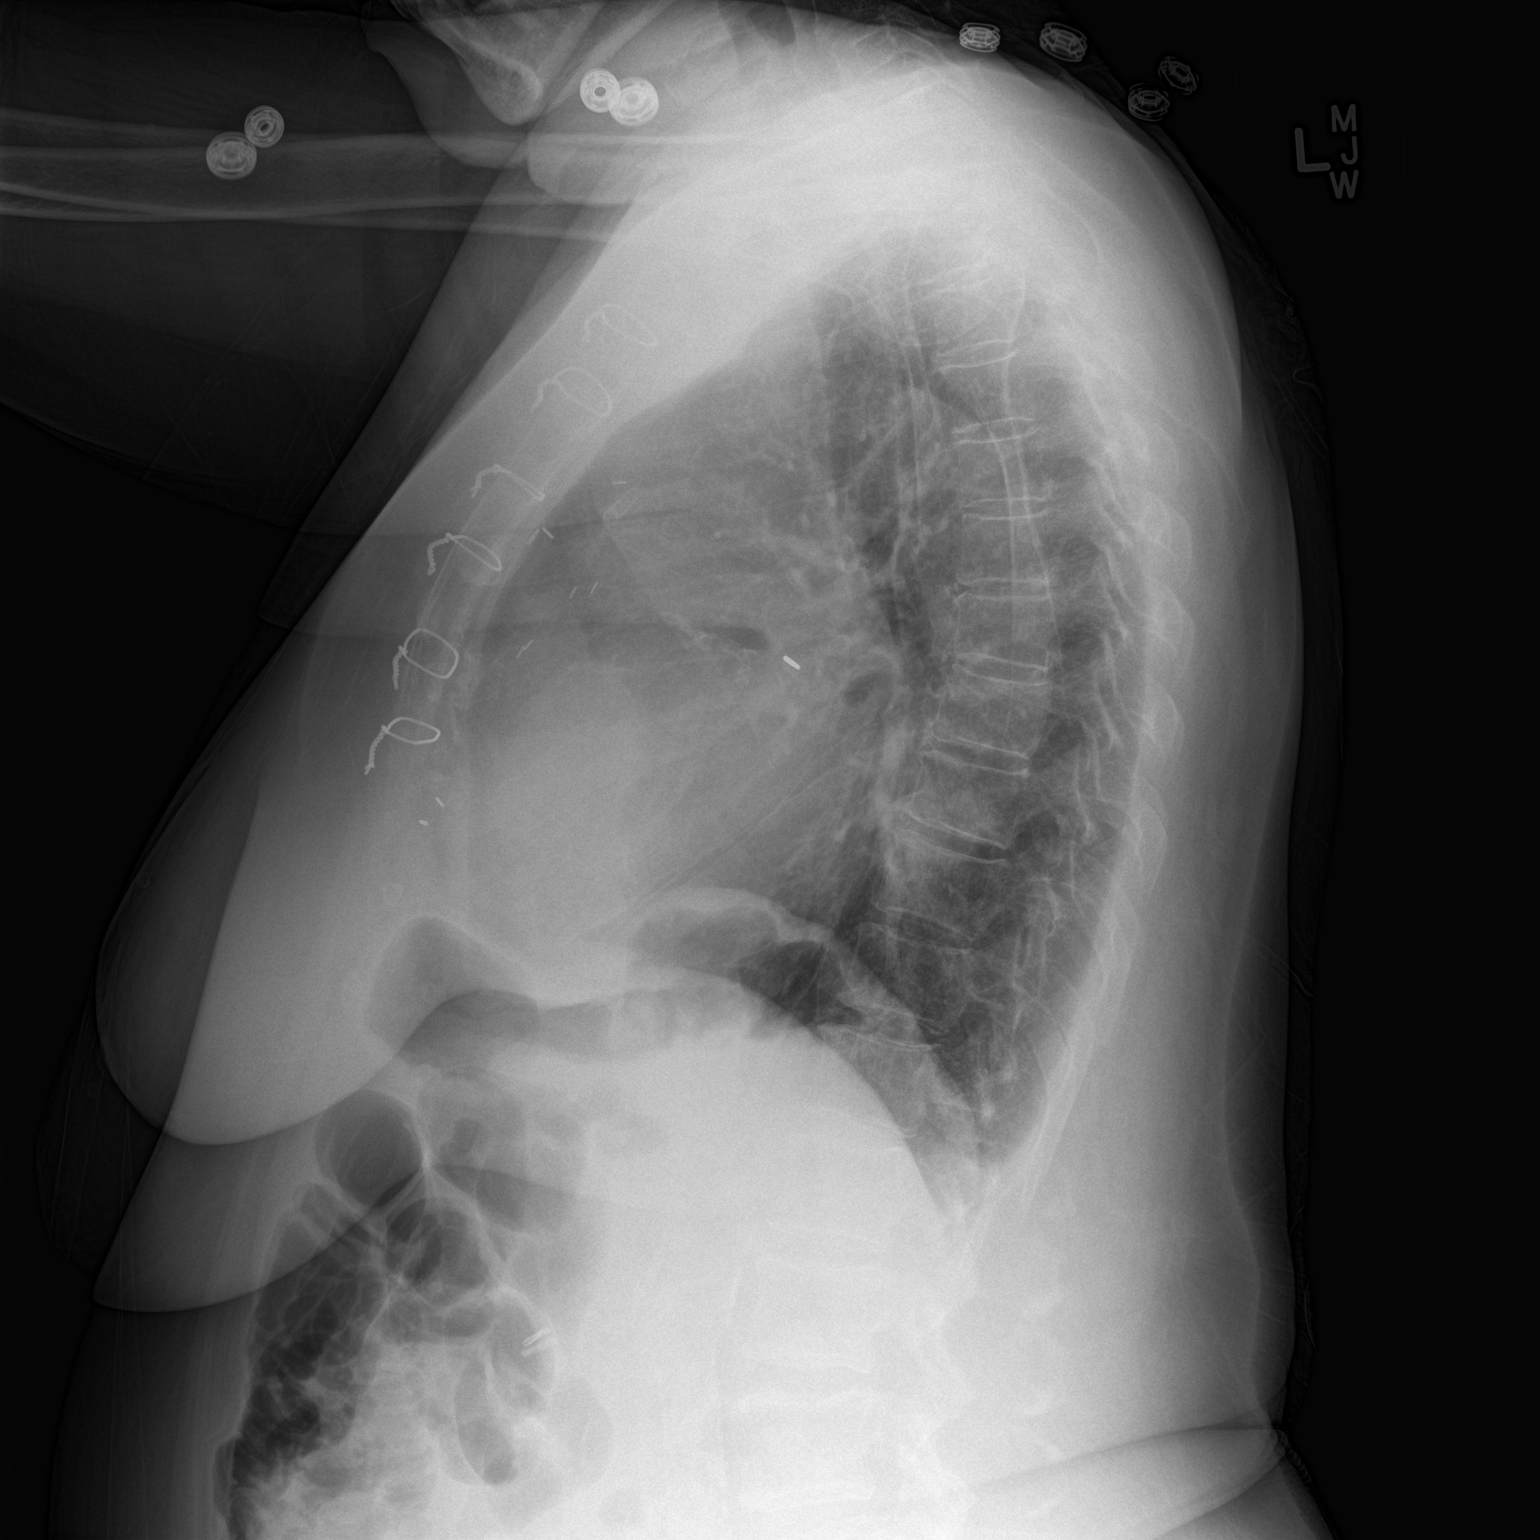

[2 of 2 positions shown; findings below may reference images not displayed]

FINDINGS: Moderate enlargement of the cardiopericardial silhouette with
tortuous thoracic aorta. Prior CABG.

No edema.  Bony demineralization.

No pleural effusion.  Coronary stent noted.
IMPRESSION: 1. Moderate enlargement of the cardiopericardial silhouette, without
edema.
2. Tortuous thoracic aorta.  Prior CABG.

## 2016-02-12 NOTE — Patient Outreach (Signed)
[facility]) Care Management  02/12/2016  Janautica Desantiago Sima 1940-11-29 [id number]   RN Health Coach  Attempted #1  Follow up outreach call to patient.  Patient was unavailable. HIPPA compliance voicemail message was left with return callback number.    [place] Care Management [phone]

## 2016-02-17 ENCOUNTER — Other Ambulatory Visit: Payer: Self-pay | Admitting: *Deleted

## 2016-02-17 ENCOUNTER — Ambulatory Visit: Payer: Self-pay | Admitting: *Deleted

## 2016-02-17 NOTE — Patient Outreach (Signed)
[facility]) Care Management  02/17/2016  Christine Gay 1940/12/06 [id number]  RN Health Coach attempted 2nd Follow up outreach call to patient.  Patient was unavailable. HIPPA compliance voicemail message was left with return callback number.    [place] Care Management [phone]

## 2016-02-19 ENCOUNTER — Other Ambulatory Visit: Payer: Self-pay | Admitting: Cardiology

## 2016-02-19 NOTE — Telephone Encounter (Signed)
Rx refill sent to pharmacy. 

## 2016-03-01 ENCOUNTER — Encounter: Payer: Self-pay | Admitting: *Deleted

## 2016-03-01 ENCOUNTER — Other Ambulatory Visit: Payer: Self-pay | Admitting: *Deleted

## 2016-03-01 NOTE — Patient Outreach (Signed)
[facility]) Care Management  [place]  03/01/2016   Jenni Sponaugle Bantz 02/24/1941 [id number]  Subjective:  RN Health Coach telephone call to patient.  Hipaa compliance verified. Per patient she has been trying to call RN and hasn't heard back . RN gave the patient the dates calls made to her and patient stated Oh she has a new telephone and doesn't know how to retrieve the messages. Patient will have her daughter to show her how to use her voice mail.  RN and patient discussed how elevated blood glucose can affect the body. RN started with the brain, eyes, heart, kidneys. Patient had difficulty understanding why she needed to go to the dentist. RN explained how diabetes affect the gums and teeth. How going untreated infections can affect other parts of the body Patient had questions about what the A1C meant. RN explained in full detail and made her aware that she would be sending her additional information on it also. Patient wanted to know about what the normal blood pressure is. RN discussed with patient about knowing the goals of her blood glucose , Blood pressure, cholesterol and blood lipids.  Patient is keeping everything down in the book sent to her by RN. Per patient she feels more comfortable after talking with the Health Coach. Patient stated she had questions but she didn't know how to ask the questions. Now she fully understood the information as  the Health coach explained. Patient has agreed to follow up outreach calls     Objective:   Encounter Medications:  Outpatient Encounter Prescriptions as of 03/01/2016  Medication Sig Note  . aspirin 81 MG tablet Take 81 mg by mouth daily.   . brimonidine-timolol (COMBIGAN) 0.2-0.5 % ophthalmic solution Place 1 drop into both eyes daily.    . carvedilol (COREG) 6.25 MG tablet Take 0.5 tablets (3.125 mg total) by mouth 2 (two) times daily.   . carvedilol (COREG) 6.25 MG tablet TAKE 1/2 TABLET BY MOUTH 2 TIMES A DAY  WITH A MEAL.   Marland Kitchen CRESTOR 40 MG tablet TAKE 1 TABLET (40 MG TOTAL) BY MOUTH DAILY.   Marland Kitchen doxycycline (VIBRAMYCIN) 50 MG capsule Take 2 capsules (100 mg total) by mouth 2 (two) times daily. For 1 week   . ezetimibe (ZETIA) 10 MG tablet TAKE 1 TABLET BY MOUTH DAILY.   . ferrous sulfate 325 (65 FE) MG tablet Take 1 tablet (325 mg total) by mouth 2 (two) times daily with a meal.   . fluorometholone (FML) 0.1 % ophthalmic suspension Place 1 drop into both eyes daily.  07/29/2014: Received from: External Pharmacy  . furosemide (LASIX) 40 MG tablet TAKE 1 TABLET BY MOUTH 2 TIMES DAILY.   . isosorbide mononitrate (IMDUR) 60 MG 24 hr tablet TAKE 2 TABLETS BY MOUTH 2 TIMES DAILY.   Marland Kitchen levothyroxine (SYNTHROID, LEVOTHROID) 100 MCG tablet Take 100 mcg by mouth daily before breakfast.   . metFORMIN (GLUCOPHAGE-XR) 500 MG 24 hr tablet Take 500 mg by mouth 2 (two) times daily. 500 mg by mouth twice daily   . NITROSTAT 0.4 MG SL tablet PLACE 1 TABLET (0.4 MG TOTAL) UNDER THE TONGUE EVERY 5 (FIVE) MINUTES AS NEEDED FOR CHEST PAIN.   Marland Kitchen pantoprazole (PROTONIX) 40 MG tablet TAKE 1 TABLET BY MOUTH DAILY.   Marland Kitchen phenytoin (DILANTIN) 100 MG ER capsule Take 100 mg by mouth 3 (three) times daily.    . potassium chloride SA (K-DUR,KLOR-CON) 20 MEQ tablet TAKE 1 TABLET BY MOUTH  EVERY MORNING AND 1/2 TABLET EVERY EVENING.    No facility-administered encounter medications on file as of 03/01/2016.    Functional Status:  In your present state of health, do you have any difficulty performing the following activities: 03/01/2016 01/15/2016  Hearing? N N  Vision? N N  Difficulty concentrating or making decisions? N N  Walking or climbing stairs? Y Y  Dressing or bathing? N N  Doing errands, shopping? Tempie Donning  Preparing Food and eating ? N N  Using the Toilet? N N  In the past six months, have you accidently leaked urine? N -  Do you have problems with loss of bowel control? N N  Managing your Medications? Y Y  Managing your Finances?  N N  Housekeeping or managing your Housekeeping? Tempie Donning    Fall/Depression Screening: PHQ 2/9 Scores 03/01/2016 01/15/2016 12/18/2015 12/11/2015 08/03/2013 05/30/2013  PHQ - 2 Score 0 0 0 0 0 0    Assessment:  Patient is having some difficulty comprehending and needs to be explained more in depth.   Plan:  RN sent EMMI on Understanding the cholesterol level and highlighted important information RN sent educational material on How A1C test helps RN sent educational material on monitoring your blood pressure at home RN highlighted the important facts RN will follow up within 30 days for discussion and teach back  [place] Management [phone]

## 2016-03-08 DIAGNOSIS — R569 Unspecified convulsions: Secondary | ICD-10-CM | POA: Diagnosis not present

## 2016-03-08 DIAGNOSIS — I10 Essential (primary) hypertension: Secondary | ICD-10-CM | POA: Diagnosis not present

## 2016-03-08 DIAGNOSIS — E039 Hypothyroidism, unspecified: Secondary | ICD-10-CM | POA: Diagnosis not present

## 2016-03-08 DIAGNOSIS — E663 Overweight: Secondary | ICD-10-CM | POA: Diagnosis not present

## 2016-03-08 DIAGNOSIS — I2510 Atherosclerotic heart disease of native coronary artery without angina pectoris: Secondary | ICD-10-CM | POA: Diagnosis not present

## 2016-03-08 DIAGNOSIS — Z6833 Body mass index (BMI) 33.0-33.9, adult: Secondary | ICD-10-CM | POA: Diagnosis not present

## 2016-03-08 DIAGNOSIS — E139 Other specified diabetes mellitus without complications: Secondary | ICD-10-CM | POA: Diagnosis not present

## 2016-03-08 DIAGNOSIS — M810 Age-related osteoporosis without current pathological fracture: Secondary | ICD-10-CM | POA: Diagnosis not present

## 2016-03-09 DIAGNOSIS — N6001 Solitary cyst of right breast: Secondary | ICD-10-CM | POA: Diagnosis not present

## 2016-03-29 ENCOUNTER — Other Ambulatory Visit: Payer: Self-pay | Admitting: *Deleted

## 2016-03-29 NOTE — Patient Outreach (Signed)
[facility]) Care Management  03/29/2016  Christine Gay Hellen 1941/04/29 [id number]   RN Health Coach  attempted #1  Follow up outreach call to patient.  Patient was unavailable. HIPPA compliance voicemail message was left with return callback number.   Plan: RN will call patient again within 14 days.  [place] Care Management [phone]

## 2016-04-09 ENCOUNTER — Ambulatory Visit: Payer: Self-pay | Admitting: *Deleted

## 2016-04-12 ENCOUNTER — Other Ambulatory Visit: Payer: Self-pay | Admitting: Cardiology

## 2016-04-12 ENCOUNTER — Encounter (HOSPITAL_COMMUNITY): Payer: Self-pay

## 2016-04-13 ENCOUNTER — Other Ambulatory Visit: Payer: Self-pay | Admitting: *Deleted

## 2016-04-13 NOTE — Patient Outreach (Signed)
[facility]) Care Management  04/13/2016  Christine Gay 01/05/1941 [id number]  RN Health Coach attempted #2  Follow up outreach call to patient.  Patient was unavailable. HIPPA compliance voicemail message was left with return callback number.  Plan: RN will call patient again within 14 days.    [place] Care Management [phone]

## 2016-04-26 ENCOUNTER — Encounter: Payer: Self-pay | Admitting: *Deleted

## 2016-04-26 ENCOUNTER — Other Ambulatory Visit: Payer: Self-pay | Admitting: *Deleted

## 2016-04-26 DIAGNOSIS — E1110 Type 2 diabetes mellitus with ketoacidosis without coma: Secondary | ICD-10-CM

## 2016-04-26 DIAGNOSIS — K0889 Other specified disorders of teeth and supporting structures: Secondary | ICD-10-CM

## 2016-04-26 NOTE — Patient Outreach (Signed)
[facility]) Care Management  04/26/2016   Christine Gay 1941-01-14 [id number]  Subjective: RN Health Coach telephone call to patient.  Hipaa compliance verified. Per patient her fasting blood sugar is 134 this am. Patient is having a lot of pain with her tooth. She had made a dental appointment for this  Wednesday. She called the transportation service used for medicare and was told she had to call 3 days in advance and they could not take her. Patient stated she has been eating soft foods and soup for 2 days.  Patient had to call  Dentist and get appointment rescheduled.  Patient has not had any falls within the past outreach call. Patient has an exercise routine that she does. Patient stated she has been reading the information [place] has sent. Patient has agreed to follow up outreach call.   Objective:   Current Medications:  Current Outpatient Prescriptions  Medication Sig Dispense Refill  . aspirin 81 MG tablet Take 81 mg by mouth daily.    . brimonidine-timolol (COMBIGAN) 0.2-0.5 % ophthalmic solution Place 1 drop into both eyes daily.     . carvedilol (COREG) 6.25 MG tablet Take 0.5 tablets (3.125 mg total) by mouth 2 (two) times daily.    . carvedilol (COREG) 6.25 MG tablet TAKE 1/2 TABLET BY MOUTH 2 TIMES A DAY WITH A MEAL. 90 tablet 3  . doxycycline (VIBRAMYCIN) 50 MG capsule Take 2 capsules (100 mg total) by mouth 2 (two) times daily. For 1 week 28 capsule 0  . ezetimibe (ZETIA) 10 MG tablet TAKE 1 TABLET BY MOUTH DAILY. 90 tablet 3  . ferrous sulfate 325 (65 FE) MG tablet Take 1 tablet (325 mg total) by mouth 2 (two) times daily with a meal. 90 tablet 0  . fluorometholone (FML) 0.1 % ophthalmic suspension Place 1 drop into both eyes daily.   6  . furosemide (LASIX) 40 MG tablet TAKE 1 TABLET BY MOUTH 2 TIMES DAILY. 180 tablet 3  . isosorbide mononitrate (IMDUR) 60 MG 24 hr tablet TAKE 2 TABLETS BY MOUTH 2 TIMES DAILY. 360 tablet 1  . levothyroxine  (SYNTHROID, LEVOTHROID) 100 MCG tablet Take 100 mcg by mouth daily before breakfast.    . metFORMIN (GLUCOPHAGE-XR) 500 MG 24 hr tablet Take 500 mg by mouth 2 (two) times daily. 500 mg by mouth twice daily    . NITROSTAT 0.4 MG SL tablet PLACE 1 TABLET (0.4 MG TOTAL) UNDER THE TONGUE EVERY 5 (FIVE) MINUTES AS NEEDED FOR CHEST PAIN. 25 tablet 11  . pantoprazole (PROTONIX) 40 MG tablet TAKE 1 TABLET BY MOUTH DAILY. 90 tablet 2  . phenytoin (DILANTIN) 100 MG ER capsule Take 100 mg by mouth 3 (three) times daily.     . potassium chloride SA (K-DUR,KLOR-CON) 20 MEQ tablet TAKE 1 TABLET BY MOUTH EVERY MORNING AND 1/2 TABLET EVERY EVENING. 135 tablet 0  . rosuvastatin (CRESTOR) 40 MG tablet TAKE 1 TABLET BY MOUTH DAILY. 90 tablet 1   No current facility-administered medications for this visit.     Functional Status:  In your present state of health, do you have any difficulty performing the following activities: 04/26/2016 03/01/2016  Hearing? N N  Vision? N N  Difficulty concentrating or making decisions? N N  Walking or climbing stairs? Y Y  Dressing or bathing? N N  Doing errands, shopping? Tempie Donning  Preparing Food and eating ? N N  Using the Toilet? N N  In the past six  months, have you accidently leaked urine? N N  Do you have problems with loss of bowel control? N N  Managing your Medications? Y Y  Managing your Finances? N N  Housekeeping or managing your Housekeeping? Tempie Donning  Some recent data might be hidden    Fall/Depression Screening: PHQ 2/9 Scores 04/26/2016 03/01/2016 01/15/2016 12/18/2015 12/11/2015 08/03/2013 05/30/2013  PHQ - 2 Score 0 0 0 0 0 0 0   THN CM Care Plan Problem One   Flowsheet Row Most Recent Value  Care Plan Problem One  Knowledge deficit in self management of diabetes  Role Documenting the Problem One  [place] for Problem One  Active  THN Long Term Goal (31-90 days)  Patient will have blood sugars within the range of 80-140 within the next 90 days  THN Long  Term Goal Start Date  04/26/16  Interventions for Problem One Long Term Goal  RN sent Emmi information on Controlling blood sugar. RN discussed with daughter on obtaining a new meter immediately. Daughter is calling the mail order company to see if they will replace .  If not she will call [place] back. patient has meter and is tking blood sugars  THN CM Short Term Goal #1 (0-30 days)  Patient will be able to verbalize what the Hemoglobin A1C means within the next 30 days  THN CM Short Term Goal #1 Met Date  04/26/16  Interventions for Short Term Goal #1  RN sent How the A1c Helps  and The goal. RN discussed with patient on what the A1C shows. RN will have follow up discussion and teachback  THN CM Short Term Goal #2 (0-30 days)  Patient will be able to verbalize healthy foods and snacks  THN CM Short Term Goal #2 Start Date  04/26/16  Interventions for Short Term Goal #2  RN health sent a list of healthy snacks. Rn sent a chart on how to read the food labels. RN sent educational material on low sodium diet, low fat,and cholesterol  RN will follow up with discussion and teachback  THN CM Short Term Goal #4 (0-30 days)  Patient will be able to verbalize that she is able to keep her aapointments with her physicians within the next 30 days  THN CM Short Term Goal #4 Start Date  04/26/16  Interventions for Short Term Goal #4  RN will refer patient to social worker for transportation issues.       Assessment:  Patient has a toothache Patient is having transportation issues Patient will continue to  benefit from [place] telephonic outreach for education and support for diabetes self management.  Plan:  RN referred to social worker for transportation issues RN sent patient a Neurosurgeon on your physical health RN sent patient educational material on When you are sick RN will follow up with patient within the month of August  [facility] Gay Care  Management [phone]

## 2016-04-29 ENCOUNTER — Other Ambulatory Visit: Payer: Self-pay | Admitting: *Deleted

## 2016-04-29 NOTE — Patient Outreach (Signed)
[facility]) Care Management  04/29/2016  Birttany Dechellis Rothenberger [birth year] [id number]   CSW received a new referral on patient from [name], Telephonic Nurse Case Manager with [facility], indicating that patient would benefit from social work services and resources to assist with transportation to and from her physician appointments.  More specifically, Mrs. Christine Gay indicated that patient was requesting transportation assistance to her dentist appointment on Wednesday, August 2nd.  The referral was sent on Tuesday, August 1st, but CSW did not open the referral until Thursday, August 3rd.  Unfortunately, it is too late for CSW to try and arrange transportation services for patient.  However, CSW has agreed to mail patient a packet of resource information, including all of the following transportation services: Medicaid Transportation through the [place] through [employer] of [place] - [phone] [facility] [phone] [place] will also mail patient a [employer] Paramedic) application, as well as a Dentist. CSW will refrain from opening patient's case at this time, as all goals of treatment have been met from social work standpoint and no additional social work needs have been identified at this time.  CSW will notify patient's RNCM with [place] [facility], [name] of CSW's plans to close patient's case.  CSW will fax an update to patient's Primary Care Physician, Dr. Seward Carol to ensure that they are aware of CSW's involvement with patient's plan of care.  CSW will submit a case closure request to Verlon Setting, Care Management Assistant with [facility], in the form of an [employer].   Nat Christen, BSW, MSW, LCSW  [facility]  Mailing [place] [address], [place], [place] [address] Physical Address-[address], [place], [place] [address] Toll Free Main # [phone] Fax # [phone] Cell # [phone]  Fax # [phone]  [email or web address]

## 2016-05-05 ENCOUNTER — Ambulatory Visit: Payer: Medicare Other | Admitting: Podiatry

## 2016-05-13 ENCOUNTER — Ambulatory Visit: Payer: Medicare Other | Admitting: Podiatry

## 2016-05-18 ENCOUNTER — Other Ambulatory Visit: Payer: Self-pay | Admitting: Cardiology

## 2016-05-18 ENCOUNTER — Ambulatory Visit (HOSPITAL_COMMUNITY)
Admission: RE | Admit: 2016-05-18 | Discharge: 2016-05-18 | Disposition: A | Discharge status: Home / Self Care | Payer: Medicare Other | Source: Ambulatory Visit | Attending: Cardiology | Admitting: Cardiology

## 2016-05-18 VITALS — BP 120/58 | HR 51 | Wt 170.0 lb

## 2016-05-18 DIAGNOSIS — H409 Unspecified glaucoma: Secondary | ICD-10-CM | POA: Diagnosis not present

## 2016-05-18 DIAGNOSIS — Z955 Presence of coronary angioplasty implant and graft: Secondary | ICD-10-CM | POA: Diagnosis not present

## 2016-05-18 DIAGNOSIS — I6523 Occlusion and stenosis of bilateral carotid arteries: Secondary | ICD-10-CM | POA: Insufficient documentation

## 2016-05-18 DIAGNOSIS — E785 Hyperlipidemia, unspecified: Secondary | ICD-10-CM | POA: Insufficient documentation

## 2016-05-18 DIAGNOSIS — Z7984 Long term (current) use of oral hypoglycemic drugs: Secondary | ICD-10-CM | POA: Insufficient documentation

## 2016-05-18 DIAGNOSIS — G40909 Epilepsy, unspecified, not intractable, without status epilepticus: Secondary | ICD-10-CM | POA: Diagnosis not present

## 2016-05-18 DIAGNOSIS — I5032 Chronic diastolic (congestive) heart failure: Secondary | ICD-10-CM | POA: Insufficient documentation

## 2016-05-18 DIAGNOSIS — E039 Hypothyroidism, unspecified: Secondary | ICD-10-CM | POA: Diagnosis not present

## 2016-05-18 DIAGNOSIS — K219 Gastro-esophageal reflux disease without esophagitis: Secondary | ICD-10-CM | POA: Diagnosis not present

## 2016-05-18 DIAGNOSIS — Z951 Presence of aortocoronary bypass graft: Secondary | ICD-10-CM | POA: Diagnosis not present

## 2016-05-18 DIAGNOSIS — Z79899 Other long term (current) drug therapy: Secondary | ICD-10-CM | POA: Diagnosis not present

## 2016-05-18 DIAGNOSIS — I110 Hypertensive heart disease with heart failure: Secondary | ICD-10-CM | POA: Diagnosis not present

## 2016-05-18 DIAGNOSIS — I2510 Atherosclerotic heart disease of native coronary artery without angina pectoris: Secondary | ICD-10-CM | POA: Diagnosis not present

## 2016-05-18 DIAGNOSIS — Z7982 Long term (current) use of aspirin: Secondary | ICD-10-CM | POA: Diagnosis not present

## 2016-05-18 DIAGNOSIS — E119 Type 2 diabetes mellitus without complications: Secondary | ICD-10-CM | POA: Insufficient documentation

## 2016-05-18 DIAGNOSIS — E7800 Pure hypercholesterolemia, unspecified: Secondary | ICD-10-CM | POA: Diagnosis not present

## 2016-05-18 LAB — BASIC METABOLIC PANEL
ANION GAP: 8 (ref 5–15)
BUN: 11 mg/dL (ref 6–20)
CALCIUM: 8.5 mg/dL — AB (ref 8.9–10.3)
CO2: 23 mmol/L (ref 22–32)
CREATININE: 0.88 mg/dL (ref 0.44–1.00)
Chloride: 108 mmol/L (ref 101–111)
GFR calc Af Amer: >60 mL/min (ref >60)
GFR calc non Af Amer: >60 mL/min (ref >60)
GLUCOSE: 106 mg/dL — AB (ref 65–99)
Potassium: 5 mmol/L (ref 3.5–5.1)
Sodium: 139 mmol/L (ref 135–145)

## 2016-05-18 LAB — LIPID PANEL
CHOL/HDL RATIO: 2.9 ratio
Cholesterol: 145 mg/dL (ref 0–200)
HDL: 50 mg/dL (ref >40)
LDL Cholesterol: 83 mg/dL (ref 0–99)
Triglycerides: 62 mg/dL (ref <150)
VLDL: 12 mg/dL (ref 0–40)

## 2016-05-18 LAB — CBC
HCT: 32.9 % — ABNORMAL LOW (ref 36.0–46.0)
HEMOGLOBIN: 10.7 g/dL — AB (ref 12.0–15.0)
MCH: 32.1 pg (ref 26.0–34.0)
MCHC: 32.5 g/dL (ref 30.0–36.0)
MCV: 98.8 fL (ref 78.0–100.0)
Platelets: 188 10*3/uL (ref 150–400)
RBC: 3.33 MIL/uL — ABNORMAL LOW (ref 3.87–5.11)
RDW: 12.2 % (ref 11.5–15.5)
WBC: 7.7 10*3/uL (ref 4.0–10.5)

## 2016-05-18 LAB — BRAIN NATRIURETIC PEPTIDE: B NATRIURETIC PEPTIDE 5: 207.9 pg/mL — AB (ref 0.0–100.0)

## 2016-05-18 NOTE — Patient Instructions (Signed)
Labs today  Your physician wants you to follow-up in: 6 months You will receive a reminder letter in the mail two months in advance. If you don't receive a letter, please call our office to schedule the follow-up appointment.  Do the following things EVERYDAY: 1) Weigh yourself in the morning before breakfast. Write it down and keep it in a log. 2) Take your medicines as prescribed 3) Eat low salt foods-Limit salt (sodium) to 2000 mg per day.  4) Stay as active as you can everyday 5) Limit all fluids for the day to less than 2 liters

## 2016-05-18 NOTE — Progress Notes (Signed)
Patient ID: Christine Gay, female   DOB: July 20, 1941, 75 y.o.   MRN: [id number] PCP: Dr. Delfina Redwood Cardiology: Dr. Aundra Dubin  75 yo with history of CAD s/p CABG, DM, and HTN presents for cardiology followup.  She had a Lexiscan Cardiolite in 2014 that was an intermediate risk study and echo showed basal to mid inferior hypokinesis with EF 55-60%.  I did a cardaic catheterization in 8/14 that showed patent LIMA-LAD, occluded SVG-OM1, and 95% ostial LCx stenosis.  She had PCI to ostial LCx with Promus DES.  Cardiolite in 5/15 showed no ischemia or infarction.  She had an upper GI bleed in 9/15, she is now off Plavix. No melena or BRBPR.  Lexiscan Cardiolite in 2/16 showed a small, mid anterior partially reversible defect that may have been due to soft tissue attenuation (low risk) and echo showed EF 55-60%.   Breathing is generally doing ok.  She is short of breath walking up hills or inclines. No orthopnea/PND.  She uses a cane for balance.  She tries to walk daily.  She has had very little chest pain since last visit.  She used 2 NTGs about 2-3 weeks ago in the setting of a stressful situation with family, but has had none since.  She has published a book of poetry.  ECG: NSR, 1st degree AVB, LBBB 138 msec    Labs (3/13): K 4.2, creatinine 0.77 Labs (6/14): LDL 80, HDL 69 Labs (8/14): BNP 971 => 420 => 138, creatinine 0.8, K 4.3 Labs (11/14): K 4.1, creatinine 0.8, BNP 88 Labs (3/15): K 3.4, creatinine 0.86, LDL 85 Labs (9/15): K 3.2, creatinine 0.63, hgb 8 Labs (1/16): LDL 96 Labs (2/16): K 4.2, creatinine 0.89 Labs (3/16): LDL 91, HDL 56 Labs (7/16): TSH normal, K 4.7, creatinine 0.9, hgb 11.4, LDL 93  PMH: 1. GERD 2. Seizure disorder 3. Type II diabetes 4. Chronic LBBB 5. CAD: s/p CABG in 1992, had PCI several years ago.  Echo (2/11) with EF 55%, moderate LVH, aortic sclerosis.  LHC (8/14) with patent LIMA-LAD, totally occluded SVG-OM1, 95% ostial LCx, totally occluded small nondominant RCA  with EF 55%.  Patient had Promus DES to pLCx. Lexiscan Cardiolite (5/15) with EF 57%, mild breast attenuation, no ischemia or infarction.  Lexiscan Cardiolite (2/16) with EF 44%, small partially reversible mid anterior perfusion defect may be due to soft tissue attenuation.   6. Glaucoma 7. Hypothyroidism 8. HTN 9. Cholecystectomy 10. Diastolic CHF: Echo (123XX123) with EF 55-60%, basal to mid inferior hypokinesis, mild MR and mild AI. Echo (2/16) with EF 55-60%, mild LAE.  11. Carotid stenosis: Carotid dopplers (6/15) with 40-59% bilateral ICA stenosis.  - Carotid dopplers (123XX123) with 123456 LICA stenosis.  12. GI bleed 9/15: EGD showed a polypoid gastric lesion that was friable, benign pathology.   SH: Widow, lives in [place], does not smoke.   FH: No premature CAD.  Diabetes.   ROS: All systems reviewed and negative except as per HPI.   Current Outpatient Prescriptions  Medication Sig Dispense Refill  . aspirin 81 MG tablet Take 81 mg by mouth daily.    . brimonidine-timolol (COMBIGAN) 0.2-0.5 % ophthalmic solution Place 1 drop into both eyes daily.     . carvedilol (COREG) 6.25 MG tablet Take 0.5 tablets (3.125 mg total) by mouth 2 (two) times daily.    Marland Kitchen ezetimibe (ZETIA) 10 MG tablet TAKE 1 TABLET BY MOUTH DAILY. 90 tablet 3  . ferrous sulfate 325 (65 FE) MG tablet Take 1  tablet (325 mg total) by mouth 2 (two) times daily with a meal. 90 tablet 0  . fluorometholone (FML) 0.1 % ophthalmic suspension Place 1 drop into both eyes daily.   6  . furosemide (LASIX) 40 MG tablet TAKE 1 TABLET BY MOUTH 2 TIMES DAILY. 180 tablet 3  . isosorbide mononitrate (IMDUR) 60 MG 24 hr tablet TAKE 2 TABLETS BY MOUTH 2 TIMES DAILY. 360 tablet 1  . levothyroxine (SYNTHROID, LEVOTHROID) 100 MCG tablet Take 100 mcg by mouth daily before breakfast.    . metFORMIN (GLUCOPHAGE-XR) 500 MG 24 hr tablet Take 500 mg by mouth 2 (two) times daily. 500 mg by mouth twice daily    . NITROSTAT 0.4 MG SL tablet PLACE 1  TABLET (0.4 MG TOTAL) UNDER THE TONGUE EVERY 5 (FIVE) MINUTES AS NEEDED FOR CHEST PAIN. 25 tablet 11  . pantoprazole (PROTONIX) 40 MG tablet TAKE 1 TABLET BY MOUTH DAILY. 90 tablet 2  . phenytoin (DILANTIN) 100 MG ER capsule Take 100 mg by mouth 3 (three) times daily.     . potassium chloride SA (K-DUR,KLOR-CON) 20 MEQ tablet TAKE 1 TABLET BY MOUTH EVERY MORNING AND 1/2 TABLET EVERY EVENING. 135 tablet 0  . rosuvastatin (CRESTOR) 40 MG tablet TAKE 1 TABLET BY MOUTH DAILY. 90 tablet 1   No current facility-administered medications for this encounter.     BP (!) 120/58 (BP Location: Right Arm, Patient Position: Sitting, Cuff Size: Normal)   Pulse (!) 51   Wt 170 lb (77.1 kg)   SpO2 98%   BMI 33.20 kg/m  General: NAD Neck: No JVD, no thyromegaly or thyroid nodule.  Lungs: CTAB  CV: Nondisplaced PMI.  Heart regular S1/S2, no S3/S4, 2/6 early SEM.  1+ ankle edema.  Right carotid bruit.   Abdomen: Soft, nontender, no hepatosplenomegaly, no distention.  Skin: Intact without lesions or rashes.  Neurologic: Alert and oriented x 3.  Psych: Normal affect. Extremities: No clubbing or cyanosis.   Assessment/Plan: 1. CAD: Status post Promus DES to ostial LCx in 8/14.  Lexiscan Cardiolite in 5/15 showed no ischemia or infarction. Lexiscan Cardiolite 2/16 showed probably mild anterior attenuation and was low risk but EF 44%.  However, echo done near this time showed normal EF 55-60%.  She has had minimal chest pain since last appointment. - Continue Imdur and Coreg at current doses.  - She is not a ranolazine candidate given use of Dilantin.  - Continue ASA 81 and statin.  2. Diastolic CHF: EF 0000000 on last echo in 2/16 (suspect this is more accurate than EF 44% by Cardiolite done close to the same time).  NYHA class II-III symptoms, stable.  She is not significantly volume overloaded on exam.  - Continue Lasix 40 mg bid.   - BMET/BNP today.  3. Hyperlipidemia: She is on Zetia and Crestor.  Check  lipids today.   4. Carotid bruit: Repeat carotid dopplers in 6/18.   5. H/o GI bleeding: I will check CBC today.   [name] 05/18/2016

## 2016-05-19 ENCOUNTER — Other Ambulatory Visit: Payer: Self-pay | Admitting: Cardiology

## 2016-05-20 ENCOUNTER — Ambulatory Visit (INDEPENDENT_AMBULATORY_CARE_PROVIDER_SITE_OTHER): Payer: Medicare Other | Admitting: Podiatry

## 2016-05-20 ENCOUNTER — Encounter: Payer: Self-pay | Admitting: Podiatry

## 2016-05-20 DIAGNOSIS — M79676 Pain in unspecified toe(s): Secondary | ICD-10-CM | POA: Diagnosis not present

## 2016-05-20 DIAGNOSIS — E1140 Type 2 diabetes mellitus with diabetic neuropathy, unspecified: Secondary | ICD-10-CM

## 2016-05-20 DIAGNOSIS — B351 Tinea unguium: Secondary | ICD-10-CM | POA: Diagnosis not present

## 2016-05-20 DIAGNOSIS — Q828 Other specified congenital malformations of skin: Secondary | ICD-10-CM

## 2016-05-20 NOTE — Progress Notes (Signed)
Patient ID: Christine Gay, female   DOB: 23-Apr-1941, 75 y.o.   MRN: [id number] Complaint:  Visit Type: Patient returns to my office for continued preventative foot care services. Complaint: Patient states" my nails have grown long and thick and become painful to walk and wear shoes" Patient has been diagnosed with DM with no foot complications. The patient presents for preventative foot care services. No changes to ROS  Podiatric Exam: Vascular: dorsalis pedis and posterior tibial pulses are palpable bilateral. Capillary return is immediate. Temperature gradient is WNL. Skin turgor WNL  Sensorium: Normal Semmes Weinstein monofilament test. Normal tactile sensation bilaterally. Nail Exam: Pt has thick disfigured discolored nails with subungual debris noted bilateral entire nail hallux through fifth toenails Ulcer Exam: There is no evidence of ulcer or pre-ulcerative changes or infection. Orthopedic Exam: Muscle tone and strength are WNL. No limitations in general ROM. No crepitus or effusions noted. Foot type and digits show no abnormalities. Bony prominences are unremarkable. HAV 1st  MPJ B/L.  Hammer toes 2 B/l Skin: No Porokeratosis. No infection or ulcers.  Callus sub 5th B/L  Diagnosis:  Onychomycosis, , Pain in right toe, pain in left toes, Debride porokeratosis B/l  Treatment & Plan Procedures and Treatment: Consent by patient was obtained for treatment procedures. The patient understood the discussion of treatment and procedures well. All questions were answered thoroughly reviewed. Debridement of mycotic and hypertrophic toenails, 1 through 5 bilateral and clearing of subungual debris. No ulceration, no infection noted. . Return Visit-Office Procedure: Patient instructed to return to the office for a follow up visit 3 months for continued evaluation and treatment.  Gardiner Barefoot DPM

## 2016-05-26 ENCOUNTER — Encounter: Payer: Self-pay | Admitting: *Deleted

## 2016-05-26 ENCOUNTER — Other Ambulatory Visit: Payer: Self-pay | Admitting: *Deleted

## 2016-05-26 NOTE — Patient Outreach (Signed)
[facility]) Care Management  05/26/2016   Christine Gay 1941/05/04 [id number]  Subjective: RN Health Coach telephone call to patient.  Hip[employer] compliance verified. This member  Hgb A1C is 5.2.  Her fasting blood sugar this morning ran 107. Per patient she is feeling real good. Patient stated she had stopped taking her calcium. Explained to patient it is a little low and patient stated she will start taking it again. Patient was discussing how she was not sure about the blood sugar dropping since her A1C is low. RN discussed if she sees it low she needs to make her Dr aware. The daughter was in the background and stated the Dr said he might have to make some changes.  Patient has new source of transportation. Patient is still doing daily exercise of walking before 8:30 am to stay out of the heat.Patient has agreed to follow up out reach calls.    Objective:   Current Medications:  Current Outpatient Prescriptions  Medication Sig Dispense Refill  . aspirin 81 MG tablet Take 81 mg by mouth daily.    . brimonidine-timolol (COMBIGAN) 0.2-0.5 % ophthalmic solution Place 1 drop into both eyes daily.     . carvedilol (COREG) 6.25 MG tablet Take 0.5 tablets (3.125 mg total) by mouth 2 (two) times daily.    Marland Kitchen ezetimibe (ZETIA) 10 MG tablet TAKE 1 TABLET BY MOUTH DAILY. 90 tablet 3  . ferrous sulfate 325 (65 FE) MG tablet Take 1 tablet (325 mg total) by mouth 2 (two) times daily with a meal. 90 tablet 0  . fluorometholone (FML) 0.1 % ophthalmic suspension Place 1 drop into both eyes daily.   6  . furosemide (LASIX) 40 MG tablet TAKE 1 TABLET BY MOUTH 2 TIMES DAILY. 180 tablet 3  . isosorbide mononitrate (IMDUR) 60 MG 24 hr tablet TAKE 2 TABLETS BY MOUTH 2 TIMES A DAY. 120 tablet 3  . levothyroxine (SYNTHROID, LEVOTHROID) 100 MCG tablet Take 100 mcg by mouth daily before breakfast.    . metFORMIN (GLUCOPHAGE-XR) 500 MG 24 hr tablet Take 500 mg by mouth 2 (two) times daily. 500 mg by  mouth twice daily    . NITROSTAT 0.4 MG SL tablet PLACE 1 TABLET (0.4 MG TOTAL) UNDER THE TONGUE EVERY 5 (FIVE) MINUTES AS NEEDED FOR CHEST PAIN. 25 tablet 11  . pantoprazole (PROTONIX) 40 MG tablet TAKE 1 TABLET BY MOUTH DAILY. 90 tablet 2  . phenytoin (DILANTIN) 100 MG ER capsule Take 100 mg by mouth 3 (three) times daily.     . potassium chloride SA (K-DUR,KLOR-CON) 20 MEQ tablet Take 1 tablet every morning and 1/2 tablet every evening. 135 tablet 3  . rosuvastatin (CRESTOR) 40 MG tablet TAKE 1 TABLET BY MOUTH DAILY. 90 tablet 1   No current facility-administered medications for this visit.     Functional Status:  In your present state of health, do you have any difficulty performing the following activities: 05/26/2016 04/26/2016  Hearing? N N  Vision? Y N  Difficulty concentrating or making decisions? N N  Walking or climbing stairs? Y Y  Dressing or bathing? N N  Doing errands, shopping? Tempie Donning  Preparing Food and eating ? N N  Using the Toilet? N N  In the past six months, have you accidently leaked urine? N N  Do you have problems with loss of bowel control? N N  Managing your Medications? Y Y  Managing your Finances? N N  Housekeeping or managing your  Housekeeping? Tempie Donning  Some recent data might be hidden    Fall/Depression Screening: PHQ 2/9 Scores 05/26/2016 04/26/2016 03/01/2016 01/15/2016 12/18/2015 12/11/2015 08/03/2013  PHQ - 2 Score 0 0 0 0 0 0 0   THN CM Care Plan Problem One   Flowsheet Row Most Recent Value  Care Plan Problem One  Knowledge deficit in self management of diabetes  Role Documenting the Problem One  [place] for Problem One  Active  THN Long Term Goal (31-90 days)  Patient will have blood sugars within the range of 80-140 within the next 90 days  THN Long Term Goal Met Date  05/26/16  Interventions for Problem One Long Term Goal  RN sent Emmi information on Controlling blood sugar. RN discussed with daughter on obtaining a new meter immediately.  Daughter is calling the mail order company to see if they will replace .  If not she will call Lester back. patient has meter and is tking blood sugars  THN CM Short Term Goal #2 (0-30 days)  Patient will be able to verbalize healthy foods and snacks  THN CM Short Term Goal #2 Met Date  05/26/16  Interventions for Short Term Goal #2  RN health sent a list of healthy snacks. Rn sent a chart on how to read the food labels. RN sent educational material on low sodium diet, low fat,and cholesterol  RN will follow up with discussion and teachback  THN CM Short Term Goal #3 (0-30 days)  Patient will be able to recognize hypoglycemic reaction qickly with A!C 5.2 and blood sugar ranges below 120 within the next 30 days  THN CM Short Term Goal #3 Start Date  05/26/16  Interventions for Short Tern Goal #3  RN resent EMMI educational material on low and high blood sugars . RN sent picture chart of high and low blood sugars  THN CM Short Term Goal #4 Met Date  05/26/16      Assessment: HGB A1C 5.2/ Patient will benefit from [place] telephonic outreach for education and support for diabetes self management.   Plan:  RN sent EMMI educational material on high and low blood sugar RN sent patient picture chart of high and low blood sugar RN discussed healthy checkup  RN sent education on Heat exhaustion and rehydration in the elderly RN will call patient within the month of October  Osvaldo Lamping [place] Care Management [phone]

## 2016-05-29 ENCOUNTER — Other Ambulatory Visit: Payer: Self-pay | Admitting: Cardiology

## 2016-07-01 ENCOUNTER — Other Ambulatory Visit: Payer: Self-pay | Admitting: Cardiology

## 2016-07-08 DIAGNOSIS — R569 Unspecified convulsions: Secondary | ICD-10-CM | POA: Diagnosis not present

## 2016-07-08 DIAGNOSIS — Z7984 Long term (current) use of oral hypoglycemic drugs: Secondary | ICD-10-CM | POA: Diagnosis not present

## 2016-07-08 DIAGNOSIS — I2510 Atherosclerotic heart disease of native coronary artery without angina pectoris: Secondary | ICD-10-CM | POA: Diagnosis not present

## 2016-07-08 DIAGNOSIS — E663 Overweight: Secondary | ICD-10-CM | POA: Diagnosis not present

## 2016-07-08 DIAGNOSIS — R262 Difficulty in walking, not elsewhere classified: Secondary | ICD-10-CM | POA: Diagnosis not present

## 2016-07-08 DIAGNOSIS — Z6833 Body mass index (BMI) 33.0-33.9, adult: Secondary | ICD-10-CM | POA: Diagnosis not present

## 2016-07-08 DIAGNOSIS — E7800 Pure hypercholesterolemia, unspecified: Secondary | ICD-10-CM | POA: Diagnosis not present

## 2016-07-08 DIAGNOSIS — E039 Hypothyroidism, unspecified: Secondary | ICD-10-CM | POA: Diagnosis not present

## 2016-07-08 DIAGNOSIS — M810 Age-related osteoporosis without current pathological fracture: Secondary | ICD-10-CM | POA: Diagnosis not present

## 2016-07-08 DIAGNOSIS — E1122 Type 2 diabetes mellitus with diabetic chronic kidney disease: Secondary | ICD-10-CM | POA: Diagnosis not present

## 2016-07-08 DIAGNOSIS — I10 Essential (primary) hypertension: Secondary | ICD-10-CM | POA: Diagnosis not present

## 2016-07-08 DIAGNOSIS — Z23 Encounter for immunization: Secondary | ICD-10-CM | POA: Diagnosis not present

## 2016-07-10 ENCOUNTER — Other Ambulatory Visit: Payer: Self-pay | Admitting: Cardiology

## 2016-07-26 ENCOUNTER — Other Ambulatory Visit: Payer: Self-pay | Admitting: *Deleted

## 2016-07-26 NOTE — Patient Outreach (Signed)
[facility]) Care Management  07/26/2016  Christine Gay July 30, 1941 [id number]    RN Health Coach attempted #1 Follow up outreach call to patient.  Patient was unavailable. HIPPA compliance voicemail message was left with return callback number.   Plan: RN will call patient again within 14 days.  [place] Care Management [phone]

## 2016-07-27 DIAGNOSIS — H16223 Keratoconjunctivitis sicca, not specified as Sjogren's, bilateral: Secondary | ICD-10-CM | POA: Diagnosis not present

## 2016-07-27 DIAGNOSIS — H401132 Primary open-angle glaucoma, bilateral, moderate stage: Secondary | ICD-10-CM | POA: Diagnosis not present

## 2016-07-27 DIAGNOSIS — H0289 Other specified disorders of eyelid: Secondary | ICD-10-CM | POA: Diagnosis not present

## 2016-08-09 ENCOUNTER — Other Ambulatory Visit: Payer: Self-pay | Admitting: *Deleted

## 2016-08-09 NOTE — Patient Outreach (Signed)
[facility]) Care Management    [facility]) Care Management  10/01/2016  Late entry addendum   Christine Gay 02-15-1941 [id number]  [place] attempted  Follow up outreach call to patient.  Patient was unavailable. HIPPA compliance voicemail message was left with return callback number. Patient returned call while RN was closing case. Notes was not entered. Patient stated she went  For eye exam on [id number]. She received her flu shot. Her blood sugar was 121-122. The highest she has had was 130. Per patient she has not had any low blood sugar counts. Per patient she was walking and fell flat. Per patient she had no injuries. Per patient she has not been eating any fried foods. Patient has agreed to foloow up outreach calls.   Objective:   Current Medications:  Current Outpatient Prescriptions  Medication Sig Dispense Refill  . aspirin 81 MG tablet Take 81 mg by mouth daily.    . carvedilol (COREG) 6.25 MG tablet Take 0.5 tablets (3.125 mg total) by mouth 2 (two) times daily.    Marland Kitchen ezetimibe (ZETIA) 10 MG tablet TAKE 1 TABLET BY MOUTH DAILY. 90 tablet 3  . ferrous sulfate 325 (65 FE) MG tablet Take 1 tablet (325 mg total) by mouth 2 (two) times daily with a meal. (Patient taking differently: Take 325 mg by mouth daily with breakfast. ) 90 tablet 0  . furosemide (LASIX) 40 MG tablet TAKE 1 TABLET BY MOUTH 2 TIMES DAILY. 180 tablet 4  . isosorbide mononitrate (IMDUR) 60 MG 24 hr tablet TAKE 2 TABLETS BY MOUTH 2 TIMES A DAY. 966 tablet 3  . levothyroxine (SYNTHROID, LEVOTHROID) 100 MCG tablet Take 100 mcg by mouth daily before breakfast.    . metFORMIN (GLUCOPHAGE-XR) 500 MG 24 hr tablet Take 500 mg by mouth 2 (two) times daily. 500 mg by mouth twice daily    . NITROSTAT 0.4 MG SL tablet PLACE 1 TABLET (0.4 MG TOTAL) UNDER THE TONGUE EVERY 5 (FIVE) MINUTES AS NEEDED FOR CHEST PAIN. 25 tablet 11  . pantoprazole (PROTONIX) 40 MG tablet TAKE 1  TABLET BY MOUTH DAILY. 90 tablet 2  . phenytoin (DILANTIN) 100 MG ER capsule Take 100-200 mg by mouth 2 (two) times daily. Takes 2 caps in am and 1 cap in pm    . potassium chloride SA (K-DUR,KLOR-CON) 20 MEQ tablet Take 1 tablet every morning and 1/2 tablet every evening. 135 tablet 3  . rosuvastatin (CRESTOR) 40 MG tablet TAKE 1 TABLET BY MOUTH DAILY. 90 tablet 1  . acetaminophen (TYLENOL) 325 MG tablet Take 1-2 tablets (325-650 mg total) by mouth every 4 (four) hours as needed for mild pain.    . cycloSPORINE (RESTASIS) 0.05 % ophthalmic emulsion Place 1 drop into both eyes 2 (two) times daily.    . timolol (TIMOPTIC) 0.5 % ophthalmic solution Place 1 drop into both eyes daily.  6   No current facility-administered medications for this visit.     Functional Status:  In your present state of health, do you have any difficulty performing the following activities: 09/29/2016 09/28/2016  Hearing? N N  Vision? N N  Difficulty concentrating or making decisions? N N  Walking or climbing stairs? Y Y  Dressing or bathing? N N  Doing errands, shopping? N -  Preparing Food and eating ? - -  Using the Toilet? - -  In the past six months, have you accidently leaked urine? - -  Do you have  problems with loss of bowel control? - -  Managing your Medications? - -  Managing your Finances? - -  Housekeeping or managing your Housekeeping? - -  Some recent data might be hidden    Fall/Depression Screening: PHQ 2/9 Scores 08/09/2016 05/26/2016 04/26/2016 03/01/2016 01/15/2016 12/18/2015 12/11/2015  PHQ - 2 Score 0 0 0 0 0 0 0   THN CM Care Plan Problem One   Flowsheet Row Most Recent Value  Care Plan Problem One  Knowledge deficit in self management of diabetes  Role Documenting the Problem One  [place] for Problem One  Active  THN Long Term Goal (31-90 days)  Patient will have blood sugars within the range of 80-140 within the next 90 days  THN CM Short Term Goal #3 (0-30 days)  Patient  will be able to recognize hypoglycemic reaction qickly with A!C 5.2 and blood sugar ranges below 120 within the next 30 days  THN CM Short Term Goal #3 Met Date  08/09/16  Interventions for Short Tern Goal #3  RN resent EMMI educational material on low and high blood sugars . RN sent picture chart of high and low blood sugars  THN CM Short Term Goal #5 (0-30 days)  Patient will be able to verbalize portion control within the next within the next 30 days  THN CM Short Term Goal #5 Start Date  08/09/16  Interventions for Short Term Goal #5  RN sent educational material on plate serving and portion control. RN will follow up with disucssion and teach back      Assessment:  Patient blood sugar ranges in the 120"s highest 130 Patient is watching diet Patient fell no injuries Patient had flu shot Patient had eye exam Patient A1C 5.2  Plan:  RN discussed fall prevention RN discussed signs and symptoms of hypo and hyperglycemia RN discussed serving sizes and plate portion. RN will follow up within the month of January.   .fpl

## 2016-08-12 ENCOUNTER — Encounter: Payer: Self-pay | Admitting: Podiatry

## 2016-08-12 ENCOUNTER — Ambulatory Visit (INDEPENDENT_AMBULATORY_CARE_PROVIDER_SITE_OTHER): Payer: Medicare Other | Admitting: Podiatry

## 2016-08-12 VITALS — Ht 61.0 in | Wt 162.0 lb

## 2016-08-12 DIAGNOSIS — E1140 Type 2 diabetes mellitus with diabetic neuropathy, unspecified: Secondary | ICD-10-CM | POA: Diagnosis not present

## 2016-08-12 DIAGNOSIS — B351 Tinea unguium: Secondary | ICD-10-CM

## 2016-08-12 DIAGNOSIS — Q828 Other specified congenital malformations of skin: Secondary | ICD-10-CM

## 2016-08-12 DIAGNOSIS — M79676 Pain in unspecified toe(s): Secondary | ICD-10-CM

## 2016-08-12 NOTE — Progress Notes (Signed)
Patient ID: Christine Gay, female   DOB: [birth year], 75 y.o.   MRN: [id number] Complaint:  Visit Type: Patient returns to my office for continued preventative foot care services. Complaint: Patient states" my nails have grown long and thick and become painful to walk and wear shoes" Patient has been diagnosed with DM with no foot complications. The patient presents for preventative foot care services. No changes to ROS  Podiatric Exam: Vascular: dorsalis pedis and posterior tibial pulses are palpable bilateral. Capillary return is immediate. Temperature gradient is WNL. Skin turgor WNL  Sensorium: Normal Semmes Weinstein monofilament test. Normal tactile sensation bilaterally. Nail Exam: Pt has thick disfigured discolored nails with subungual debris noted bilateral entire nail hallux through fifth toenails Ulcer Exam: There is no evidence of ulcer or pre-ulcerative changes or infection. Orthopedic Exam: Muscle tone and strength are WNL. No limitations in general ROM. No crepitus or effusions noted. Foot type and digits show no abnormalities. Bony prominences are unremarkable. HAV 1st  MPJ B/L.  Hammer toes 2 B/l Skin: No Porokeratosis. No infection or ulcers.  Callus sub 5th B/L  Diagnosis:  Onychomycosis, , Pain in right toe, pain in left toes, Debride porokeratosis B/l  Treatment & Plan Procedures and Treatment: Consent by patient was obtained for treatment procedures. The patient understood the discussion of treatment and procedures well. All questions were answered thoroughly reviewed. Debridement of mycotic and hypertrophic toenails, 1 through 5 bilateral and clearing of subungual debris. No ulceration, no infection noted. . Return Visit-Office Procedure: Patient instructed to return to the office for a follow up visit 3 months for continued evaluation and treatment.  Gardiner Barefoot DPM

## 2016-09-18 ENCOUNTER — Other Ambulatory Visit: Payer: Self-pay | Admitting: Cardiology

## 2016-09-28 ENCOUNTER — Encounter (HOSPITAL_COMMUNITY)
Admission: EM | Disposition: A | Discharge status: Home Health | Payer: Self-pay | Source: Home / Self Care | Attending: Cardiology

## 2016-09-28 ENCOUNTER — Encounter (HOSPITAL_COMMUNITY): Payer: Self-pay

## 2016-09-28 ENCOUNTER — Emergency Department (HOSPITAL_COMMUNITY): Payer: Medicare Other

## 2016-09-28 ENCOUNTER — Inpatient Hospital Stay (HOSPITAL_COMMUNITY)
Admission: EM | Admit: 2016-09-28 | Discharge: 2016-09-30 | DRG: 243 | Disposition: A | Discharge status: Home Health | Payer: Medicare Other | Attending: Cardiology | Admitting: Cardiology

## 2016-09-28 DIAGNOSIS — I200 Unstable angina: Secondary | ICD-10-CM | POA: Diagnosis not present

## 2016-09-28 DIAGNOSIS — I442 Atrioventricular block, complete: Secondary | ICD-10-CM | POA: Diagnosis not present

## 2016-09-28 DIAGNOSIS — H409 Unspecified glaucoma: Secondary | ICD-10-CM | POA: Diagnosis present

## 2016-09-28 DIAGNOSIS — Z951 Presence of aortocoronary bypass graft: Secondary | ICD-10-CM

## 2016-09-28 DIAGNOSIS — E7800 Pure hypercholesterolemia, unspecified: Secondary | ICD-10-CM | POA: Diagnosis present

## 2016-09-28 DIAGNOSIS — E119 Type 2 diabetes mellitus without complications: Secondary | ICD-10-CM | POA: Diagnosis present

## 2016-09-28 DIAGNOSIS — I255 Ischemic cardiomyopathy: Secondary | ICD-10-CM | POA: Diagnosis not present

## 2016-09-28 DIAGNOSIS — Z961 Presence of intraocular lens: Secondary | ICD-10-CM | POA: Diagnosis present

## 2016-09-28 DIAGNOSIS — R531 Weakness: Secondary | ICD-10-CM | POA: Diagnosis not present

## 2016-09-28 DIAGNOSIS — Z9842 Cataract extraction status, left eye: Secondary | ICD-10-CM | POA: Diagnosis not present

## 2016-09-28 DIAGNOSIS — I248 Other forms of acute ischemic heart disease: Secondary | ICD-10-CM | POA: Diagnosis present

## 2016-09-28 DIAGNOSIS — D573 Sickle-cell trait: Secondary | ICD-10-CM | POA: Diagnosis present

## 2016-09-28 DIAGNOSIS — I252 Old myocardial infarction: Secondary | ICD-10-CM

## 2016-09-28 DIAGNOSIS — Z95818 Presence of other cardiac implants and grafts: Secondary | ICD-10-CM

## 2016-09-28 DIAGNOSIS — I110 Hypertensive heart disease with heart failure: Secondary | ICD-10-CM | POA: Diagnosis present

## 2016-09-28 DIAGNOSIS — E039 Hypothyroidism, unspecified: Secondary | ICD-10-CM | POA: Diagnosis present

## 2016-09-28 DIAGNOSIS — I429 Cardiomyopathy, unspecified: Secondary | ICD-10-CM | POA: Diagnosis not present

## 2016-09-28 DIAGNOSIS — I452 Bifascicular block: Secondary | ICD-10-CM | POA: Diagnosis present

## 2016-09-28 DIAGNOSIS — Z9841 Cataract extraction status, right eye: Secondary | ICD-10-CM | POA: Diagnosis not present

## 2016-09-28 DIAGNOSIS — M1990 Unspecified osteoarthritis, unspecified site: Secondary | ICD-10-CM | POA: Diagnosis present

## 2016-09-28 DIAGNOSIS — I5032 Chronic diastolic (congestive) heart failure: Secondary | ICD-10-CM | POA: Diagnosis present

## 2016-09-28 DIAGNOSIS — I2510 Atherosclerotic heart disease of native coronary artery without angina pectoris: Secondary | ICD-10-CM | POA: Diagnosis not present

## 2016-09-28 DIAGNOSIS — Z955 Presence of coronary angioplasty implant and graft: Secondary | ICD-10-CM | POA: Diagnosis not present

## 2016-09-28 DIAGNOSIS — K219 Gastro-esophageal reflux disease without esophagitis: Secondary | ICD-10-CM | POA: Diagnosis present

## 2016-09-28 DIAGNOSIS — Z7982 Long term (current) use of aspirin: Secondary | ICD-10-CM

## 2016-09-28 DIAGNOSIS — Z79899 Other long term (current) drug therapy: Secondary | ICD-10-CM | POA: Diagnosis not present

## 2016-09-28 DIAGNOSIS — R079 Chest pain, unspecified: Secondary | ICD-10-CM | POA: Diagnosis not present

## 2016-09-28 HISTORY — PX: CARDIAC CATHETERIZATION: SHX172

## 2016-09-28 LAB — BASIC METABOLIC PANEL
ANION GAP: 8 (ref 5–15)
ANION GAP: 11 (ref 5–15)
BUN: 9 mg/dL (ref 6–20)
BUN: 9 mg/dL (ref 6–20)
CALCIUM: 8.3 mg/dL — AB (ref 8.9–10.3)
CHLORIDE: 107 mmol/L (ref 101–111)
CO2: 25 mmol/L (ref 22–32)
CO2: 22 mmol/L (ref 22–32)
Calcium: 8.3 mg/dL — ABNORMAL LOW (ref 8.9–10.3)
Chloride: 106 mmol/L (ref 101–111)
Creatinine, Ser: 1.3 mg/dL — ABNORMAL HIGH (ref 0.44–1.00)
Creatinine, Ser: 1.02 mg/dL — ABNORMAL HIGH (ref 0.44–1.00)
GFR calc non Af Amer: 39 mL/min — ABNORMAL LOW (ref >60)
GFR calc non Af Amer: 52 mL/min — ABNORMAL LOW (ref >60)
GFR, EST AFRICAN AMERICAN: 45 mL/min — AB (ref >60)
GLUCOSE: 124 mg/dL — AB (ref 65–99)
Glucose, Bld: 109 mg/dL — ABNORMAL HIGH (ref 65–99)
POTASSIUM: 3.9 mmol/L (ref 3.5–5.1)
Potassium: 4 mmol/L (ref 3.5–5.1)
SODIUM: 139 mmol/L (ref 135–145)
Sodium: 140 mmol/L (ref 135–145)

## 2016-09-28 LAB — CBC
HCT: 37.6 % (ref 36.0–46.0)
HEMATOCRIT: 35.1 % — AB (ref 36.0–46.0)
HEMOGLOBIN: 12.4 g/dL (ref 12.0–15.0)
HEMOGLOBIN: 12.1 g/dL (ref 12.0–15.0)
MCH: 32.1 pg (ref 26.0–34.0)
MCH: 32.9 pg (ref 26.0–34.0)
MCHC: 33 g/dL (ref 30.0–36.0)
MCHC: 34.5 g/dL (ref 30.0–36.0)
MCV: 97.4 fL (ref 78.0–100.0)
MCV: 95.4 fL (ref 78.0–100.0)
PLATELETS: 190 10*3/uL (ref 150–400)
Platelets: 183 10*3/uL (ref 150–400)
RBC: 3.86 MIL/uL — AB (ref 3.87–5.11)
RBC: 3.68 MIL/uL — AB (ref 3.87–5.11)
RDW: 12.6 % (ref 11.5–15.5)
RDW: 12.3 % (ref 11.5–15.5)
WBC: 7.1 10*3/uL (ref 4.0–10.5)
WBC: 7 10*3/uL (ref 4.0–10.5)

## 2016-09-28 LAB — MAGNESIUM: MAGNESIUM: 2 mg/dL (ref 1.7–2.4)

## 2016-09-28 LAB — BRAIN NATRIURETIC PEPTIDE: B NATRIURETIC PEPTIDE 5: 380.3 pg/mL — AB (ref 0.0–100.0)

## 2016-09-28 LAB — TROPONIN I
TROPONIN I: 0.09 ng/mL — AB (ref <0.03)
TROPONIN I: 2.81 ng/mL — AB (ref <0.03)

## 2016-09-28 LAB — POCT ACTIVATED CLOTTING TIME: Activated Clotting Time: 153 seconds

## 2016-09-28 LAB — I-STAT TROPONIN, ED: TROPONIN I, POC: 0 ng/mL (ref 0.00–0.08)

## 2016-09-28 LAB — PROTIME-INR
INR: 1.16
Prothrombin Time: 14.9 seconds (ref 11.4–15.2)

## 2016-09-28 LAB — TSH: TSH: 2.202 u[IU]/mL (ref 0.350–4.500)

## 2016-09-28 SURGERY — LEFT HEART CATH AND CORONARY ANGIOGRAPHY

## 2016-09-28 MED ORDER — ISOSORBIDE MONONITRATE ER 60 MG PO TB24
60.0000 mg | ORAL_TABLET | Freq: Two times a day (BID) | ORAL | Status: DC
  Administered 2016-09-28 – 2016-09-30 (×3): 60 mg via ORAL
  Filled 2016-09-28 (×4): qty 1

## 2016-09-28 MED ORDER — IOPAMIDOL (ISOVUE-370) INJECTION 76%
INTRAVENOUS | Status: AC
  Filled 2016-09-28: qty 125

## 2016-09-28 MED ORDER — LIDOCAINE HCL (PF) 1 % IJ SOLN
INTRAMUSCULAR | Status: AC
  Filled 2016-09-28: qty 30

## 2016-09-28 MED ORDER — PHENYTOIN SODIUM EXTENDED 100 MG PO CAPS
200.0000 mg | ORAL_CAPSULE | Freq: Every day | ORAL | Status: DC
  Administered 2016-09-29 – 2016-09-30 (×2): 200 mg via ORAL
  Filled 2016-09-28 (×2): qty 2

## 2016-09-28 MED ORDER — ASPIRIN 81 MG PO CHEW
243.0000 mg | CHEWABLE_TABLET | Freq: Once | ORAL | Status: AC
  Administered 2016-09-28: 243 mg via ORAL

## 2016-09-28 MED ORDER — HEPARIN (PORCINE) IN NACL 2-0.9 UNIT/ML-% IJ SOLN
INTRAMUSCULAR | Status: DC | PRN
  Administered 2016-09-28: 19:00:00

## 2016-09-28 MED ORDER — FENTANYL CITRATE (PF) 100 MCG/2ML IJ SOLN
INTRAMUSCULAR | Status: AC
  Filled 2016-09-28: qty 2

## 2016-09-28 MED ORDER — LIDOCAINE HCL (PF) 1 % IJ SOLN
INTRAMUSCULAR | Status: DC | PRN
  Administered 2016-09-28: 15 mL

## 2016-09-28 MED ORDER — POTASSIUM CHLORIDE CRYS ER 10 MEQ PO TBCR
10.0000 meq | EXTENDED_RELEASE_TABLET | Freq: Every day | ORAL | Status: DC
  Administered 2016-09-28 – 2016-09-29 (×2): 10 meq via ORAL
  Filled 2016-09-28 (×2): qty 1

## 2016-09-28 MED ORDER — SODIUM CHLORIDE 0.9% FLUSH
3.0000 mL | INTRAVENOUS | Status: DC | PRN
Start: 2016-09-28 — End: 2016-09-29

## 2016-09-28 MED ORDER — SODIUM CHLORIDE 0.9 % IV SOLN: 250.0000 mL | INTRAVENOUS | Status: DC | PRN

## 2016-09-28 MED ORDER — SODIUM CHLORIDE 0.9% FLUSH
3.0000 mL | Freq: Two times a day (BID) | INTRAVENOUS | Status: DC
  Administered 2016-09-29 – 2016-09-30 (×3): 3 mL via INTRAVENOUS

## 2016-09-28 MED ORDER — ASPIRIN EC 81 MG PO TBEC
81.0000 mg | DELAYED_RELEASE_TABLET | Freq: Every day | ORAL | Status: DC
  Administered 2016-09-29 – 2016-09-30 (×2): 81 mg via ORAL
  Filled 2016-09-28 (×2): qty 1

## 2016-09-28 MED ORDER — ASPIRIN 81 MG PO CHEW
324.0000 mg | CHEWABLE_TABLET | Freq: Once | ORAL | Status: DC
  Filled 2016-09-28: qty 4

## 2016-09-28 MED ORDER — PHENYTOIN SODIUM EXTENDED 100 MG PO CAPS: 100.0000 mg | ORAL_CAPSULE | Freq: Two times a day (BID) | ORAL | Status: DC

## 2016-09-28 MED ORDER — SODIUM CHLORIDE 0.9% FLUSH
3.0000 mL | Freq: Two times a day (BID) | INTRAVENOUS | Status: DC
  Administered 2016-09-28 – 2016-09-29 (×2): 3 mL via INTRAVENOUS

## 2016-09-28 MED ORDER — ONDANSETRON HCL 4 MG/2ML IJ SOLN: 4.0000 mg | Freq: Four times a day (QID) | INTRAMUSCULAR | Status: DC | PRN

## 2016-09-28 MED ORDER — FENTANYL CITRATE (PF) 100 MCG/2ML IJ SOLN
INTRAMUSCULAR | Status: DC | PRN
  Administered 2016-09-28: 25 ug via INTRAVENOUS

## 2016-09-28 MED ORDER — SODIUM CHLORIDE 0.9% FLUSH: 3.0000 mL | INTRAVENOUS | Status: DC | PRN

## 2016-09-28 MED ORDER — NITROGLYCERIN IN D5W 200-5 MCG/ML-% IV SOLN
5.0000 ug/min | INTRAVENOUS | Status: DC
  Administered 2016-09-28: 5 ug/min via INTRAVENOUS
  Filled 2016-09-28: qty 250

## 2016-09-28 MED ORDER — TIMOLOL MALEATE 0.5 % OP SOLN
1.0000 [drp] | Freq: Every day | OPHTHALMIC | Status: DC
  Administered 2016-09-29: 1 [drp] via OPHTHALMIC
  Filled 2016-09-28: qty 5

## 2016-09-28 MED ORDER — SODIUM CHLORIDE 0.9% FLUSH
3.0000 mL | Freq: Two times a day (BID) | INTRAVENOUS | Status: DC
  Administered 2016-09-29: 3 mL via INTRAVENOUS

## 2016-09-28 MED ORDER — IOPAMIDOL (ISOVUE-370) INJECTION 76%
INTRAVENOUS | Status: DC | PRN
  Administered 2016-09-28: 85 mL

## 2016-09-28 MED ORDER — HEPARIN SODIUM (PORCINE) 5000 UNIT/ML IJ SOLN
5000.0000 [IU] | Freq: Three times a day (TID) | INTRAMUSCULAR | Status: DC
  Administered 2016-09-29: 5000 [IU] via SUBCUTANEOUS
  Filled 2016-09-28: qty 1

## 2016-09-28 MED ORDER — CYCLOSPORINE 0.05 % OP EMUL
1.0000 [drp] | Freq: Two times a day (BID) | OPHTHALMIC | Status: DC
  Administered 2016-09-28 – 2016-09-30 (×4): 1 [drp] via OPHTHALMIC
  Filled 2016-09-28 (×4): qty 1

## 2016-09-28 MED ORDER — ROSUVASTATIN CALCIUM 10 MG PO TABS
40.0000 mg | ORAL_TABLET | Freq: Every day | ORAL | Status: DC
  Administered 2016-09-29: 40 mg via ORAL
  Filled 2016-09-28: qty 4

## 2016-09-28 MED ORDER — POTASSIUM CHLORIDE CRYS ER 20 MEQ PO TBCR
20.0000 meq | EXTENDED_RELEASE_TABLET | Freq: Every day | ORAL | Status: DC
  Administered 2016-09-29 – 2016-09-30 (×2): 20 meq via ORAL
  Filled 2016-09-28 (×2): qty 1

## 2016-09-28 MED ORDER — ACETAMINOPHEN 325 MG PO TABS: 650.0000 mg | ORAL_TABLET | ORAL | Status: DC | PRN

## 2016-09-28 MED ORDER — NITROGLYCERIN 0.4 MG SL SUBL
0.4000 mg | SUBLINGUAL_TABLET | Freq: Once | SUBLINGUAL | Status: AC
  Administered 2016-09-28: 0.4 mg via SUBLINGUAL

## 2016-09-28 MED ORDER — AMIODARONE HCL IN DEXTROSE 360-4.14 MG/200ML-% IV SOLN
INTRAVENOUS | Status: AC
  Filled 2016-09-28: qty 200

## 2016-09-28 MED ORDER — HEPARIN (PORCINE) IN NACL 2-0.9 UNIT/ML-% IJ SOLN
INTRAMUSCULAR | Status: AC
  Filled 2016-09-28: qty 1500

## 2016-09-28 MED ORDER — SODIUM CHLORIDE 0.9 % IV SOLN
250.0000 mL | INTRAVENOUS | Status: DC | PRN
Start: 2016-09-28 — End: 2016-09-29

## 2016-09-28 MED ORDER — LEVOTHYROXINE SODIUM 100 MCG PO TABS
100.0000 ug | ORAL_TABLET | Freq: Every day | ORAL | Status: DC
  Administered 2016-09-29 – 2016-09-30 (×2): 100 ug via ORAL
  Filled 2016-09-28 (×2): qty 1

## 2016-09-28 MED ORDER — HEPARIN (PORCINE) IN NACL 100-0.45 UNIT/ML-% IJ SOLN
850.0000 [IU]/h | INTRAMUSCULAR | Status: DC
  Administered 2016-09-28: 850 [IU]/h via INTRAVENOUS
  Filled 2016-09-28: qty 250

## 2016-09-28 MED ORDER — PANTOPRAZOLE SODIUM 40 MG PO TBEC
40.0000 mg | DELAYED_RELEASE_TABLET | Freq: Every day | ORAL | Status: DC
  Administered 2016-09-29 – 2016-09-30 (×2): 40 mg via ORAL
  Filled 2016-09-28 (×2): qty 1

## 2016-09-28 MED ORDER — FENTANYL CITRATE (PF) 100 MCG/2ML IJ SOLN
25.0000 ug | Freq: Once | INTRAMUSCULAR | Status: AC
  Administered 2016-09-28: 25 ug via INTRAVENOUS

## 2016-09-28 MED ORDER — NITROGLYCERIN 0.4 MG SL SUBL
0.4000 mg | SUBLINGUAL_TABLET | SUBLINGUAL | Status: DC | PRN
  Filled 2016-09-28: qty 1

## 2016-09-28 MED ORDER — HEPARIN BOLUS VIA INFUSION
3000.0000 [IU] | Freq: Once | INTRAVENOUS | Status: AC
  Administered 2016-09-28: 3000 [IU] via INTRAVENOUS
  Filled 2016-09-28: qty 3000

## 2016-09-28 MED ORDER — MIDAZOLAM HCL 2 MG/2ML IJ SOLN
INTRAMUSCULAR | Status: DC | PRN
  Administered 2016-09-28: 2 mg via INTRAVENOUS

## 2016-09-28 MED ORDER — FUROSEMIDE 40 MG PO TABS
40.0000 mg | ORAL_TABLET | Freq: Two times a day (BID) | ORAL | Status: DC
  Administered 2016-09-28 – 2016-09-30 (×4): 40 mg via ORAL
  Filled 2016-09-28 (×4): qty 1

## 2016-09-28 MED ORDER — EZETIMIBE 10 MG PO TABS
10.0000 mg | ORAL_TABLET | Freq: Every day | ORAL | Status: DC
  Administered 2016-09-29 – 2016-09-30 (×2): 10 mg via ORAL
  Filled 2016-09-28 (×2): qty 1

## 2016-09-28 MED ORDER — SODIUM CHLORIDE 0.9 % WEIGHT BASED INFUSION
1.0000 mL/kg/h | INTRAVENOUS | Status: AC
  Administered 2016-09-28: 1 mL/kg/h via INTRAVENOUS

## 2016-09-28 MED ORDER — MIDAZOLAM HCL 2 MG/2ML IJ SOLN
INTRAMUSCULAR | Status: AC
  Filled 2016-09-28: qty 2

## 2016-09-28 MED ORDER — SODIUM CHLORIDE 0.9 % IV SOLN
INTRAVENOUS | Status: DC
  Administered 2016-09-28: via INTRAVENOUS

## 2016-09-28 MED ORDER — ASPIRIN 81 MG PO CHEW: 81.0000 mg | CHEWABLE_TABLET | ORAL | Status: AC

## 2016-09-28 MED ORDER — FERROUS SULFATE 325 (65 FE) MG PO TABS
325.0000 mg | ORAL_TABLET | Freq: Every day | ORAL | Status: DC
  Administered 2016-09-29 – 2016-09-30 (×2): 325 mg via ORAL
  Filled 2016-09-28 (×2): qty 1

## 2016-09-28 MED ORDER — PHENYTOIN SODIUM EXTENDED 100 MG PO CAPS
100.0000 mg | ORAL_CAPSULE | Freq: Every day | ORAL | Status: DC
  Administered 2016-09-28 – 2016-09-29 (×2): 100 mg via ORAL
  Filled 2016-09-28 (×2): qty 1

## 2016-09-28 SURGICAL SUPPLY — 14 items
CATH EXPO 5F IM (CATHETERS) ×2 IMPLANT
CATH INFINITI 5FR MULTPACK ANG (CATHETERS) ×2 IMPLANT
GLIDESHEATH SLEND SS 6F .021 (SHEATH) IMPLANT
GUIDEWIRE INQWIRE 1.5J.035X260 (WIRE) IMPLANT
INQWIRE 1.5J .035X260CM (WIRE)
KIT HEART LEFT (KITS) ×3 IMPLANT
PACK CARDIAC CATHETERIZATION (CUSTOM PROCEDURE TRAY) ×3 IMPLANT
SHEATH PINNACLE 5F 10CM (SHEATH) ×2 IMPLANT
SYR MEDRAD MARK V 150ML (SYRINGE) ×3 IMPLANT
TRANSDUCER W/STOPCOCK (MISCELLANEOUS) ×3 IMPLANT
TUBING CIL FLEX 10 FLL-RA (TUBING) ×3 IMPLANT
WIRE EMERALD 3MM-J .035X150CM (WIRE) ×2 IMPLANT
WIRE EMERALD 3MM-J .035X260CM (WIRE) ×2 IMPLANT
WIRE HI TORQ VERSACORE-J 145CM (WIRE) ×2 IMPLANT

## 2016-09-28 NOTE — Progress Notes (Signed)
ANTICOAGULATION CONSULT NOTE - Initial Consult  Pharmacy Consult for heparin Indication: chest pain/ACS  Allergies  Allergen Reactions  . Meperidine Hcl Other (See Comments)    Demerol - mental status changes    . Nitroglycerin Other (See Comments)    Patch and Cream only - blisters  . Morphine Palpitations    Patient Measurements: Height: 5' (152.4 cm) Weight: 170 lb (77.1 kg) IBW/kg (Calculated) : 45.5 Heparin Dosing Weight: 63kg  Vital Signs: Temp: 97.5 F (36.4 C) (01/02 1250) Temp Source: Oral (01/02 1250) BP: 109/48 (01/02 1600) Pulse Rate: 41 (01/02 1600)  Labs:  Recent Labs  09/28/16 1256 09/28/16 1531  HGB 12.4  --   HCT 37.6  --   PLT 190  --   CREATININE 1.30*  --   TROPONINI  --  0.09*    Estimated Creatinine Clearance: 34.3 mL/min (by C-G formula based on SCr of 1.3 mg/dL (H)).  Assessment: 75 YOF in the ED with chest pain. She has history of CHF, CAD/CABG and PCI. She is not on anticoagulation PTA.  Baseline Hgb and platelets in normal limits. Noted plans for cath tomorrow.  Goal of Therapy:  Heparin level 0.3-0.7 units/ml Monitor platelets by anticoagulation protocol: Yes   Plan:  -heparin 3000 units IV x1, then start infusion at 850units/hr -heparin level in 6h -daily heparin level and CBC -follow cardiology plans  Atia Haupt D. Karter Hellmer, PharmD, BCPS Clinical Pharmacist Pager: [phone] 09/28/2016 4:43 PM

## 2016-09-28 NOTE — Consult Note (Signed)
ELECTROPHYSIOLOGY CONSULT NOTE    Patient ID: Christine Gay MRN: [id number], DOB/AGE: 03/13/1941 76 y.o.  Admit date: 09/28/2016 Date of Consult: 09/28/2016   Primary Physician: Kandice Hams, MD Primary Cardiologist: Dr. Aundra Dubin  Reason for Consultation: CHB  HPI: Christine Gay is a 76 y.o. female  with PMHx of diastolic CHF, CAD/CABG, Dr. Oleh Genin did cardaic catheterization in 8/14 that showed patent LIMA-LAD, occluded SVG-OM1, and 95% ostial LCx stenosis.  She had PCI to ostial LCx with Promus DES.  Cardiolite in 5/15 showed no ischemia or infarction. Most recently a lexiscan Cardiolite in 2/16 showed a small, mid anterior partially reversible defect that may have been due to soft tissue attenuation (low risk) and echo showed EF 55-60%.  She had an upper GI bleed in 9/15, she is now off Plavix, HTN, DM, LBBB, hypothyroid.  She comes to the ER with c/o CP onset at rest, resolved with 2 s/l NTG, though recurred again 8/10 though self limited however continues to have waxing/waning non-radiating CP in the ED at rest.  She reports generally having no energy for "a while", no near syncope or syncope.  She does say that she has been falling.  She is certain she had never fainted, never has had LOC, or weakness, not tripping, just collapses to the floor, luckily no injuries.  LABS: K+ 4.0 BUN/Creat 9/1.30 poc Trop 0.00 H/H 12.4/37.6 WBC 7.1 Plts 109  Last TSH on 04/09/15 was 2.317  Home meds include Coreg 3.125mg  BID, last dose was this morning uncertain time.   Past Medical History:  Diagnosis Date  . Anemia    a. Noted on labs 04/2013.  . Anginal pain ([place])   . Arthritis    "legs" (06/10/2014)  . CAD (coronary artery disease)    a. s/p CABG 1992, b. s/p PCI several years ago; c. LHC 11/08: L-LAD ok, S-OM1 ok, S-OM2 CTO, prox to mid CFX 70% (unchanged), RCA non-dominant, occluded;  d.  Echo 2/11: EF 55%, mod LVH, Ao sclerosis;  e. Echo 6/14: mild LVH, mild focal basal sept  hypertrophy, EF 55-65%, inf HK, Mild AI, mild MR, mild BAE;  f. Myoview 7/14: int risk-> cath 04/2013 s/p PTCA/DES to prox Cx 05/04/13.  . Chronic diastolic CHF (congestive heart failure) ([place])   . GERD (gastroesophageal reflux disease)   . Glaucoma   . High cholesterol   . Hypertension   . Hypothyroidism   . LBBB (left bundle branch block)    chronic  . Myocardial infarction 03/1991 X 2; 07/1991  . Obesity   . Seizures ([place])    "I'm on Dilantin" (06/10/2014)  . Sickle cell trait ([place])   . Type II diabetes mellitus ([place])      Surgical History:  Past Surgical History:  Procedure Laterality Date  . CARDIAC CATHETERIZATION     "several;  they went up to check"  . CATARACT EXTRACTION W/ INTRAOCULAR LENS  IMPLANT, BILATERAL Bilateral   . CHOLECYSTECTOMY    . COLONOSCOPY N/A 06/12/2014   Procedure: COLONOSCOPY;  Surgeon: Cleotis Nipper, MD;  Location: [facility] ENDOSCOPY;  Service: Endoscopy;  Laterality: N/A;  . CORONARY ANGIOPLASTY WITH STENT PLACEMENT  ? date; 05/04/2013   ? location; DES  to [place]  . CORONARY ARTERY BYPASS GRAFT  07/1991   "CABG X 3"  . ESOPHAGOGASTRODUODENOSCOPY N/A 06/12/2014   Procedure: ESOPHAGOGASTRODUODENOSCOPY (EGD);  Surgeon: Cleotis Nipper, MD;  Location: [facility] ENDOSCOPY;  Service: Endoscopy;  Laterality: N/A;  . LEFT AND RIGHT HEART  CATHETERIZATION WITH CORONARY ANGIOGRAM N/A 05/04/2013   Procedure: LEFT AND RIGHT HEART CATHETERIZATION WITH CORONARY ANGIOGRAM;  Surgeon: Larey Dresser, MD;  Location: [facility] CATH LAB;  Service: Cardiovascular;  Laterality: N/A;  . PERCUTANEOUS STENT INTERVENTION  05/04/2013   Procedure: PERCUTANEOUS STENT INTERVENTION;  Surgeon: Larey Dresser, MD;  Location: [facility] CATH LAB;  Service: Cardiovascular;;  . TUBAL LIGATION Bilateral 1969      (Not in a hospital admission)  Inpatient Medications:   Allergies:  Allergies  Allergen Reactions  . Meperidine Hcl Other (See Comments)    Demerol - mental status changes    . Nitroglycerin  Other (See Comments)    Patch and Cream only - blisters  . Morphine Palpitations    Social History   Social History  . Marital status: Married    Spouse name: N/A  . Number of children: N/A  . Years of education: N/A   Occupational History  . Not on file.   Social History Main Topics  . Smoking status: Never Smoker  . Smokeless tobacco: Never Used  . Alcohol use No  . Drug use: No  . Sexual activity: No   Other Topics Concern  . Not on file   Social History Narrative  . No narrative on file     Family History  Problem Relation Age of Onset  . Diabetes Mother      Review of Systems: All other systems reviewed and are otherwise negative except as noted above.  Physical Exam: Vitals:   09/28/16 1250 09/28/16 1315 09/28/16 1330  BP: 149/63 117/61 126/58  Pulse: (!) 57 (!) 51 (!) 43  Resp: 17 17 16   Temp: 97.5 F (36.4 C)    TempSrc: Oral    SpO2: 100% 99% 99%  Weight: 170 lb (77.1 kg)    Height: 5' (1.524 m)      GEN- The patient is a stable appearing, alert and oriented x 3 today.   HEENT: normocephalic, atraumatic; sclera clear, conjunctiva pink; hearing intact; oropharynx clear; neck supple, no JVP Lymph- no cervical lymphadenopathy Lungs- Clear to ausculation bilaterally, normal work of breathing.  No wheezes, rales, rhonchi Heart- Regular rate and rhythm, bradycardic, no murmurs, rubs or gallops, PMI not laterally displaced GI- soft, non-tender, non-distended Extremities- no clubbing, cyanosis, trace edema MS- no significant deformity or atrophy Skin- warm and dry, no rash or lesion Psych- euthymic mood, full affect Neuro- no gross deficits observed  Labs:   Lab Results  Component Value Date   WBC 7.1 09/28/2016   HGB 12.4 09/28/2016   HCT 37.6 09/28/2016   MCV 97.4 09/28/2016   PLT 190 09/28/2016    Recent Labs Lab 09/28/16 1256  NA 139  K 4.0  CL 106  CO2 25  BUN 9  CREATININE 1.30*  CALCIUM 8.3*  GLUCOSE 109*        Radiology/Studies:  Dg Chest Port 1 View Result Date: 09/28/2016 CLINICAL DATA:  Chest pain for 1 day EXAM: PORTABLE CHEST 1 VIEW COMPARISON:  April 09, 2015 FINDINGS: There is no edema or consolidation. Heart is mildly enlarged with pulmonary vascularity within normal limits. No adenopathy. Patient is status post median sternotomy. No bone lesions. IMPRESSION: Stable cardiomegaly.  No edema or consolidation. Electronically Signed   By: Lowella Grip III M.D.   On: 09/28/2016 13:45    EKG: CHB RBBB, 42bpm, QRS 132bpm 05/18/16: SB, 51bpm, LBBB TELEMETRY: CHB 40's  11/20/14: TTE Study Conclusions - Left ventricle: The cavity size was  normal. Wall thickness was normal. Systolic function was normal. The estimated ejection fraction was in the range of 55% to 60%. Wall motion was normal; there were no regional wall motion abnormalities. Doppler parameters are consistent with abnormal left ventricular relaxation (grade 1 diastolic dysfunction). Indeterminate mean left atrial pressure. - Aortic valve: There was trivial regurgitation. - Left atrium: The atrium was mildly dilated. - Pulmonary arteries: PA peak pressure: 32 mm Hg (S).    Assessment and Plan:  1. CP     Hx of CAD, last testing as above     Intermittent CP continues     C/w primary cardiology team, starting NTG gtt and pain management     Planned for cath tomorrow  2. CHB with RBBB rates 40's     Baseline is LBBB     Hold coreg, follow for possible pacer tomorrow pending response off BB and cath findings     BP is stable     Check TSH  3. HTN     BP ok  4.  Falls      Unclear mechanism by her description is positive she is not fainting   5. CHF     + DOE lately, CXR clear     Echo this admit is pending   Venetia Night, PA-C 09/28/2016 2:34 PM  EP Attending  Patient seen and examined. Agree with the findings as noted above. The patient presents with complete heart block and chest pain. Her  EKG is concerning with deep T-wave inversions that the initial cardiac enzymes have been un-remarkable. She has chronic left bundle branch block, and is on low dose beta blocker therapy. Her exam demonstrates a 76 year old woman who is not acutely ill and in no acute respiratory distress. Vitals are stable except for a heart rate of 40. Respirations were 18 cardiovascular exam reveals a regular bradycardia with normal S1 and S2. I did not appreciate a murmur. Abdominal exam was soft nontender there is no organomegaly the extremities demonstrated no edema. Telemetry demonstrates sinus rhythm with complete heart block. EKG demonstrates sinus rhythm with complete heart block. Chest x-ray has been reviewed.  Assessment and plan 1. Complete heart block. The etiology is unclear. Hopefully her conduction was improved either with discontinuation of her beta blocker, or restoration of blood flow. If not on the above improved her heart rate, insertion of a permanent dual chamber pacemaker will be recommended. 2. Chest pain - her initial enzymes were negative, but her EKG is a bit concerning with deep ST-T wave changes anteriorly. She will undergo cardiac catheterization. Perhaps this will be a reversible cause her heart block. 3. Diastolic heart failure - her current symptoms are well compensated. She will maintain a low-sodium diet. Additional recommendation as per Dr. Aundra Dubin. 4. Coronary artery disease - she has had anginal symptoms and neck discomfort. She will undergo catheterization. She is status post prior bypass surgery.  Cristopher Peru, M.D.

## 2016-09-28 NOTE — ED Provider Notes (Signed)
[facility] DEPT Provider Note   CSN: [id number] Arrival date & time: 09/28/16  1236     History   Chief Complaint Chief Complaint  Patient presents with  . Chest Pain    HPI Christine Gay is a 76 y.o. female.  HPI  76 year old female with past medical history of diabetes, hypertension, hyperlipidemia, coronary artery disease with known severe multivessel disease here with chest pain. Patient states that approximately 11 AM this morning, she was filling out, she developed acute onset of dull, aching, pressure-like chest pain. She had associated shortness of breath but no.nausea or diaphoresis. She took 1 dose of nitroglycerin which did not improve her symptoms so she took a second dose, which completely resolved this. Since then, however, her pain has returned and is now 8 out of 10 in severity. She has associated shortness of breath. No recent fevers or chills. She has been compliant with her medications.  Past Medical History:  Diagnosis Date  . Anemia    a. Noted on labs 04/2013.  . Anginal pain ([place])   . Arthritis    "legs" (06/10/2014)  . CAD (coronary artery disease)    a. s/p CABG 1992, b. s/p PCI several years ago; c. LHC 11/08: L-LAD ok, S-OM1 ok, S-OM2 CTO, prox to mid CFX 70% (unchanged), RCA non-dominant, occluded;  d.  Echo 2/11: EF 55%, mod LVH, Ao sclerosis;  e. Echo 6/14: mild LVH, mild focal basal sept hypertrophy, EF 55-65%, inf HK, Mild AI, mild MR, mild BAE;  f. Myoview 7/14: int risk-> cath 04/2013 s/p PTCA/DES to prox Cx 05/04/13.  . Chronic diastolic CHF (congestive heart failure) ([place])   . GERD (gastroesophageal reflux disease)   . Glaucoma   . High cholesterol   . Hypertension   . Hypothyroidism   . LBBB (left bundle branch block)    chronic  . Myocardial infarction 03/1991 X 2; 07/1991  . Obesity   . Seizures ([place])    "I'm on Dilantin" (06/10/2014)  . Sickle cell trait ([place])   . Type II diabetes mellitus [facility])     Patient Active Problem List   Diagnosis Date Noted  . CAD (coronary artery disease) 11/11/2014  . Dizziness 07/29/2014  . GI bleed 06/12/2014  . GERD (gastroesophageal reflux disease) 06/11/2014  . Type II or unspecified type diabetes mellitus without mention of complication, not stated as uncontrolled 06/11/2014  . Glaucoma 06/11/2014  . Microcytic hypochromic anemia 06/10/2014  . Chronic diastolic CHF (congestive heart failure) ([place]) 08/09/2013  . Hypotension 05/18/2013  . Exertional dyspnea 03/15/2013  . CHF (congestive heart failure) ([place]) 03/15/2013  . MURMUR 03/02/2010  . Unspecified hypothyroidism 03/20/2009  . HYPERCHOLESTEROLEMIA  IIA 03/20/2009  . HYPOKALEMIA 03/20/2009  . HYPERTENSION, BENIGN 03/20/2009  . CAD, ARTERY BYPASS GRAFT 03/20/2009    Past Surgical History:  Procedure Laterality Date  . CARDIAC CATHETERIZATION     "several;  they went up to check"  . CATARACT EXTRACTION W/ INTRAOCULAR LENS  IMPLANT, BILATERAL Bilateral   . CHOLECYSTECTOMY    . COLONOSCOPY N/A 06/12/2014   Procedure: COLONOSCOPY;  Surgeon: Cleotis Nipper, MD;  Location: [facility] ENDOSCOPY;  Service: Endoscopy;  Laterality: N/A;  . CORONARY ANGIOPLASTY WITH STENT PLACEMENT  ? date; 05/04/2013   ? location; DES  to [place]  . CORONARY ARTERY BYPASS GRAFT  07/1991   "CABG X 3"  . ESOPHAGOGASTRODUODENOSCOPY N/A 06/12/2014   Procedure: ESOPHAGOGASTRODUODENOSCOPY (EGD);  Surgeon: Cleotis Nipper, MD;  Location: [facility] ENDOSCOPY;  Service: Endoscopy;  Laterality: N/A;  . LEFT AND RIGHT HEART CATHETERIZATION WITH CORONARY ANGIOGRAM N/A 05/04/2013   Procedure: LEFT AND RIGHT HEART CATHETERIZATION WITH CORONARY ANGIOGRAM;  Surgeon: Larey Dresser, MD;  Location: [facility] CATH LAB;  Service: Cardiovascular;  Laterality: N/A;  . PERCUTANEOUS STENT INTERVENTION  05/04/2013   Procedure: PERCUTANEOUS STENT INTERVENTION;  Surgeon: Larey Dresser, MD;  Location: [facility] CATH LAB;  Service: Cardiovascular;;  . TUBAL LIGATION Bilateral 1969    OB History      No data available       Home Medications    Prior to Admission medications   Medication Sig Start Date End Date Taking? Authorizing Provider  aspirin 81 MG tablet Take 81 mg by mouth daily.    Historical Provider, MD  brimonidine-timolol (COMBIGAN) 0.2-0.5 % ophthalmic solution Place 1 drop into both eyes daily.     Historical Provider, MD  carvedilol (COREG) 6.25 MG tablet Take 0.5 tablets (3.125 mg total) by mouth 2 (two) times daily. 04/15/15   Larey Dresser, MD  ezetimibe (ZETIA) 10 MG tablet TAKE 1 TABLET BY MOUTH DAILY. 11/28/15   Larey Dresser, MD  ferrous sulfate 325 (65 FE) MG tablet Take 1 tablet (325 mg total) by mouth 2 (two) times daily with a meal. 06/12/14   Shanker Kristeen Mans, MD  fluorometholone (FML) 0.1 % ophthalmic suspension Place 1 drop into both eyes daily.  06/21/14   Historical Provider, MD  furosemide (LASIX) 40 MG tablet TAKE 1 TABLET BY MOUTH 2 TIMES DAILY. 07/12/16   Larey Dresser, MD  isosorbide mononitrate (IMDUR) 60 MG 24 hr tablet TAKE 2 TABLETS BY MOUTH 2 TIMES A DAY. 07/02/16   Larey Dresser, MD  isosorbide mononitrate (IMDUR) 60 MG 24 hr tablet TAKE 2 TABLETS BY MOUTH 2 TIMES A DAY. 09/21/16   Larey Dresser, MD  levothyroxine (SYNTHROID, LEVOTHROID) 100 MCG tablet Take 100 mcg by mouth daily before breakfast.    Historical Provider, MD  metFORMIN (GLUCOPHAGE-XR) 500 MG 24 hr tablet Take 500 mg by mouth 2 (two) times daily. 500 mg by mouth twice daily 05/08/13   Dayna N Dunn, PA-C  NITROSTAT 0.4 MG SL tablet PLACE 1 TABLET (0.4 MG TOTAL) UNDER THE TONGUE EVERY 5 (FIVE) MINUTES AS NEEDED FOR CHEST PAIN. 05/07/15   Larey Dresser, MD  pantoprazole (PROTONIX) 40 MG tablet TAKE 1 TABLET BY MOUTH DAILY. 06/01/16   Larey Dresser, MD  phenytoin (DILANTIN) 100 MG ER capsule Take 100 mg by mouth 3 (three) times daily.     Historical Provider, MD  potassium chloride SA (K-DUR,KLOR-CON) 20 MEQ tablet Take 1 tablet every morning and 1/2 tablet every evening. 05/19/16    Larey Dresser, MD  rosuvastatin (CRESTOR) 40 MG tablet TAKE 1 TABLET BY MOUTH DAILY. 04/12/16   Larey Dresser, MD    Family History Family History  Problem Relation Age of Onset  . Diabetes Mother     Social History Social History  Substance Use Topics  . Smoking status: Never Smoker  . Smokeless tobacco: Never Used  . Alcohol use No     Allergies   Meperidine hcl; Nitroglycerin; and Morphine   Review of Systems Review of Systems  Constitutional: Positive for fatigue. Negative for chills and fever.  HENT: Negative for congestion and rhinorrhea.   Eyes: Negative for visual disturbance.  Respiratory: Positive for chest tightness and shortness of breath. Negative for cough and wheezing.   Cardiovascular: Positive for chest pain and  palpitations. Negative for leg swelling.  Gastrointestinal: Negative for abdominal pain, diarrhea, nausea and vomiting.  Genitourinary: Negative for dysuria and flank pain.  Musculoskeletal: Negative for neck pain and neck stiffness.  Skin: Negative for rash and wound.  Allergic/Immunologic: Negative for immunocompromised state.  Neurological: Negative for syncope, weakness and headaches.  All other systems reviewed and are negative.    Physical Exam Updated Vital Signs BP 126/58   Pulse (!) 43   Temp 97.5 F (36.4 C) (Oral)   Resp 16   Ht 5' (1.524 m)   Wt 170 lb (77.1 kg)   SpO2 99%   BMI 33.20 kg/m   Physical Exam  Constitutional: She is oriented to person, place, and time. She appears well-developed and well-nourished. She appears distressed (Appears uncomfortable).  HENT:  Head: Normocephalic and atraumatic.  Eyes: Conjunctivae are normal.  Neck: Neck supple.  Cardiovascular: Normal rate, regular rhythm and normal heart sounds.  Exam reveals no friction rub.   No murmur heard. Pulmonary/Chest: Effort normal and breath sounds normal. No respiratory distress. She has no wheezes. She has no rales.  Abdominal: She exhibits  no distension.  Musculoskeletal: She exhibits no edema.  Neurological: She is alert and oriented to person, place, and time. She exhibits normal muscle tone.  Skin: Skin is warm. Capillary refill takes less than 2 seconds.  Psychiatric: She has a normal mood and affect.  Nursing note and vitals reviewed.    ED Treatments / Results  Labs (all labs ordered are listed, but only abnormal results are displayed) Labs Reviewed  CBC - Abnormal; Notable for the following:       Result Value   RBC 3.86 (*)    All other components within normal limits  BASIC METABOLIC PANEL  BRAIN NATRIURETIC PEPTIDE  MAGNESIUM  I-STAT TROPOININ, ED  I-STAT CHEM 8, ED    EKG  EKG Interpretation  Date/Time:  Tuesday September 28 2016 13:12:53 EST Ventricular Rate:  44 PR Interval:    QRS Duration: 142 QT Interval:  565 QTC Calculation: 484 R Axis:   -76 Text Interpretation:  Junctional rhythm Right bundle branch block Since last EKG, rhythm is now likely junctional Diffuse ST changes with concern for inferolateral ischemia Confirmed by Tiersa Dayley MD, Carlina Derks [phone]) on 09/28/2016 1:28:49 PM       Radiology Dg Chest Port 1 View  Result Date: 09/28/2016 CLINICAL DATA:  Chest pain for 1 day EXAM: PORTABLE CHEST 1 VIEW COMPARISON:  April 09, 2015 FINDINGS: There is no edema or consolidation. Heart is mildly enlarged with pulmonary vascularity within normal limits. No adenopathy. Patient is status post median sternotomy. No bone lesions. IMPRESSION: Stable cardiomegaly.  No edema or consolidation. Electronically Signed   By: Lowella Grip III M.D.   On: 09/28/2016 13:45    Procedures Procedures (including critical care time)  Medications Ordered in ED Medications  nitroGLYCERIN (NITROSTAT) SL tablet 0.4 mg (not administered)  nitroGLYCERIN (NITROSTAT) SL tablet 0.4 mg (0.4 mg Sublingual Given 09/28/16 1333)  aspirin chewable tablet 243 mg (243 mg Oral Given 09/28/16 1335)     Initial Impression /  Assessment and Plan / ED Course  I have reviewed the triage vital signs and the nursing notes.  Pertinent labs & imaging results that were available during my care of the patient were reviewed by me and considered in my medical decision making (see chart for details).  Clinical Course     76 year old female with extensive past medical history as above here with  ongoing chest pain. On arrival, initial EKG concerning for new junctional rhythm with a skate beat versus diffuse ischemia. Cardiology medially patient at bedside. She has no hypertension or evidence of significant hemodynamic instability at this time. Etiology is unclear, must consider ischemic event versus medication effect versus electrolyte abnormality. Will admit to cardiology and obtain broad labs. Patient in agreement with this plan. Chest pain is resolved with nitroglycerin glycerin. ASA given. Patient maintained on pacing pads as needed. [place] at bedside.  Final Clinical Impressions(s) / ED Diagnoses   Final diagnoses:  Third degree heart block [facility])    New Prescriptions New Prescriptions   No medications on file     Duffy Bruce, MD 09/28/16 1356

## 2016-09-28 NOTE — ED Notes (Signed)
Cardiologist at bedside.  

## 2016-09-28 NOTE — ED Notes (Signed)
Placed on cardiac ZOLL

## 2016-09-28 NOTE — ED Notes (Signed)
Pt pain returned, Cardiologist made aware.  Pt to cath lab at this time

## 2016-09-28 NOTE — ED Notes (Signed)
Cardiology at bedside.

## 2016-09-28 NOTE — ED Notes (Signed)
Pt attempted to use bedpan but could not urinate.  

## 2016-09-28 NOTE — H&P (Addendum)
Advanced Heart Failure Team History and Physical Note    Referring Physician: Dr. Ellender Hose Primary Physician:  Dr. Delfina Redwood Primary Cardiologist:  Dr. Aundra Dubin   Reason for Admission: CHB   HPI:    Christine Gay is a 76 y.o. female with PMH of CAD s/p CABG, DM, and HTN who presented to [facility] with CP. EKG pertinent for new Complete Heart Block.   Last seen in HF clinic 05/18/16. Has a history of chest pain but was relatively stable at this visit. Was stable from a CHF standpoint.  EKG at that visit with LBBB, sinus brady, and PACs.  Pertinent labs on admission include Troponin 0.00, WBC 7.1, BMET pending.  CXR with no acute changes. No edema or consolidation. Pt states pain started this am ~11 am. She described it as dull, aching, pressure-like pain with associated SOB. Initially improved with NTG x 2, but recurred up to 8/10 severity prompting visit to the ER.   States chest pain this am was the first she has had in several years. States she has been "falling" a lot since August. Denies any LOC. Reports 3 falls in October a\lone. Taking all medications as directed.  No current dizziness or lightheadedness. Denies palpitations. Still having some chest discomfort across the middle of her chest.   Review of Systems: [y] = yes, [ ]  = no   General: Weight gain [ ] ; Weight loss [ ] ; Anorexia [ ] ; Fatigue [ ] ; Fever [ ] ; Chills [ ] ; Weakness [ ]   Cardiac: Chest pain/pressure [y]; Resting SOB [y]; Exertional SOB [ ] ; Orthopnea [ ] ; Pedal Edema [ ] ; Palpitations [ ] ; Syncope [ ] ; Presyncope [ ] ; Paroxysmal nocturnal dyspnea[ ]   Pulmonary: Cough [ ] ; Wheezing[ ] ; Hemoptysis[ ] ; Sputum [ ] ; Snoring [ ]   GI: Vomiting[ ] ; Dysphagia[ ] ; Melena[ ] ; Hematochezia [ ] ; Heartburn[ ] ; Abdominal pain [ ] ; Constipation [ ] ; Diarrhea [ ] ; BRBPR [ ]   GU: Hematuria[ ] ; Dysuria [ ] ; Nocturia[ ]   Vascular: Pain in legs with walking [ ] ; Pain in feet with lying flat [ ] ; Non-healing sores [ ] ; Stroke [ ] ; TIA [ ] ;  Slurred speech [ ] ;  Neuro: Headaches[ ] ; Vertigo[ ] ; Seizures[ ] ; Paresthesias[ ] ;Blurred vision [ ] ; Diplopia [ ] ; Vision changes [ ]   Ortho/Skin: Arthritis [ ] ; Joint pain [ ] ; Muscle pain [ ] ; Joint swelling [ ] ; Back Pain [ ] ; Rash [ ]   Psych: Depression[ ] ; Anxiety[ ]   Heme: Bleeding problems [ ] ; Clotting disorders [ ] ; Anemia [ ]   Endocrine: Diabetes [ ] ; Thyroid dysfunction[ ]    Home Medications Prior to Admission medications   Medication Sig Start Date End Date Taking? Authorizing Provider  aspirin 81 MG tablet Take 81 mg by mouth daily.   Yes Historical Provider, MD  timolol (TIMOPTIC) 0.5 % ophthalmic solution Place 1 drop into both eyes daily. 09/21/16  Yes Historical Provider, MD  brimonidine-timolol (COMBIGAN) 0.2-0.5 % ophthalmic solution Place 1 drop into both eyes daily.     Historical Provider, MD  carvedilol (COREG) 6.25 MG tablet Take 0.5 tablets (3.125 mg total) by mouth 2 (two) times daily. 04/15/15   Larey Dresser, MD  ezetimibe (ZETIA) 10 MG tablet TAKE 1 TABLET BY MOUTH DAILY. 11/28/15   Larey Dresser, MD  ferrous sulfate 325 (65 FE) MG tablet Take 1 tablet (325 mg total) by mouth 2 (two) times daily with a meal. 06/12/14   Jonetta Osgood, MD  fluorometholone (FML) 0.1 %  ophthalmic suspension Place 1 drop into both eyes daily.  06/21/14   Historical Provider, MD  furosemide (LASIX) 40 MG tablet TAKE 1 TABLET BY MOUTH 2 TIMES DAILY. 07/12/16   Larey Dresser, MD  isosorbide mononitrate (IMDUR) 60 MG 24 hr tablet TAKE 2 TABLETS BY MOUTH 2 TIMES A DAY. 07/02/16   Larey Dresser, MD  isosorbide mononitrate (IMDUR) 60 MG 24 hr tablet TAKE 2 TABLETS BY MOUTH 2 TIMES A DAY. 09/21/16   Larey Dresser, MD  levothyroxine (SYNTHROID, LEVOTHROID) 100 MCG tablet Take 100 mcg by mouth daily before breakfast.    Historical Provider, MD  metFORMIN (GLUCOPHAGE-XR) 500 MG 24 hr tablet Take 500 mg by mouth 2 (two) times daily. 500 mg by mouth twice daily 05/08/13   Dayna N Dunn, PA-C    NITROSTAT 0.4 MG SL tablet PLACE 1 TABLET (0.4 MG TOTAL) UNDER THE TONGUE EVERY 5 (FIVE) MINUTES AS NEEDED FOR CHEST PAIN. 05/07/15   Larey Dresser, MD  pantoprazole (PROTONIX) 40 MG tablet TAKE 1 TABLET BY MOUTH DAILY. 06/01/16   Larey Dresser, MD  phenytoin (DILANTIN) 100 MG ER capsule Take 100 mg by mouth 3 (three) times daily.     Historical Provider, MD  potassium chloride SA (K-DUR,KLOR-CON) 20 MEQ tablet Take 1 tablet every morning and 1/2 tablet every evening. 05/19/16   Larey Dresser, MD  rosuvastatin (CRESTOR) 40 MG tablet TAKE 1 TABLET BY MOUTH DAILY. 04/12/16   Larey Dresser, MD    Past Medical History: Past Medical History:  Diagnosis Date  . Anemia    a. Noted on labs 04/2013.  . Anginal pain ([place])   . Arthritis    "legs" (06/10/2014)  . CAD (coronary artery disease)    a. s/p CABG 1992, b. s/p PCI several years ago; c. LHC 11/08: L-LAD ok, S-OM1 ok, S-OM2 CTO, prox to mid CFX 70% (unchanged), RCA non-dominant, occluded;  d.  Echo 2/11: EF 55%, mod LVH, Ao sclerosis;  e. Echo 6/14: mild LVH, mild focal basal sept hypertrophy, EF 55-65%, inf HK, Mild AI, mild MR, mild BAE;  f. Myoview 7/14: int risk-> cath 04/2013 s/p PTCA/DES to prox Cx 05/04/13.  . Chronic diastolic CHF (congestive heart failure) ([place])   . GERD (gastroesophageal reflux disease)   . Glaucoma   . High cholesterol   . Hypertension   . Hypothyroidism   . LBBB (left bundle branch block)    chronic  . Myocardial infarction 03/1991 X 2; 07/1991  . Obesity   . Seizures ([place])    "I'm on Dilantin" (06/10/2014)  . Sickle cell trait ([place])   . Type II diabetes mellitus ([place])     Past Surgical History: Past Surgical History:  Procedure Laterality Date  . CARDIAC CATHETERIZATION     "several;  they went up to check"  . CATARACT EXTRACTION W/ INTRAOCULAR LENS  IMPLANT, BILATERAL Bilateral   . CHOLECYSTECTOMY    . COLONOSCOPY N/A 06/12/2014   Procedure: COLONOSCOPY;  Surgeon: Cleotis Nipper, MD;  Location: [facility]  ENDOSCOPY;  Service: Endoscopy;  Laterality: N/A;  . CORONARY ANGIOPLASTY WITH STENT PLACEMENT  ? date; 05/04/2013   ? location; DES  to [place]  . CORONARY ARTERY BYPASS GRAFT  07/1991   "CABG X 3"  . ESOPHAGOGASTRODUODENOSCOPY N/A 06/12/2014   Procedure: ESOPHAGOGASTRODUODENOSCOPY (EGD);  Surgeon: Cleotis Nipper, MD;  [facility] ENDOSCOPY;  Service: Endoscopy;  Laterality: N/A;  . LEFT AND RIGHT HEART CATHETERIZATION WITH CORONARY ANGIOGRAM N/A  05/04/2013   Procedure: LEFT AND RIGHT HEART CATHETERIZATION WITH CORONARY ANGIOGRAM;  Surgeon: Larey Dresser, MD;  Location[facility] CATH LAB;  Service: Cardiovascular;  Laterality: N/A;  . PERCUTANEOUS STENT INTERVENTION  05/04/2013   Procedure: PERCUTANEOUS STENT INTERVENTION;  Surgeon: Larey Dresser, MD;  Location: [facility] CATH LAB;  Service: Cardiovascular;;  . TUBAL LIGATION Bilateral 1969    Family History:  Family History  Problem Relation Age of Onset  . Diabetes Mother     Social History: Social History   Social History  . Marital status: Married    Spouse name: N/A  . Number of children: N/A  . Years of education: N/A   Social History Main Topics  . Smoking status: Never Smoker  . Smokeless tobacco: Never Used  . Alcohol use No  . Drug use: No  . Sexual activity: No   Other Topics Concern  . None   Social History Narrative  . None    Allergies:  Allergies  Allergen Reactions  . Meperidine Hcl Other (See Comments)    Demerol - mental status changes    . Nitroglycerin Other (See Comments)    Patch and Cream only - blisters  . Morphine Palpitations    Objective:    Vital Signs:   Temp:  [97.5 F (36.4 C)] 97.5 F (36.4 C) (01/02 1250) Pulse Rate:  [43-57] 43 (01/02 1330) Resp:  [16-17] 16 (01/02 1330) BP: (117-149)/(58-63) 126/58 (01/02 1330) SpO2:  [99 %-100 %] 99 % (01/02 1330) Weight:  [170 lb (77.1 kg)] 170 lb (77.1 kg) (01/02 1250)   Filed Weights   09/28/16 1250  Weight: 170 lb (77.1 kg)     Physical Exam: General:  Elderly appearing. HEENT: Normal Neck: supple. JVP 8 cm. Carotids 2+ bilat; no bruits. No lymphadenopathy or thyromegaly appreciated. Cor: PMI nondisplaced. Bradycardic, Irregular.  No rubs, gallops or murmurs. Lungs: CTAB, normal effort Abdomen: soft, nontender, nondistended. No hepatosplenomegaly. No bruits or masses. Good bowel sounds. Extremities: no cyanosis, clubbing, rash. Trace ankle edema.  Neuro: alert & oriented x 3, cranial nerves grossly intact. moves all 4 extremities w/o difficulty. Affect pleasant.  Telemetry: Reviewed personally, CHB with disassociated p waves. Rates in 68s.   Labs: Basic Metabolic Panel: No results for input(s): NA, K, CL, CO2, GLUCOSE, BUN, CREATININE, CALCIUM, MG, PHOS in the last 168 hours.  Liver Function Tests: No results for input(s): AST, ALT, ALKPHOS, BILITOT, PROT, ALBUMIN in the last 168 hours. No results for input(s): LIPASE, AMYLASE in the last 168 hours. No results for input(s): AMMONIA in the last 168 hours.  CBC:  Recent Labs Lab 09/28/16 1256  WBC 7.1  HGB 12.4  HCT 37.6  MCV 97.4  PLT 190    Cardiac Enzymes: No results for input(s): CKTOTAL, CKMB, CKMBINDEX, TROPONINI in the last 168 hours.  BNP: BNP (last 3 results)  Recent Labs  05/18/16 1147  BNP 207.9*    ProBNP (last 3 results) No results for input(s): PROBNP in the last 8760 hours.   CBG: No results for input(s): GLUCAP in the last 168 hours.  Coagulation Studies: No results for input(s): LABPROT, INR in the last 72 hours.  Other results: EKG: Junctional rhythm 44 bpm, P waves disassociated from QRS complexes. + RBBB  Imaging: Dg Chest Port 1 View  Result Date: 09/28/2016 CLINICAL DATA:  Chest pain for 1 day EXAM: PORTABLE CHEST 1 VIEW COMPARISON:  April 09, 2015 FINDINGS: There is no edema or consolidation. Heart is mildly enlarged  with pulmonary vascularity within normal limits. No adenopathy. Patient is status post  median sternotomy. No bone lesions. IMPRESSION: Stable cardiomegaly.  No edema or consolidation. Electronically Signed   By: Lowella Grip III M.D.   On: 09/28/2016 13:45      Assessment/Plan   Meilany Nippert Coll is a 76 y.o. female with PMH of CAD s/p CABG, DM, and HTN who presented to [facility] with CP and SOB. EKG pertinent for new Complete Heart Block.   1. Complete Heart Block - New diagnosis, Has LBBB at baseline, now RBBB escape. EP consult pending - Hold BB for now.  - She is currently hemodynamically stable.  2. Diastolic CHF - Previously normal EF in 10/2014.   - Will repeat Echo this admission - Continue lasix 40 mg daily. Volume status seems relatively stable. 3. CAD s/p CABG - with CP on admission resolved by NTG. Now recurrent. With history ischemia is a possibility. May need to consider cath. - Lexiscan Cardiolite in 2/16 showed a small, mid anterior partially reversible defect that may have been due to soft tissue attenuation (low risk) - Cardaic catheterization in 8/14 that showed patent LIMA-LAD, occluded SVG-OM1, and 95% ostial LCx stenosis.  She had PCI to ostial LCx with Promus DES 4. HTN - Continue Imdur - Holding coreg as above.  5. DM II - Can cover with SSI while in house.   We will admit for further evaluation, treatment, and EP evaluation.   Length of Stay: 0   Annamaria Helling 09/28/2016, 2:03 PM  Advanced Heart Failure Team Pager [phone] (M-F; 7a - 4p)  Please contact [facility] for night-coverage after hours (4p -7a ) and weekends on [email or web address]  Patient seen with PA, agree with the above note.  Patient has had increased fatigue and increased exertional dyspnea for several weeks now.  Over the last 6 months, she has had unexplained falls => without loss of consciousness.  This morning, she developed left-sided chest pain.  This has been on and off since then, NTG has helped.  Initial troponin negative.  She was noted to be in CHB in the  ER with HR in 40s and RBBB escape.  1. Complete heart block: HR in 40s with RBBB escape.  BP stable.  Baseline NSR with LBBB, HR usually in 50s. ?Idiopathic conduction system degeneration versus NSTEMI/worsening of CAD.   - Stop Coreg.  - Echo - Will plan coronary angiography, likely in am as long as pain can be controlled and troponin not way up.  Unless she has high grade lesion in her dominant LCx that could explain the arrhythmia, she will need PPM.  2. CAD: h/o CABG.  Last intervention was PCI to dominant LCx in 2014.  She has had chest pain on and off since then, none in the last few months until today in association with CHB and bradycardia.  Initial troponin negative.  - Repeat troponin now and cycle.  If elevated, will need to go on heparin gtt.   - Continue statin/ASA.  - Will need angiography prior to PPM, plan in am if troponin negative/low grade elevation.  Plan today if troponin way up or pain intractable.  3. Chronic diastolic CHF: Mildly volume up in setting of CHB.  Continue home Lasix.  4. Type II diabetes: Hold metformin pre-PCI.   [name] 09/28/2016 3:16 PM   Repeat troponin 0.09.  I talked with her, chest pain completely resolved and BP stable.  She will go to step-down  with plan for coronary angiography in am followed by possible PPM.  Will start heparin gtt.   [name] 09/28/2016 4:44 PM   Called from ER regarding additional chest pain despite NTG gtt.  Will increase and will give more Fentanyl.  With ongoing chest pain, I think it would be best to proceed with angiography.  Will arrange for cath this evening.   [name] 09/28/2016 5:42 PM

## 2016-09-28 NOTE — ED Notes (Signed)
Cardiology PA at bedside. 

## 2016-09-28 NOTE — ED Triage Notes (Signed)
Pt presents for evaluation of L sided CP starting around 1100. Pt denies radiation or associated symptoms. Pt reports having baby ASA this AM and 2 nitro PTA. Pt reports pain initially eased but has elevated again. Pt AxO x4.

## 2016-09-28 NOTE — ED Notes (Signed)
Pt st's chest pain is now a #8 on pain scale 0/10.  Fentanyl 65mcg IV given for same,.

## 2016-09-28 NOTE — Interval H&P Note (Signed)
History and Physical Interval Note:  09/28/2016 6:21 PM  Christine Gay  has presented today for surgery, with the diagnosis of cad  The various methods of treatment have been discussed with the patient and family. After consideration of risks, benefits and other options for treatment, the patient has consented to  Procedure(s): Left Heart Cath and Coronary Angiography (N/A) as a surgical intervention .  The patient's history has been reviewed, patient examined, no change in status, stable for surgery.  I have reviewed the patient's chart and labs.  Questions were answered to the patient's satisfaction.    Cath Lab Visit (complete for each Cath Lab visit)  Clinical Evaluation Leading to the Procedure:   ACS: Yes.    Non-ACS:    Anginal Classification: CCS IV  Anti-ischemic medical therapy: Maximal Therapy (2 or more classes of medications)  Non-Invasive Test Results: No non-invasive testing performed  Prior CABG: Previous CABG       [name] 09/28/2016 6:21 PM

## 2016-09-29 ENCOUNTER — Encounter (HOSPITAL_COMMUNITY)
Admission: EM | Disposition: A | Discharge status: Home Health | Payer: Self-pay | Source: Home / Self Care | Attending: Cardiology

## 2016-09-29 ENCOUNTER — Encounter (HOSPITAL_COMMUNITY): Payer: Self-pay | Admitting: Cardiology

## 2016-09-29 ENCOUNTER — Inpatient Hospital Stay (HOSPITAL_COMMUNITY): Payer: Medicare Other

## 2016-09-29 DIAGNOSIS — I255 Ischemic cardiomyopathy: Secondary | ICD-10-CM

## 2016-09-29 HISTORY — PX: EP IMPLANTABLE DEVICE: SHX172B

## 2016-09-29 LAB — ECHOCARDIOGRAM COMPLETE
AVPHT: 671 ms
CHL CUP DOP CALC LVOT VTI: 22.5 cm
E/e' ratio: 15.63
EWDT: 243 ms
FS: 20 % — AB (ref 28–44)
Height: 60 in
IV/PV OW: 1.09
LA ID, A-P, ES: 31 mm
LA diam index: 1.73 cm/m2
LA vol A4C: 34.3 ml
LA vol: 43.1 mL
LAVOLIN: 24.1 mL/m2
LDCA: 2.54 cm2
LEFT ATRIUM END SYS DIAM: 31 mm
LV E/e'average: 15.63
LV TDI E'MEDIAL: 7.23
LVEEMED: 15.63
LVOT peak grad rest: 5 mmHg
LVOT peak vel: 116 cm/s
LVOTD: 18 mm
LVOTSV: 57 mL
MV Dec: 243
MV Peak grad: 5 mmHg
MV pk E vel: 113 m/s
PW: 12.4 mm — AB (ref 0.6–1.1)
RV LATERAL S' VELOCITY: 9.58 cm/s
TAPSE: 12.4 mm
Weight: 2881.85 oz

## 2016-09-29 LAB — BASIC METABOLIC PANEL
Anion gap: 7 (ref 5–15)
Anion gap: 7 (ref 5–15)
BUN: 10 mg/dL (ref 6–20)
BUN: 10 mg/dL (ref 6–20)
CALCIUM: 8.3 mg/dL — AB (ref 8.9–10.3)
CO2: 27 mmol/L (ref 22–32)
CO2: 26 mmol/L (ref 22–32)
CREATININE: 0.98 mg/dL (ref 0.44–1.00)
CREATININE: 0.92 mg/dL (ref 0.44–1.00)
Calcium: 8.2 mg/dL — ABNORMAL LOW (ref 8.9–10.3)
Chloride: 107 mmol/L (ref 101–111)
Chloride: 109 mmol/L (ref 101–111)
GFR calc non Af Amer: 55 mL/min — ABNORMAL LOW (ref >60)
GFR, EST NON AFRICAN AMERICAN: 59 mL/min — AB (ref >60)
Glucose, Bld: 108 mg/dL — ABNORMAL HIGH (ref 65–99)
Glucose, Bld: 102 mg/dL — ABNORMAL HIGH (ref 65–99)
POTASSIUM: 3.5 mmol/L (ref 3.5–5.1)
Potassium: 4 mmol/L (ref 3.5–5.1)
SODIUM: 142 mmol/L (ref 135–145)
Sodium: 141 mmol/L (ref 135–145)

## 2016-09-29 LAB — CBC
HEMATOCRIT: 34.6 % — AB (ref 36.0–46.0)
HEMOGLOBIN: 11.8 g/dL — AB (ref 12.0–15.0)
MCH: 32.3 pg (ref 26.0–34.0)
MCHC: 34.1 g/dL (ref 30.0–36.0)
MCV: 94.8 fL (ref 78.0–100.0)
Platelets: 178 10*3/uL (ref 150–400)
RBC: 3.65 MIL/uL — AB (ref 3.87–5.11)
RDW: 12.3 % (ref 11.5–15.5)
WBC: 6.7 10*3/uL (ref 4.0–10.5)

## 2016-09-29 LAB — MRSA PCR SCREENING: MRSA BY PCR: NEGATIVE

## 2016-09-29 LAB — TROPONIN I: Troponin I: 4.94 ng/mL (ref <0.03)

## 2016-09-29 SURGERY — PACEMAKER IMPLANT

## 2016-09-29 MED ORDER — ONDANSETRON HCL 4 MG/2ML IJ SOLN: 4.0000 mg | Freq: Four times a day (QID) | INTRAMUSCULAR | Status: DC | PRN

## 2016-09-29 MED ORDER — HEPARIN (PORCINE) IN NACL 2-0.9 UNIT/ML-% IJ SOLN
INTRAMUSCULAR | Status: DC | PRN
  Administered 2016-09-29: 15:00:00

## 2016-09-29 MED ORDER — SODIUM CHLORIDE 0.9 % IV SOLN
INTRAVENOUS | Status: DC
  Administered 2016-09-29: 11:00:00 via INTRAVENOUS

## 2016-09-29 MED ORDER — LIDOCAINE HCL (PF) 1 % IJ SOLN
INTRAMUSCULAR | Status: DC | PRN
  Administered 2016-09-29: 71 mL

## 2016-09-29 MED ORDER — FENTANYL CITRATE (PF) 100 MCG/2ML IJ SOLN
INTRAMUSCULAR | Status: DC | PRN
  Administered 2016-09-29: 25 ug via INTRAVENOUS

## 2016-09-29 MED ORDER — LIDOCAINE HCL (PF) 1 % IJ SOLN
INTRAMUSCULAR | Status: AC
  Filled 2016-09-29: qty 30

## 2016-09-29 MED ORDER — LIDOCAINE HCL (PF) 1 % IJ SOLN
INTRAMUSCULAR | Status: AC
  Filled 2016-09-29: qty 60

## 2016-09-29 MED ORDER — CHLORHEXIDINE GLUCONATE 4 % EX LIQD
CUTANEOUS | Status: AC
  Filled 2016-09-29: qty 15

## 2016-09-29 MED ORDER — MIDAZOLAM HCL 5 MG/5ML IJ SOLN
INTRAMUSCULAR | Status: DC | PRN
  Administered 2016-09-29: 2 mg via INTRAVENOUS

## 2016-09-29 MED ORDER — SODIUM CHLORIDE 0.9 % IR SOLN
Status: AC
  Filled 2016-09-29: qty 2

## 2016-09-29 MED ORDER — CEFAZOLIN SODIUM-DEXTROSE 2-4 GM/100ML-% IV SOLN
2.0000 g | INTRAVENOUS | Status: AC
  Administered 2016-09-29: 2 g via INTRAVENOUS

## 2016-09-29 MED ORDER — MIDAZOLAM HCL 5 MG/5ML IJ SOLN
INTRAMUSCULAR | Status: AC
  Filled 2016-09-29: qty 5

## 2016-09-29 MED ORDER — CHLORHEXIDINE GLUCONATE 4 % EX LIQD
60.0000 mL | Freq: Once | CUTANEOUS | Status: AC
  Administered 2016-09-29: 4 via TOPICAL
  Filled 2016-09-29: qty 60

## 2016-09-29 MED ORDER — HEPARIN (PORCINE) IN NACL 2-0.9 UNIT/ML-% IJ SOLN
INTRAMUSCULAR | Status: AC
Start: 2016-09-29 — End: 2016-09-29
  Filled 2016-09-29: qty 500

## 2016-09-29 MED ORDER — ACETAMINOPHEN 325 MG PO TABS: 325.0000 mg | ORAL_TABLET | ORAL | Status: DC | PRN

## 2016-09-29 MED ORDER — CEFAZOLIN IN D5W 1 GM/50ML IV SOLN
1.0000 g | Freq: Three times a day (TID) | INTRAVENOUS | Status: DC
  Administered 2016-09-29 – 2016-09-30 (×2): 1 g via INTRAVENOUS
  Filled 2016-09-29 (×3): qty 50

## 2016-09-29 MED ORDER — CEFAZOLIN SODIUM-DEXTROSE 2-4 GM/100ML-% IV SOLN
INTRAVENOUS | Status: AC
  Filled 2016-09-29: qty 100

## 2016-09-29 MED ORDER — SODIUM CHLORIDE 0.9 % IR SOLN
Status: DC | PRN
  Administered 2016-09-29: 500 mL

## 2016-09-29 MED ORDER — GENTAMICIN SULFATE 40 MG/ML IJ SOLN: 80.0000 mg | INTRAMUSCULAR | Status: DC

## 2016-09-29 MED ORDER — CHLORHEXIDINE GLUCONATE 4 % EX LIQD
60.0000 mL | Freq: Once | CUTANEOUS | Status: AC
  Filled 2016-09-29: qty 60

## 2016-09-29 MED ORDER — SODIUM CHLORIDE 0.9 % IV SOLN: INTRAVENOUS | Status: DC

## 2016-09-29 MED ORDER — FENTANYL CITRATE (PF) 100 MCG/2ML IJ SOLN
INTRAMUSCULAR | Status: AC
  Filled 2016-09-29: qty 2

## 2016-09-29 SURGICAL SUPPLY — 7 items
CABLE SURGICAL S-101-97-12 (CABLE) ×2 IMPLANT
LEAD TENDRIL MRI 46CM LPA1200M (Lead) ×2 IMPLANT
LEAD TENDRIL MRI 52CM LPA1200M (Lead) ×2 IMPLANT
PACEMAKER ASSURITY DR-RF (Pacemaker) ×2 IMPLANT
PAD DEFIB LIFELINK (PAD) ×2 IMPLANT
SHEATH CLASSIC 8F (SHEATH) ×4 IMPLANT
TRAY PACEMAKER INSERTION (PACKS) ×2 IMPLANT

## 2016-09-29 NOTE — Progress Notes (Signed)
  Echocardiogram 2D Echocardiogram has been performed.  Christine Gay 09/29/2016, 9:50 AM

## 2016-09-29 NOTE — Progress Notes (Addendum)
Patient ID: Christine Gay, female   DOB: 1941-09-15, 76 y.o.   MRN: [id number]   SUBJECTIVE: Patient admitted with CHB with RBBB escape in 40s.  She had continuous chest pain and troponin increased to 4.94.  Therefore, she was taken to cath lab last night, showing no change in coronary anatomy.    HR now in 50s, up from 40s overnight, but rhythm still appears to be heart block. She is no longer having chest pain, not dyspneic.   ECG this am: CHB with RBBB escape, rate in 50s now.   LHC (09/28/16): Patent LIMA-LAD and SVG-OM1.  Known SVG-OM2 occlusion.  Known occluded nondominant RCA.  Patent proximal-mid LCx stent.   Scheduled Meds: . aspirin EC  81 mg Oral Daily  . cycloSPORINE  1 drop Both Eyes BID  . ezetimibe  10 mg Oral Daily  . ferrous sulfate  325 mg Oral Q breakfast  . furosemide  40 mg Oral BID  . heparin  5,000 Units Subcutaneous Q8H  . isosorbide mononitrate  60 mg Oral BID  . levothyroxine  100 mcg Oral QAC breakfast  . pantoprazole  40 mg Oral Daily  . phenytoin  100 mg Oral QHS  . phenytoin  200 mg Oral Daily  . potassium chloride  10 mEq Oral Daily  . potassium chloride SA  20 mEq Oral Daily  . rosuvastatin  40 mg Oral q1800  . sodium chloride flush  3 mL Intravenous Q12H  . sodium chloride flush  3 mL Intravenous Q12H  . sodium chloride flush  3 mL Intravenous Q12H  . timolol  1 drop Both Eyes Daily   Continuous Infusions: . sodium chloride 10 mL/hr at 09/28/16 2347  . nitroGLYCERIN Stopped (09/28/16 1853)   PRN Meds:.sodium chloride, sodium chloride, sodium chloride, acetaminophen, nitroGLYCERIN, ondansetron (ZOFRAN) IV, sodium chloride flush, sodium chloride flush, sodium chloride flush    Vitals:   09/29/16 0400 09/29/16 0500 09/29/16 0600 09/29/16 0700  BP: (!) 113/59 103/61 100/60 (!) 122/55  Pulse: (!) 47 (!) 50 (!) 51 (!) 54  Resp: (!) 21 19 (!) 21 (!) 25  Temp:      TempSrc:      SpO2: 97% 99% 95% 97%  Weight:      Height:        Intake/Output  Summary (Last 24 hours) at 09/29/16 0741 Last data filed at 09/29/16 0600  Gross per 24 hour  Intake           353.87 ml  Output             1350 ml  Net          -996.13 ml    LABS: Basic Metabolic Panel:  Recent Labs  09/28/16 1256 09/28/16 2103 09/29/16 0321  NA 139 140 141  K 4.0 3.9 4.0  CL 106 107 107  CO2 25 22 27   GLUCOSE 109* 124* 108*  BUN 9 9 10   CREATININE 1.30* 1.02* 0.98  CALCIUM 8.3* 8.3* 8.3*  MG 2.0  --   --    Liver Function Tests: No results for input(s): AST, ALT, ALKPHOS, BILITOT, PROT, ALBUMIN in the last 72 hours. No results for input(s): LIPASE, AMYLASE in the last 72 hours. CBC:  Recent Labs  09/28/16 2103 09/29/16 0321  WBC 7.0 6.7  HGB 12.1 11.8*  HCT 35.1* 34.6*  MCV 95.4 94.8  PLT 183 178   Cardiac Enzymes:  Recent Labs  09/28/16 1531 09/28/16 2103 09/29/16 0321  TROPONINI 0.09* 2.81* 4.94*   BNP: Invalid input(s): POCBNP D-Dimer: No results for input(s): DDIMER in the last 72 hours. Hemoglobin A1C: No results for input(s): HGBA1C in the last 72 hours. Fasting Lipid Panel: No results for input(s): CHOL, HDL, LDLCALC, TRIG, CHOLHDL, LDLDIRECT in the last 72 hours. Thyroid Function Tests:  Recent Labs  09/28/16 1531  TSH 2.202   Anemia Panel: No results for input(s): VITAMINB12, FOLATE, FERRITIN, TIBC, IRON, RETICCTPCT in the last 72 hours.  RADIOLOGY: Dg Chest Port 1 View  Result Date: 09/28/2016 CLINICAL DATA:  Chest pain for 1 day EXAM: PORTABLE CHEST 1 VIEW COMPARISON:  April 09, 2015 FINDINGS: There is no edema or consolidation. Heart is mildly enlarged with pulmonary vascularity within normal limits. No adenopathy. Patient is status post median sternotomy. No bone lesions. IMPRESSION: Stable cardiomegaly.  No edema or consolidation. Electronically Signed   By: Lowella Grip III M.D.   On: 09/28/2016 13:45    PHYSICAL EXAM General: NAD Neck: JVP 8 cm, no thyromegaly or thyroid nodule.  Lungs: Clear to  auscultation bilaterally with normal respiratory effort. CV: Nondisplaced PMI.  Heart regular S1/S2, no S3/S4, no murmur.  Trace ankle edema.   Abdomen: Soft, nontender, no hepatosplenomegaly, no distention.  Neurologic: Alert and oriented x 3.  Psych: Normal affect. Extremities: No clubbing or cyanosis.   TELEMETRY: Reviewed telemetry pt in HR 50s, suspect ongoing CHB  ASSESSMENT AND PLAN: 76 yo with history of CAD s/p CABG and diastolic CHF presented with complete heart block.  Patient has had increased fatigue and increased exertional dyspnea for several weeks now.  Over the last 6 months, she has had unexplained falls => without loss of consciousness.  In am of 09/28/16, she developed left-sided chest pain, NTG has helped.  She was noted to be in CHB in the ER with HR in 40s and RBBB escape.  1. Complete heart block: HR in 40s with RBBB escape initially.  Coreg stopped, now rate in the 50s off Coreg.  BP stable.  Baseline NSR with LBBB, HR usually in 50s. Cardiac cath showed no change to coronary anatomy.  Suspect idiopathic conduction system degeneration.  - Echo: If EF significantly below 50%, would attempt LV lead.  - Even though HR is higher, with ongoing CHB, I think that she will need PPM.  Will discuss with EP.   2. CAD: h/o CABG.  Last intervention was PCI to dominant LCx in 2014.  She was admitted 1/2 in the pm with chest pain that was ongoing, troponin up to 4.94.  Given ongoing CP and elevated troponin in setting of CHB, coronary angiography was done.  This showed no change to her coronary anatomy from prior cath. Chest pain resolved with higher HR (now in 50s).  - Continue ASA, statin, Imdur.    - Continue statin/ASA.  3. Chronic diastolic CHF: Mildly volume up in setting of CHB.  Continue home Lasix.  4. Type II diabetes: SSI, metformin held.  [name] 09/29/2016 7:54 AM

## 2016-09-29 NOTE — Progress Notes (Signed)
Pt's belongings transferred to [place] room 21. 3w staff notified of drop off.

## 2016-09-29 NOTE — Progress Notes (Signed)
Attempting to place second IV prior to pacemaker implantation. While trying to obtain second IV site, first IV on r. Arm infiltrated. IV team consulted for access. PA [place] notified of patient lack of IV access, and no IV fluids running due to lack of access.

## 2016-09-29 NOTE — Progress Notes (Signed)
IV team unable to get access. PA Charlcie Cradle notified. Will continue to monitor.

## 2016-09-29 NOTE — Progress Notes (Signed)
SUBJECTIVE: The patient is doing well today.  At this time, she denies any ongoing chest pain, shortness of breath, or any new concerns.  Marland Kitchen aspirin EC  81 mg Oral Daily  . cycloSPORINE  1 drop Both Eyes BID  . ezetimibe  10 mg Oral Daily  . ferrous sulfate  325 mg Oral Q breakfast  . furosemide  40 mg Oral BID  . heparin  5,000 Units Subcutaneous Q8H  . isosorbide mononitrate  60 mg Oral BID  . levothyroxine  100 mcg Oral QAC breakfast  . pantoprazole  40 mg Oral Daily  . phenytoin  100 mg Oral QHS  . phenytoin  200 mg Oral Daily  . potassium chloride  10 mEq Oral Daily  . potassium chloride SA  20 mEq Oral Daily  . rosuvastatin  40 mg Oral q1800  . sodium chloride flush  3 mL Intravenous Q12H  . sodium chloride flush  3 mL Intravenous Q12H  . sodium chloride flush  3 mL Intravenous Q12H  . timolol  1 drop Both Eyes Daily   . sodium chloride 10 mL/hr at 09/28/16 2347  . nitroGLYCERIN Stopped (09/28/16 1853)    OBJECTIVE: Physical Exam: Vitals:   09/29/16 0600 09/29/16 0700 09/29/16 0745 09/29/16 0759  BP: 100/60 (!) 122/55    Pulse: (!) 51 (!) 54 (!) 55   Resp: (!) 21 (!) 25 (!) 21   Temp:    97.6 F (36.4 C)  TempSrc:    Oral  SpO2: 95% 97% 97%   Weight:      Height:        Intake/Output Summary (Last 24 hours) at 09/29/16 0825 Last data filed at 09/29/16 0600  Gross per 24 hour  Intake           353.87 ml  Output             1350 ml  Net          -996.13 ml    Telemetry is reviewed by myself is CHB, V rate 40's-50's this morning  GEN- The patient is well appearing, alert and oriented x 3 today.   Head- normocephalic, atraumatic Eyes-  Sclera clear, conjunctiva pink Ears- hearing intact Oropharynx- clear Neck- supple, no JVP Lungs- Clear to ausculation bilaterally, normal work of breathing Heart- Regular rate and rhythm, bradycardic, no significant murmurs, no rubs or gallops GI- soft, NT, ND Extremities- no clubbing, cyanosis, or edema Skin- no  rash or lesion Psych- euthymic mood, full affect Neuro- no gross deficits appreciated  LABS: Basic Metabolic Panel:  Recent Labs  09/28/16 1256 09/28/16 2103 09/29/16 0321  NA 139 140 141  K 4.0 3.9 4.0  CL 106 107 107  CO2 25 22 27   GLUCOSE 109* 124* 108*  BUN 9 9 10   CREATININE 1.30* 1.02* 0.98  CALCIUM 8.3* 8.3* 8.3*  MG 2.0  --   --    CBC:  Recent Labs  09/28/16 2103 09/29/16 0321  WBC 7.0 6.7  HGB 12.1 11.8*  HCT 35.1* 34.6*  MCV 95.4 94.8  PLT 183 178   Cardiac Enzymes:  Recent Labs  09/28/16 1531 09/28/16 2103 09/29/16 0321  TROPONINI 0.09* 2.81* 4.94*   Thyroid Function Tests:  Recent Labs  09/28/16 1531  TSH 2.202   RADIOLOGY: Dg Chest Port 1 View Result Date: 09/28/2016 CLINICAL DATA:  Chest pain for 1 day EXAM: PORTABLE CHEST 1 VIEW COMPARISON:  April 09, 2015 FINDINGS: There is no edema  or consolidation. Heart is mildly enlarged with pulmonary vascularity within normal limits. No adenopathy. Patient is status post median sternotomy. No bone lesions. IMPRESSION: Stable cardiomegaly.  No edema or consolidation. Electronically Signed   By: Lowella Grip III M.D.   On: 09/28/2016 13:45    ASSESSMENT AND PLAN:   1. CP     Hx of CAD     Troponin elevation > cathed yesterday, stable CAD, no intervention, LVEF 45-50%      Likely demand ischemia/bradycardia  2. CHB with RBBB rates 40's     Baseline is LBBB     last dose of coreg 0800 yesterday     Christine Gay wait 5 half-lives plan for PPM implant this afternoon if heart block perisists     BP is stable     TSH wnl      Risks/benefits of PPM implant discussed with the patient by dr. Curt Bears this morning, she is agreeable to proceed   Pending echo +/- LV lead  3. HTN     BP ok  4.  Falls      Unclear mechanism by her description is positive she is not fainting   5. CHF     + DOE lately, CXR clear     not an active c/o at rest   Christine Standard, PA-C 09/29/2016 8:25 AM  I have seen and  examined this patient with Christine Gay.  Agree with above, note added to reflect my findings.  On exam, regular rhythm, no murmurs, lungs clear. Presented in complete AV block.  Was taking coreg which has been stopped.  Continued complete AV block. Also had chest pain with troponin that rose to 5 without changes on her ECG. Feeling improved today.  No chest pain. Due to continued heart block, plan for pacemaker placement.  Risks and benefits discussed.  Risks include but not limited to bleeding, infection, tamponade, pneumothorax.  The patient understands the risks and has agreed to the procedure.  Christine Gay M. Geneive Sandstrom MD 09/29/2016 8:35 AM

## 2016-09-30 ENCOUNTER — Encounter (HOSPITAL_COMMUNITY): Payer: Self-pay | Admitting: Cardiology

## 2016-09-30 ENCOUNTER — Inpatient Hospital Stay (HOSPITAL_COMMUNITY): Payer: Medicare Other

## 2016-09-30 LAB — CBC
HEMATOCRIT: 35.9 % — AB (ref 36.0–46.0)
HEMOGLOBIN: 12.3 g/dL (ref 12.0–15.0)
MCH: 32.2 pg (ref 26.0–34.0)
MCHC: 34.3 g/dL (ref 30.0–36.0)
MCV: 94 fL (ref 78.0–100.0)
Platelets: 197 10*3/uL (ref 150–400)
RBC: 3.82 MIL/uL — AB (ref 3.87–5.11)
RDW: 12.3 % (ref 11.5–15.5)
WBC: 7.2 10*3/uL (ref 4.0–10.5)

## 2016-09-30 LAB — BASIC METABOLIC PANEL
ANION GAP: 7 (ref 5–15)
BUN: 13 mg/dL (ref 6–20)
CHLORIDE: 104 mmol/L (ref 101–111)
CO2: 27 mmol/L (ref 22–32)
Calcium: 8.3 mg/dL — ABNORMAL LOW (ref 8.9–10.3)
Creatinine, Ser: 1.09 mg/dL — ABNORMAL HIGH (ref 0.44–1.00)
GFR calc non Af Amer: 48 mL/min — ABNORMAL LOW (ref >60)
GFR, EST AFRICAN AMERICAN: 56 mL/min — AB (ref >60)
Glucose, Bld: 108 mg/dL — ABNORMAL HIGH (ref 65–99)
POTASSIUM: 4 mmol/L (ref 3.5–5.1)
SODIUM: 138 mmol/L (ref 135–145)

## 2016-09-30 MED ORDER — ACETAMINOPHEN 325 MG PO TABS: 325.0000 mg | ORAL_TABLET | ORAL | Status: AC | PRN

## 2016-09-30 MED ORDER — CARVEDILOL 3.125 MG PO TABS
3.1250 mg | ORAL_TABLET | Freq: Two times a day (BID) | ORAL | Status: DC
  Administered 2016-09-30: 3.125 mg via ORAL
  Filled 2016-09-30: qty 1

## 2016-09-30 NOTE — Care Management Note (Signed)
Case Management Note  Patient Details  Name: Christine Gay MRN: [id number] Date of Birth: 06/26/1941  Subjective/Objective: Pt presented for CHB- sp pacemaker. Plan for d/c home today with support of daughter.                    Action/Plan:CM did discuss disposition needs with pt and she will need HHRN for disease management and v/s checks. CM did provide agency list and pt chose [facility] for services. CM did make referral and SOC to begin within 24-48 hours post d/c. No further needs at this time.   Expected Discharge Date:                  Expected Discharge Plan:  [place]  In-House Referral:  NA  Discharge planning Services  CM Consult  Post Acute Care Choice:  Home Health Choice offered to:  Patient  DME Arranged:  N/A DME Agency:  NA  HH Arranged:  RN [place] Agency:  [place]  Status of Service:  Completed, signed off  If discussed at [place] of Stay Meetings, dates discussed:    Additional Comments:  Bethena Roys, RN 09/30/2016, 1:08 PM

## 2016-09-30 NOTE — Discharge Instructions (Signed)
Supplemental Discharge Instructions for  Pacemaker/Defibrillator Patients  Activity No heavy lifting or vigorous activity with your left/right arm for 6 to 8 weeks.  Do not raise your left/right arm above your head for one week.  Gradually raise your affected arm as drawn below.             10/03/16                      10/04/16                       10/05/16                     10/06/16 __  NO DRIVING for  1 week   ; you may begin driving on  [id number]   .  WOUND CARE - Keep the wound area clean and dry.  Do not get this area wet for one week. No showers for one week; you may shower on  10/06/16   . - The tape/steri-strips on your wound will fall off; do not pull them off.  No bandage is needed on the site.  DO  NOT apply any creams, oils, or ointments to the wound area. - If you notice any drainage or discharge from the wound, any swelling or bruising at the site, or you develop a fever > 101? F after you are discharged home, call the office at once.  Special Instructions - You are still able to use cellular telephones; use the ear opposite the side where you have your pacemaker/defibrillator.  Avoid carrying your cellular phone near your device. - When traveling through airports, show security personnel your identification card to avoid being screened in the metal detectors.  Ask the security personnel to use the hand wand. - Avoid arc welding equipment, MRI testing (magnetic resonance imaging), TENS units (transcutaneous nerve stimulators).  Call the office for questions about other devices. - Avoid electrical appliances that are in poor condition or are not properly grounded. - Microwave ovens are safe to be near or to operate.  Additional information for defibrillator patients should your device go off: - If your device goes off ONCE and you feel fine afterward, notify the device clinic nurses. - If your device goes off ONCE and you do not feel well afterward, call 911. - If your device  goes off TWICE, call 911. - If your device goes off THREE times in one day, call 911.  DO NOT DRIVE YOURSELF OR A FAMILY MEMBER WITH A DEFIBRILLATOR TO THE HOSPITAL--CALL 911.    Third-Degree Atrioventricular Block What is atrioventricular (AV) block?  Atrioventricular (AV) block, also known as heart block, is a problem with the system that controls how often the heart beats (heart rate)and the pattern of heart beats (heart rhythm). In this condition, the signals that travel from the hearts upper chambers (atria) to its lower chambers (ventricles) move too slowly or are interrupted.There are several types of heart block:  First-degree.  Second-degree.  Third-degree or complete. What is third-degree AV block?  Third-degree AV block, also called complete block, is the most serious type of heart block. In this condition, the signals that control heart rate are completely blocked. As a result, certain cells in the heart cause the ventricles to contract and pump blood (escape beats). These cells act as a sort of backup system. However, this backup system works at a much slower rate than normal,  and it is not enough to keep your heart working well. What are the causes? This condition may be caused by:  Any condition that damages the system that controls the hearts rate and rhythm, such as a heart attack.  Overstimulation of the nerve that slows down heart rate (vagus nerve). This cause is common among well-conditioned athletes.  Some medicinesthat slow down the heart rate, such as beta blockers or calcium channel blockers.  Surgery that damages the heart. Some people are born with this condition (congenital heart block),but most people develop it over time. What increases the risk? The risk for this condition increases with age. You are also more likely to develop this condition if you have:  A history of heart attack.  Heart failure.  Coronary heart disease.  Inflammation of  heart muscle (myocarditis).  Disease of heart muscle (cardiomyopathy).  Infection of the heart valves (endocarditis).  Infections or diseases that affect the heart. These include:  Lyme disease.  Sarcoidosis.  Hemochromatosis.  Rheumatic fever.  Muscle disorders including Lev disease and Lenegre disease. Babies are more likely to be born with heart block if:  The mother has an autoimmune disease, such as lupus.  The baby is born with a heart defect that affects the hearts structure.  A parent was born with a heart defect. What are the signs or symptoms? Symptoms of this condition include:  Tiredness.  Shortness of breath.  Dizziness.  Light-headedness.  Fainting.  Chest pain. How is this diagnosed? This condition may be diagnosed based on:  A physical exam.  Your medical history.  A measurement of your pulse or heartbeat.  An electrocardiogram (ECG). This test is done to check for problems with electrical activity in the heart.  An electrophysiology (EP) study. This test involves having long, thin tubes (catheters) placed in the heart. The catheters are used to study the heart and record electrical signals in the heart. How is this treated? This condition must be treated right away at a hospital right. Treatment may involve:  Treating an underlying condition, such as heart disease.  Changing or stopping any heart medicines that can cause heart block.  Having a permanent pacemaker placed in the chest. A pacemaker is a small device that uses electrical pulses to help the heart beat normally. It is usually placed under the skin on the chest or abdomen. Follow these instructions at home: General instructions  Take over-the-counter and prescription medicines only as told by your health care provider.  Work with your health care provider to control lifestyle choices that increase your risk for heart disease. You may need to:  Get regular exercise. Each  week, try to get 150 minutes of moderate-intensity activity (such as walking or yoga) or 75 minutes of vigorous activity (such as running or swimming). Ask your health care provider what type of exercise is safe for you.  Eat a heart-healthy diet with fruits and vegetables, whole grains, low-fat dairy products, and lean proteins like poultry and eggs. Your health care provider or diet and nutrition specialist (dietitian) can help you make healthy choices.  Maintain a healthy weight.  Limit alcohol intake to no more than 1 drink per day for nonpregnant women and 2 drinks per day for men. One drink equals 12 oz of beer, 5 oz of wine, or 1 oz of hard liquor.  Do not use any products that contain nicotine or tobacco, such as cigarettes and e-cigarettes. If you need help quitting, ask your health care provider.  Keep all follow-up visits as told by your health care provider. This is important. If you have a pacemaker:  Avoid spending long periods of time with electronic devices that have strong magnetic fields, such as cell phones, microwaves, or metal detectors.  Tell all health care providers who care for you that you have a pacemaker.  If you are flying, tell airport security that you have a pacemaker.  Consider wearing a medical ID bracelet or necklace stating that you have a pacemaker.  You will be given a card with information about your pacemaker. Carry this card with you at all times. Contact a health care provider if:  You feel like your heart is skipping beats.  You feel more tired than normal.  You have swelling in your lower legs or your feet. Get help right away if:  Your symptoms change or they get worse.  You develop new symptoms.  You have chest pain, especially if the pain:  Feels like crushing or pressure.  Spreads to your arms, back, neck, or jaw.  You feel short of breath.  You feel light-headed or weak.  You faint. Summary  Third-degree AV block, also  called complete block, is the most serious type of heart block. In this condition, the signals that control heart rate are completely blocked.  A pacemaker is a small device that uses electrical pulses to help the heart beat normally. It is usually placed under the skin on your chest or abdomen.  Third-degree heart block is a medical emergency, and it should be treated right away at a hospital. This information is not intended to replace advice given to you by your health care provider. Make sure you discuss any questions you have with your health care provider. Document Released: 08/26/2008 Document Revised: 04/30/2016 Document Reviewed: 04/30/2016 Elsevier Interactive Patient Education  2017 [employer].

## 2016-09-30 NOTE — Plan of Care (Signed)
Problem: Cardiac: Goal: Ability to achieve and maintain adequate cardiopulmonary perfusion will improve Outcome: Completed/Met Date Met: 09/30/16 Reviewed arm limitations with patient and daughter, they stated their understanding.  Problem: Education: Goal: Knowledge of cardiac device and self-care will improve Outcome: Completed/Met Date Met: 09/30/16 Reviewed discharge instructions with patient and daughter and they stated their understanding.  Discharged home with daughter via wheelchair.

## 2016-09-30 NOTE — Progress Notes (Signed)
Patient ID: Christine Gay, female   DOB: 1940/10/21, 76 y.o.   MRN: [id number]   SUBJECTIVE: Patient admitted with CHB with RBBB escape in 40s.  She had continuous chest pain and troponin increased to 4.94.  Therefore, she was taken to cath lab, showing no change in coronary anatomy.    Echo (09/29/16): EF 50-55%, basal anterolateral hypokinesis, basal inferolateral akinesis.   LHC (09/28/16): Patent LIMA-LAD and SVG-OM1.  Known SVG-OM2 occlusion.  Known occluded nondominant RCA.  Patent proximal-mid LCx stent.   St Jude dual chamber PPM placed 09/29/16.   This morning, she walked in the hall with walker, did well with no dyspnea.  Since then, has developed some "queasiness" in her stomach.   Scheduled Meds: . aspirin EC  81 mg Oral Daily  . carvedilol  3.125 mg Oral BID WC  .  ceFAZolin (ANCEF) IV  1 g Intravenous Q8H  . cycloSPORINE  1 drop Both Eyes BID  . ezetimibe  10 mg Oral Daily  . ferrous sulfate  325 mg Oral Q breakfast  . furosemide  40 mg Oral BID  . isosorbide mononitrate  60 mg Oral BID  . levothyroxine  100 mcg Oral QAC breakfast  . pantoprazole  40 mg Oral Daily  . phenytoin  100 mg Oral QHS  . phenytoin  200 mg Oral Daily  . potassium chloride  10 mEq Oral Daily  . potassium chloride SA  20 mEq Oral Daily  . rosuvastatin  40 mg Oral q1800  . sodium chloride flush  3 mL Intravenous Q12H  . sodium chloride flush  3 mL Intravenous Q12H  . timolol  1 drop Both Eyes Daily   Continuous Infusions: . sodium chloride    . nitroGLYCERIN Stopped (09/28/16 1853)   PRN Meds:.sodium chloride, sodium chloride, acetaminophen, nitroGLYCERIN, ondansetron (ZOFRAN) IV, sodium chloride flush, sodium chloride flush    Vitals:   09/29/16 2018 09/29/16 2142 09/30/16 0443 09/30/16 0942  BP:   129/85 (!) 168/87  Pulse:   80   Resp: 20  18 19   Temp:  98 F (36.7 C) 99.2 F (37.3 C)   TempSrc:  Oral Oral   SpO2:   100%   Weight:   171 lb 9.6 oz (77.8 kg)   Height:         Intake/Output Summary (Last 24 hours) at 09/30/16 1111 Last data filed at 09/30/16 0834  Gross per 24 hour  Intake              620 ml  Output             1350 ml  Net             -730 ml    LABS: Basic Metabolic Panel:  Recent Labs  09/28/16 1256  09/29/16 0823 09/30/16 0306  NA 139  < > 142 138  K 4.0  < > 3.5 4.0  CL 106  < > 109 104  CO2 25  < > 26 27  GLUCOSE 109*  < > 102* 108*  BUN 9  < > 10 13  CREATININE 1.30*  < > 0.92 1.09*  CALCIUM 8.3*  < > 8.2* 8.3*  MG 2.0  --   --   --   < > = values in this interval not displayed. Liver Function Tests: No results for input(s): AST, ALT, ALKPHOS, BILITOT, PROT, ALBUMIN in the last 72 hours. No results for input(s): LIPASE, AMYLASE in the last 72 hours.  CBC:  Recent Labs  09/29/16 0321 09/30/16 0306  WBC 6.7 7.2  HGB 11.8* 12.3  HCT 34.6* 35.9*  MCV 94.8 94.0  PLT 178 197   Cardiac Enzymes:  Recent Labs  09/28/16 1531 09/28/16 2103 09/29/16 0321  TROPONINI 0.09* 2.81* 4.94*   BNP: Invalid input(s): POCBNP D-Dimer: No results for input(s): DDIMER in the last 72 hours. Hemoglobin A1C: No results for input(s): HGBA1C in the last 72 hours. Fasting Lipid Panel: No results for input(s): CHOL, HDL, LDLCALC, TRIG, CHOLHDL, LDLDIRECT in the last 72 hours. Thyroid Function Tests:  Recent Labs  09/28/16 1531  TSH 2.202   Anemia Panel: No results for input(s): VITAMINB12, FOLATE, FERRITIN, TIBC, IRON, RETICCTPCT in the last 72 hours.  RADIOLOGY: Dg Chest 2 View  Result Date: 09/30/2016 CLINICAL DATA:  Weakness. No current chest complaints. History of CHF and previous MI with pacemaker placement. EXAM: CHEST  2 VIEW COMPARISON:  Portable chest x-ray of September 28, 2016 FINDINGS: The lungs are adequately inflated. There is no focal infiltrate. The interstitial markings are less prominent. The cardiac silhouette remains enlarged. There is tortuosity of the ascending and descending thoracic aorta which is  stable. The sternal wires are intact. The ICD is in stable position. The bony thorax exhibits no acute abnormality. IMPRESSION: Further interval decrease in pulmonary interstitial edema. Persistent cardiomegaly. No pneumonia. Electronically Signed   By: David  Martinique M.D.   On: 09/30/2016 06:45   Dg Chest Port 1 View  Result Date: 09/28/2016 CLINICAL DATA:  Chest pain for 1 day EXAM: PORTABLE CHEST 1 VIEW COMPARISON:  April 09, 2015 FINDINGS: There is no edema or consolidation. Heart is mildly enlarged with pulmonary vascularity within normal limits. No adenopathy. Patient is status post median sternotomy. No bone lesions. IMPRESSION: Stable cardiomegaly.  No edema or consolidation. Electronically Signed   By: Lowella Grip III M.D.   On: 09/28/2016 13:45    PHYSICAL EXAM General: NAD Neck: JVP 7 cm, no thyromegaly or thyroid nodule.  Lungs: Clear to auscultation bilaterally with normal respiratory effort. CV: Nondisplaced PMI.  Heart regular S1/S2, no S3/S4, no murmur.  No edema.   Abdomen: Soft, nontender, no hepatosplenomegaly, no distention.  Neurologic: Alert and oriented x 3.  Psych: Normal affect. Extremities: No clubbing or cyanosis.   TELEMETRY: V-paced  ASSESSMENT AND PLAN: 76 yo with history of CAD s/p CABG and diastolic CHF presented with complete heart block.  Patient has had increased fatigue and increased exertional dyspnea for several weeks now.  Over the last 6 months, she has had unexplained falls => without loss of consciousness.  In am of 09/28/16, she developed left-sided chest pain, NTG has helped.  She was noted to be in CHB in the ER with HR in 40s and RBBB escape.  1. Complete heart block: HR in 40s with RBBB escape initially.  Still in CHB off Coreg.  Cardiac cath showed no change to coronary anatomy.  Suspect idiopathic conduction system degeneration.  Echo showed EF 50-55%.  St Jude dual chamber PPM placed on 09/29/16.   2. CAD: h/o CABG.  Last intervention was PCI to  dominant LCx in 2014.  She was admitted 1/2 in the pm with chest pain that was ongoing, troponin up to 4.94.  Given ongoing CP and elevated troponin in setting of CHB, coronary angiography was done.  This showed no change to her coronary anatomy from prior cath.  - Continue ASA, statin, Imdur.    - Coreg restarted after PPM  placed.   3. Chronic diastolic CHF: Volume status looks ok now.  Continue home Lasix.  4. Type II diabetes: She can restart metformin when she goes home.  5. Disposition: Think she can go home this afternoon. A little "queasy" but feeling better with [place].  She will continue the same meds she was on prior to admission.  See EP as scheduled.  See me in CHF clinic in 3-4 weeks.  [name] 09/30/2016 11:11 AM

## 2016-09-30 NOTE — Discharge Summary (Signed)
Advanced Heart Failure Discharge Note  Discharge Summary   Patient ID: Christine Gay MRN: [id number], DOB/AGE: Sep 06, 1941 76 y.o. Admit date: 09/28/2016 D/C date:     09/30/2016   Primary Discharge Diagnoses:  1. New CHB now s/p St Jude PPM 09/29/2016 2. Chest pain 3. CAD s/p CABG 4. Chronic diastolic CHF 5. DMII  Hospital Course:   Christine Gay is a 76 y.o. female with PMH of CAD s/p CABG, DM, and HTN who presented to [facility] with CP and was found to have new CHB with RBB escape in 60s by EKG. Troponin initially negative but climbed to peak of 4.94, so patient taken urgently for cath that evening with on going CP.  LHC (Report below) with no change in coronary anatomy CP, thought most likely CP was due to CHB, as it improved once her rates improved after having coreg held.   LHC (09/28/16): Patent LIMA-LAD and SVG-OM1.  Known SVG-OM2 occlusion.  Known occluded nondominant RCA.  Patent proximal-mid LCx stent.   Echo 09/29/16 LVEF 50-55%  Pt underwent St Jude PPM placement [id number] without complication.  Pt did well after surgery with no further CP or dyspnea.  Did have mild "queasiness" on am of discharge, but otherwise stable.   Pt was assessed on am of 09/30/16 and thought to be stable for discharge with close EP follow up as below. She was started back on low dose coreg with pacing. She will be discharged to home in stable condition.   Discharge Weight Range: 171 lbs Discharge Vitals: Blood pressure (!) 168/87, pulse 80, temperature 99.2 F (37.3 C), temperature source Oral, resp. rate 19, height 5' (1.524 m), weight 171 lb 9.6 oz (77.8 kg), SpO2 100 %.  Labs: Lab Results  Component Value Date   WBC 7.2 09/30/2016   HGB 12.3 09/30/2016   HCT 35.9 (L) 09/30/2016   MCV 94.0 09/30/2016   PLT 197 09/30/2016     Recent Labs Lab 09/30/16 0306  NA 138  K 4.0  CL 104  CO2 27  BUN 13  CREATININE 1.09*  CALCIUM 8.3*  GLUCOSE 108*   Lab Results  Component Value Date   CHOL 145  05/18/2016   HDL 50 05/18/2016   LDLCALC 83 05/18/2016   TRIG 62 05/18/2016   BNP (last 3 results)  Recent Labs  05/18/16 1147 09/28/16 1256  BNP 207.9* 380.3*    ProBNP (last 3 results) No results for input(s): PROBNP in the last 8760 hours.   Diagnostic Studies/Procedures   Dg Chest 2 View  Result Date: 09/30/2016 CLINICAL DATA:  Weakness. No current chest complaints. History of CHF and previous MI with pacemaker placement. EXAM: CHEST  2 VIEW COMPARISON:  Portable chest x-ray of September 28, 2016 FINDINGS: The lungs are adequately inflated. There is no focal infiltrate. The interstitial markings are less prominent. The cardiac silhouette remains enlarged. There is tortuosity of the ascending and descending thoracic aorta which is stable. The sternal wires are intact. The ICD is in stable position. The bony thorax exhibits no acute abnormality. IMPRESSION: Further interval decrease in pulmonary interstitial edema. Persistent cardiomegaly. No pneumonia. Electronically Signed   By: David  Martinique M.D.   On: 09/30/2016 06:45   Dg Chest Port 1 View  Result Date: 09/28/2016 CLINICAL DATA:  Chest pain for 1 day EXAM: PORTABLE CHEST 1 VIEW COMPARISON:  April 09, 2015 FINDINGS: There is no edema or consolidation. Heart is mildly enlarged with pulmonary vascularity within normal limits. No adenopathy. Patient  is status post median sternotomy. No bone lesions. IMPRESSION: Stable cardiomegaly.  No edema or consolidation. Electronically Signed   By: Lowella Grip III M.D.   On: 09/28/2016 13:45    Discharge Medications   Allergies as of 09/30/2016      Reactions   Meperidine Hcl Other (See Comments)   Demerol - mental status changes    Nitroglycerin Other (See Comments)   Patch and Cream only - blisters   Morphine Palpitations      Medication List    TAKE these medications   acetaminophen 325 MG tablet Commonly known as:  TYLENOL Take 1-2 tablets (325-650 mg total) by mouth every 4  (four) hours as needed for mild pain.   aspirin 81 MG tablet Take 81 mg by mouth daily.   carvedilol 6.25 MG tablet Commonly known as:  COREG Take 0.5 tablets (3.125 mg total) by mouth 2 (two) times daily.   cycloSPORINE 0.05 % ophthalmic emulsion Commonly known as:  RESTASIS Place 1 drop into both eyes 2 (two) times daily.   ezetimibe 10 MG tablet Commonly known as:  ZETIA TAKE 1 TABLET BY MOUTH DAILY.   ferrous sulfate 325 (65 FE) MG tablet Take 1 tablet (325 mg total) by mouth 2 (two) times daily with a meal. What changed:  when to take this   furosemide 40 MG tablet Commonly known as:  LASIX TAKE 1 TABLET BY MOUTH 2 TIMES DAILY.   isosorbide mononitrate 60 MG 24 hr tablet Commonly known as:  IMDUR TAKE 2 TABLETS BY MOUTH 2 TIMES A DAY.   levothyroxine 100 MCG tablet Commonly known as:  SYNTHROID, LEVOTHROID Take 100 mcg by mouth daily before breakfast.   metFORMIN 500 MG 24 hr tablet Commonly known as:  GLUCOPHAGE-XR Take 500 mg by mouth 2 (two) times daily. 500 mg by mouth twice daily   NITROSTAT 0.4 MG SL tablet Generic drug:  nitroGLYCERIN PLACE 1 TABLET (0.4 MG TOTAL) UNDER THE TONGUE EVERY 5 (FIVE) MINUTES AS NEEDED FOR CHEST PAIN.   pantoprazole 40 MG tablet Commonly known as:  PROTONIX TAKE 1 TABLET BY MOUTH DAILY.   phenytoin 100 MG ER capsule Commonly known as:  DILANTIN Take 100-200 mg by mouth 2 (two) times daily. Takes 2 caps in am and 1 cap in pm   potassium chloride SA 20 MEQ tablet Commonly known as:  K-DUR,KLOR-CON Take 1 tablet every morning and 1/2 tablet every evening.   rosuvastatin 40 MG tablet Commonly known as:  CRESTOR TAKE 1 TABLET BY MOUTH DAILY.   timolol 0.5 % ophthalmic solution Commonly known as:  TIMOPTIC Place 1 drop into both eyes daily.       Disposition   The patient will be discharged in stable condition to home. Discharge Instructions    (HEART FAILURE PATIENTS) Call MD:  Anytime you have any of the following  symptoms: 1) 3 pound weight gain in 24 hours or 5 pounds in 1 week 2) shortness of breath, with or without a dry hacking cough 3) swelling in the hands, feet or stomach 4) if you have to sleep on extra pillows at night in order to breathe.    Complete by:  As directed    Diet - low sodium heart healthy    Complete by:  As directed    Heart Failure patients record your daily weight using the same scale at the same time of day    Complete by:  As directed    Increase activity slowly  Complete by:  As directed      Follow-up Information    Will Meredith Leeds, MD Follow up on 10/07/2016.   Specialty:  Cardiology Why:  11:00AM, wound check/nursing visit only Contact information: [address] [place] [address] [phone]        [name], MD Follow up on 10/29/2016.   Specialty:  Cardiology Why:  at 1040 for post hospital HF follow up. Please bring all of your medications to your visit. The code for parking is 5001. Contact information: [place] [address] [facility].   Why:  Registered Nurse.  Contact information: [address] [place] [address] [phone]             Duration of Discharge Encounter: Greater than 35 minutes   Signed, Shirley Friar, PA-C 09/30/2016, 12:14 PM

## 2016-09-30 NOTE — Progress Notes (Signed)
SUBJECTIVE: The patient is doing well today.  At this time, she denies chest pain, shortness of breath, or any new concerns.  Marland Kitchen aspirin EC  81 mg Oral Daily  .  ceFAZolin (ANCEF) IV  1 g Intravenous Q8H  . cycloSPORINE  1 drop Both Eyes BID  . ezetimibe  10 mg Oral Daily  . ferrous sulfate  325 mg Oral Q breakfast  . furosemide  40 mg Oral BID  . isosorbide mononitrate  60 mg Oral BID  . levothyroxine  100 mcg Oral QAC breakfast  . pantoprazole  40 mg Oral Daily  . phenytoin  100 mg Oral QHS  . phenytoin  200 mg Oral Daily  . potassium chloride  10 mEq Oral Daily  . potassium chloride SA  20 mEq Oral Daily  . rosuvastatin  40 mg Oral q1800  . sodium chloride flush  3 mL Intravenous Q12H  . sodium chloride flush  3 mL Intravenous Q12H  . timolol  1 drop Both Eyes Daily   . sodium chloride    . nitroGLYCERIN Stopped (09/28/16 1853)    OBJECTIVE: Physical Exam: Vitals:   09/29/16 1551 09/29/16 2018 09/29/16 2142 09/30/16 0443  BP:    129/85  Pulse: 60   80  Resp: 11 20  18   Temp:   98 F (36.7 C) 99.2 F (37.3 C)  TempSrc:   Oral Oral  SpO2: 100%   100%  Weight:    171 lb 9.6 oz (77.8 kg)  Height:        Intake/Output Summary (Last 24 hours) at 09/30/16 0825 Last data filed at 09/29/16 2247  Gross per 24 hour  Intake              200 ml  Output             1350 ml  Net            -1150 ml    Telemetry reveals AV paced rhythm  GEN- The patient is well appearing, alert and oriented x 3 today.   Head- normocephalic, atraumatic Eyes-  Sclera clear, conjunctiva pink Ears- hearing intact Oropharynx- clear Neck- supple, no JVP Lungs- Clear to ausculation bilaterally, normal work of breathing Heart- Regular rate and rhythm, no significant murmurs, no rubs or gallops GI- soft, NT, ND Extremities- no clubbing, cyanosis, or edema Skin- no rash or lesion Psych- euthymic mood, full affect Neuro- no gross deficits appreciated  PPM implant site is dry, stable, no  hematoma  LABS: Basic Metabolic Panel:  Recent Labs  09/28/16 1256  09/29/16 0823 09/30/16 0306  NA 139  < > 142 138  K 4.0  < > 3.5 4.0  CL 106  < > 109 104  CO2 25  < > 26 27  GLUCOSE 109*  < > 102* 108*  BUN 9  < > 10 13  CREATININE 1.30*  < > 0.92 1.09*  CALCIUM 8.3*  < > 8.2* 8.3*  MG 2.0  --   --   --   < > = values in this interval not displayed. CBC:  Recent Labs  09/29/16 0321 09/30/16 0306  WBC 6.7 7.2  HGB 11.8* 12.3  HCT 34.6* 35.9*  MCV 94.8 94.0  PLT 178 197   Cardiac Enzymes:  Recent Labs  09/28/16 1531 09/28/16 2103 09/29/16 0321  TROPONINI 0.09* 2.81* 4.94*   Thyroid Function Tests:  Recent Labs  09/28/16 1531  TSH 2.202   RADIOLOGY:  Dg Chest 2 View Result Date: 09/30/2016 CLINICAL DATA:  Weakness. No current chest complaints. History of CHF and previous MI with pacemaker placement. EXAM: CHEST  2 VIEW COMPARISON:  Portable chest x-ray of September 28, 2016 FINDINGS: The lungs are adequately inflated. There is no focal infiltrate. The interstitial markings are less prominent. The cardiac silhouette remains enlarged. There is tortuosity of the ascending and descending thoracic aorta which is stable. The sternal wires are intact. The ICD is in stable position. The bony thorax exhibits no acute abnormality. IMPRESSION: Further interval decrease in pulmonary interstitial edema. Persistent cardiomegaly. No pneumonia. Electronically Signed   By: David  Martinique M.D.   On: 09/30/2016 06:45     ASSESSMENT AND PLAN:   1. CP Hx of CAD     Troponin elevation > cathed with stable CAD, no intervention, LVEF 45-50% by LV gram, 50-55% by echo, + WMA      Likely demand ischemia/bradycardia  2. CHB with RBBB rates 40's Baseline is LBBB s/p PPM yesterday with Dr. Curt Bears     Wound is stable     Device check this morning with normal/intact function     CXR this morning without pneumothorax     Wound care and activity restrictions discussed with  the patient     Routine post pacer f/u arranged  3. HTN BP ok. Resumed coreg today  4. Falls Unclear mechanism by her description is positive she is not fainting   5. CHF exam is negative, no c/o SOB, cxr as above    Tommye Standard, PA-C 09/30/2016 8:25 AM  I have seen and examined this patient with Tommye Standard.  Agree with above, note added to reflect my findings.  On exam, regular rhythm, no murmurs, lungs clear. Dual chamber pacemaker placed for complete AV block. No complications, CXR and interrogation without issues. Plan for follow up in device clinic in 10 days.    Landa Mullinax M. Darris Staiger MD 09/30/2016 11:00 AM

## 2016-10-01 ENCOUNTER — Other Ambulatory Visit: Payer: Self-pay | Admitting: *Deleted

## 2016-10-01 ENCOUNTER — Encounter: Payer: Self-pay | Admitting: *Deleted

## 2016-10-01 DIAGNOSIS — E0800 Diabetes mellitus due to underlying condition with hyperosmolarity without nonketotic hyperglycemic-hyperosmolar coma (NKHHC): Secondary | ICD-10-CM

## 2016-10-01 DIAGNOSIS — I5030 Unspecified diastolic (congestive) heart failure: Secondary | ICD-10-CM

## 2016-10-01 NOTE — Patient Outreach (Signed)
[facility]) Care Management  10/01/2016  Florella Oland [place] 1941/01/04 [id number]    [place] referred to community nurse for transition of care. Patient was admitted from ED on 09/28/2016 for CHF and pacemaker was inserted. Patient d/cd on 09/30/2016.  Plan: Referral to Community Case Management for Transition of Care Send physician discipline closure letter  [place] Management [phone]

## 2016-10-04 ENCOUNTER — Other Ambulatory Visit: Payer: Self-pay | Admitting: Cardiology

## 2016-10-04 ENCOUNTER — Other Ambulatory Visit: Payer: Self-pay | Admitting: *Deleted

## 2016-10-04 NOTE — Patient Outreach (Signed)
[facility] [facility]) Care Management  10/04/2016  Christine Gay 1940/10/05 [id number]  Transition of care d/c 1/4 referred on 1/5 RN attempted the initial outreach call to pt who was recently discharged. RN introduced the [facility] services and benefits of the available programs. RN inquired if this was a convenient time to talk  And review her discharge sheet. Pt states she does not have her discharges/sheet nearby and requested RN to speak with her daughter. RN spoke briefly with the pt's daughter who indicated they were awaiting for [place] to call to arrange there services. RN explained the difference between the [facility] agency and [facility] case management services and offered to assist further. Daughter feels [facility] is not needed at this time indicating she was a medication tech and aware of her mother's medications with a clear understanding. RN offered to review to make sure pt has all discharge medications however this offer was declined. RN also offered to discussed more on pt's monitoring of her daily weights due to her HF and fairly new pacer however daughter states pt getting ready to eat and this was really not a convenient time. RN offered to follow up tomorrow however daughter states pt needed her rest and requested a call back on Friday afternoon. RN strongly encouraged daughter to follow up with [place] the referred agency on the pending home visit. RN will follow up on Friday as requested for possible ongoing [facility] services.  [name], RN Care Management Coordinator [place] Office [phone]

## 2016-10-07 ENCOUNTER — Ambulatory Visit (INDEPENDENT_AMBULATORY_CARE_PROVIDER_SITE_OTHER): Payer: Medicare Other | Admitting: *Deleted

## 2016-10-07 DIAGNOSIS — I442 Atrioventricular block, complete: Secondary | ICD-10-CM

## 2016-10-07 LAB — CUP PACEART INCLINIC DEVICE CHECK
Implantable Lead Implant Date: 20180103
Implantable Lead Location: 753859
Implantable Pulse Generator Implant Date: 20180103
Lead Channel Impedance Value: 450 Ohm
Lead Channel Impedance Value: 575 Ohm
Lead Channel Pacing Threshold Amplitude: 0.75 V
Lead Channel Pacing Threshold Pulse Width: 0.5 ms
Lead Channel Pacing Threshold Pulse Width: 0.5 ms
Lead Channel Sensing Intrinsic Amplitude: 11.4 mV
Lead Channel Sensing Intrinsic Amplitude: 4.8 mV
Lead Channel Setting Pacing Amplitude: 3.5 V
Lead Channel Setting Pacing Amplitude: 3.5 V
Lead Channel Setting Sensing Sensitivity: 2 mV
MDC IDC LEAD IMPLANT DT: 20180103
MDC IDC LEAD LOCATION: 753860
MDC IDC MSMT BATTERY VOLTAGE: 3.08 V
MDC IDC MSMT LEADCHNL RA PACING THRESHOLD AMPLITUDE: 0.5 V
MDC IDC MSMT LEADCHNL RA PACING THRESHOLD AMPLITUDE: 0.5 V
MDC IDC MSMT LEADCHNL RA PACING THRESHOLD PULSEWIDTH: 0.5 ms
MDC IDC MSMT LEADCHNL RV PACING THRESHOLD AMPLITUDE: 0.75 V
MDC IDC MSMT LEADCHNL RV PACING THRESHOLD PULSEWIDTH: 0.5 ms
MDC IDC PG SERIAL: 7988263
MDC IDC SESS DTM: 20180111115647
MDC IDC SET LEADCHNL RV PACING PULSEWIDTH: 0.5 ms
MDC IDC STAT BRADY RA PERCENT PACED: 29 %
MDC IDC STAT BRADY RV PERCENT PACED: 98 %

## 2016-10-07 NOTE — Progress Notes (Signed)
Wound check appointment. Steri-strips removed. Wound without redness or edema. Incision edges approximated, wound well healed. Normal device function. Thresholds, sensing, and impedances consistent with implant measurements. Device programmed at 3.5V for extra safety margin until 3 month visit. Histogram distribution appropriate for patient and level of activity. No mode switches or high ventricular rates noted. Patient educated about wound care, arm mobility, lifting restrictions, and remote monitoring. ROV in 3 months with WC.

## 2016-10-08 ENCOUNTER — Other Ambulatory Visit: Payer: Self-pay | Admitting: *Deleted

## 2016-10-08 NOTE — Patient Outreach (Signed)
[place] [facility]) Care Management  10/08/2016  Christine Gay [birth year] [id number]   Rn attempted outreach call to pt's contact number however pt has requested RN case manager speak wit her daughter who is not available at this time. Pt provided daughter [employer]) contact number however upon calling this number only able to leave a HIPAA approved voice message requesting a call back. Will at that time inquire further on pt's possible needs and pending possible resources and/or [facility] services. Note incomplete transition of care template and unable to verify pt's medication on last call via pt's daughter decline to discuss such information. Will continue to extend [facility] services accordingly.  [name], RN Care Management Coordinator [place] Office [phone]

## 2016-10-11 ENCOUNTER — Encounter: Payer: Self-pay | Admitting: *Deleted

## 2016-10-11 ENCOUNTER — Other Ambulatory Visit: Payer: Self-pay | Admitting: *Deleted

## 2016-10-11 NOTE — Patient Outreach (Signed)
[facility]) Care Management  10/11/2016  Tanaya Gogan [place] July 31, 1941 [id number]   RN third attempt to reach reach primary caregiver for possible [facility] services however no response on today's outreach. RN able to leave a HIPAA voice message requesting a call back. Will send outreach letter and allow time for call back. If no contact will close case and notify pt's primary care provider.  [name], RN Care Management Coordinator [place] Office [phone]

## 2016-10-26 ENCOUNTER — Other Ambulatory Visit: Payer: Self-pay | Admitting: *Deleted

## 2016-10-26 NOTE — Patient Outreach (Signed)
[place] [facility]) Care Management  10/26/2016  Christine Gay Anwar 1940-10-25 [id number]  RN sent outreach letter to pt however no respond has occurred will close this case via [facility] services at this time.  [name], RN Care Management Coordinator [place] Office [phone]

## 2016-10-27 ENCOUNTER — Encounter: Payer: Self-pay | Admitting: *Deleted

## 2016-10-29 ENCOUNTER — Encounter (HOSPITAL_COMMUNITY): Payer: Self-pay

## 2016-11-02 DIAGNOSIS — H0289 Other specified disorders of eyelid: Secondary | ICD-10-CM | POA: Diagnosis not present

## 2016-11-02 DIAGNOSIS — H16223 Keratoconjunctivitis sicca, not specified as Sjogren's, bilateral: Secondary | ICD-10-CM | POA: Diagnosis not present

## 2016-11-02 DIAGNOSIS — H401132 Primary open-angle glaucoma, bilateral, moderate stage: Secondary | ICD-10-CM | POA: Diagnosis not present

## 2016-11-04 ENCOUNTER — Ambulatory Visit: Payer: Medicare Other | Admitting: Podiatry

## 2016-11-10 ENCOUNTER — Ambulatory Visit (INDEPENDENT_AMBULATORY_CARE_PROVIDER_SITE_OTHER): Payer: Medicare Other | Admitting: Podiatry

## 2016-11-10 ENCOUNTER — Encounter: Payer: Self-pay | Admitting: Podiatry

## 2016-11-10 VITALS — Ht 61.0 in | Wt 162.0 lb

## 2016-11-10 DIAGNOSIS — M79676 Pain in unspecified toe(s): Secondary | ICD-10-CM

## 2016-11-10 DIAGNOSIS — M2012 Hallux valgus (acquired), left foot: Secondary | ICD-10-CM

## 2016-11-10 DIAGNOSIS — E1140 Type 2 diabetes mellitus with diabetic neuropathy, unspecified: Secondary | ICD-10-CM | POA: Diagnosis not present

## 2016-11-10 DIAGNOSIS — M2032 Hallux varus (acquired), left foot: Secondary | ICD-10-CM

## 2016-11-10 DIAGNOSIS — Q828 Other specified congenital malformations of skin: Secondary | ICD-10-CM | POA: Diagnosis not present

## 2016-11-10 DIAGNOSIS — B351 Tinea unguium: Secondary | ICD-10-CM | POA: Diagnosis not present

## 2016-11-10 NOTE — Progress Notes (Signed)
Patient ID: Christine Gay, female   DOB: 1941/01/13, 76 y.o.   MRN: [id number] Complaint:  Visit Type: Patient returns to my office for continued preventative foot care services. Complaint: Patient states" my nails have grown long and thick and become painful to walk and wear shoes" Patient has been diagnosed with DM with no foot complications. The patient presents for preventative foot care services. No changes to ROS  Podiatric Exam: Vascular: dorsalis pedis and posterior tibial pulses are palpable bilateral. Capillary return is immediate. Temperature gradient is WNL. Skin turgor WNL  Sensorium: Diminished  Semmes Weinstein monofilament test. Normal tactile sensation bilaterally. Nail Exam: Pt has thick disfigured discolored nails with subungual debris noted bilateral entire nail hallux through fifth toenails Ulcer Exam: There is no evidence of ulcer or pre-ulcerative changes or infection. Orthopedic Exam: Muscle tone and strength are WNL. No limitations in general ROM. No crepitus or effusions noted. Foot type and digits show no abnormalities. Bony prominences are unremarkable. HAV 1st  MPJ B/L.  Hammer toes 2 B/l Skin: No Porokeratosis. No infection or ulcers.  Callus sub 5th B/L  Diagnosis:  Onychomycosis, , Pain in right toe, pain in left toes, Debride porokeratosis B/l  Treatment & Plan Procedures and Treatment: Consent by patient was obtained for treatment procedures. The patient understood the discussion of treatment and procedures well. All questions were answered thoroughly reviewed. Debridement of mycotic and hypertrophic toenails, 1 through 5 bilateral and clearing of subungual debris. No ulceration, no infection noted. .Initiate diabetic shoes for DPN, HAV and hammer toes. Return Visit-Office Procedure: Patient instructed to return to the office for a follow up visit 3 months for continued evaluation and treatment.  Gardiner Barefoot DPM

## 2016-11-11 ENCOUNTER — Other Ambulatory Visit: Payer: Self-pay | Admitting: Cardiology

## 2016-11-18 ENCOUNTER — Other Ambulatory Visit: Payer: Self-pay | Admitting: Cardiology

## 2016-11-19 ENCOUNTER — Ambulatory Visit (HOSPITAL_COMMUNITY)
Admission: RE | Admit: 2016-11-19 | Discharge: 2016-11-19 | Disposition: A | Discharge status: Home / Self Care | Payer: Medicare Other | Source: Ambulatory Visit | Attending: Cardiology | Admitting: Cardiology

## 2016-11-19 VITALS — BP 124/74 | HR 59 | Wt 174.0 lb

## 2016-11-19 DIAGNOSIS — I110 Hypertensive heart disease with heart failure: Secondary | ICD-10-CM | POA: Diagnosis not present

## 2016-11-19 DIAGNOSIS — Z950 Presence of cardiac pacemaker: Secondary | ICD-10-CM | POA: Diagnosis not present

## 2016-11-19 DIAGNOSIS — Z7982 Long term (current) use of aspirin: Secondary | ICD-10-CM | POA: Insufficient documentation

## 2016-11-19 DIAGNOSIS — I5042 Chronic combined systolic (congestive) and diastolic (congestive) heart failure: Secondary | ICD-10-CM

## 2016-11-19 DIAGNOSIS — E119 Type 2 diabetes mellitus without complications: Secondary | ICD-10-CM | POA: Insufficient documentation

## 2016-11-19 DIAGNOSIS — I5032 Chronic diastolic (congestive) heart failure: Secondary | ICD-10-CM | POA: Diagnosis not present

## 2016-11-19 DIAGNOSIS — Z7984 Long term (current) use of oral hypoglycemic drugs: Secondary | ICD-10-CM | POA: Diagnosis not present

## 2016-11-19 DIAGNOSIS — Z79899 Other long term (current) drug therapy: Secondary | ICD-10-CM | POA: Diagnosis not present

## 2016-11-19 DIAGNOSIS — I2510 Atherosclerotic heart disease of native coronary artery without angina pectoris: Secondary | ICD-10-CM | POA: Insufficient documentation

## 2016-11-19 DIAGNOSIS — I442 Atrioventricular block, complete: Secondary | ICD-10-CM | POA: Diagnosis not present

## 2016-11-19 DIAGNOSIS — E785 Hyperlipidemia, unspecified: Secondary | ICD-10-CM | POA: Diagnosis not present

## 2016-11-19 DIAGNOSIS — R0989 Other specified symptoms and signs involving the circulatory and respiratory systems: Secondary | ICD-10-CM | POA: Insufficient documentation

## 2016-11-19 LAB — BRAIN NATRIURETIC PEPTIDE: B NATRIURETIC PEPTIDE 5: 163.4 pg/mL — AB (ref 0.0–100.0)

## 2016-11-19 LAB — BASIC METABOLIC PANEL
Anion gap: 7 (ref 5–15)
BUN: 8 mg/dL (ref 6–20)
CHLORIDE: 107 mmol/L (ref 101–111)
CO2: 26 mmol/L (ref 22–32)
CREATININE: 0.81 mg/dL (ref 0.44–1.00)
Calcium: 7.6 mg/dL — ABNORMAL LOW (ref 8.9–10.3)
GFR calc Af Amer: >60 mL/min (ref >60)
GFR calc non Af Amer: >60 mL/min (ref >60)
GLUCOSE: 93 mg/dL (ref 65–99)
Potassium: 4.2 mmol/L (ref 3.5–5.1)
SODIUM: 140 mmol/L (ref 135–145)

## 2016-11-19 MED ORDER — FUROSEMIDE 40 MG PO TABS: 60.0000 mg | ORAL_TABLET | Freq: Two times a day (BID) | ORAL | 4 refills | Status: DC

## 2016-11-19 MED ORDER — POTASSIUM CHLORIDE CRYS ER 20 MEQ PO TBCR: 20.0000 meq | EXTENDED_RELEASE_TABLET | Freq: Two times a day (BID) | ORAL | 3 refills | Status: DC

## 2016-11-19 NOTE — Patient Instructions (Signed)
INCREASE Lasix to 60 mg (1.5 Tablet) Two times Daily  INCREASE Potassium to 20 mEq (1 Tablet) Two times Daily  Labs in 10 days  Follow up in 6 weeks

## 2016-11-21 NOTE — Progress Notes (Signed)
Patient ID: Christine Gay, female   DOB: [birth year], 76 y.o.   MRN: [id number] PCP: Dr. Delfina Redwood Cardiology: Dr. Aundra Dubin  76 yo with history of CAD s/p CABG, DM, and HTN presents for cardiology followup.  She had a Lexiscan Cardiolite in 2014 that was an intermediate risk study and echo showed basal to mid inferior hypokinesis with EF 55-60%.  I did a cardaic catheterization in 8/14 that showed patent LIMA-LAD, occluded SVG-OM1, and 95% ostial LCx stenosis.  She had PCI to ostial LCx with Promus DES.  Cardiolite in 5/15 showed no ischemia or infarction.  She had an upper GI bleed in 9/15, she is now off Plavix.   In 1/18, she was admitted with severe fatigue and dyspnea and was found to be in complete heart block.  Troponin was up to 4.9.   She had LHC showing patent LIMA-LAD and SVG-OM1, patent LCx stent, and occluded SVG-OM2 (known from past), no evidence for plaque rupture/new disease. Echo showed EF stable at 50-55%.  She had St Jude PPM placed.   Since getting home, she has remained fatigued and short of breath with exertion, though better than before the PPM was placed.  She has peripheral edema.  She is short of breath walking about 100 feet and with other forms of moderate exertion.  Her balance is poor and she uses a rolling walker.  No melena/BRBPR.  No chest pain.   Labs (3/13): K 4.2, creatinine 0.77 Labs (6/14): LDL 80, HDL 69 Labs (8/14): BNP 971 => 420 => 138, creatinine 0.8, K 4.3 Labs (11/14): K 4.1, creatinine 0.8, BNP 88 Labs (3/15): K 3.4, creatinine 0.86, LDL 85 Labs (9/15): K 3.2, creatinine 0.63, hgb 8 Labs (1/16): LDL 96 Labs (2/16): K 4.2, creatinine 0.89 Labs (3/16): LDL 91, HDL 56 Labs (7/16): TSH normal, K 4.7, creatinine 0.9, hgb 11.4, LDL 93 Labs (8/17): LDL 83, HDL 62 Labs (1/18): K 4, creatinine 1.09, hgb 12.3  PMH: 1. GERD 2. Seizure disorder 3. Type II diabetes 4. Chronic LBBB 5. CAD: s/p CABG in 1992, had PCI several years ago.  Echo (2/11) with EF 55%,  moderate LVH, aortic sclerosis.  LHC (8/14) with patent LIMA-LAD, totally occluded SVG-OM1, 95% ostial LCx, totally occluded small nondominant RCA with EF 55%.  Patient had Promus DES to pLCx. Lexiscan Cardiolite (5/15) with EF 57%, mild breast attenuation, no ischemia or infarction.  Lexiscan Cardiolite (2/16) with EF 44%, small partially reversible mid anterior perfusion defect may be due to soft tissue attenuation.   - LHC (1/18): patent LIMA-LAD, totally occluded LAD, patent SVG-OM1, totally occluded OM2 and SVG-OM2, patent stent in LCx, totally occluded nondominant RCA with right->right collaterals.  6. Glaucoma 7. Hypothyroidism 8. HTN 9. Cholecystectomy 10. Diastolic CHF: Echo (123XX123) with EF 55-60%, basal to mid inferior hypokinesis, mild MR and mild AI. Echo (2/16) with EF 55-60%, mild LAE. - Echo (1/18): EF 50-5%, mild LVH, mildly decreased RV systolic function, mild AI.   11. Carotid stenosis: Carotid dopplers (6/15) with 40-59% bilateral ICA stenosis.  - Carotid dopplers (123XX123) with 123456 LICA stenosis.  12. GI bleed 9/15: EGD showed a polypoid gastric lesion that was friable, benign pathology.  13. Complete heart block: St Jude PPM in 1/18.   SH: Widow, lives in [place], does not smoke.   FH: No premature CAD.  Diabetes.   ROS: All systems reviewed and negative except as per HPI.   Current Outpatient Prescriptions  Medication Sig Dispense Refill  . acetaminophen (TYLENOL)  325 MG tablet Take 1-2 tablets (325-650 mg total) by mouth every 4 (four) hours as needed for mild pain.    Marland Kitchen aspirin 81 MG tablet Take 81 mg by mouth daily.    . carvedilol (COREG) 6.25 MG tablet Take 0.5 tablets (3.125 mg total) by mouth 2 (two) times daily.    . cycloSPORINE (RESTASIS) 0.05 % ophthalmic emulsion Place 1 drop into both eyes 2 (two) times daily.    Marland Kitchen ezetimibe (ZETIA) 10 MG tablet TAKE 1 TABLET BY MOUTH DAILY. 90 tablet 3  . ferrous sulfate 325 (65 FE) MG tablet Take 1 tablet (325 mg  total) by mouth 2 (two) times daily with a meal. (Patient taking differently: Take 325 mg by mouth daily with breakfast. ) 90 tablet 0  . furosemide (LASIX) 40 MG tablet Take 1.5 tablets (60 mg total) by mouth 2 (two) times daily. 90 tablet 4  . ibandronate (BONIVA) 150 MG tablet Take 150 mg by mouth every 30 (thirty) days. Take in the morning with a full glass of water, on an empty stomach, and do not take anything else by mouth or lie down for the next 30 min.    . isosorbide mononitrate (IMDUR) 60 MG 24 hr tablet TAKE 2 TABLETS BY MOUTH 2 TIMES A DAY. 966 tablet 3  . levothyroxine (SYNTHROID, LEVOTHROID) 100 MCG tablet Take 100 mcg by mouth daily before breakfast.    . metFORMIN (GLUCOPHAGE-XR) 500 MG 24 hr tablet Take 500 mg by mouth 2 (two) times daily. 500 mg by mouth twice daily    . NITROSTAT 0.4 MG SL tablet PLACE 1 TABLET (0.4 MG TOTAL) UNDER THE TONGUE EVERY 5 (FIVE) MINUTES AS NEEDED FOR CHEST PAIN. 25 tablet 11  . pantoprazole (PROTONIX) 40 MG tablet TAKE 1 TABLET BY MOUTH DAILY. 90 tablet 2  . phenytoin (DILANTIN) 100 MG ER capsule Take 200 mg by mouth 2 (two) times daily.    . potassium chloride SA (K-DUR,KLOR-CON) 20 MEQ tablet Take 1 tablet (20 mEq total) by mouth 2 (two) times daily. 60 tablet 3  . rosuvastatin (CRESTOR) 40 MG tablet TAKE 1 TABLET BY MOUTH DAILY. 90 tablet 2  . timolol (TIMOPTIC) 0.5 % ophthalmic solution Place 1 drop into both eyes 2 (two) times daily.   6   No current facility-administered medications for this encounter.     BP 124/74 (BP Location: Right Arm, Patient Position: Sitting, Cuff Size: Normal)   Pulse (!) 59   Wt 174 lb (78.9 kg)   SpO2 98%   BMI 32.88 kg/m  General: NAD Neck: JVP 8-9 cm, no thyromegaly or thyroid nodule.  Lungs: CTAB  CV: Nondisplaced PMI.  Heart regular S1/S2, no S3/S4, 2/6 early SEM.  1+ edema 1/2 to knees bilaterally.  Right carotid bruit.   Abdomen: Soft, nontender, no hepatosplenomegaly, no distention.  Skin: Intact  without lesions or rashes.  Neurologic: Alert and oriented x 3.  Psych: Normal affect. Extremities: No clubbing or cyanosis.   Assessment/Plan: 1. CAD: Status post Promus DES to ostial LCx in 8/14.  Recent cath in 1/18 showed stable anatomy.  No chest pain.  - Continue Imdur and Coreg at current doses.  - She is not a ranolazine candidate given use of Dilantin.  - Continue ASA 81 and statin.  2. Diastolic CHF: EF 99991111 on 1/18 echo.  She has NYHA class III dyspnea and mild volume overload on exam.  It is possible that RV pacing is triggering some volume retention.   -  Increase Lasix to 60 mg bid and KCl to 20 mEq bid.     - BMET/BNP today and again in 10 days.  3. Hyperlipidemia: She is on Zetia and Crestor.  Good lipids in 8/17.  4. Carotid bruit: Repeat carotid dopplers in 6/18.   5. H/o GI bleeding: CBC has been stable.  6. Complete heart block: S/p St Jude PPM.   Followup in 6 wks.    [name] 11/21/2016

## 2016-11-24 DIAGNOSIS — E1122 Type 2 diabetes mellitus with diabetic chronic kidney disease: Secondary | ICD-10-CM | POA: Diagnosis not present

## 2016-11-24 DIAGNOSIS — M810 Age-related osteoporosis without current pathological fracture: Secondary | ICD-10-CM | POA: Diagnosis not present

## 2016-11-24 DIAGNOSIS — Z7984 Long term (current) use of oral hypoglycemic drugs: Secondary | ICD-10-CM | POA: Diagnosis not present

## 2016-11-24 DIAGNOSIS — E7800 Pure hypercholesterolemia, unspecified: Secondary | ICD-10-CM | POA: Diagnosis not present

## 2016-11-24 DIAGNOSIS — Z0000 Encounter for general adult medical examination without abnormal findings: Secondary | ICD-10-CM | POA: Diagnosis not present

## 2016-11-24 DIAGNOSIS — E039 Hypothyroidism, unspecified: Secondary | ICD-10-CM | POA: Diagnosis not present

## 2016-11-24 DIAGNOSIS — I2510 Atherosclerotic heart disease of native coronary artery without angina pectoris: Secondary | ICD-10-CM | POA: Diagnosis not present

## 2016-11-24 DIAGNOSIS — I10 Essential (primary) hypertension: Secondary | ICD-10-CM | POA: Diagnosis not present

## 2016-11-24 DIAGNOSIS — R569 Unspecified convulsions: Secondary | ICD-10-CM | POA: Diagnosis not present

## 2016-11-29 ENCOUNTER — Other Ambulatory Visit: Payer: Medicare Other | Admitting: *Deleted

## 2016-11-29 ENCOUNTER — Inpatient Hospital Stay (HOSPITAL_COMMUNITY): Admission: RE | Admit: 2016-11-29 | Payer: Self-pay | Source: Ambulatory Visit

## 2016-11-29 DIAGNOSIS — E7800 Pure hypercholesterolemia, unspecified: Secondary | ICD-10-CM

## 2016-11-29 DIAGNOSIS — I10 Essential (primary) hypertension: Secondary | ICD-10-CM

## 2016-11-29 DIAGNOSIS — I5020 Unspecified systolic (congestive) heart failure: Secondary | ICD-10-CM

## 2016-11-29 DIAGNOSIS — I5042 Chronic combined systolic (congestive) and diastolic (congestive) heart failure: Secondary | ICD-10-CM

## 2016-11-29 LAB — BASIC METABOLIC PANEL
BUN / CREAT RATIO: 13 (ref 12–28)
BUN: 12 mg/dL (ref 8–27)
CHLORIDE: 103 mmol/L (ref 96–106)
CO2: 21 mmol/L (ref 18–29)
Calcium: 8.2 mg/dL — ABNORMAL LOW (ref 8.7–10.3)
Creatinine, Ser: 0.96 mg/dL (ref 0.57–1.00)
GFR calc Af Amer: 67 mL/min/{1.73_m2} (ref >59)
GFR calc non Af Amer: 58 mL/min/{1.73_m2} — ABNORMAL LOW (ref >59)
GLUCOSE: 108 mg/dL — AB (ref 65–99)
Potassium: 3.9 mmol/L (ref 3.5–5.2)
SODIUM: 141 mmol/L (ref 134–144)

## 2016-11-29 LAB — PRO B NATRIURETIC PEPTIDE: NT-Pro BNP: 789 pg/mL — ABNORMAL HIGH (ref 0–738)

## 2016-11-29 NOTE — Addendum Note (Signed)
Addended by: Eulis Foster on: 11/29/2016 09:36 AM   Modules accepted: Orders

## 2016-12-17 ENCOUNTER — Other Ambulatory Visit: Payer: Self-pay | Admitting: Cardiology

## 2017-01-05 ENCOUNTER — Encounter (HOSPITAL_COMMUNITY): Payer: Self-pay

## 2017-01-05 ENCOUNTER — Ambulatory Visit (HOSPITAL_COMMUNITY)
Admission: RE | Admit: 2017-01-05 | Discharge: 2017-01-05 | Disposition: A | Discharge status: Home / Self Care | Payer: Medicare Other | Source: Ambulatory Visit | Attending: Cardiology | Admitting: Cardiology

## 2017-01-05 VITALS — BP 119/71 | HR 60 | Wt 176.2 lb

## 2017-01-05 DIAGNOSIS — Z79899 Other long term (current) drug therapy: Secondary | ICD-10-CM | POA: Diagnosis not present

## 2017-01-05 DIAGNOSIS — I2510 Atherosclerotic heart disease of native coronary artery without angina pectoris: Secondary | ICD-10-CM

## 2017-01-05 DIAGNOSIS — I110 Hypertensive heart disease with heart failure: Secondary | ICD-10-CM | POA: Diagnosis not present

## 2017-01-05 DIAGNOSIS — Z950 Presence of cardiac pacemaker: Secondary | ICD-10-CM | POA: Diagnosis not present

## 2017-01-05 DIAGNOSIS — Z7982 Long term (current) use of aspirin: Secondary | ICD-10-CM | POA: Insufficient documentation

## 2017-01-05 DIAGNOSIS — R0989 Other specified symptoms and signs involving the circulatory and respiratory systems: Secondary | ICD-10-CM | POA: Insufficient documentation

## 2017-01-05 DIAGNOSIS — I5032 Chronic diastolic (congestive) heart failure: Secondary | ICD-10-CM

## 2017-01-05 DIAGNOSIS — I6523 Occlusion and stenosis of bilateral carotid arteries: Secondary | ICD-10-CM

## 2017-01-05 DIAGNOSIS — I442 Atrioventricular block, complete: Secondary | ICD-10-CM | POA: Diagnosis not present

## 2017-01-05 DIAGNOSIS — Z7984 Long term (current) use of oral hypoglycemic drugs: Secondary | ICD-10-CM | POA: Insufficient documentation

## 2017-01-05 NOTE — Patient Instructions (Signed)
Your physician has requested that you have a carotid duplex. This test is an ultrasound of the carotid arteries in your neck. It looks at blood flow through these arteries that supply the brain with blood. Allow one hour for this exam. There are no restrictions or special instructions.  IN June  We will contact you in 4 months to schedule your next appointment.

## 2017-01-06 NOTE — Progress Notes (Signed)
Patient ID: Christine Gay, female   DOB: [birth year], 76 y.o.   MRN: [id number] PCP: Dr. Delfina Redwood Cardiology: Dr. Aundra Dubin  76 yo with history of CAD s/p CABG, DM, and HTN presents for cardiology followup.  She had a Lexiscan Cardiolite in 2014 that was an intermediate risk study and echo showed basal to mid inferior hypokinesis with EF 55-60%.  I did a cardaic catheterization in 8/14 that showed patent LIMA-LAD, occluded SVG-OM1, and 95% ostial LCx stenosis.  She had PCI to ostial LCx with Promus DES.  Cardiolite in 5/15 showed no ischemia or infarction.  She had an upper GI bleed in 9/15, she is now off Plavix.   In 1/18, she was admitted with severe fatigue and dyspnea and was found to be in complete heart block.  Troponin was up to 4.9.   She had LHC showing patent LIMA-LAD and SVG-OM1, patent LCx stent, and occluded SVG-OM2 (known from past), no evidence for plaque rupture/new disease. Echo showed EF stable at 50-55%.  She had St Jude PPM placed.   She is doing better since increasing Lasix at last appointment.  Weight is stable.  She is not as fatigued with exertion.  Continues to use a walker.  No lightheadedness.  She had a mechanical fall yesterday, tripped.  No dyspnea walking on flat ground, tries to take a walk outside twice a day.  No chest pain.    Labs (3/13): K 4.2, creatinine 0.77 Labs (6/14): LDL 80, HDL 69 Labs (8/14): BNP 971 => 420 => 138, creatinine 0.8, K 4.3 Labs (11/14): K 4.1, creatinine 0.8, BNP 88 Labs (3/15): K 3.4, creatinine 0.86, LDL 85 Labs (9/15): K 3.2, creatinine 0.63, hgb 8 Labs (1/16): LDL 96 Labs (2/16): K 4.2, creatinine 0.89 Labs (3/16): LDL 91, HDL 56 Labs (7/16): TSH normal, K 4.7, creatinine 0.9, hgb 11.4, LDL 93 Labs (8/17): LDL 83, HDL 62 Labs (1/18): K 4, creatinine 1.09, hgb 12.3 Labs (2/18): K 4.2, creatinine 0.81, BNP 163 Labs (3/18): K 3.9, creatinine 0.96  PMH: 1. GERD 2. Seizure disorder 3. Type II diabetes 4. Chronic LBBB 5. CAD: s/p CABG  in 1992, had PCI several years ago.  Echo (2/11) with EF 55%, moderate LVH, aortic sclerosis.  LHC (8/14) with patent LIMA-LAD, totally occluded SVG-OM1, 95% ostial LCx, totally occluded small nondominant RCA with EF 55%.  Patient had Promus DES to pLCx. Lexiscan Cardiolite (5/15) with EF 57%, mild breast attenuation, no ischemia or infarction.  Lexiscan Cardiolite (2/16) with EF 44%, small partially reversible mid anterior perfusion defect may be due to soft tissue attenuation.   - LHC (1/18): patent LIMA-LAD, totally occluded LAD, patent SVG-OM1, totally occluded OM2 and SVG-OM2, patent stent in LCx, totally occluded nondominant RCA with right->right collaterals.  6. Glaucoma 7. Hypothyroidism 8. HTN 9. Cholecystectomy 10. Diastolic CHF: Echo (3/09) with EF 55-60%, basal to mid inferior hypokinesis, mild MR and mild AI. Echo (2/16) with EF 55-60%, mild LAE. - Echo (1/18): EF 50-5%, mild LVH, mildly decreased RV systolic function, mild AI.   11. Carotid stenosis: Carotid dopplers (6/15) with 40-59% bilateral ICA stenosis.  - Carotid dopplers (4/07) with 68-08% LICA stenosis.  12. GI bleed 9/15: EGD showed a polypoid gastric lesion that was friable, benign pathology.  13. Complete heart block: St Jude PPM in 1/18.   SH: Widow, lives in [place], does not smoke.   FH: No premature CAD.  Diabetes.   ROS: All systems reviewed and negative except as per HPI.  Current Outpatient Prescriptions  Medication Sig Dispense Refill  . acetaminophen (TYLENOL) 325 MG tablet Take 1-2 tablets (325-650 mg total) by mouth every 4 (four) hours as needed for mild pain.    Marland Kitchen aspirin 81 MG tablet Take 81 mg by mouth daily.    . carvedilol (COREG) 6.25 MG tablet Take 0.5 tablets (3.125 mg total) by mouth 2 (two) times daily.    . cycloSPORINE (RESTASIS) 0.05 % ophthalmic emulsion Place 1 drop into both eyes 2 (two) times daily.    Marland Kitchen ezetimibe (ZETIA) 10 MG tablet TAKE 1 TABLET BY MOUTH DAILY. 90 tablet 3  .  ferrous sulfate 325 (65 FE) MG tablet Take 1 tablet (325 mg total) by mouth 2 (two) times daily with a meal. 90 tablet 0  . furosemide (LASIX) 40 MG tablet Take 1.5 tablets (60 mg total) by mouth 2 (two) times daily. 90 tablet 4  . ibandronate (BONIVA) 150 MG tablet Take 150 mg by mouth every 30 (thirty) days. Take in the morning with a full glass of water, on an empty stomach, and do not take anything else by mouth or lie down for the next 30 min.    . isosorbide mononitrate (IMDUR) 60 MG 24 hr tablet TAKE 2 TABLETS BY MOUTH 2 TIMES A DAY. 966 tablet 3  . levothyroxine (SYNTHROID, LEVOTHROID) 100 MCG tablet Take 100 mcg by mouth daily before breakfast.    . metFORMIN (GLUCOPHAGE-XR) 500 MG 24 hr tablet Take 500 mg by mouth 2 (two) times daily. 500 mg by mouth twice daily    . pantoprazole (PROTONIX) 40 MG tablet TAKE 1 TABLET BY MOUTH DAILY. 90 tablet 2  . phenytoin (DILANTIN) 100 MG ER capsule Take 200 mg by mouth 2 (two) times daily.    . potassium chloride SA (K-DUR,KLOR-CON) 20 MEQ tablet Take 1 tablet (20 mEq total) by mouth 2 (two) times daily. 60 tablet 3  . rosuvastatin (CRESTOR) 40 MG tablet TAKE 1 TABLET BY MOUTH DAILY. 90 tablet 2  . timolol (TIMOPTIC) 0.5 % ophthalmic solution Place 1 drop into both eyes 2 (two) times daily.   6  . NITROSTAT 0.4 MG SL tablet PLACE 1 TABLET (0.4 MG TOTAL) UNDER THE TONGUE EVERY 5 (FIVE) MINUTES AS NEEDED FOR CHEST PAIN. (Patient not taking: Reported on 01/05/2017) 25 tablet 11   No current facility-administered medications for this encounter.     BP 119/71   Pulse 60   Wt 176 lb 4 oz (79.9 kg)   SpO2 94%   BMI 33.30 kg/m  General: NAD Neck: JVP 7 cm, no thyromegaly or thyroid nodule.  Lungs: CTAB  CV: Nondisplaced PMI.  Heart regular S1/S2, no S3/S4, 1/6 early SEM.  Trace ankle edema. Right carotid bruit.   Abdomen: Soft, nontender, no hepatosplenomegaly, no distention.  Skin: Intact without lesions or rashes.  Neurologic: Alert and oriented x  3.  Psych: Normal affect. Extremities: No clubbing or cyanosis.   Assessment/Plan: 1. CAD: Status post Promus DES to ostial LCx in 8/14.  Recent cath in 1/18 showed stable anatomy.  No chest pain.  - Continue Imdur and Coreg at current doses.  - She is not a ranolazine candidate given use of Dilantin.  - Continue ASA 81 and statin.  2. Diastolic CHF: EF 37-85% on 1/18 echo.  NYHA class II. She is no volume overloaded on exam.   - Continue Lasix 60 mg bid and KCl 20 mEq bid.     3. Hyperlipidemia: She is on  Zetia and Crestor.  Good lipids in 8/17.  4. Carotid bruit: Repeat carotid dopplers in 6/18.   5. H/o GI bleeding: No melena or hematochezia.  6. Complete heart block: S/p St Jude PPM.   Followup in 4 months.   [name] 01/06/2017

## 2017-01-17 ENCOUNTER — Ambulatory Visit (INDEPENDENT_AMBULATORY_CARE_PROVIDER_SITE_OTHER): Payer: Medicare Other | Admitting: Cardiology

## 2017-01-17 ENCOUNTER — Encounter (INDEPENDENT_AMBULATORY_CARE_PROVIDER_SITE_OTHER): Payer: Self-pay

## 2017-01-17 ENCOUNTER — Encounter: Payer: Self-pay | Admitting: Cardiology

## 2017-01-17 VITALS — BP 120/56 | HR 60 | Ht 60.0 in | Wt 175.6 lb

## 2017-01-17 DIAGNOSIS — I442 Atrioventricular block, complete: Secondary | ICD-10-CM

## 2017-01-17 DIAGNOSIS — I6523 Occlusion and stenosis of bilateral carotid arteries: Secondary | ICD-10-CM

## 2017-01-17 DIAGNOSIS — Z45018 Encounter for adjustment and management of other part of cardiac pacemaker: Secondary | ICD-10-CM | POA: Diagnosis not present

## 2017-01-17 DIAGNOSIS — Z950 Presence of cardiac pacemaker: Secondary | ICD-10-CM

## 2017-01-17 NOTE — Progress Notes (Signed)
Electrophysiology Office Note   Date:  01/17/2017   ID:  Christine Gay, DOB April 29, 1941, MRN [id number]  PCP:  Kandice Hams, MD  Cardiologist:  Aundra Dubin Primary Electrophysiologist:  Will Meredith Leeds, MD    Chief Complaint  Patient presents with  . Pacemaker Check    Complete heart block     History of Present Illness: Christine Gay is a 76 y.o. female who is being seen today for the evaluation of CHB at the request of Seward Carol, MD. Presenting today for electrophysiology evaluation. Past history of diastolic heart failure, coronary disease status post CABG. Presented to the emergency room on 09/28/16 with chest pain at rest resolving after 2 sublingual nitroglycerin. When in the emergency room, she was found to have complete heart block. She had a St. Jude dual chamber pacemaker placed 09/29/16. She's been feeling well without any major issues. On device interrogation, she has had episodes of atrial fibrillation.    Today, she denies symptoms of palpitations, chest pain, shortness of breath, orthopnea, PND, lower extremity edema, claudication, dizziness, presyncope, syncope, bleeding, or neurologic sequela. The patient is tolerating medications without difficulties.    Past Medical History:  Diagnosis Date  . Anemia    a. Noted on labs 04/2013.  . Anginal pain ([place])   . Arthritis    "legs" (06/10/2014)  . CAD (coronary artery disease)    a. s/p CABG 1992, b. s/p PCI several years ago; c. LHC 11/08: L-LAD ok, S-OM1 ok, S-OM2 CTO, prox to mid CFX 70% (unchanged), RCA non-dominant, occluded;  d.  Echo 2/11: EF 55%, mod LVH, Ao sclerosis;  e. Echo 6/14: mild LVH, mild focal basal sept hypertrophy, EF 55-65%, inf HK, Mild AI, mild MR, mild BAE;  f. Myoview 7/14: int risk-> cath 04/2013 s/p PTCA/DES to prox Cx 05/04/13.  . Chronic diastolic CHF (congestive heart failure) ([place])   . GERD (gastroesophageal reflux disease)   . Glaucoma   . High cholesterol   . Hypertension   .  Hypothyroidism   . LBBB (left bundle branch block)    chronic  . Myocardial infarction ([place]) 03/1991 X 2; 07/1991  . Obesity   . Seizures ([place])    "I'm on Dilantin" (06/10/2014)  . Sickle cell trait ([place])   . Type II diabetes mellitus ([place])    Past Surgical History:  Procedure Laterality Date  . CARDIAC CATHETERIZATION     "several;  they went up to check"  . CARDIAC CATHETERIZATION N/A 09/28/2016   Procedure: Left Heart Cath and Coronary Angiography;  Surgeon: Peter M Martinique, MD;  Location: [place] [facility];  Service: Cardiovascular;  Laterality: N/A;  . CATARACT EXTRACTION W/ INTRAOCULAR LENS  IMPLANT, BILATERAL Bilateral   . CHOLECYSTECTOMY    . COLONOSCOPY N/A 06/12/2014   Procedure: COLONOSCOPY;  Surgeon: Cleotis Nipper, MD;  Location[facility] ENDOSCOPY;  Service: Endoscopy;  Laterality: N/A;  . CORONARY ANGIOPLASTY WITH STENT PLACEMENT  ? date; 05/04/2013   ? location; DES  to [place]  . CORONARY ARTERY BYPASS GRAFT  07/1991   "CABG X 3"  . EP IMPLANTABLE DEVICE N/A 09/29/2016   Procedure: Pacemaker Implant;  Surgeon: Will Meredith Leeds, MD;  Location: [place] CV [facility];  Service: Cardiovascular;  Laterality: N/A;  . ESOPHAGOGASTRODUODENOSCOPY N/A 06/12/2014   Procedure: ESOPHAGOGASTRODUODENOSCOPY (EGD);  Surgeon: Cleotis Nipper, MD;  Location: [facility] ENDOSCOPY;  Service: Endoscopy;  Laterality: N/A;  . LEFT AND RIGHT HEART CATHETERIZATION WITH CORONARY ANGIOGRAM N/A 05/04/2013  Procedure: LEFT AND RIGHT HEART CATHETERIZATION WITH CORONARY ANGIOGRAM;  Surgeon: Larey Dresser, MD;  Location: [facility] CATH LAB;  Service: Cardiovascular;  Laterality: N/A;  . PERCUTANEOUS STENT INTERVENTION  05/04/2013   Procedure: PERCUTANEOUS STENT INTERVENTION;  Surgeon: Larey Dresser, MD;  Location: [facility] CATH LAB;  Service: Cardiovascular;;  . TUBAL LIGATION Bilateral 1969     Current Outpatient Prescriptions  Medication Sig Dispense Refill  . acetaminophen (TYLENOL) 325 MG tablet Take 1-2 tablets  (325-650 mg total) by mouth every 4 (four) hours as needed for mild pain.    Marland Kitchen aspirin 81 MG tablet Take 81 mg by mouth daily.    . carvedilol (COREG) 6.25 MG tablet Take 0.5 tablets (3.125 mg total) by mouth 2 (two) times daily.    . cycloSPORINE (RESTASIS) 0.05 % ophthalmic emulsion Place 1 drop into both eyes 2 (two) times daily.    Marland Kitchen ezetimibe (ZETIA) 10 MG tablet TAKE 1 TABLET BY MOUTH DAILY. 90 tablet 3  . ferrous sulfate 325 (65 FE) MG tablet Take 1 tablet (325 mg total) by mouth 2 (two) times daily with a meal. 90 tablet 0  . furosemide (LASIX) 40 MG tablet Take 1.5 tablets (60 mg total) by mouth 2 (two) times daily. 90 tablet 4  . ibandronate (BONIVA) 150 MG tablet Take 150 mg by mouth every 30 (thirty) days. Take in the morning with a full glass of water, on an empty stomach, and do not take anything else by mouth or lie down for the next 30 min.    . isosorbide mononitrate (IMDUR) 60 MG 24 hr tablet TAKE 2 TABLETS BY MOUTH 2 TIMES A DAY. 966 tablet 3  . levothyroxine (SYNTHROID, LEVOTHROID) 100 MCG tablet Take 100 mcg by mouth daily before breakfast.    . metFORMIN (GLUCOPHAGE-XR) 500 MG 24 hr tablet Take 500 mg by mouth 2 (two) times daily. 500 mg by mouth twice daily    . NITROSTAT 0.4 MG SL tablet PLACE 1 TABLET (0.4 MG TOTAL) UNDER THE TONGUE EVERY 5 (FIVE) MINUTES AS NEEDED FOR CHEST PAIN. 25 tablet 11  . pantoprazole (PROTONIX) 40 MG tablet TAKE 1 TABLET BY MOUTH DAILY. 90 tablet 2  . phenytoin (DILANTIN) 100 MG ER capsule Take 200 mg by mouth 2 (two) times daily.    . potassium chloride SA (K-DUR,KLOR-CON) 20 MEQ tablet Take 1 tablet (20 mEq total) by mouth 2 (two) times daily. 60 tablet 3  . rosuvastatin (CRESTOR) 40 MG tablet TAKE 1 TABLET BY MOUTH DAILY. 90 tablet 2  . timolol (TIMOPTIC) 0.5 % ophthalmic solution Place 1 drop into both eyes 2 (two) times daily.   6   No current facility-administered medications for this visit.     Allergies:   Meperidine hcl; Nitroglycerin;  and Morphine   Social History:  The patient  reports that she has never smoked. She has never used smokeless tobacco. She reports that she does not drink alcohol or use drugs.   Family History:  The patient's family history includes Diabetes in her mother.    ROS:  Please see the history of present illness.   Otherwise, review of systems is positive for walking problems.   All other systems are reviewed and negative.    PHYSICAL EXAM: VS:  BP (!) 120/56   Pulse 60   Ht 5' (1.524 m)   Wt 175 lb 9.6 oz (79.7 kg)   BMI 34.29 kg/m  , BMI Body mass index is 34.29 kg/m. GEN: Well  nourished, well developed, in no acute distress  HEENT: normal  Neck: no JVD, carotid bruits, or masses Cardiac: RRR; no murmurs, rubs, or gallops,no edema  Respiratory:  clear to auscultation bilaterally, normal work of breathing GI: soft, nontender, nondistended, + BS MS: no deformity or atrophy  Skin: warm and dry,  device pocket is well healed Neuro:  Strength and sensation are intact Psych: euthymic mood, full affect  EKG:  EKG is ordered today. Personal review of the ekg ordered shows AV paced   Device interrogation is reviewed today in detail.  See PaceArt for details.   Recent Labs: 09/28/2016: Magnesium 2.0; TSH 2.202 09/30/2016: Hemoglobin 12.3; Platelets 197 11/19/2016: B Natriuretic Peptide 163.4 11/29/2016: BUN 12; Creatinine, Ser 0.96; NT-Pro BNP 789; Potassium 3.9; Sodium 141    Lipid Panel     Component Value Date/Time   CHOL 145 05/18/2016 1148   TRIG 62 05/18/2016 1148   HDL 50 05/18/2016 1148   CHOLHDL 2.9 05/18/2016 1148   VLDL 12 05/18/2016 1148   LDLCALC 83 05/18/2016 1148     Wt Readings from Last 3 Encounters:  01/17/17 175 lb 9.6 oz (79.7 kg)  01/05/17 176 lb 4 oz (79.9 kg)  11/19/16 174 lb (78.9 kg)      Other studies Reviewed: Additional studies/ records that were reviewed today include: TTE 09/29/16  Review of the above records today demonstrates:  - Left  ventricle: The cavity size was normal. Wall thickness was   increased in a pattern of mild LVH. Basal anterolateral severe   hypokinesis, basal inferolateral akinesis. Systolic function was   low normal to mildly reduced. The estimated ejection fraction was   in the range of 50% to 55%. Indeterminant diastolic function. - Aortic valve: There was no stenosis. There was mild   regurgitation. - Mitral valve: There was trivial regurgitation. - Left atrium: The atrium was mildly dilated. - Right ventricle: The cavity size was normal. Systolic function   was mildly reduced. - Tricuspid valve: No complete TR doppler jet so unable to estimate   PA systolic pressure. - Pulmonary arteries: PA peak pressure: 33 mm Hg (S). - Inferior vena cava: The vessel was normal in size. The   respirophasic diameter changes were in the normal range (>= 50%),   consistent with normal central venous pressure.  LHC 09/28/16  There is mild left ventricular systolic dysfunction.  LV end diastolic pressure is mildly elevated.  The left ventricular ejection fraction is 45-50% by visual estimate.  Ost RCA to Prox RCA lesion, 100 %stenosed.  LM lesion, 35 %stenosed.  Prox Cx to Mid Cx lesion, 30 %stenosed.  Ost 1st Mrg to 1st Mrg lesion, 100 %stenosed.  Ost 2nd Mrg to 2nd Mrg lesion, 100 %stenosed.  Prox LAD lesion, 100 %stenosed.  LIMA graft was visualized by angiography and is normal in caliber and anatomically normal.  SVG graft was visualized by angiography and is normal in caliber and anatomically normal.  SVG graft was not visualized due to known occlusion.  Origin to Prox Graft lesion, 100 %stenosed.   1. Left dominant circulation 2. Severe 3 vessel occlusive CAD 3. Patent stent in the proximal to mid LAD 4. Patent SVG to OM1 5. Patent LIMA to LAD 6. Known occlusion of SVG to OM2 7. Mild LV dysfunction EF 45-50%. 8. Mildly elevated LVEDP.  ASSESSMENT AND PLAN:  1.  Complete AV block:  Status post St. Jude dual-chamber pacemaker in 09/29/16. No changes made today.  2. Hypertension: Well-controlled  today. Continue Coreg, Lasix  3. Coronary artery disease: Stable disease as per cath on 09/28/16. No chest pain today. Continue current management.  3. CHF: Diastolic heart failure as found on echo on admission in January. Currently not volume overloaded. Continue current management.  5. Paroxysmal atrial fibrillation: Found on device interrogation. Patient is currently asymptomatic. Does have an elevated stroke risk. We'll start her on Eliquis today.  This patients CHA2DS2-VASc Score and unadjusted Ischemic Stroke Rate (% per year) is equal to 7.2 % stroke rate/year from a score of 5  Above score calculated as 1 point each if present [CHF, HTN, DM, Vascular=MI/PAD/Aortic Plaque, Age if 65-74, or Female] Above score calculated as 2 points each if present [Age > 75, or Stroke/TIA/TE]   Current medicines are reviewed at length with the patient today.   The patient does not have concerns regarding her medicines.  The following changes were made today:  Start Eliquis  Labs/ tests ordered today include:  Orders Placed This Encounter  Procedures  . EKG 12-Lead     Disposition:   FU with Will Camnitz 6 months  Signed, Will Meredith Leeds, MD  01/17/2017 11:43 AM     [facility] [address] [place] [address] [phone] (office) [phone] (fax)

## 2017-01-17 NOTE — Patient Instructions (Addendum)
Medication Instructions:    Your physician has recommended you make the following change in your medication:  1) START Coumadin -- Coumadin clinic will start you on this medication.  --- If you need a refill on your cardiac medications before your next appointment, please call your pharmacy. ---  Labwork:  None ordered  Testing/Procedures:  None ordered  Follow-Up: You have been referred to Coumadin clinic to start Coumadin.  Remote monitoring is used to monitor your Pacemaker of ICD from home. This monitoring reduces the number of office visits required to check your device to one time per year. It allows [place] to keep an eye on the functioning of your device to ensure it is working properly. You are scheduled for a device check from home on 04/18/2017. You may send your transmission at any time that day. If you have a wireless device, the transmission will be sent automatically. After your physician reviews your transmission, you will receive a postcard with your next transmission date.   Your physician wants you to follow-up in:  6 months with Dr. Curt Bears.  You will receive a reminder letter in the mail two months in advance. If you don't receive a letter, please call our office to schedule the follow-up appointment.  Thank you for choosing [facility]!!   Trinidad Curet, RN [phone]

## 2017-01-28 ENCOUNTER — Ambulatory Visit (HOSPITAL_COMMUNITY)
Admission: RE | Admit: 2017-01-28 | Discharge: 2017-01-28 | Disposition: A | Discharge status: Home / Self Care | Payer: Medicare Other | Source: Ambulatory Visit | Attending: Cardiovascular Disease | Admitting: Cardiovascular Disease

## 2017-01-28 DIAGNOSIS — I2510 Atherosclerotic heart disease of native coronary artery without angina pectoris: Secondary | ICD-10-CM | POA: Diagnosis not present

## 2017-01-28 DIAGNOSIS — I6523 Occlusion and stenosis of bilateral carotid arteries: Secondary | ICD-10-CM | POA: Diagnosis not present

## 2017-01-28 DIAGNOSIS — E785 Hyperlipidemia, unspecified: Secondary | ICD-10-CM | POA: Insufficient documentation

## 2017-01-28 DIAGNOSIS — R42 Dizziness and giddiness: Secondary | ICD-10-CM | POA: Insufficient documentation

## 2017-01-28 DIAGNOSIS — E119 Type 2 diabetes mellitus without complications: Secondary | ICD-10-CM | POA: Diagnosis not present

## 2017-01-28 DIAGNOSIS — Z87898 Personal history of other specified conditions: Secondary | ICD-10-CM | POA: Insufficient documentation

## 2017-01-28 DIAGNOSIS — Z951 Presence of aortocoronary bypass graft: Secondary | ICD-10-CM | POA: Insufficient documentation

## 2017-01-28 DIAGNOSIS — I10 Essential (primary) hypertension: Secondary | ICD-10-CM | POA: Diagnosis not present

## 2017-01-29 ENCOUNTER — Other Ambulatory Visit: Payer: Self-pay | Admitting: Cardiology

## 2017-01-31 ENCOUNTER — Telehealth: Payer: Self-pay | Admitting: *Deleted

## 2017-01-31 MED ORDER — WARFARIN SODIUM 5 MG PO TABS: 5.0000 mg | ORAL_TABLET | Freq: Every day | ORAL | 0 refills | Status: DC

## 2017-01-31 NOTE — Telephone Encounter (Signed)
Pt is on the CVRR schedule to be seen today for a NEW COUMADIN Appt. Pt has not been started any Anticoagulation therefore, we will not need to see the pt in CVRR today. Spoke with Venida Jarvis, RN & advised her on this & reminded her that the pt is on Dilantin.  Also, spoke with the pt & advised her that we needed to cancel today's appt, because no medications have been started for anticoagulation at this time & explained to her the reasoning & she verbalized understanding.  Pt is aware that Sherri, RN will call with more details on the medication & make another appt for the pt.

## 2017-01-31 NOTE — Addendum Note (Signed)
Addended by: Stanton Kidney on: 01/31/2017 02:01 PM   Modules accepted: Orders

## 2017-01-31 NOTE — Telephone Encounter (Signed)
Advised pt to start Coumadin 5 mg once daily.  Stopping ASA.  Rx sent to [facility] per pt request. She is scheduled to follow up/establish in the coumadin clinic on 5/15. Patient verbalized understanding and agreeable to plan.

## 2017-02-01 ENCOUNTER — Telehealth: Payer: Self-pay | Admitting: *Deleted

## 2017-02-01 NOTE — Telephone Encounter (Signed)
Called to speak with Sherri, Dr. Macky Lower nurse about taking her Coumadin today. She just received her coumadin this afternoon but already took her aspirin this morning.  She would like a call back about what to do about starting her Coumadin.

## 2017-02-01 NOTE — Telephone Encounter (Signed)
Informed pt ok to beginCoumadin tonight, even though she took her ASA this morning (reviewed w/ pharmacist). Reminded pt that she does not need to take ASA anymore. Patient verbalized understanding and agreeable to plan.

## 2017-02-08 ENCOUNTER — Ambulatory Visit (INDEPENDENT_AMBULATORY_CARE_PROVIDER_SITE_OTHER): Payer: Medicare Other | Admitting: Pharmacist

## 2017-02-08 DIAGNOSIS — I4891 Unspecified atrial fibrillation: Secondary | ICD-10-CM

## 2017-02-08 DIAGNOSIS — Z5181 Encounter for therapeutic drug level monitoring: Secondary | ICD-10-CM | POA: Insufficient documentation

## 2017-02-08 DIAGNOSIS — I480 Paroxysmal atrial fibrillation: Secondary | ICD-10-CM | POA: Insufficient documentation

## 2017-02-08 LAB — POCT INR: INR: 1.9

## 2017-02-08 MED ORDER — WARFARIN SODIUM 5 MG PO TABS: ORAL_TABLET | ORAL | 1 refills | Status: DC

## 2017-02-09 ENCOUNTER — Ambulatory Visit (INDEPENDENT_AMBULATORY_CARE_PROVIDER_SITE_OTHER): Payer: Medicare Other | Admitting: Podiatry

## 2017-02-09 ENCOUNTER — Encounter: Payer: Self-pay | Admitting: Podiatry

## 2017-02-09 DIAGNOSIS — M79676 Pain in unspecified toe(s): Secondary | ICD-10-CM | POA: Diagnosis not present

## 2017-02-09 DIAGNOSIS — B351 Tinea unguium: Secondary | ICD-10-CM | POA: Diagnosis not present

## 2017-02-09 DIAGNOSIS — M2012 Hallux valgus (acquired), left foot: Secondary | ICD-10-CM

## 2017-02-09 DIAGNOSIS — Q828 Other specified congenital malformations of skin: Secondary | ICD-10-CM | POA: Diagnosis not present

## 2017-02-09 DIAGNOSIS — M2032 Hallux varus (acquired), left foot: Secondary | ICD-10-CM

## 2017-02-09 DIAGNOSIS — E1140 Type 2 diabetes mellitus with diabetic neuropathy, unspecified: Secondary | ICD-10-CM | POA: Diagnosis not present

## 2017-02-09 NOTE — Progress Notes (Addendum)
Patient ID: Christine Gay, female   DOB: July 11, 1941, 76 y.o.   MRN: [id number] Complaint:  Visit Type: Patient returns to my office for continued preventative foot care services. Complaint: Patient states" my nails have grown long and thick and become painful to walk and wear shoes" Patient has been diagnosed with DM with no foot complications. The patient presents for preventative foot care services. No changes to ROS  Podiatric Exam: Vascular: dorsalis pedis and posterior tibial pulses are palpable bilateral. Capillary return is immediate. Temperature gradient is WNL. Skin turgor WNL  Sensorium: Diminished  Semmes Weinstein monofilament test. Normal tactile sensation bilaterally. Nail Exam: Pt has thick disfigured discolored nails with subungual debris noted bilateral entire nail hallux through fifth toenails Ulcer Exam: There is no evidence of ulcer or pre-ulcerative changes or infection. Orthopedic Exam: Muscle tone and strength are WNL. No limitations in general ROM. No crepitus or effusions noted. Foot type and digits show no abnormalities. Bony prominences are unremarkable. HAV 1st  MPJ B/L.  Hammer toes 2 B/l Skin: No Porokeratosis. No infection or ulcers.  Callus sub 5th B/L  Diagnosis:  Onychomycosis, , Pain in right toe, pain in left toes, Debride porokeratosis B/l  Treatment & Plan Procedures and Treatment: Consent by patient was obtained for treatment procedures. The patient understood the discussion of treatment and procedures well. All questions were answered thoroughly reviewed. Debridement of mycotic and hypertrophic toenails, 1 through 5 bilateral and clearing of subungual debris. No ulceration, no infection noted. .Initiate diabetic shoes for DPN, HAV and hammer toes. To be evaluated for diabetic shoes. Return Visit-Office Procedure: Patient instructed to return to the office for a follow up visit 3 months for continued evaluation and treatment.  Gardiner Barefoot DPM

## 2017-02-15 ENCOUNTER — Ambulatory Visit (INDEPENDENT_AMBULATORY_CARE_PROVIDER_SITE_OTHER): Payer: Medicare Other | Admitting: *Deleted

## 2017-02-15 DIAGNOSIS — I4891 Unspecified atrial fibrillation: Secondary | ICD-10-CM

## 2017-02-15 DIAGNOSIS — Z5181 Encounter for therapeutic drug level monitoring: Secondary | ICD-10-CM | POA: Diagnosis not present

## 2017-02-15 LAB — POCT INR: INR: 3.2

## 2017-02-23 ENCOUNTER — Ambulatory Visit (INDEPENDENT_AMBULATORY_CARE_PROVIDER_SITE_OTHER): Payer: Medicare Other | Admitting: *Deleted

## 2017-02-23 DIAGNOSIS — I4891 Unspecified atrial fibrillation: Secondary | ICD-10-CM

## 2017-02-23 DIAGNOSIS — Z5181 Encounter for therapeutic drug level monitoring: Secondary | ICD-10-CM | POA: Diagnosis not present

## 2017-02-23 LAB — POCT INR: INR: 2.7

## 2017-02-25 DIAGNOSIS — E876 Hypokalemia: Secondary | ICD-10-CM

## 2017-02-25 HISTORY — DX: Hypokalemia: E87.6

## 2017-03-04 ENCOUNTER — Ambulatory Visit (INDEPENDENT_AMBULATORY_CARE_PROVIDER_SITE_OTHER): Payer: Medicare Other | Admitting: *Deleted

## 2017-03-04 DIAGNOSIS — H401132 Primary open-angle glaucoma, bilateral, moderate stage: Secondary | ICD-10-CM | POA: Diagnosis not present

## 2017-03-04 DIAGNOSIS — I4891 Unspecified atrial fibrillation: Secondary | ICD-10-CM

## 2017-03-04 DIAGNOSIS — Z5181 Encounter for therapeutic drug level monitoring: Secondary | ICD-10-CM

## 2017-03-04 DIAGNOSIS — H35033 Hypertensive retinopathy, bilateral: Secondary | ICD-10-CM | POA: Diagnosis not present

## 2017-03-04 DIAGNOSIS — E113213 Type 2 diabetes mellitus with mild nonproliferative diabetic retinopathy with macular edema, bilateral: Secondary | ICD-10-CM | POA: Diagnosis not present

## 2017-03-04 LAB — POCT INR: INR: 2.1

## 2017-03-07 ENCOUNTER — Emergency Department (HOSPITAL_COMMUNITY): Payer: Medicare Other

## 2017-03-07 DIAGNOSIS — D509 Iron deficiency anemia, unspecified: Secondary | ICD-10-CM | POA: Insufficient documentation

## 2017-03-07 DIAGNOSIS — E119 Type 2 diabetes mellitus without complications: Secondary | ICD-10-CM | POA: Insufficient documentation

## 2017-03-07 DIAGNOSIS — I5032 Chronic diastolic (congestive) heart failure: Secondary | ICD-10-CM | POA: Diagnosis not present

## 2017-03-07 DIAGNOSIS — Z7984 Long term (current) use of oral hypoglycemic drugs: Secondary | ICD-10-CM | POA: Insufficient documentation

## 2017-03-07 DIAGNOSIS — I2510 Atherosclerotic heart disease of native coronary artery without angina pectoris: Secondary | ICD-10-CM | POA: Diagnosis not present

## 2017-03-07 DIAGNOSIS — Z7983 Long term (current) use of bisphosphonates: Secondary | ICD-10-CM | POA: Insufficient documentation

## 2017-03-07 DIAGNOSIS — I959 Hypotension, unspecified: Secondary | ICD-10-CM | POA: Insufficient documentation

## 2017-03-07 DIAGNOSIS — Z955 Presence of coronary angioplasty implant and graft: Secondary | ICD-10-CM | POA: Insufficient documentation

## 2017-03-07 DIAGNOSIS — Z7901 Long term (current) use of anticoagulants: Secondary | ICD-10-CM | POA: Insufficient documentation

## 2017-03-07 DIAGNOSIS — H409 Unspecified glaucoma: Secondary | ICD-10-CM | POA: Diagnosis not present

## 2017-03-07 DIAGNOSIS — I480 Paroxysmal atrial fibrillation: Secondary | ICD-10-CM | POA: Diagnosis not present

## 2017-03-07 DIAGNOSIS — Z950 Presence of cardiac pacemaker: Secondary | ICD-10-CM | POA: Insufficient documentation

## 2017-03-07 DIAGNOSIS — M25512 Pain in left shoulder: Secondary | ICD-10-CM | POA: Diagnosis present

## 2017-03-07 DIAGNOSIS — I110 Hypertensive heart disease with heart failure: Secondary | ICD-10-CM | POA: Insufficient documentation

## 2017-03-07 DIAGNOSIS — K219 Gastro-esophageal reflux disease without esophagitis: Secondary | ICD-10-CM | POA: Insufficient documentation

## 2017-03-07 DIAGNOSIS — R079 Chest pain, unspecified: Secondary | ICD-10-CM | POA: Diagnosis present

## 2017-03-07 DIAGNOSIS — R569 Unspecified convulsions: Secondary | ICD-10-CM | POA: Insufficient documentation

## 2017-03-07 DIAGNOSIS — Z885 Allergy status to narcotic agent status: Secondary | ICD-10-CM | POA: Insufficient documentation

## 2017-03-07 DIAGNOSIS — Z951 Presence of aortocoronary bypass graft: Secondary | ICD-10-CM | POA: Insufficient documentation

## 2017-03-07 DIAGNOSIS — E669 Obesity, unspecified: Secondary | ICD-10-CM | POA: Diagnosis not present

## 2017-03-07 DIAGNOSIS — R072 Precordial pain: Principal | ICD-10-CM | POA: Insufficient documentation

## 2017-03-07 DIAGNOSIS — E039 Hypothyroidism, unspecified: Secondary | ICD-10-CM | POA: Insufficient documentation

## 2017-03-07 DIAGNOSIS — Z833 Family history of diabetes mellitus: Secondary | ICD-10-CM | POA: Insufficient documentation

## 2017-03-07 DIAGNOSIS — I252 Old myocardial infarction: Secondary | ICD-10-CM | POA: Insufficient documentation

## 2017-03-07 DIAGNOSIS — Z79899 Other long term (current) drug therapy: Secondary | ICD-10-CM | POA: Insufficient documentation

## 2017-03-07 DIAGNOSIS — E785 Hyperlipidemia, unspecified: Secondary | ICD-10-CM | POA: Insufficient documentation

## 2017-03-07 DIAGNOSIS — Z888 Allergy status to other drugs, medicaments and biological substances status: Secondary | ICD-10-CM | POA: Insufficient documentation

## 2017-03-07 DIAGNOSIS — Z6834 Body mass index (BMI) 34.0-34.9, adult: Secondary | ICD-10-CM | POA: Diagnosis not present

## 2017-03-07 DIAGNOSIS — E7800 Pure hypercholesterolemia, unspecified: Secondary | ICD-10-CM | POA: Diagnosis not present

## 2017-03-07 LAB — BASIC METABOLIC PANEL
Anion gap: 10 (ref 5–15)
BUN: 14 mg/dL (ref 6–20)
CHLORIDE: 105 mmol/L (ref 101–111)
CO2: 23 mmol/L (ref 22–32)
CREATININE: 0.99 mg/dL (ref 0.44–1.00)
Calcium: 6.3 mg/dL — CL (ref 8.9–10.3)
GFR calc non Af Amer: 54 mL/min — ABNORMAL LOW (ref >60)
Glucose, Bld: 92 mg/dL (ref 65–99)
POTASSIUM: 3.9 mmol/L (ref 3.5–5.1)
Sodium: 138 mmol/L (ref 135–145)

## 2017-03-07 LAB — CBC
HCT: 35.4 % — ABNORMAL LOW (ref 36.0–46.0)
Hemoglobin: 11.9 g/dL — ABNORMAL LOW (ref 12.0–15.0)
MCH: 32.2 pg (ref 26.0–34.0)
MCHC: 33.6 g/dL (ref 30.0–36.0)
MCV: 95.7 fL (ref 78.0–100.0)
PLATELETS: 163 10*3/uL (ref 150–400)
RBC: 3.7 MIL/uL — AB (ref 3.87–5.11)
RDW: 12.2 % (ref 11.5–15.5)
WBC: 7.5 10*3/uL (ref 4.0–10.5)

## 2017-03-07 LAB — I-STAT TROPONIN, ED: Troponin i, poc: 0 ng/mL (ref 0.00–0.08)

## 2017-03-07 NOTE — ED Notes (Signed)
Critical calcium level called from lab,  Dr. Lacinda Axon made aware

## 2017-03-07 NOTE — ED Notes (Signed)
Pt has pain with palpation in left shoulder

## 2017-03-08 ENCOUNTER — Observation Stay (HOSPITAL_COMMUNITY): Payer: Medicare Other

## 2017-03-08 ENCOUNTER — Observation Stay (HOSPITAL_COMMUNITY)
Admission: EM | Admit: 2017-03-08 | Discharge: 2017-03-10 | Disposition: A | Discharge status: Home / Self Care | Payer: Medicare Other | Attending: Internal Medicine | Admitting: Internal Medicine

## 2017-03-08 ENCOUNTER — Encounter (HOSPITAL_COMMUNITY): Payer: Self-pay | Admitting: Internal Medicine

## 2017-03-08 DIAGNOSIS — M25512 Pain in left shoulder: Secondary | ICD-10-CM | POA: Clinically undetermined

## 2017-03-08 DIAGNOSIS — I25118 Atherosclerotic heart disease of native coronary artery with other forms of angina pectoris: Secondary | ICD-10-CM | POA: Diagnosis not present

## 2017-03-08 DIAGNOSIS — I4891 Unspecified atrial fibrillation: Secondary | ICD-10-CM | POA: Diagnosis present

## 2017-03-08 DIAGNOSIS — M62838 Other muscle spasm: Secondary | ICD-10-CM

## 2017-03-08 DIAGNOSIS — D509 Iron deficiency anemia, unspecified: Secondary | ICD-10-CM | POA: Diagnosis present

## 2017-03-08 DIAGNOSIS — R079 Chest pain, unspecified: Secondary | ICD-10-CM | POA: Diagnosis present

## 2017-03-08 DIAGNOSIS — I5032 Chronic diastolic (congestive) heart failure: Secondary | ICD-10-CM | POA: Diagnosis present

## 2017-03-08 DIAGNOSIS — I442 Atrioventricular block, complete: Secondary | ICD-10-CM | POA: Diagnosis present

## 2017-03-08 DIAGNOSIS — I25700 Atherosclerosis of coronary artery bypass graft(s), unspecified, with unstable angina pectoris: Secondary | ICD-10-CM | POA: Diagnosis not present

## 2017-03-08 DIAGNOSIS — R569 Unspecified convulsions: Secondary | ICD-10-CM

## 2017-03-08 DIAGNOSIS — I959 Hypotension, unspecified: Secondary | ICD-10-CM | POA: Diagnosis present

## 2017-03-08 DIAGNOSIS — E039 Hypothyroidism, unspecified: Secondary | ICD-10-CM | POA: Diagnosis present

## 2017-03-08 DIAGNOSIS — R072 Precordial pain: Secondary | ICD-10-CM | POA: Diagnosis not present

## 2017-03-08 DIAGNOSIS — I25810 Atherosclerosis of coronary artery bypass graft(s) without angina pectoris: Secondary | ICD-10-CM | POA: Diagnosis present

## 2017-03-08 DIAGNOSIS — I480 Paroxysmal atrial fibrillation: Secondary | ICD-10-CM | POA: Diagnosis present

## 2017-03-08 DIAGNOSIS — I10 Essential (primary) hypertension: Secondary | ICD-10-CM | POA: Diagnosis present

## 2017-03-08 DIAGNOSIS — M542 Cervicalgia: Secondary | ICD-10-CM

## 2017-03-08 HISTORY — DX: Hypokalemia: E87.6

## 2017-03-08 LAB — HEPATIC FUNCTION PANEL
ALBUMIN: 3.7 g/dL (ref 3.5–5.0)
ALK PHOS: 116 U/L (ref 38–126)
ALT: 16 U/L (ref 14–54)
AST: 23 U/L (ref 15–41)
Bilirubin, Direct: 0.1 mg/dL (ref 0.1–0.5)
Indirect Bilirubin: 0.5 mg/dL (ref 0.3–0.9)
TOTAL PROTEIN: 7.3 g/dL (ref 6.5–8.1)
Total Bilirubin: 0.6 mg/dL (ref 0.3–1.2)

## 2017-03-08 LAB — BASIC METABOLIC PANEL
ANION GAP: 9 (ref 5–15)
BUN: 13 mg/dL (ref 6–20)
CALCIUM: 6.7 mg/dL — AB (ref 8.9–10.3)
CHLORIDE: 104 mmol/L (ref 101–111)
CO2: 24 mmol/L (ref 22–32)
CREATININE: 0.9 mg/dL (ref 0.44–1.00)
GFR calc non Af Amer: >60 mL/min (ref >60)
Glucose, Bld: 96 mg/dL (ref 65–99)
Potassium: 3.4 mmol/L — ABNORMAL LOW (ref 3.5–5.1)
SODIUM: 137 mmol/L (ref 135–145)

## 2017-03-08 LAB — CBC WITH DIFFERENTIAL/PLATELET
Basophils Absolute: 0 10*3/uL (ref 0.0–0.1)
Basophils Relative: 0 %
EOS PCT: 2 %
Eosinophils Absolute: 0.2 10*3/uL (ref 0.0–0.7)
HEMATOCRIT: 35.9 % — AB (ref 36.0–46.0)
Hemoglobin: 12 g/dL (ref 12.0–15.0)
LYMPHS PCT: 25 %
Lymphs Abs: 2.3 10*3/uL (ref 0.7–4.0)
MCH: 32 pg (ref 26.0–34.0)
MCHC: 33.4 g/dL (ref 30.0–36.0)
MCV: 95.7 fL (ref 78.0–100.0)
MONO ABS: 0.6 10*3/uL (ref 0.1–1.0)
Monocytes Relative: 7 %
NEUTROS ABS: 6.1 10*3/uL (ref 1.7–7.7)
Neutrophils Relative %: 66 %
PLATELETS: 147 10*3/uL — AB (ref 150–400)
RBC: 3.75 MIL/uL — AB (ref 3.87–5.11)
RDW: 12.3 % (ref 11.5–15.5)
WBC: 9.2 10*3/uL (ref 4.0–10.5)

## 2017-03-08 LAB — PROTIME-INR
INR: 2.14
PROTHROMBIN TIME: 24.3 s — AB (ref 11.4–15.2)

## 2017-03-08 LAB — GLUCOSE, CAPILLARY
GLUCOSE-CAPILLARY: 110 mg/dL — AB (ref 65–99)
Glucose-Capillary: 90 mg/dL (ref 65–99)
Glucose-Capillary: 91 mg/dL (ref 65–99)
Glucose-Capillary: 89 mg/dL (ref 65–99)

## 2017-03-08 LAB — PHENYTOIN LEVEL, TOTAL: Phenytoin Lvl: 14.4 ug/mL (ref 10.0–20.0)

## 2017-03-08 LAB — I-STAT TROPONIN, ED: Troponin i, poc: 0.02 ng/mL (ref 0.00–0.08)

## 2017-03-08 LAB — PHOSPHORUS: Phosphorus: 2.6 mg/dL (ref 2.5–4.6)

## 2017-03-08 LAB — TROPONIN I

## 2017-03-08 LAB — MAGNESIUM: Magnesium: 2 mg/dL (ref 1.7–2.4)

## 2017-03-08 MED ORDER — ROSUVASTATIN CALCIUM 10 MG PO TABS
40.0000 mg | ORAL_TABLET | Freq: Every day | ORAL | Status: DC
  Administered 2017-03-08 – 2017-03-10 (×3): 40 mg via ORAL
  Filled 2017-03-08 (×3): qty 4

## 2017-03-08 MED ORDER — PHENYTOIN SODIUM EXTENDED 100 MG PO CAPS: 100.0000 mg | ORAL_CAPSULE | Freq: Two times a day (BID) | ORAL | Status: DC

## 2017-03-08 MED ORDER — RANOLAZINE ER 500 MG PO TB12
500.0000 mg | ORAL_TABLET | Freq: Two times a day (BID) | ORAL | Status: DC
  Administered 2017-03-08 – 2017-03-10 (×4): 500 mg via ORAL
  Filled 2017-03-08 (×4): qty 1

## 2017-03-08 MED ORDER — TIMOLOL MALEATE 0.5 % OP SOLN
1.0000 [drp] | Freq: Every day | OPHTHALMIC | Status: DC
  Administered 2017-03-08: 1 [drp] via OPHTHALMIC
  Filled 2017-03-08: qty 5

## 2017-03-08 MED ORDER — CALCIUM CARBONATE 1250 (500 CA) MG PO TABS
1.0000 | ORAL_TABLET | Freq: Three times a day (TID) | ORAL | Status: DC
  Administered 2017-03-08 (×3): 500 mg via ORAL
  Filled 2017-03-08 (×3): qty 1

## 2017-03-08 MED ORDER — WARFARIN - PHARMACIST DOSING INPATIENT
Freq: Every day | Status: DC
  Administered 2017-03-08 – 2017-03-09 (×2)

## 2017-03-08 MED ORDER — FERROUS SULFATE 325 (65 FE) MG PO TABS
325.0000 mg | ORAL_TABLET | Freq: Two times a day (BID) | ORAL | Status: DC
  Administered 2017-03-08 – 2017-03-10 (×5): 325 mg via ORAL
  Filled 2017-03-08 (×5): qty 1

## 2017-03-08 MED ORDER — CALCIUM CARBONATE ANTACID 500 MG PO CHEW
2.0000 | CHEWABLE_TABLET | Freq: Once | ORAL | Status: AC
  Administered 2017-03-08: 400 mg via ORAL
  Filled 2017-03-08: qty 2

## 2017-03-08 MED ORDER — PHENYTOIN SODIUM EXTENDED 100 MG PO CAPS
200.0000 mg | ORAL_CAPSULE | Freq: Every day | ORAL | Status: DC
  Administered 2017-03-08 – 2017-03-10 (×3): 200 mg via ORAL
  Filled 2017-03-08 (×3): qty 2

## 2017-03-08 MED ORDER — DILTIAZEM HCL 25 MG/5ML IV SOLN
10.0000 mg | Freq: Once | INTRAVENOUS | Status: AC
  Administered 2017-03-08: 10 mg via INTRAVENOUS
  Filled 2017-03-08: qty 5

## 2017-03-08 MED ORDER — LEVOTHYROXINE SODIUM 100 MCG PO TABS
100.0000 ug | ORAL_TABLET | Freq: Every day | ORAL | Status: DC
  Administered 2017-03-08 – 2017-03-10 (×3): 100 ug via ORAL
  Filled 2017-03-08 (×3): qty 1

## 2017-03-08 MED ORDER — ACETAMINOPHEN 325 MG PO TABS
650.0000 mg | ORAL_TABLET | Freq: Four times a day (QID) | ORAL | Status: DC | PRN
  Administered 2017-03-08: 650 mg via ORAL
  Filled 2017-03-08: qty 2

## 2017-03-08 MED ORDER — INSULIN ASPART 100 UNIT/ML ~~LOC~~ SOLN: 0.0000 [IU] | Freq: Three times a day (TID) | SUBCUTANEOUS | Status: DC

## 2017-03-08 MED ORDER — SODIUM CHLORIDE 0.9 % IV SOLN
1.0000 g | Freq: Once | INTRAVENOUS | Status: AC
  Administered 2017-03-08: 1 g via INTRAVENOUS
  Filled 2017-03-08: qty 10

## 2017-03-08 MED ORDER — NITROGLYCERIN 0.4 MG SL SUBL: 0.4000 mg | SUBLINGUAL_TABLET | SUBLINGUAL | Status: DC | PRN

## 2017-03-08 MED ORDER — ISOSORBIDE MONONITRATE ER 60 MG PO TB24
120.0000 mg | ORAL_TABLET | Freq: Two times a day (BID) | ORAL | Status: DC
  Administered 2017-03-08 – 2017-03-09 (×3): 120 mg via ORAL
  Filled 2017-03-08 (×3): qty 2

## 2017-03-08 MED ORDER — OXYCODONE HCL 5 MG PO TABS
5.0000 mg | ORAL_TABLET | Freq: Four times a day (QID) | ORAL | Status: DC | PRN
  Administered 2017-03-08 – 2017-03-10 (×7): 5 mg via ORAL
  Filled 2017-03-08 (×7): qty 1

## 2017-03-08 MED ORDER — PANTOPRAZOLE SODIUM 40 MG PO TBEC
40.0000 mg | DELAYED_RELEASE_TABLET | Freq: Every day | ORAL | Status: DC
  Administered 2017-03-08 – 2017-03-10 (×3): 40 mg via ORAL
  Filled 2017-03-08 (×3): qty 1

## 2017-03-08 MED ORDER — CARVEDILOL 3.125 MG PO TABS
3.1250 mg | ORAL_TABLET | Freq: Two times a day (BID) | ORAL | Status: DC
  Administered 2017-03-08 – 2017-03-10 (×5): 3.125 mg via ORAL
  Filled 2017-03-08 (×5): qty 1

## 2017-03-08 MED ORDER — ACETAMINOPHEN 650 MG RE SUPP: 650.0000 mg | Freq: Four times a day (QID) | RECTAL | Status: DC | PRN

## 2017-03-08 MED ORDER — EZETIMIBE 10 MG PO TABS
10.0000 mg | ORAL_TABLET | Freq: Every day | ORAL | Status: DC
  Administered 2017-03-08 – 2017-03-10 (×3): 10 mg via ORAL
  Filled 2017-03-08 (×3): qty 1

## 2017-03-08 MED ORDER — CYCLOSPORINE 0.05 % OP EMUL
1.0000 [drp] | Freq: Two times a day (BID) | OPHTHALMIC | Status: DC
  Administered 2017-03-08 – 2017-03-10 (×4): 1 [drp] via OPHTHALMIC
  Filled 2017-03-08 (×6): qty 1

## 2017-03-08 MED ORDER — WARFARIN SODIUM 5 MG PO TABS
5.0000 mg | ORAL_TABLET | Freq: Once | ORAL | Status: AC
  Administered 2017-03-08: 5 mg via ORAL
  Filled 2017-03-08: qty 1

## 2017-03-08 MED ORDER — PHENYTOIN SODIUM EXTENDED 100 MG PO CAPS
100.0000 mg | ORAL_CAPSULE | Freq: Every day | ORAL | Status: DC
  Administered 2017-03-08 – 2017-03-09 (×2): 100 mg via ORAL
  Filled 2017-03-08 (×3): qty 1

## 2017-03-08 NOTE — Progress Notes (Signed)
Pharmacy: Coumadin  76yof to continue home coumadin for afib. INR therapeutic at 2.14 (goal 2-3).  Home dose: 5mg  daily except 7.5mg  on Fridays - last taken 6/11  Plan: 1) Coumadin 5mg  tonight 2) Daily INR  Nena Jordan, PharmD, BCPS 03/08/2017, 12:10 PM

## 2017-03-08 NOTE — ED Provider Notes (Signed)
Christine Gay DEPT Provider Note   CSN: [id number] Arrival date & time: 03/07/17  2053     History   Chief Complaint Chief Complaint  Patient presents with  . Shoulder Pain    HPI Christine Gay is a 76 y.o. female.  This a 76 year old female who presents with left shoulder and left chest discomfort.  It comes in waves.  She states it started yesterday.  Sometimes midmorning she there have any discomfort like this before.  It does not make her nauseated, short of breath. She has a significant medical history such as anemia, angina, arthritis, CAD, chronic diastolic heart failure, GERD, high cholesterol, hypertension, hypothyroid, MI, CABG, type 2 diabetes, sickle cell trait, and seizures. She states that many years ago.  She had a goiter on her thyroid and she was given something to drink and equator went away.      Past Medical History:  Diagnosis Date  . Anemia    a. Noted on labs 04/2013.  . Anginal pain ([place])   . Arthritis    "legs" (06/10/2014)  . CAD (coronary artery disease)    a. s/p CABG 1992, b. s/p PCI several years ago; c. LHC 11/08: L-LAD ok, S-OM1 ok, S-OM2 CTO, prox to mid CFX 70% (unchanged), RCA non-dominant, occluded;  d.  Echo 2/11: EF 55%, mod LVH, Ao sclerosis;  e. Echo 6/14: mild LVH, mild focal basal sept hypertrophy, EF 55-65%, inf HK, Mild AI, mild MR, mild BAE;  f. Myoview 7/14: int risk-> cath 04/2013 s/p PTCA/DES to prox Cx 05/04/13.  . Chronic diastolic CHF (congestive heart failure) ([place])   . GERD (gastroesophageal reflux disease)   . Glaucoma   . High cholesterol   . Hypertension   . Hypothyroidism   . LBBB (left bundle branch block)    chronic  . Myocardial infarction ([place]) 03/1991 X 2; 07/1991  . Obesity   . Seizures ([place])    "I'm on Dilantin" (06/10/2014)  . Sickle cell trait ([place])   . Type II diabetes mellitus [facility])     Patient Active Problem List   Diagnosis Date Noted  . Encounter for therapeutic drug monitoring 02/08/2017  .  Atrial fibrillation ([place]) 02/08/2017  . Complete heart block ([place]) 09/28/2016  . Third degree heart block ([place])   . Unstable angina ([place])   . CAD (coronary artery disease) 11/11/2014  . Dizziness 07/29/2014  . GI bleed 06/12/2014  . GERD (gastroesophageal reflux disease) 06/11/2014  . Type II or unspecified type diabetes mellitus without mention of complication, not stated as uncontrolled 06/11/2014  . Glaucoma 06/11/2014  . Microcytic hypochromic anemia 06/10/2014  . Chronic diastolic CHF (congestive heart failure) ([place]) 08/09/2013  . Hypotension 05/18/2013  . Exertional dyspnea 03/15/2013  . CHF (congestive heart failure) ([place]) 03/15/2013  . MURMUR 03/02/2010  . Unspecified hypothyroidism 03/20/2009  . HYPERCHOLESTEROLEMIA  IIA 03/20/2009  . HYPOKALEMIA 03/20/2009  . HYPERTENSION, BENIGN 03/20/2009  . CAD, ARTERY BYPASS GRAFT 03/20/2009    Past Surgical History:  Procedure Laterality Date  . CARDIAC CATHETERIZATION     "several;  they went up to check"  . CARDIAC CATHETERIZATION N/A 09/28/2016   Procedure: Left Heart Cath and Coronary Angiography;  Surgeon: Peter M Martinique, MD;  Location: [place] CV LAB;  Service: Cardiovascular;  Laterality: N/A;  . CATARACT EXTRACTION W/ INTRAOCULAR LENS  IMPLANT, BILATERAL Bilateral   . CHOLECYSTECTOMY    . COLONOSCOPY N/A 06/12/2014   Procedure: COLONOSCOPY;  Surgeon: Cleotis Nipper, MD;  Location:  [facility] ENDOSCOPY;  Service: Endoscopy;  Laterality: N/A;  . CORONARY ANGIOPLASTY WITH STENT PLACEMENT  ? date; 05/04/2013   ? location; DES  to [place]  . CORONARY ARTERY BYPASS GRAFT  07/1991   "CABG X 3"  . EP IMPLANTABLE DEVICE N/A 09/29/2016   Procedure: Pacemaker Implant;  Surgeon: Will Meredith Leeds, MD;  Location: [place] CV LAB;  Service: Cardiovascular;  Laterality: N/A;  . ESOPHAGOGASTRODUODENOSCOPY N/A 06/12/2014   Procedure: ESOPHAGOGASTRODUODENOSCOPY (EGD);  Surgeon: Cleotis Nipper, MD;  Location[facility] ENDOSCOPY;  Service:  Endoscopy;  Laterality: N/A;  . LEFT AND RIGHT HEART CATHETERIZATION WITH CORONARY ANGIOGRAM N/A 05/04/2013   Procedure: LEFT AND RIGHT HEART CATHETERIZATION WITH CORONARY ANGIOGRAM;  Surgeon: Larey Dresser, MD;  Location[facility] C[facility] LAB;  Service: Cardiovascular;  Laterality: N/A;  . PERCUTANEOUS STENT INTERVENTION  05/04/2013   Procedure: PERCUTANEOUS STENT INTERVENTION;  Surgeon: Larey Dresser, MD;  Location: [facility] CATH LAB;  Service: Cardiovascular;;  . TUBAL LIGATION Bilateral 1969    OB History    No data available       Home Medications    Prior to Admission medications   Medication Sig Start Date End Date Taking? Authorizing Provider  acetaminophen (TYLENOL) 325 MG tablet Take 1-2 tablets (325-650 mg total) by mouth every 4 (four) hours as needed for mild pain. 09/30/16   Shirley Friar, PA-C  carvedilol (COREG) 6.25 MG tablet Take 0.5 tablets (3.125 mg total) by mouth 2 (two) times daily. 04/15/15   Larey Dresser, MD  cycloSPORINE (RESTASIS) 0.05 % ophthalmic emulsion Place 1 drop into both eyes 2 (two) times daily.    [provider]  ezetimibe (ZETIA) 10 MG tablet TAKE 1 TABLET BY MOUTH DAILY. 11/11/16   Larey Dresser, MD  ferrous sulfate 325 (65 FE) MG tablet Take 1 tablet (325 mg total) by mouth 2 (two) times daily with a meal. 06/12/14   Ghimire, Henreitta Leber, MD  furosemide (LASIX) 40 MG tablet Take 1.5 tablets (60 mg total) by mouth 2 (two) times daily. 11/19/16   Larey Dresser, MD  ibandronate (BONIVA) 150 MG tablet Take 150 mg by mouth every 30 (thirty) days. Take in the morning with a full glass of water, on an empty stomach, and do not take anything else by mouth or lie down for the next 30 min.    [provider]  isosorbide mononitrate (IMDUR) 60 MG 24 hr tablet TAKE 2 TABLETS BY MOUTH 2 TIMES A DAY. 07/02/16   Larey Dresser, MD  levothyroxine (SYNTHROID, LEVOTHROID) 100 MCG tablet Take 100 mcg by mouth daily before breakfast.    [provider]  metFORMIN (GLUCOPHAGE-XR) 500 MG 24 hr tablet Take 500 mg by mouth 2 (two) times daily. 500 mg by mouth twice daily 05/08/13   Dunn, Dayna N, PA-C  NITROSTAT 0.4 MG SL tablet PLACE 1 TABLET (0.4 MG TOTAL) UNDER THE TONGUE EVERY 5 (FIVE) MINUTES AS NEEDED FOR CHEST PAIN. 05/07/15   Larey Dresser, MD  pantoprazole (PROTONIX) 40 MG tablet TAKE 1 TABLET BY MOUTH DAILY. 01/31/17   Larey Dresser, MD  phenytoin (DILANTIN) 100 MG ER capsule Take 200 mg by mouth 2 (two) times daily.    [provider]  potassium chloride SA (K-DUR,KLOR-CON) 20 MEQ tablet Take 1 tablet (20 mEq total) by mouth 2 (two) times daily. 11/19/16   Larey Dresser, MD  rosuvastatin (CRESTOR) 40 MG tablet TAKE 1 TABLET BY MOUTH DAILY. 10/04/16  Larey Dresser, MD  timolol (TIMOPTIC) 0.5 % ophthalmic solution Place 1 drop into both eyes 2 (two) times daily.  09/21/16   [provider]  warfarin (COUMADIN) 5 MG tablet Take 1 to 1.5 tablets daily as directed by the [facility]. 02/08/17   Camnitz, Ocie Doyne, MD    Family History Family History  Problem Relation Age of Onset  . Diabetes Mother     Social History Social History  Substance Use Topics  . Smoking status: Never Smoker  . Smokeless tobacco: Never Used  . Alcohol use No     Allergies   Meperidine hcl; Nitroglycerin; and Morphine   Review of Systems Review of Systems  Constitutional: Negative for fever.  Respiratory: Negative for cough and shortness of breath.   Cardiovascular: Positive for chest pain.  Gastrointestinal: Negative for nausea.  Musculoskeletal: Positive for arthralgias.  All other systems reviewed and are negative.    Physical Exam Updated Vital Signs BP (!) 109/46 (BP Location: Right Arm)   Pulse 67   Temp 97.5 F (36.4 C) (Oral)   Resp 19   SpO2 98%   Physical Exam  Constitutional: She appears well-developed and well-nourished.  HENT:  Head: Normocephalic.  Eyes: Pupils are equal,  round, and reactive to light.  Neck: Normal range of motion.  Nursing note and vitals reviewed.    ED Treatments / Results  Labs (all labs ordered are listed, but only abnormal results are displayed) Labs Reviewed  BASIC METABOLIC PANEL - Abnormal; Notable for the following:       Result Value   Calcium 6.3 (*)    GFR calc non Af Amer 54 (*)    All other components within normal limits  CBC - Abnormal; Notable for the following:    RBC 3.70 (*)    Hemoglobin 11.9 (*)    HCT 35.4 (*)    All other components within normal limits  MAGNESIUM  I-STAT TROPOININ, ED    EKG  EKG Interpretation  Date/Time:  Monday March 07 2017 21:00:48 EDT Ventricular Rate:  61 PR Interval:  184 QRS Duration: 172 QT Interval:  534 QTC Calculation: 537 R Axis:   -83 Text Interpretation:  Atrial-sensed ventricular-paced rhythm Abnormal ECG Confirmed by Thayer Jew [phone]) on 03/08/2017 1:05:46 AM       Radiology Dg Chest 2 View  Result Date: 03/07/2017 CLINICAL DATA:  Acute onset of left shoulder pain, radiating to the left arm and neck. Initial encounter. EXAM: CHEST  2 VIEW COMPARISON:  Chest radiograph performed 09/30/2016 FINDINGS: The lungs are well-aerated and clear. There is no evidence of focal opacification, pleural effusion or pneumothorax. The heart is mildly enlarged. The patient is status post median sternotomy. A pacemaker is noted at the left chest wall, with leads ending at the right atrium and right ventricle. No acute osseous abnormalities are seen. Scattered calcification is seen along the proximal abdominal aorta. IMPRESSION: 1. No acute cardiopulmonary process seen. 2. Mild cardiomegaly. 3. Scattered aortic atherosclerosis. Electronically Signed   By: Garald Balding M.D.   On: 03/07/2017 21:51   Dg Shoulder Left  Result Date: 03/07/2017 CLINICAL DATA:  Shoulder pain radiating to left arm and neck since yesterday. EXAM: LEFT SHOULDER - 2+ VIEW COMPARISON:  09/30/2016 CXR  which includes the left shoulder FINDINGS: There is no evidence of acute fracture or dislocation. There is slight joint space narrowing of the glenohumeral articulation with minimal degenerate change noted. AC joint is maintained. The patient is status  post CABG with left-sided pacemaker apparatus projecting the axilla. The included ribs and lung are unremarkable. IMPRESSION: No acute fracture nor dislocation of the left shoulder. Osteoarthritis of the left glenohumeral joint. Electronically Signed   By: Ashley Royalty M.D.   On: 03/07/2017 21:51    Procedures Procedures (including critical care time)  Medications Ordered in ED Medications  calcium carbonate (TUMS - dosed in mg elemental calcium) chewable tablet 400 mg of elemental calcium (not administered)     Initial Impression / Assessment and Plan / ED Course  I have reviewed the triage vital signs and the nursing notes.  Pertinent labs & imaging results that were available during my care of the patient were reviewed by me and considered in my medical decision making (see chart for details).      I'm not sure why this patient has a low calcium but her symptoms are consistent with low calcium levels.  She's been given to Tums to supplement her calcium level.  I reviewed her kidney function, which is normal.  Labs within normal  Final Clinical Impressions(s) / ED Diagnoses   Final diagnoses:  None    New Prescriptions New Prescriptions   No medications on file     Junius Creamer, NP 03/08/17 0141    Merryl Hacker, MD 03/09/17 0005

## 2017-03-08 NOTE — Care Management Obs Status (Signed)
[facility] NOTIFICATION   Patient Details  Name: Christine Gay MRN: [id number] Date of Birth: 21-Oct-1940   Medicare Observation Status Notification Given:  Yes    Dawayne Patricia, RN 03/08/2017, 4:27 PM

## 2017-03-08 NOTE — Progress Notes (Signed)
Pt arrived to unit via stretcher from  ED.  Pt oriented to unit, VSS, CCMD notified.  Pt states she is still having left shoulder pain.  MD Hal Hope notified, orders received.  RN will continue to monitor pt closely.  Claudette Stapler, RN

## 2017-03-08 NOTE — H&P (Signed)
History and Physical    Rayola Everhart Vukelich UKG:[id number] DOB: December 14, 1940 DOA: 03/08/2017  PCP: Seward Carol, MD  Patient coming from: Home.  Chief Complaint: Chest and neck pain.  HPI: Christine Gay is a 76 y.o. female with history of CAD status post CABG and stenting with unremarkable cardiac cath in January 2018, complete heart block status post pacemaker in January 2018, paroxysmal atrial fibrillation, hypertension, hypothyroidism, diabetes mellitus presents to the ER with complaints of persistent neck pain radiating to the chest and shoulders. [name] symptoms started from yesterday morning. Has no relation to exertion denies any shortness of breath diaphoresis palpitations nausea or vomiting. Since the pain persisted patient came to the ER. Patient also complained of spasms of the muscles of the hand and lower extremity.  ED Course: In the ER chest x-ray EKG troponins were unremarkable. X-ray of the left shoulder was unremarkable for CT of the neck was done to rule out any radiculopathy. CT of the neck was unremarkable. Patient has persistent pain. In addition patient is found to have hypocalcemia and patient also was complaining of spasms of the muscles. Particularly of the lower extremity. Patient was given 1 g IV calcium gluconate and Os-Cal orally.  Review of Systems: As per HPI, rest all negative.   Past Medical History:  Diagnosis Date  . Anemia    a. Noted on labs 04/2013.  . Anginal pain ([place])   . Arthritis    "legs" (06/10/2014)  . CAD (coronary artery disease)    a. s/p CABG 1992, b. s/p PCI several years ago; c. LHC 11/08: L-LAD ok, S-OM1 ok, S-OM2 CTO, prox to mid CFX 70% (unchanged), RCA non-dominant, occluded;  d.  Echo 2/11: EF 55%, mod LVH, Ao sclerosis;  e. Echo 6/14: mild LVH, mild focal basal sept hypertrophy, EF 55-65%, inf HK, Mild AI, mild MR, mild BAE;  f. Myoview 7/14: int risk-> cath 04/2013 s/p PTCA/DES to prox Cx 05/04/13.  . Chronic diastolic CHF  (congestive heart failure) ([place])   . GERD (gastroesophageal reflux disease)   . Glaucoma   . High cholesterol   . Hypertension   . Hypothyroidism   . LBBB (left bundle branch block)    chronic  . Myocardial infarction ([place]) 03/1991 X 2; 07/1991  . Obesity   . Seizures ([place])    "I'm on Dilantin" (06/10/2014)  . Sickle cell trait ([place])   . Type II diabetes mellitus ([place])     Past Surgical History:  Procedure Laterality Date  . CARDIAC CATHETERIZATION     "several;  they went up to check"  . CARDIAC CATHETERIZATION N/A 09/28/2016   Procedure: Left Heart Cath and Coronary Angiography;  Surgeon: Peter M Martinique, MD;  Location: [facility] CV LAB;  Service: Cardiovascular;  Laterality: N/A;  . CATARACT EXTRACTION W/ INTRAOCULAR LENS  IMPLANT, BILATERAL Bilateral   . CHOLECYSTECTOMY    . COLONOSCOPY N/A 06/12/2014   Procedure: COLONOSCOPY;  Surgeon: Cleotis Nipper, MD;  Location[facility] ENDOSCOPY;  Service: Endoscopy;  Laterality: N/A;  . CORONARY ANGIOPLASTY WITH STENT PLACEMENT  ? date; 05/04/2013   ? location; DES  to [place]  . CORONARY ARTERY BYPASS GRAFT  07/1991   "CABG X 3"  . EP IMPLANTABLE DEVICE N/A 09/29/2016   Procedure: Pacemaker Implant;  Surgeon: Will Meredith Leeds, MD;  Location: [facility] CV LAB;  Service: Cardiovascular;  Laterality: N/A;  . ESOPHAGOGASTRODUODENOSCOPY N/A 06/12/2014   Procedure: ESOPHAGOGASTRODUODENOSCOPY (EGD);  Surgeon: Cleotis Nipper, MD;  Location:  [place] ENDOSCOPY;  Service: Endoscopy;  Laterality: N/A;  . LEFT AND RIGHT HEART CATHETERIZATION WITH CORONARY ANGIOGRAM N/A 05/04/2013   Procedure: LEFT AND RIGHT HEART CATHETERIZATION WITH CORONARY ANGIOGRAM;  Surgeon: Larey Dresser, MD;  Location: [facility] CATH LAB;  Service: Cardiovascular;  Laterality: N/A;  . PERCUTANEOUS STENT INTERVENTION  05/04/2013   Procedure: PERCUTANEOUS STENT INTERVENTION;  Surgeon: Larey Dresser, MD;  Location: [facility] CATH LAB;  Service: Cardiovascular;;  . TUBAL LIGATION Bilateral 1969       reports that she has never smoked. She has never used smokeless tobacco. She reports that she does not drink alcohol or use drugs.  Allergies  Allergen Reactions  . Meperidine Hcl Other (See Comments)    Demerol - mental status changes    . Nitroglycerin Other (See Comments)    Patch and Cream only - blisters  . Morphine Palpitations    Family History  Problem Relation Age of Onset  . Diabetes Mother     Prior to Admission medications   Medication Sig Start Date End Date Taking? Authorizing Provider  acetaminophen (TYLENOL) 325 MG tablet Take 1-2 tablets (325-650 mg total) by mouth every 4 (four) hours as needed for mild pain. 09/30/16  Yes Shirley Friar, PA-C  carvedilol (COREG) 6.25 MG tablet Take 0.5 tablets (3.125 mg total) by mouth 2 (two) times daily. 04/15/15  Yes Larey Dresser, MD  cycloSPORINE (RESTASIS) 0.05 % ophthalmic emulsion Place 1 drop into both eyes 2 (two) times daily.   Yes [provider]  ezetimibe (ZETIA) 10 MG tablet TAKE 1 TABLET BY MOUTH DAILY. 11/11/16  Yes Larey Dresser, MD  ferrous sulfate 325 (65 FE) MG tablet Take 1 tablet (325 mg total) by mouth 2 (two) times daily with a meal. 06/12/14  Yes Ghimire, Henreitta Leber, MD  furosemide (LASIX) 40 MG tablet Take 1.5 tablets (60 mg total) by mouth 2 (two) times daily. 11/19/16  Yes Larey Dresser, MD  ibandronate (BONIVA) 150 MG tablet Take 150 mg by mouth every 30 (thirty) days. Take in the morning with a full glass of water, on an empty stomach, and do not take anything else by mouth or lie down for the next 30 min.   Yes [provider]  isosorbide mononitrate (IMDUR) 60 MG 24 hr tablet TAKE 2 TABLETS BY MOUTH 2 TIMES A DAY. 07/02/16  Yes Larey Dresser, MD  levothyroxine (SYNTHROID, LEVOTHROID) 100 MCG tablet Take 100 mcg by mouth daily before breakfast.   Yes [provider]  metFORMIN (GLUCOPHAGE-XR) 500 MG 24 hr tablet Take 500 mg by mouth 2 (two) times daily.   05/08/13  Yes Dunn, Dayna N, PA-C  NITROSTAT 0.4 MG SL tablet PLACE 1 TABLET (0.4 MG TOTAL) UNDER THE TONGUE EVERY 5 (FIVE) MINUTES AS NEEDED FOR CHEST PAIN. 05/07/15  Yes Larey Dresser, MD  pantoprazole (PROTONIX) 40 MG tablet TAKE 1 TABLET BY MOUTH DAILY. 01/31/17  Yes Larey Dresser, MD  phenytoin (DILANTIN) 100 MG ER capsule Take 100-200 mg by mouth 2 (two) times daily. 200 mg in the morning and 100 mg in the evening   Yes [provider]  potassium chloride SA (K-DUR,KLOR-CON) 20 MEQ tablet Take 1 tablet (20 mEq total) by mouth 2 (two) times daily. 11/19/16  Yes Larey Dresser, MD  rosuvastatin (CRESTOR) 40 MG tablet TAKE 1 TABLET BY MOUTH DAILY. 10/04/16  Yes Larey Dresser, MD  timolol (TIMOPTIC) 0.5 % ophthalmic solution Place 1 drop into  both eyes daily.  09/21/16  Yes [provider]  warfarin (COUMADIN) 5 MG tablet Take 1 to 1.5 tablets daily as directed by the Coumadin Clinic. Patient taking differently: Take 5-7.5 mg by mouth daily. Take 1 daily and 1.5 on Friday 02/08/17  Yes Camnitz, Ocie Doyne, MD    Physical Exam: Vitals:   03/08/17 0330 03/08/17 0400 03/08/17 0430 03/08/17 0507  BP: 123/61 (!) 144/70 133/70 (!) 137/54  Pulse: 66 60 70 71  Resp: 20 13 15 18   Temp:    97.6 F (36.4 C)  TempSrc:    Oral  SpO2: 94% 99% 100% 100%  Weight:    79.3 kg (174 lb 14.4 oz)      Constitutional: Moderately built and nourished. Vitals:   03/08/17 0330 03/08/17 0400 03/08/17 0430 03/08/17 0507  BP: 123/61 (!) 144/70 133/70 (!) 137/54  Pulse: 66 60 70 71  Resp: 20 13 15 18   Temp:    97.6 F (36.4 C)  TempSrc:    Oral  SpO2: 94% 99% 100% 100%  Weight:    79.3 kg (174 lb 14.4 oz)   Eyes: Anicteric no pallor. ENMT: No discharge from the ears eyes nose and mouth. Neck: No mass felt. No neck rigidity. Respiratory: No rhonchi or crepitations. Cardiovascular: S1 and S2 heard no murmurs appreciated. Abdomen: Soft nontender bowel sounds  present. Musculoskeletal: No edema. No joint effusion. Skin: No rash. Skin appears warm. Neurologic: Alert awake oriented to time place and person. Moves all extremities. Psychiatric: Appears normal. Normal affect.   Labs on Admission: I have personally reviewed following labs and imaging studies  CBC:  Recent Labs Lab 03/07/17 2103 03/08/17 0223  WBC 7.5 9.2  NEUTROABS  --  6.1  HGB 11.9* 12.0  HCT 35.4* 35.9*  MCV 95.7 95.7  PLT 163 226*   Basic Metabolic Panel:  Recent Labs Lab 03/07/17 2103  NA 138  K 3.9  CL 105  CO2 23  GLUCOSE 92  BUN 14  CREATININE 0.99  CALCIUM 6.3*  MG 2.0   GFR: Estimated Creatinine Clearance: 45 mL/min (by C-G formula based on SCr of 0.99 mg/dL). Liver Function Tests:  Recent Labs Lab 03/08/17 0223  AST 23  ALT 16  ALKPHOS 116  BILITOT 0.6  PROT 7.3  ALBUMIN 3.7   No results for input(s): LIPASE, AMYLASE in the last 168 hours. No results for input(s): AMMONIA in the last 168 hours. Coagulation Profile:  Recent Labs Lab 03/04/17 1053  INR 2.1   Cardiac Enzymes: No results for input(s): CKTOTAL, CKMB, CKMBINDEX, TROPONINI in the last 168 hours. BNP (last 3 results)  Recent Labs  11/29/16 0936  PROBNP 789*   HbA1C: No results for input(s): HGBA1C in the last 72 hours. CBG: No results for input(s): GLUCAP in the last 168 hours. Lipid Profile: No results for input(s): CHOL, HDL, LDLCALC, TRIG, CHOLHDL, LDLDIRECT in the last 72 hours. Thyroid Function Tests: No results for input(s): TSH, T4TOTAL, FREET4, T3FREE, THYROIDAB in the last 72 hours. Anemia Panel: No results for input(s): VITAMINB12, FOLATE, FERRITIN, TIBC, IRON, RETICCTPCT in the last 72 hours. Urine analysis:    Component Value Date/Time   COLORURINE YELLOW 06/10/2014 1805   APPEARANCEUR CLEAR 06/10/2014 1805   LABSPEC 1.015 06/10/2014 1805   PHURINE 5.5 06/10/2014 1805   GLUCOSEU NEGATIVE 06/10/2014 1805   HGBUR NEGATIVE 06/10/2014 1805    BILIRUBINUR NEGATIVE 06/10/2014 1805   KETONESUR NEGATIVE 06/10/2014 1805   PROTEINUR NEGATIVE 06/10/2014 1805  UROBILINOGEN 0.2 06/10/2014 1805   NITRITE NEGATIVE 06/10/2014 1805   LEUKOCYTESUR TRACE (A) 06/10/2014 1805   Sepsis Labs: @LABRCNTIP (procalcitonin:4,lacticidven:4) )No results found for this or any previous visit (from the past 240 hour(s)).   Radiological Exams on Admission: Dg Chest 2 View  Result Date: 03/07/2017 CLINICAL DATA:  Acute onset of left shoulder pain, radiating to the left arm and neck. Initial encounter. EXAM: CHEST  2 VIEW COMPARISON:  Chest radiograph performed 09/30/2016 FINDINGS: The lungs are well-aerated and clear. There is no evidence of focal opacification, pleural effusion or pneumothorax. The heart is mildly enlarged. The patient is status post median sternotomy. A pacemaker is noted at the left chest wall, with leads ending at the right atrium and right ventricle. No acute osseous abnormalities are seen. Scattered calcification is seen along the proximal abdominal aorta. IMPRESSION: 1. No acute cardiopulmonary process seen. 2. Mild cardiomegaly. 3. Scattered aortic atherosclerosis. Electronically Signed   By: Garald Balding M.D.   On: 03/07/2017 21:51   Ct Cervical Spine Wo Contrast  Result Date: 03/08/2017 CLINICAL DATA:  Acute onset of neck pain, radiating to the left shoulder and left chest. Initial encounter. EXAM: CT CERVICAL SPINE WITHOUT CONTRAST TECHNIQUE: Multidetector CT imaging of the cervical spine was performed without intravenous contrast. Multiplanar CT image reconstructions were also generated. COMPARISON:  None. FINDINGS: Alignment: Normal. Skull base and vertebrae: No acute fracture. No primary bone lesion or focal pathologic process. Soft tissues and spinal canal: No prevertebral fluid or swelling. No visible canal hematoma. Disc levels: Intervertebral disc spaces are preserved. The bony foramina are grossly unremarkable. Upper chest:  Calcification is noted at the carotid bifurcations bilaterally. The visualized lung bases are clear. The thyroid gland is not well characterized. Other: No additional soft tissue abnormalities are seen. Cerebellar atrophy is noted. IMPRESSION: 1. No evidence of fracture or subluxation along the cervical spine. 2. Calcification at the carotid bifurcations bilaterally. Carotid ultrasound could be considered for further evaluation, when and as deemed clinically appropriate. 3. Cerebellar atrophy noted Electronically Signed   By: Garald Balding M.D.   On: 03/08/2017 04:55   Dg Shoulder Left  Result Date: 03/07/2017 CLINICAL DATA:  Shoulder pain radiating to left arm and neck since yesterday. EXAM: LEFT SHOULDER - 2+ VIEW COMPARISON:  09/30/2016 CXR which includes the left shoulder FINDINGS: There is no evidence of acute fracture or dislocation. There is slight joint space narrowing of the glenohumeral articulation with minimal degenerate change noted. AC joint is maintained. The patient is status post CABG with left-sided pacemaker apparatus projecting the axilla. The included ribs and lung are unremarkable. IMPRESSION: No acute fracture nor dislocation of the left shoulder. Osteoarthritis of the left glenohumeral joint. Electronically Signed   By: Ashley Royalty M.D.   On: 03/07/2017 21:51    EKG: Independently reviewed. Paced rhythm.  Assessment/Plan Principal Problem:   Chest pain Active Problems:   Hypothyroidism   HYPERTENSION, BENIGN   CAD, ARTERY BYPASS GRAFT   Chronic diastolic CHF (congestive heart failure) (HCC)   Microcytic hypochromic anemia   Complete heart block (HCC)   Atrial fibrillation (HCC)   Muscle spasm   Hypocalcemia    1. Chest pain - they did use her neck and shoulder. Has typical and atypical features. Since patient has history of CAD we will cycle cardiac markers. Patient has had an unremarkable cardiac cath in January 2018. Patient is on statins Coreg Imdur and  Coumadin. 2. Symptomatic hypocalcemia - for now we will hold her Lasix  until calcium was corrected. I have placed patient on Os-Cal 3 times daily and a repeat dose of IV calcium gluconate has been ordered. Check PTH vitamin D level phosphorus level. 3. Paroxysmal atrial fibrillation with chads 2 vasc score of 7 on Coumadin and Coreg. 4. Chronic diastolic CHF - holding of Lasix due to symptomatic hypocalcemia. 5. Normocytic normochromic anemia on iron supplements. 6. Hypothyroidism on Synthroid. 7. Diabetes mellitus type 2 on sliding scale coverage while inpatient.   DVT prophylaxis: Coumadin. Code Status: Full code.  Family Communication: [name] daughter.  Disposition Plan: Home.  Consults called: None.  Admission status: Observation.    Rise Patience MD [facility] Pager [phone].  If 7PM-7AM, please contact night-coverage [email or web address] Password [email or web address]  03/08/2017, 5:10 AM

## 2017-03-08 NOTE — ED Notes (Signed)
Pt and family upset about this RN asking same question that they ask on waiting room, this RN oriented that I am just trying to assess her correctly, daughter states "don't worry mom they don't move they just ask".

## 2017-03-08 NOTE — Progress Notes (Signed)
ANTICOAGULATION CONSULT NOTE - Initial Consult  Pharmacy Consult for Coumadin Indication: atrial fibrillation  Allergies  Allergen Reactions  . Meperidine Hcl Other (See Comments)    Demerol - mental status changes    . Nitroglycerin Other (See Comments)    Patch and Cream only - blisters  . Morphine Palpitations    Patient Measurements: Weight: 174 lb 14.4 oz (79.3 kg)  Vital Signs: Temp: 97.6 F (36.4 C) (06/12 0507) Temp Source: Oral (06/12 0507) BP: 137/54 (06/12 0507) Pulse Rate: 71 (06/12 0507)  Labs:  Recent Labs  03/07/17 2103 03/08/17 0223  HGB 11.9* 12.0  HCT 35.4* 35.9*  PLT 163 147*  CREATININE 0.99  --     Estimated Creatinine Clearance: 45 mL/min (by C-G formula based on SCr of 0.99 mg/dL).   Medical History: Past Medical History:  Diagnosis Date  . Anemia    a. Noted on labs 04/2013.  . Anginal pain ([place])   . Arthritis    "legs" (06/10/2014)  . CAD (coronary artery disease)    a. s/p CABG 1992, b. s/p PCI several years ago; c. LHC 11/08: L-LAD ok, S-OM1 ok, S-OM2 CTO, prox to mid CFX 70% (unchanged), RCA non-dominant, occluded;  d.  Echo 2/11: EF 55%, mod LVH, Ao sclerosis;  e. Echo 6/14: mild LVH, mild focal basal sept hypertrophy, EF 55-65%, inf HK, Mild AI, mild MR, mild BAE;  f. Myoview 7/14: int risk-> cath 04/2013 s/p PTCA/DES to prox Cx 05/04/13.  . Chronic diastolic CHF (congestive heart failure) ([place])   . GERD (gastroesophageal reflux disease)   . Glaucoma   . High cholesterol   . Hypertension   . Hypothyroidism   . LBBB (left bundle branch block)    chronic  . Myocardial infarction ([place]) 03/1991 X 2; 07/1991  . Obesity   . Seizures ([place])    "I'm on Dilantin" (06/10/2014)  . Sickle cell trait ([place])   . Type II diabetes mellitus (HCC)     Medications:  Prescriptions Prior to Admission  Medication Sig Dispense Refill Last Dose  . acetaminophen (TYLENOL) 325 MG tablet Take 1-2 tablets (325-650 mg total) by mouth every 4 (four) hours  as needed for mild pain.   unk  . carvedilol (COREG) 6.25 MG tablet Take 0.5 tablets (3.125 mg total) by mouth 2 (two) times daily.   03/07/2017 at 1700  . cycloSPORINE (RESTASIS) 0.05 % ophthalmic emulsion Place 1 drop into both eyes 2 (two) times daily.   03/07/2017 at Unknown time  . ezetimibe (ZETIA) 10 MG tablet TAKE 1 TABLET BY MOUTH DAILY. 90 tablet 3 03/07/2017 at Unknown time  . ferrous sulfate 325 (65 FE) MG tablet Take 1 tablet (325 mg total) by mouth 2 (two) times daily with a meal. 90 tablet 0 03/07/2017 at Unknown time  . furosemide (LASIX) 40 MG tablet Take 1.5 tablets (60 mg total) by mouth 2 (two) times daily. 90 tablet 4 03/07/2017 at Unknown time  . ibandronate (BONIVA) 150 MG tablet Take 150 mg by mouth every 30 (thirty) days. Take in the morning with a full glass of water, on an empty stomach, and do not take anything else by mouth or lie down for the next 30 min.   03/03/2017  . isosorbide mononitrate (IMDUR) 60 MG 24 hr tablet TAKE 2 TABLETS BY MOUTH 2 TIMES A DAY. 966 tablet 3 03/07/2017 at Unknown time  . levothyroxine (SYNTHROID, LEVOTHROID) 100 MCG tablet Take 100 mcg by mouth daily before breakfast.  03/07/2017 at Unknown time  . metFORMIN (GLUCOPHAGE-XR) 500 MG 24 hr tablet Take 500 mg by mouth 2 (two) times daily.    03/07/2017 at Unknown time  . NITROSTAT 0.4 MG SL tablet PLACE 1 TABLET (0.4 MG TOTAL) UNDER THE TONGUE EVERY 5 (FIVE) MINUTES AS NEEDED FOR CHEST PAIN. 25 tablet 11 unk  . pantoprazole (PROTONIX) 40 MG tablet TAKE 1 TABLET BY MOUTH DAILY. 90 tablet 2 03/07/2017 at Unknown time  . phenytoin (DILANTIN) 100 MG ER capsule Take 100-200 mg by mouth 2 (two) times daily. 200 mg in the morning and 100 mg in the evening   03/07/2017 at Unknown time  . potassium chloride SA (K-DUR,KLOR-CON) 20 MEQ tablet Take 1 tablet (20 mEq total) by mouth 2 (two) times daily. 60 tablet 3 03/07/2017 at Unknown time  . rosuvastatin (CRESTOR) 40 MG tablet TAKE 1 TABLET BY MOUTH DAILY. 90 tablet 2  03/07/2017 at Unknown time  . timolol (TIMOPTIC) 0.5 % ophthalmic solution Place 1 drop into both eyes daily.   6 03/07/2017 at Unknown time  . warfarin (COUMADIN) 5 MG tablet Take 1 to 1.5 tablets daily as directed by the Coumadin Clinic. (Patient taking differently: Take 5-7.5 mg by mouth daily. Take 1 daily and 1.5 on Friday) 40 tablet 1 03/07/2017 at 1700    Assessment: 76 y.o. female admitted with chest pain, h/o AFib, to continue Coumadin   INR  2.1 during office visit on 6/8  Goal of Therapy:  INR 2-3 Monitor platelets by anticoagulation protocol: Yes   Plan:  F/U daily INR  Zakariya Knickerbocker, Bronson Curb 03/08/2017,5:20 AM

## 2017-03-08 NOTE — Consult Note (Signed)
Cardiology Consultation:   Patient ID: RAYELLE ARMOR; [id number]; Mar 24, 1941   Admit date: 03/08/2017 Date of Consult: 03/08/2017  Primary Care Provider: Seward Carol, MD Primary Cardiologist:  Aundra Dubin  Primary Electrophysiologist:  Curt Bears   Patient Profile:   Christine Gay is a 76 y.o. female with a hx of PAF, CAD  who is being seen today for the evaluation of Chest and shoulder pain at the request of  Dr. Wendee Beavers .  History of Present Illness:   Christine Gay is a 75 year old female with a history of coronary artery disease-status post coronary artery bypass grafting. She had an unremarkable heart catheterization in January, 2018. She has a history of complete heart block and is status post pacemaker placement in January 2018.  She has a history of paroxysmal atrial fibrillation, hypertension, hypothyroidism, diabetes mellitus  She started having chest pain 3 days ago. The pain was intermittent but lasted off and on for 2-3 days. Was described as a tightness or fullness in her chest. There was radiation out into her arm and up into her shoulder. The pain was not pleuritic. It was not associated with any shortness of breath or diaphoresis. There was no syncopal. Presyncope. She did not think to take a nitroglycerin.   Past Medical History:  Diagnosis Date  . Anemia    a. Noted on labs 04/2013.  . Anginal pain ([place])   . Arthritis    "legs" (06/10/2014)  . CAD (coronary artery disease)    a. s/p CABG 1992, b. s/p PCI several years ago; c. LHC 11/08: L-LAD ok, S-OM1 ok, S-OM2 CTO, prox to mid CFX 70% (unchanged), RCA non-dominant, occluded;  d.  Echo 2/11: EF 55%, mod LVH, Ao sclerosis;  e. Echo 6/14: mild LVH, mild focal basal sept hypertrophy, EF 55-65%, inf HK, Mild AI, mild MR, mild BAE;  f. Myoview 7/14: int risk-> cath 04/2013 s/p PTCA/DES to prox Cx 05/04/13.  . Chronic diastolic CHF (congestive heart failure) ([place])   . GERD (gastroesophageal reflux disease)   . Glaucoma     . High cholesterol   . Hypertension   . Hypokalemia 02/2017  . Hypothyroidism   . LBBB (left bundle branch block)    chronic  . Myocardial infarction ([place]) 03/1991 X 2; 07/1991  . Obesity   . Seizures ([place])    "I'm on Dilantin" (06/10/2014)  . Sickle cell trait ([place])   . Type II diabetes mellitus ([place])     Past Surgical History:  Procedure Laterality Date  . CARDIAC CATHETERIZATION     "several;  they went up to check"  . CARDIAC CATHETERIZATION N/A 09/28/2016   Procedure: Left Heart Cath and Coronary Angiography;  Surgeon: Peter M Martinique, MD;  Location: [facility];  Service: Cardiovascular;  Laterality: N/A;  . CATARACT EXTRACTION W/ INTRAOCULAR LENS  IMPLANT, BILATERAL Bilateral   . CHOLECYSTECTOMY    . COLONOSCOPY N/A 06/12/2014   Procedure: COLONOSCOPY;  Surgeon: Cleotis Nipper, MD;  Location: [facility];  Service: Endoscopy;  Laterality: N/A;  . CORONARY ANGIOPLASTY WITH STENT PLACEMENT  ? date; 05/04/2013   ? location; DES  to [place]  . CORONARY ARTERY BYPASS GRAFT  07/1991   "CABG X 3"  . EP IMPLANTABLE DEVICE N/A 09/29/2016   Procedure: Pacemaker Implant;  Surgeon: Will Meredith Leeds, MD;  Location: [facility];  Service: Cardiovascular;  Laterality: N/A;  . ESOPHAGOGASTRODUODENOSCOPY N/A 06/12/2014   Procedure: ESOPHAGOGASTRODUODENOSCOPY (EGD);  Surgeon: Cleotis Nipper, MD;  Location: [facility];  Service: Endoscopy;  Laterality: N/A;  . LEFT AND RIGHT HEART CATHETERIZATION WITH CORONARY ANGIOGRAM N/A 05/04/2013   Procedure: LEFT AND RIGHT HEART CATHETERIZATION WITH CORONARY ANGIOGRAM;  Surgeon: Larey Dresser, MD;  Location: [facility] CATH LAB;  Service: Cardiovascular;  Laterality: N/A;  . PERCUTANEOUS STENT INTERVENTION  05/04/2013   Procedure: PERCUTANEOUS STENT INTERVENTION;  Surgeon: Larey Dresser, MD;  Location: [facility] CATH LAB;  Service: Cardiovascular;;  . TUBAL LIGATION Bilateral 1969     Inpatient Medications: Scheduled Meds: . calcium carbonate   1 tablet Oral TID WC  . carvedilol  3.125 mg Oral BID WC  . cycloSPORINE  1 drop Both Eyes BID  . ezetimibe  10 mg Oral Daily  . ferrous sulfate  325 mg Oral BID WC  . insulin aspart  0-9 Units Subcutaneous TID WC  . isosorbide mononitrate  120 mg Oral BID  . levothyroxine  100 mcg Oral QAC breakfast  . pantoprazole  40 mg Oral Daily  . phenytoin  200 mg Oral Daily   And  . phenytoin  100 mg Oral QHS  . rosuvastatin  40 mg Oral Daily  . timolol  1 drop Both Eyes Daily  . warfarin  5 mg Oral ONCE-1800  . Warfarin - Pharmacist Dosing Inpatient   Does not apply q1800   Continuous Infusions:  PRN Meds: acetaminophen **OR** acetaminophen, nitroGLYCERIN, oxyCODONE  Allergies:    Allergies  Allergen Reactions  . Meperidine Hcl Other (See Comments)    Demerol - mental status changes    . Nitroglycerin Other (See Comments)    Patch and Cream only - blisters  . Morphine Palpitations    Social History:   Social History   Social History  . Marital status: Married    Spouse name: N/A  . Number of children: N/A  . Years of education: N/A   Occupational History  . Not on file.   Social History Main Topics  . Smoking status: Never Smoker  . Smokeless tobacco: Never Used  . Alcohol use No  . Drug use: No  . Sexual activity: No   Other Topics Concern  . Not on file   Social History Narrative  . No narrative on file    Family History:   The patient's family history includes Diabetes in her mother.  ROS:  Please see the history of present illness.  ROS  All other ROS reviewed and negative.     Physical Exam/Data:   Vitals:   03/08/17 0400 03/08/17 0430 03/08/17 0507 03/08/17 1319  BP: (!) 144/70 133/70 (!) 137/54 (!) 107/51  Pulse: 60 70 71 60  Resp: 13 15 18 18   Temp:   97.6 F (36.4 C) 98 F (36.7 C)  TempSrc:   Oral Oral  SpO2: 99% 100% 100% 100%  Weight:   174 lb 14.4 oz (79.3 kg)     Intake/Output Summary (Last 24 hours) at 03/08/17 1506 Last data  filed at 03/08/17 1300  Gross per 24 hour  Intake              600 ml  Output             1450 ml  Net             -850 ml   Filed Weights   03/08/17 0507  Weight: 174 lb 14.4 oz (79.3 kg)   Body mass index is 34.16 kg/m.  General:  Elderly female  in no acute  distress HEENT: normal Lymph: no adenopathy Neck: no JVD Endocrine:  No thryomegaly Vascular: No carotid bruits; FA pulses 2+ bilaterally without bruits  Cardiac:  normal S1, S2; RRR; soft systolic  murmur Lungs:  clear to auscultation bilaterally, no wheezing, rhonchi or rales  Abd: soft, nontender, no hepatomegaly  Ext: no edema Musculoskeletal:  No deformities, BUE and BLE strength normal and equal Skin: warm and dry  Neuro:  CNs 2-12 intact, no focal abnormalities noted Psych:  Normal affect    EKG:  The EKG was personally reviewed and demonstrates AV pacing   Relevant CV Studies:   Laboratory Data:  Chemistry Recent Labs Lab 03/07/17 2103 03/08/17 0525  NA 138 137  K 3.9 3.4*  CL 105 104  CO2 23 24  GLUCOSE 92 96  BUN 14 13  CREATININE 0.99 0.90  CALCIUM 6.3* 6.7*  GFRNONAA 54* >60  GFRAA >60 >60  ANIONGAP 10 9     Recent Labs Lab 03/08/17 0223  PROT 7.3  ALBUMIN 3.7  AST 23  ALT 16  ALKPHOS 116  BILITOT 0.6   Hematology Recent Labs Lab 03/07/17 2103 03/08/17 0223  WBC 7.5 9.2  RBC 3.70* 3.75*  HGB 11.9* 12.0  HCT 35.4* 35.9*  MCV 95.7 95.7  MCH 32.2 32.0  MCHC 33.6 33.4  RDW 12.2 12.3  PLT 163 147*   Cardiac Enzymes Recent Labs Lab 03/08/17 0525 03/08/17 1024  TROPONINI <0.03 <0.03    Recent Labs Lab 03/07/17 2113 03/08/17 0234  TROPIPOC 0.00 0.02    BNPNo results for input(s): BNP, PROBNP in the last 168 hours.  DDimer No results for input(s): DDIMER in the last 168 hours.  Radiology/Studies:  Dg Chest 2 View  Result Date: 03/07/2017 CLINICAL DATA:  Acute onset of left shoulder pain, radiating to the left arm and neck. Initial encounter. EXAM: CHEST  2 VIEW  COMPARISON:  Chest radiograph performed 09/30/2016 FINDINGS: The lungs are well-aerated and clear. There is no evidence of focal opacification, pleural effusion or pneumothorax. The heart is mildly enlarged. The patient is status post median sternotomy. A pacemaker is noted at the left chest wall, with leads ending at the right atrium and right ventricle. No acute osseous abnormalities are seen. Scattered calcification is seen along the proximal abdominal aorta. IMPRESSION: 1. No acute cardiopulmonary process seen. 2. Mild cardiomegaly. 3. Scattered aortic atherosclerosis. Electronically Signed   By: Garald Balding M.D.   On: 03/07/2017 21:51   Ct Cervical Spine Wo Contrast  Result Date: 03/08/2017 CLINICAL DATA:  Acute onset of neck pain, radiating to the left shoulder and left chest. Initial encounter. EXAM: CT CERVICAL SPINE WITHOUT CONTRAST TECHNIQUE: Multidetector CT imaging of the cervical spine was performed without intravenous contrast. Multiplanar CT image reconstructions were also generated. COMPARISON:  None. FINDINGS: Alignment: Normal. Skull base and vertebrae: No acute fracture. No primary bone lesion or focal pathologic process. Soft tissues and spinal canal: No prevertebral fluid or swelling. No visible canal hematoma. Disc levels: Intervertebral disc spaces are preserved. The bony foramina are grossly unremarkable. Upper chest: Calcification is noted at the carotid bifurcations bilaterally. The visualized lung bases are clear. The thyroid gland is not well characterized. Other: No additional soft tissue abnormalities are seen. Cerebellar atrophy is noted. IMPRESSION: 1. No evidence of fracture or subluxation along the cervical spine. 2. Calcification at the carotid bifurcations bilaterally. Carotid ultrasound could be considered for further evaluation, when and as deemed clinically appropriate. 3. Cerebellar atrophy noted Electronically Signed  By: Garald Balding M.D.   On: 03/08/2017 04:55    Dg Shoulder Left  Result Date: 03/07/2017 CLINICAL DATA:  Shoulder pain radiating to left arm and neck since yesterday. EXAM: LEFT SHOULDER - 2+ VIEW COMPARISON:  09/30/2016 CXR which includes the left shoulder FINDINGS: There is no evidence of acute fracture or dislocation. There is slight joint space narrowing of the glenohumeral articulation with minimal degenerate change noted. AC joint is maintained. The patient is status post CABG with left-sided pacemaker apparatus projecting the axilla. The included ribs and lung are unremarkable. IMPRESSION: No acute fracture nor dislocation of the left shoulder. Osteoarthritis of the left glenohumeral joint. Electronically Signed   By: Ashley Royalty M.D.   On: 03/07/2017 21:51    Assessment and Plan:   1. Chest pain: The patient's symptoms of chest pain are somewhat disturbing but the fact that she still has normal troponin levels after having 3 days of chest discomfort is reassuring that this is not a cardiac issue. She does not have any EKG changes. Her heart catheterization in January looked unchanged from previous cath.  She does have significant native CAD and this could be the cause of some degree of baseline angina.  She is on a very high dose of Imdur. It possible that Ranexa may be of some benefit.  Will add Ranexa 500 PO BID .   Would titrate up in several weeks to 1000 mg PO BID .    Marland Kitchen  Continue to draw troponins.   Signed, Mertie Moores, MD  03/08/2017 3:06 PM

## 2017-03-08 NOTE — ED Notes (Signed)
[name] daughter asking if her mom can drink.  This RN informed her she was roomed and encouraged her to wait until the MD can speak to her.

## 2017-03-08 NOTE — Progress Notes (Addendum)
Patient seen and admitted earlier the same by my associate. Please refer to H&P for details regarding assessment and plan.  I have consulted cardiology for further evaluation recommendations given history of CAD.  Patient is a 76 year old female with history of CAD status post CABG (1992) and stenting several years ago. Who presented complaining of left-sided chest discomfort which radiated up her left arm. Patient seems to have typical and atypical features as such cardiology consulted to see if we needed to do any further testing other than troponins.  Cardiology consulted by myself and patient started on Ranexa.  General: Patient in no acute distress, alert and awake Cardiovascular: No cyanosis, S1 and S2 within normal limits Pulmonary: Pt has no increase wob, no wheezes  Will plan on reassessing patient next am or sooner should any new concerns arise.  Grady Mohabir  Addendum: For hypocalcemia patient given calcium gluconate and oral calcium replacement. Will reassess next am.

## 2017-03-09 DIAGNOSIS — R569 Unspecified convulsions: Secondary | ICD-10-CM | POA: Diagnosis not present

## 2017-03-09 DIAGNOSIS — I4891 Unspecified atrial fibrillation: Secondary | ICD-10-CM

## 2017-03-09 DIAGNOSIS — E039 Hypothyroidism, unspecified: Secondary | ICD-10-CM | POA: Diagnosis not present

## 2017-03-09 DIAGNOSIS — I959 Hypotension, unspecified: Secondary | ICD-10-CM

## 2017-03-09 DIAGNOSIS — I480 Paroxysmal atrial fibrillation: Secondary | ICD-10-CM

## 2017-03-09 DIAGNOSIS — R072 Precordial pain: Secondary | ICD-10-CM | POA: Diagnosis not present

## 2017-03-09 DIAGNOSIS — I10 Essential (primary) hypertension: Secondary | ICD-10-CM

## 2017-03-09 DIAGNOSIS — I5032 Chronic diastolic (congestive) heart failure: Secondary | ICD-10-CM

## 2017-03-09 DIAGNOSIS — I442 Atrioventricular block, complete: Secondary | ICD-10-CM | POA: Diagnosis not present

## 2017-03-09 DIAGNOSIS — R079 Chest pain, unspecified: Secondary | ICD-10-CM

## 2017-03-09 DIAGNOSIS — I25118 Atherosclerotic heart disease of native coronary artery with other forms of angina pectoris: Secondary | ICD-10-CM

## 2017-03-09 DIAGNOSIS — M25512 Pain in left shoulder: Secondary | ICD-10-CM

## 2017-03-09 LAB — CBC
HEMATOCRIT: 31.8 % — AB (ref 36.0–46.0)
HEMOGLOBIN: 10.6 g/dL — AB (ref 12.0–15.0)
MCH: 31.8 pg (ref 26.0–34.0)
MCHC: 33.3 g/dL (ref 30.0–36.0)
MCV: 95.5 fL (ref 78.0–100.0)
Platelets: 157 10*3/uL (ref 150–400)
RBC: 3.33 MIL/uL — AB (ref 3.87–5.11)
RDW: 12.1 % (ref 11.5–15.5)
WBC: 6.4 10*3/uL (ref 4.0–10.5)

## 2017-03-09 LAB — BASIC METABOLIC PANEL
ANION GAP: 8 (ref 5–15)
BUN: 11 mg/dL (ref 6–20)
CALCIUM: 6.6 mg/dL — AB (ref 8.9–10.3)
CO2: 23 mmol/L (ref 22–32)
Chloride: 107 mmol/L (ref 101–111)
Creatinine, Ser: 0.88 mg/dL (ref 0.44–1.00)
GLUCOSE: 97 mg/dL (ref 65–99)
POTASSIUM: 3.7 mmol/L (ref 3.5–5.1)
Sodium: 138 mmol/L (ref 135–145)

## 2017-03-09 LAB — GLUCOSE, CAPILLARY
GLUCOSE-CAPILLARY: 172 mg/dL — AB (ref 65–99)
GLUCOSE-CAPILLARY: 71 mg/dL (ref 65–99)
Glucose-Capillary: 88 mg/dL (ref 65–99)
Glucose-Capillary: 243 mg/dL — ABNORMAL HIGH (ref 65–99)

## 2017-03-09 LAB — VITAMIN D 25 HYDROXY (VIT D DEFICIENCY, FRACTURES): VIT D 25 HYDROXY: 5.9 ng/mL — AB (ref 30.0–100.0)

## 2017-03-09 LAB — VITAMIN B12: Vitamin B-12: 227 pg/mL (ref 180–914)

## 2017-03-09 LAB — PROTIME-INR
INR: 2.06
PROTHROMBIN TIME: 23.5 s — AB (ref 11.4–15.2)

## 2017-03-09 LAB — RETICULOCYTES
RBC.: 3.81 MIL/uL — ABNORMAL LOW (ref 3.87–5.11)
RETIC COUNT ABSOLUTE: 53.3 10*3/uL (ref 19.0–186.0)
Retic Ct Pct: 1.4 % (ref 0.4–3.1)

## 2017-03-09 LAB — FERRITIN: Ferritin: 66 ng/mL (ref 11–307)

## 2017-03-09 LAB — IRON AND TIBC
Iron: 108 ug/dL (ref 28–170)
SATURATION RATIOS: 44 % — AB (ref 10.4–31.8)
TIBC: 244 ug/dL — AB (ref 250–450)
UIBC: 136 ug/dL

## 2017-03-09 LAB — FOLATE: FOLATE: 8.5 ng/mL (ref >5.9)

## 2017-03-09 LAB — MAGNESIUM: MAGNESIUM: 2.2 mg/dL (ref 1.7–2.4)

## 2017-03-09 MED ORDER — WARFARIN SODIUM 5 MG PO TABS
5.0000 mg | ORAL_TABLET | Freq: Once | ORAL | Status: AC
  Administered 2017-03-09: 5 mg via ORAL
  Filled 2017-03-09: qty 1

## 2017-03-09 MED ORDER — VITAMIN D 1000 UNITS PO TABS
1000.0000 [IU] | ORAL_TABLET | Freq: Every day | ORAL | Status: DC
  Administered 2017-03-09 – 2017-03-10 (×2): 1000 [IU] via ORAL
  Filled 2017-03-09 (×2): qty 1

## 2017-03-09 MED ORDER — ISOSORBIDE MONONITRATE ER 60 MG PO TB24
60.0000 mg | ORAL_TABLET | Freq: Two times a day (BID) | ORAL | Status: DC
Start: 2017-03-09 — End: 2017-03-10
  Administered 2017-03-09 – 2017-03-10 (×2): 60 mg via ORAL
  Filled 2017-03-09 (×2): qty 1

## 2017-03-09 MED ORDER — CYANOCOBALAMIN 1000 MCG/ML IJ SOLN
1000.0000 ug | Freq: Every day | INTRAMUSCULAR | Status: DC
  Administered 2017-03-09: 1000 ug via INTRAMUSCULAR
  Filled 2017-03-09 (×2): qty 1

## 2017-03-09 MED ORDER — CALCIUM CARBONATE 1250 (500 CA) MG PO TABS
1.0000 | ORAL_TABLET | Freq: Four times a day (QID) | ORAL | Status: DC
  Administered 2017-03-09 – 2017-03-10 (×4): 500 mg via ORAL
  Filled 2017-03-09 (×4): qty 1

## 2017-03-09 MED ORDER — SODIUM CHLORIDE 0.9 % IV BOLUS (SEPSIS)
500.0000 mL | Freq: Once | INTRAVENOUS | Status: AC
  Administered 2017-03-09: 500 mL via INTRAVENOUS

## 2017-03-09 NOTE — Consult Note (Signed)
Reason for Consult:left shoulder pain Referring Physician: Irine Seal, MD  Christine Gay is an 76 y.o. female.  HPI: Yesterday patient was seen in the ER for left sided chest pain/arm pain without injury or trauma.  she has a history of coronary artery disease-status post coronary artery bypass grafting. She had an unremarkable heart catheterization in January, 2018. She has a history of complete heart block and is status post pacemaker placement in January 2018.  Medicine and Cardiology r/o cardiac cause of pain.  Shoulder xray taken in the ED did show moderate glenohumeral OA.  She also describes left sided neck pain and pain with active extension of the neck which radiates down the left arm to the elbow but not past.  Also describes occasional numbness/tingling in the left hand.    Past Medical History:  Diagnosis Date  . Anemia    a. Noted on labs 04/2013.  . Anginal pain ([place])   . Arthritis    "legs" (06/10/2014)  . CAD (coronary artery disease)    a. s/p CABG 1992, b. s/p PCI several years ago; c. LHC 11/08: L-LAD ok, S-OM1 ok, S-OM2 CTO, prox to mid CFX 70% (unchanged), RCA non-dominant, occluded;  d.  Echo 2/11: EF 55%, mod LVH, Ao sclerosis;  e. Echo 6/14: mild LVH, mild focal basal sept hypertrophy, EF 55-65%, inf HK, Mild AI, mild MR, mild BAE;  f. Myoview 7/14: int risk-> cath 04/2013 s/p PTCA/DES to prox Cx 05/04/13.  . Chronic diastolic CHF (congestive heart failure) ([place])   . GERD (gastroesophageal reflux disease)   . Glaucoma   . High cholesterol   . Hypertension   . Hypokalemia 02/2017  . Hypothyroidism   . LBBB (left bundle branch block)    chronic  . Myocardial infarction ([place]) 03/1991 X 2; 07/1991  . Obesity   . Seizures ([place])    "I'm on Dilantin" (06/10/2014)  . Sickle cell trait ([place])   . Type II diabetes mellitus ([place])     Past Surgical History:  Procedure Laterality Date  . CARDIAC CATHETERIZATION     "several;  they went up to check"  . CARDIAC  CATHETERIZATION N/A 09/28/2016   Procedure: Left Heart Cath and Coronary Angiography;  Surgeon: Peter M Martinique, MD;  Location: [facility];  Service: Cardiovascular;  Laterality: N/A;  . CATARACT EXTRACTION W/ INTRAOCULAR LENS  IMPLANT, BILATERAL Bilateral   . CHOLECYSTECTOMY    . COLONOSCOPY N/A 06/12/2014   Procedure: COLONOSCOPY;  Surgeon: Cleotis Nipper, MD;  Location: [facility] ENDOSCOPY;  Service: Endoscopy;  Laterality: N/A;  . CORONARY ANGIOPLASTY WITH STENT PLACEMENT  ? date; 05/04/2013   ? location; DES  to [place]  . CORONARY ARTERY BYPASS GRAFT  07/1991   "CABG X 3"  . EP IMPLANTABLE DEVICE N/A 09/29/2016   Procedure: Pacemaker Implant;  Surgeon: Will Meredith Leeds, MD;  Location: [facility];  Service: Cardiovascular;  Laterality: N/A;  . ESOPHAGOGASTRODUODENOSCOPY N/A 06/12/2014   Procedure: ESOPHAGOGASTRODUODENOSCOPY (EGD);  Surgeon: Cleotis Nipper, MD;  Location: [facility] ENDOSCOPY;  Service: Endoscopy;  Laterality: N/A;  . LEFT AND RIGHT HEART CATHETERIZATION WITH CORONARY ANGIOGRAM N/A 05/04/2013   Procedure: LEFT AND RIGHT HEART CATHETERIZATION WITH CORONARY ANGIOGRAM;  Surgeon: Larey Dresser, MD;  Location: [facility] CATH LAB;  Service: Cardiovascular;  Laterality: N/A;  . PERCUTANEOUS STENT INTERVENTION  05/04/2013   Procedure: PERCUTANEOUS STENT INTERVENTION;  Surgeon: Larey Dresser, MD;  Location: [facility] CATH LAB;  Service: Cardiovascular;;  . TUBAL  LIGATION Bilateral 1969    Family History  Problem Relation Age of Onset  . Diabetes Mother     Social History:  reports that she has never smoked. She has never used smokeless tobacco. She reports that she does not drink alcohol or use drugs.  Allergies:  Allergies  Allergen Reactions  . Meperidine Hcl Other (See Comments)    Demerol - mental status changes    . Nitroglycerin Other (See Comments)    Patch and Cream only - blisters  . Morphine Palpitations    Medications: I have reviewed the patient's current  medications.  Results for orders placed or performed during the hospital encounter of 03/08/17 (from the past 48 hour(s))  Basic metabolic panel     Status: Abnormal   Collection Time: 03/07/17  9:03 PM  Result Value Ref Range   Sodium 138 135 - 145 mmol/L   Potassium 3.9 3.5 - 5.1 mmol/L   Chloride 105 101 - 111 mmol/L   CO2 23 22 - 32 mmol/L   Glucose, Bld 92 65 - 99 mg/dL   BUN 14 6 - 20 mg/dL   Creatinine, Ser 0.99 0.44 - 1.00 mg/dL   Calcium 6.3 (LL) 8.9 - 10.3 mg/dL    Comment: REPEATED TO VERIFY CRITICAL RESULT CALLED TO, READ BACK BY AND VERIFIED WITH: K FIELDS,RN [id number] 03/07/2017 WBOND    GFR calc non Af Amer 54 (L) >60 mL/min   GFR calc Af Amer >60 >60 mL/min    Comment: (NOTE) The eGFR has been calculated using the CKD EPI equation. This calculation has not been validated in all clinical situations. eGFR's persistently <60 mL/min signify possible Chronic Kidney Disease.    Anion gap 10 5 - 15  CBC     Status: Abnormal   Collection Time: 03/07/17  9:03 PM  Result Value Ref Range   WBC 7.5 4.0 - 10.5 K/uL   RBC 3.70 (L) 3.87 - 5.11 MIL/uL   Hemoglobin 11.9 (L) 12.0 - 15.0 g/dL   HCT 35.4 (L) 36.0 - 46.0 %   MCV 95.7 78.0 - 100.0 fL   MCH 32.2 26.0 - 34.0 pg   MCHC 33.6 30.0 - 36.0 g/dL   RDW 12.2 11.5 - 15.5 %   Platelets 163 150 - 400 K/uL  Magnesium     Status: None   Collection Time: 03/07/17  9:03 PM  Result Value Ref Range   Magnesium 2.0 1.7 - 2.4 mg/dL  I-stat troponin, ED     Status: None   Collection Time: 03/07/17  9:13 PM  Result Value Ref Range   Troponin i, poc 0.00 0.00 - 0.08 ng/mL   Comment 3            Comment: Due to the release kinetics of cTnI, a negative result within the first hours of the onset of symptoms does not rule out myocardial infarction with certainty. If myocardial infarction is still suspected, repeat the test at appropriate intervals.   Hepatic function panel     Status: None   Collection Time: 03/08/17  2:23 AM   Result Value Ref Range   Total Protein 7.3 6.5 - 8.1 g/dL   Albumin 3.7 3.5 - 5.0 g/dL   AST 23 15 - 41 U/L   ALT 16 14 - 54 U/L   Alkaline Phosphatase 116 38 - 126 U/L   Total Bilirubin 0.6 0.3 - 1.2 mg/dL   Bilirubin, Direct 0.1 0.1 - 0.5 mg/dL   Indirect Bilirubin 0.5  0.3 - 0.9 mg/dL  VITAMIN D 25 Hydroxy (Vit-D Deficiency, Fractures)     Status: Abnormal   Collection Time: 03/08/17  2:23 AM  Result Value Ref Range   Vit D, 25-Hydroxy 5.9 (L) 30.0 - 100.0 ng/mL    Comment: (NOTE) Vitamin D deficiency has been defined by the [place] [facility] practice guideline as a level of serum 25-OH vitamin D less than 20 ng/mL (1,2). The Endocrine Society went on to further define vitamin D insufficiency as a level between 21 and 29 ng/mL (2). 1. IOM (Institute of Medicine). 2010. Dietary reference   intakes for calcium and D. [place]: The   [employer]. 2. Holick MF, Binkley Merlin, Bischoff-Ferrari HA, et al.   Evaluation, treatment, and prevention of vitamin D   deficiency: an Endocrine Society clinical practice   guideline. JCEM. 2011 Jul; 96(7):1911-30. Performed At: [facility] [place], [place] [id number] Lindon Romp MD OI:[id number]   CBC with Differential     Status: Abnormal   Collection Time: 03/08/17  2:23 AM  Result Value Ref Range   WBC 9.2 4.0 - 10.5 K/uL   RBC 3.75 (L) 3.87 - 5.11 MIL/uL   Hemoglobin 12.0 12.0 - 15.0 g/dL   HCT 35.9 (L) 36.0 - 46.0 %   MCV 95.7 78.0 - 100.0 fL   MCH 32.0 26.0 - 34.0 pg   MCHC 33.4 30.0 - 36.0 g/dL   RDW 12.3 11.5 - 15.5 %   Platelets 147 (L) 150 - 400 K/uL   Neutrophils Relative % 66 %   Neutro Abs 6.1 1.7 - 7.7 K/uL   Lymphocytes Relative 25 %   Lymphs Abs 2.3 0.7 - 4.0 K/uL   Monocytes Relative 7 %   Monocytes Absolute 0.6 0.1 - 1.0 K/uL   Eosinophils Relative 2 %   Eosinophils Absolute 0.2 0.0 - 0.7 K/uL   Basophils Relative 0 %   Basophils Absolute  0.0 0.0 - 0.1 K/uL  I-Stat Troponin, ED (not at [place])     Status: None   Collection Time: 03/08/17  2:34 AM  Result Value Ref Range   Troponin i, poc 0.02 0.00 - 0.08 ng/mL   Comment 3            Comment: Due to the release kinetics of cTnI, a negative result within the first hours of the onset of symptoms does not rule out myocardial infarction with certainty. If myocardial infarction is still suspected, repeat the test at appropriate intervals.   PTH, intact and calcium     Status: Abnormal   Collection Time: 03/08/17  5:25 AM  Result Value Ref Range   PTH 325 (H) 15 - 65 pg/mL   Calcium, Total (PTH) 6.8 (LL) 8.7 - 10.3 mg/dL    Comment: **Verified by repeat analysis**   PTH Comment     Comment: (NOTE) Interpretation                 Intact PTH    Calcium                                (pg/mL)      (mg/dL) Normal                          15 - 65     8.6 - 10.2 Primary Hyperparathyroidism         >  65          >10.2 Secondary Hyperparathyroidism       >65          <10.2 Non-Parathyroid Hypercalcemia       <65          >10.2 Hypoparathyroidism                  <15          < 8.6 Non-Parathyroid Hypocalcemia    15 - 65          < 8.6 Test(s) Calcium called to [place] on 03/09/2017 at 02:38 E ST Performed At: [facility] [place], [place] [id number] Lindon Romp MD [id number]   Phosphorus     Status: None   Collection Time: 03/08/17  5:25 AM  Result Value Ref Range   Phosphorus 2.6 2.5 - 4.6 mg/dL  Basic metabolic panel     Status: Abnormal   Collection Time: 03/08/17  5:25 AM  Result Value Ref Range   Sodium 137 135 - 145 mmol/L   Potassium 3.4 (L) 3.5 - 5.1 mmol/L   Chloride 104 101 - 111 mmol/L   CO2 24 22 - 32 mmol/L   Glucose, Bld 96 65 - 99 mg/dL   BUN 13 6 - 20 mg/dL   Creatinine, Ser 0.90 0.44 - 1.00 mg/dL   Calcium 6.7 (L) 8.9 - 10.3 mg/dL   GFR calc non Af Amer >60 >60 mL/min   GFR calc Af Amer >60 >60 mL/min     Comment: (NOTE) The eGFR has been calculated using the CKD EPI equation. This calculation has not been validated in all clinical situations. eGFR's persistently <60 mL/min signify possible Chronic Kidney Disease.    Anion gap 9 5 - 15  Troponin I (q 6hr x 3)     Status: None   Collection Time: 03/08/17  5:25 AM  Result Value Ref Range   Troponin I <0.03 <0.03 ng/mL  Glucose, capillary     Status: None   Collection Time: 03/08/17  5:25 AM  Result Value Ref Range   Glucose-Capillary 90 65 - 99 mg/dL  Phenytoin level, total     Status: None   Collection Time: 03/08/17  7:33 AM  Result Value Ref Range   Phenytoin Lvl 14.4 10.0 - 20.0 ug/mL  Troponin I (q 6hr x 3)     Status: None   Collection Time: 03/08/17 10:24 AM  Result Value Ref Range   Troponin I <0.03 <0.03 ng/mL  Protime-INR     Status: Abnormal   Collection Time: 03/08/17 10:24 AM  Result Value Ref Range   Prothrombin Time 24.3 (H) 11.4 - 15.2 seconds   INR 2.14   Glucose, capillary     Status: None   Collection Time: 03/08/17 11:03 AM  Result Value Ref Range   Glucose-Capillary 91 65 - 99 mg/dL   Comment 1 Notify RN    Comment 2 Document in Chart   Glucose, capillary     Status: None   Collection Time: 03/08/17  4:10 PM  Result Value Ref Range   Glucose-Capillary 89 65 - 99 mg/dL   Comment 1 Notify RN    Comment 2 Document in Chart   Troponin I (q 6hr x 3)     Status: None   Collection Time: 03/08/17  4:13 PM  Result Value Ref Range   Troponin I <0.03 <0.03 ng/mL  Glucose, capillary  Status: Abnormal   Collection Time: 03/08/17 10:27 PM  Result Value Ref Range   Glucose-Capillary 110 (H) 65 - 99 mg/dL  CBC     Status: Abnormal   Collection Time: 03/09/17  2:34 AM  Result Value Ref Range   WBC 6.4 4.0 - 10.5 K/uL   RBC 3.33 (L) 3.87 - 5.11 MIL/uL   Hemoglobin 10.6 (L) 12.0 - 15.0 g/dL   HCT 31.8 (L) 36.0 - 46.0 %   MCV 95.5 78.0 - 100.0 fL   MCH 31.8 26.0 - 34.0 pg   MCHC 33.3 30.0 - 36.0 g/dL   RDW  12.1 11.5 - 15.5 %   Platelets 157 150 - 400 K/uL  Protime-INR     Status: Abnormal   Collection Time: 03/09/17  2:34 AM  Result Value Ref Range   Prothrombin Time 23.5 (H) 11.4 - 15.2 seconds   INR 2.26   Basic metabolic panel     Status: Abnormal   Collection Time: 03/09/17  2:34 AM  Result Value Ref Range   Sodium 138 135 - 145 mmol/L   Potassium 3.7 3.5 - 5.1 mmol/L   Chloride 107 101 - 111 mmol/L   CO2 23 22 - 32 mmol/L   Glucose, Bld 97 65 - 99 mg/dL   BUN 11 6 - 20 mg/dL   Creatinine, Ser 0.88 0.44 - 1.00 mg/dL   Calcium 6.6 (L) 8.9 - 10.3 mg/dL   GFR calc non Af Amer >60 >60 mL/min   GFR calc Af Amer >60 >60 mL/min    Comment: (NOTE) The eGFR has been calculated using the CKD EPI equation. This calculation has not been validated in all clinical situations. eGFR's persistently <60 mL/min signify possible Chronic Kidney Disease.    Anion gap 8 5 - 15  Glucose, capillary     Status: None   Collection Time: 03/09/17  6:24 AM  Result Value Ref Range   Glucose-Capillary 88 65 - 99 mg/dL  Magnesium     Status: None   Collection Time: 03/09/17  8:25 AM  Result Value Ref Range   Magnesium 2.2 1.7 - 2.4 mg/dL  Vitamin B12     Status: None   Collection Time: 03/09/17  8:25 AM  Result Value Ref Range   Vitamin B-12 227 180 - 914 pg/mL    Comment: (NOTE) This assay is not validated for testing neonatal or myeloproliferative syndrome specimens for Vitamin B12 levels.   Folate     Status: None   Collection Time: 03/09/17  8:25 AM  Result Value Ref Range   Folate 8.5 >5.9 ng/mL  Iron and TIBC     Status: Abnormal   Collection Time: 03/09/17  8:25 AM  Result Value Ref Range   Iron 108 28 - 170 ug/dL   TIBC 244 (L) 250 - 450 ug/dL   Saturation Ratios 44 (H) 10.4 - 31.8 %   UIBC 136 ug/dL  Ferritin     Status: None   Collection Time: 03/09/17  8:25 AM  Result Value Ref Range   Ferritin 66 11 - 307 ng/mL  Reticulocytes     Status: Abnormal   Collection Time: 03/09/17   8:25 AM  Result Value Ref Range   Retic Ct Pct 1.4 0.4 - 3.1 %   RBC. 3.81 (L) 3.87 - 5.11 MIL/uL   Retic Count, Manual 53.3 19.0 - 186.0 K/uL  Glucose, capillary     Status: Abnormal   Collection Time: 03/09/17 11:13 AM  Result  Value Ref Range   Glucose-Capillary 172 (H) 65 - 99 mg/dL   Comment 1 Notify RN    Comment 2 Document in Chart     Dg Chest 2 View  Result Date: 03/07/2017 CLINICAL DATA:  Acute onset of left shoulder pain, radiating to the left arm and neck. Initial encounter. EXAM: CHEST  2 VIEW COMPARISON:  Chest radiograph performed 09/30/2016 FINDINGS: The lungs are well-aerated and clear. There is no evidence of focal opacification, pleural effusion or pneumothorax. The heart is mildly enlarged. The patient is status post median sternotomy. A pacemaker is noted at the left chest wall, with leads ending at the right atrium and right ventricle. No acute osseous abnormalities are seen. Scattered calcification is seen along the proximal abdominal aorta. IMPRESSION: 1. No acute cardiopulmonary process seen. 2. Mild cardiomegaly. 3. Scattered aortic atherosclerosis. Electronically Signed   By: Garald Balding M.D.   On: 03/07/2017 21:51   Ct Cervical Spine Wo Contrast  Result Date: 03/08/2017 CLINICAL DATA:  Acute onset of neck pain, radiating to the left shoulder and left chest. Initial encounter. EXAM: CT CERVICAL SPINE WITHOUT CONTRAST TECHNIQUE: Multidetector CT imaging of the cervical spine was performed without intravenous contrast. Multiplanar CT image reconstructions were also generated. COMPARISON:  None. FINDINGS: Alignment: Normal. Skull base and vertebrae: No acute fracture. No primary bone lesion or focal pathologic process. Soft tissues and spinal canal: No prevertebral fluid or swelling. No visible canal hematoma. Disc levels: Intervertebral disc spaces are preserved. The bony foramina are grossly unremarkable. Upper chest: Calcification is noted at the carotid bifurcations  bilaterally. The visualized lung bases are clear. The thyroid gland is not well characterized. Other: No additional soft tissue abnormalities are seen. Cerebellar atrophy is noted. IMPRESSION: 1. No evidence of fracture or subluxation along the cervical spine. 2. Calcification at the carotid bifurcations bilaterally. Carotid ultrasound could be considered for further evaluation, when and as deemed clinically appropriate. 3. Cerebellar atrophy noted Electronically Signed   By: Garald Balding M.D.   On: 03/08/2017 04:55   Dg Shoulder Left  Result Date: 03/07/2017 CLINICAL DATA:  Shoulder pain radiating to left arm and neck since yesterday. EXAM: LEFT SHOULDER - 2+ VIEW COMPARISON:  09/30/2016 CXR which includes the left shoulder FINDINGS: There is no evidence of acute fracture or dislocation. There is slight joint space narrowing of the glenohumeral articulation with minimal degenerate change noted. AC joint is maintained. The patient is status post CABG with left-sided pacemaker apparatus projecting the axilla. The included ribs and lung are unremarkable. IMPRESSION: No acute fracture nor dislocation of the left shoulder. Osteoarthritis of the left glenohumeral joint. Electronically Signed   By: Ashley Royalty M.D.   On: 03/07/2017 21:51    Review of Systems  Constitutional: Negative for chills and fever.  Respiratory: Negative for shortness of breath.   Cardiovascular: Positive for chest pain.  Gastrointestinal: Negative for nausea and vomiting.  Genitourinary: Negative for dysuria.  Musculoskeletal: Positive for joint pain and neck pain. Negative for falls.  Skin: Negative for rash.  Neurological: Negative for dizziness.   Blood pressure 140/62, pulse (!) 58, temperature 97.4 F (36.3 C), temperature source Oral, resp. rate 18, height 5' (1.524 m), weight 79.3 kg (174 lb 14.4 oz), SpO2 100 %. Physical Exam  Constitutional: She is oriented to person, place, and time. She appears well-developed and  well-nourished. No distress.  HENT:  Head: Normocephalic and atraumatic.  Neck: Normal range of motion. Neck supple.  Cardiovascular: Intact distal pulses.  Respiratory: Effort normal. No respiratory distress.  Musculoskeletal:       Left shoulder: She exhibits decreased range of motion, tenderness, bony tenderness, pain and decreased strength.  Left shoulder painful PROM particularly with IR and abduction.  Mild weakness with resisted abduction and rotation, positive impingement with hawkins testing and hyperadduction of the arm across the chest.    Left sided neck pain with active extension no midline TTP.    Neurological: She is alert and oriented to person, place, and time.  Skin: Skin is warm and dry. No rash noted. No erythema.  Psychiatric: She has a normal mood and affect. Her behavior is normal.    Assessment/Plan: Left shoulder moderate glenohumeral OA cannot r/o RTC tear. Also possible cervical strain with left sided symptoms although CT unremarkable for radiculopathy.    Had a long discussion with the patient today possible the pain could be coming from the left shoulder OA.  Obviously not a good candidate for po NSAIDs due to recent cardiac history.  She has recent bilat knee cortisone injections in our outpatient office and did very well with these with good relief.  We discussed risks and benefits of left shoulder asp/inj today and she wishes to proceed see procedure note below.  She may follow up in our office if shoulder or neck symptoms persist in 1-2 weeks.  Did warm of the possible elevated blood glucose with the injection and her diabetes she said if did not really effect it the last time.  PROCEDURE:  Patient seated in the chair left arm draped to the side.  Proper injection identified using a posterior subacromial approach cleaned and prepped with a betadine swab.  Shoulder then aspirated injected using a mixture of celestone and 0.25%plain marcaine 66m and 245m respectively.  Patient tolerated procedure well without complication  ChChriss Czar/13/2018, 3:24 PM

## 2017-03-09 NOTE — Progress Notes (Signed)
Assumed care from off going RN @ [phone] ; patient has c/o's left shoulder pain; BP low; bolus given; MD notified; Ortho MD will be by later. Pain medication given for pain 9 on pain scale 0-10; patient up in chair with call bell in reach.

## 2017-03-09 NOTE — Progress Notes (Signed)
PROGRESS NOTE    Christine Gay  ION:[id number] DOB: 10-26-1940 DOA: 03/08/2017 PCP: Seward Carol, MD   Brief Narrative:  Patient is a 76 year old female history of coronary artery disease status post CABG with stenting with recent cardiac catheterization which was unremarkable in general 2008, complete heart block status post PPM generator 2018, paroxysmal atrial fibrillation, hypertension, hypothyroidism, diabetes presented to the ED with complaints of persistent neck pain chest pain and pain radiating to the left shoulder. Cardiac enzymes cycled were negative. Patient seen in consultation by cardiology were recommended starting patient on Ranexa 500 mg twice daily and titrating fairly quickly as needed. Patient also noted with complaints of left shoulder pain likely osteoarthritis. Orthopedics consulted.   Assessment & Plan:   Principal Problem:   Chest pain Active Problems:   Shoulder pain, left   Hypothyroidism   HYPERTENSION, BENIGN   CAD, ARTERY BYPASS GRAFT   Hypotension   Chronic diastolic CHF (congestive heart failure) (HCC)   Microcytic hypochromic anemia   Complete heart block (HCC)   Atrial fibrillation (HCC)   Muscle spasm   Hypocalcemia   Seizure ([place])  #1 chest pain Patient with significant history of coronary artery disease status post CABG, history of complete heart block status post permanent pacemaker placement 09/2016 presenting with complaints of chest pain. Patient states improvement in substernal chest pain however when trying to comb her hair this morning had some left shoulder and left upper extremity pain that was severe and worsened with movement. No shortness of breath. Cardiac enzymes negative 3. EKG with no ischemic changes noted. Patient started on Ranexa 500 mg twice a day per cardiology recommending up titration quickly if needed as patient's Dilantin will increase metabolic is above Ranexa. Per cardiology no further workup needed at this time and  patient will need to follow-up with her primary cardiologist on discharge.  #2 left shoulder pain   likely significant to left shoulder osteoarthritis as noted on plain films of the left shoulder. CT of the C-spine with no acute abnormalities accounting for patient's left shoulder pain. Patient states recently had some cortisone injections in her knees by her orthopedist, Dr. French Ana with symptomatic improvement. Will consult with orthopedics for further evaluation and assessment and possibility of possible cortisone injection in the left shoulder for symptomatic improvement.  #3 hypotension Patient noted to be hypotensive this morning with repeat manual blood pressure with systolic blood pressures in the 80s. Patient on multiple cardiac medications and antihypertensive regimen that could lead to her hypotension. We'll give a bolus of 500 mL normal saline 1. Will decrease patient's Imdur to 60 mg by mouth twice a day. Monitor closely.  #4 atrial fibrillation Currently rate controlled on Coreg. INR at 2.06. Coumadin for anticoagulation.  #5 hypocalcemia/vitamin D deficiency Likely secondary to vitamin D deficiency. Vitamin D 25-hydroxy level of 5.9. Patient received a dose of IV calcium gluconate. Check a magnesium level. Change calcium carbonate tablets 2 4 times a day. Add vitamin D 1000 units daily. Outpatient follow-up.  #6 hypothyroidism Continue home dose Synthroid.  #7 coronary artery disease status post CABG/complete heart block status post PPM/chronic diastolic heart failure Stable. Continue home regimen of Coreg, Zetia, Crestor, Coumadin. Decrease Imdur dose to 60 mg twice daily. Ranexa started per cardiology. Outpatient follow-up.  #8 hyperlipidemia Continue Crestor and Zetia.  #9 diabetes mellitus 2 Check a hemoglobin A1c. Continue sliding scale insulin.  #10 history of seizures Stable. No seizure activity noted. Continue Dilantin.  #11 low vitamin B-12 levels  Will place on  IM vitamin B-12. Outpatient follow-up.    DVT prophylaxis: On Coumadin. Code Status: Full Family Communication: Updated patient. No family at bedside. Disposition Plan: Home once hypotension has resolved, improvement with right shoulder pain and after evaluation by orthopedics hopefully in the next 24-48 hours.   Consultants:   Orthopedics pending  Cardiology: Dr. Acie Fredrickson 03/08/2017  Procedures:   Chest x-ray 03/07/2017  Plain films of the left shoulder 03/07/2017  CT C-spine 03/08/2017  Antimicrobials:   None   Subjective: Patient complaining of pain in the left upper extremity around the shoulder and in the armpit region after trying to comb her hair this morning. Patient complain of significant pain with movement. No shortness of breath.  Objective: Vitals:   03/09/17 0340 03/09/17 1126 03/09/17 1128 03/09/17 1406  BP: (!) 95/38 (!) 84/58 (!) 84/56 140/62  Pulse: 63   (!) 58  Resp: 16   18  Temp: 98.2 F (36.8 C)   97.4 F (36.3 C)  TempSrc: Oral   Oral  SpO2: 94%   100%  Weight:      Height:        Intake/Output Summary (Last 24 hours) at 03/09/17 1920 Last data filed at 03/09/17 1856  Gross per 24 hour  Intake              340 ml  Output                0 ml  Net              340 ml   Filed Weights   03/08/17 0507  Weight: 79.3 kg (174 lb 14.4 oz)    Examination:  General exam: Appears calm and comfortable  Respiratory system: Clear to auscultation. Respiratory effort normal. Cardiovascular system: S1 & S2 heard, RRR. No JVD, murmurs, rubs, gallops or clicks. No pedal edema. Gastrointestinal system: Abdomen is nondistended, soft and nontender. No organomegaly or masses felt. Normal bowel sounds heard. Central nervous system: Alert and oriented. No focal neurological deficits. Extremities: Significant pain left shoulder with range of motion. Symmetric 5 x 5 power. Skin: No rashes, lesions or ulcers Psychiatry: Judgement and insight appear normal.  Mood & affect appropriate.     Data Reviewed: I have personally reviewed following labs and imaging studies  CBC:  Recent Labs Lab 03/07/17 2103 03/08/17 0223 03/09/17 0234  WBC 7.5 9.2 6.4  NEUTROABS  --  6.1  --   HGB 11.9* 12.0 10.6*  HCT 35.4* 35.9* 31.8*  MCV 95.7 95.7 95.5  PLT 163 147* 409   Basic Metabolic Panel:  Recent Labs Lab 03/07/17 2103 03/08/17 0525 03/09/17 0234 03/09/17 0825  NA 138 137 138  --   K 3.9 3.4* 3.7  --   CL 105 104 107  --   CO2 23 24 23   --   GLUCOSE 92 96 97  --   BUN 14 13 11   --   CREATININE 0.99 0.90 0.88  --   CALCIUM 6.3* 6.7*  6.8* 6.6*  --   MG 2.0  --   --  2.2  PHOS  --  2.6  --   --    GFR: Estimated Creatinine Clearance: 50.7 mL/min (by C-G formula based on SCr of 0.88 mg/dL). Liver Function Tests:  Recent Labs Lab 03/08/17 0223  AST 23  ALT 16  ALKPHOS 116  BILITOT 0.6  PROT 7.3  ALBUMIN 3.7   No results for input(s): LIPASE, AMYLASE  in the last 168 hours. No results for input(s): AMMONIA in the last 168 hours. Coagulation Profile:  Recent Labs Lab 03/04/17 1053 03/08/17 1024 03/09/17 0234  INR 2.1 2.14 2.06   Cardiac Enzymes:  Recent Labs Lab 03/08/17 0525 03/08/17 1024 03/08/17 1613  TROPONINI <0.03 <0.03 <0.03   BNP (last 3 results)  Recent Labs  11/29/16 0936  PROBNP 789*   HbA1C: No results for input(s): HGBA1C in the last 72 hours. CBG:  Recent Labs Lab 03/08/17 1610 03/08/17 2227 03/09/17 0624 03/09/17 1113 03/09/17 1628  GLUCAP 89 110* 88 172* 71   Lipid Profile: No results for input(s): CHOL, HDL, LDLCALC, TRIG, CHOLHDL, LDLDIRECT in the last 72 hours. Thyroid Function Tests: No results for input(s): TSH, T4TOTAL, FREET4, T3FREE, THYROIDAB in the last 72 hours. Anemia Panel:  Recent Labs  03/09/17 0825  VITAMINB12 227  FOLATE 8.5  FERRITIN 66  TIBC 244*  IRON 108  RETICCTPCT 1.4   Sepsis Labs: No results for input(s): PROCALCITON, LATICACIDVEN in the  last 168 hours.  No results found for this or any previous visit (from the past 240 hour(s)).       Radiology Studies: Dg Chest 2 View  Result Date: 03/07/2017 CLINICAL DATA:  Acute onset of left shoulder pain, radiating to the left arm and neck. Initial encounter. EXAM: CHEST  2 VIEW COMPARISON:  Chest radiograph performed 09/30/2016 FINDINGS: The lungs are well-aerated and clear. There is no evidence of focal opacification, pleural effusion or pneumothorax. The heart is mildly enlarged. The patient is status post median sternotomy. A pacemaker is noted at the left chest wall, with leads ending at the right atrium and right ventricle. No acute osseous abnormalities are seen. Scattered calcification is seen along the proximal abdominal aorta. IMPRESSION: 1. No acute cardiopulmonary process seen. 2. Mild cardiomegaly. 3. Scattered aortic atherosclerosis. Electronically Signed   By: Garald Balding M.D.   On: 03/07/2017 21:51   Ct Cervical Spine Wo Contrast  Result Date: 03/08/2017 CLINICAL DATA:  Acute onset of neck pain, radiating to the left shoulder and left chest. Initial encounter. EXAM: CT CERVICAL SPINE WITHOUT CONTRAST TECHNIQUE: Multidetector CT imaging of the cervical spine was performed without intravenous contrast. Multiplanar CT image reconstructions were also generated. COMPARISON:  None. FINDINGS: Alignment: Normal. Skull base and vertebrae: No acute fracture. No primary bone lesion or focal pathologic process. Soft tissues and spinal canal: No prevertebral fluid or swelling. No visible canal hematoma. Disc levels: Intervertebral disc spaces are preserved. The bony foramina are grossly unremarkable. Upper chest: Calcification is noted at the carotid bifurcations bilaterally. The visualized lung bases are clear. The thyroid gland is not well characterized. Other: No additional soft tissue abnormalities are seen. Cerebellar atrophy is noted. IMPRESSION: 1. No evidence of fracture or  subluxation along the cervical spine. 2. Calcification at the carotid bifurcations bilaterally. Carotid ultrasound could be considered for further evaluation, when and as deemed clinically appropriate. 3. Cerebellar atrophy noted Electronically Signed   By: Garald Balding M.D.   On: 03/08/2017 04:55   Dg Shoulder Left  Result Date: 03/07/2017 CLINICAL DATA:  Shoulder pain radiating to left arm and neck since yesterday. EXAM: LEFT SHOULDER - 2+ VIEW COMPARISON:  09/30/2016 CXR which includes the left shoulder FINDINGS: There is no evidence of acute fracture or dislocation. There is slight joint space narrowing of the glenohumeral articulation with minimal degenerate change noted. AC joint is maintained. The patient is status post CABG with left-sided pacemaker apparatus projecting the axilla.  The included ribs and lung are unremarkable. IMPRESSION: No acute fracture nor dislocation of the left shoulder. Osteoarthritis of the left glenohumeral joint. Electronically Signed   By: Ashley Royalty M.D.   On: 03/07/2017 21:51        Scheduled Meds: . calcium carbonate  1 tablet Oral QID  . carvedilol  3.125 mg Oral BID WC  . cholecalciferol  1,000 Units Oral Daily  . cyanocobalamin  1,000 mcg Intramuscular Daily  . cycloSPORINE  1 drop Both Eyes BID  . ezetimibe  10 mg Oral Daily  . ferrous sulfate  325 mg Oral BID WC  . insulin aspart  0-9 Units Subcutaneous TID WC  . isosorbide mononitrate  60 mg Oral BID  . levothyroxine  100 mcg Oral QAC breakfast  . pantoprazole  40 mg Oral Daily  . phenytoin  200 mg Oral Daily   And  . phenytoin  100 mg Oral QHS  . ranolazine  500 mg Oral BID  . rosuvastatin  40 mg Oral Daily  . timolol  1 drop Both Eyes Daily  . Warfarin - Pharmacist Dosing Inpatient   Does not apply q1800   Continuous Infusions:    LOS: 0 days    Time spent: 71 minutes    THOMPSON,DANIEL, MD [facility] Pager [phone]  If 7PM-7AM, please contact  night-coverage [email or web address] Password [email or web address] 03/09/2017, 7:20 PM

## 2017-03-09 NOTE — Progress Notes (Signed)
[facility] for Warfarin Indication: atrial fibrillation  Allergies  Allergen Reactions  . Meperidine Hcl Other (See Comments)    Demerol - mental status changes    . Nitroglycerin Other (See Comments)    Patch and Cream only - blisters  . Morphine Palpitations    Patient Measurements: Weight: 174 lb 14.4 oz (79.3 kg)  Vital Signs: Temp: 98.2 F (36.8 C) (06/13 0340) Temp Source: Oral (06/13 0340) BP: 95/38 (06/13 0340) Pulse Rate: 63 (06/13 0340)  Labs:  Recent Labs  03/07/17 2103 03/08/17 0223 03/08/17 0525 03/08/17 1024 03/08/17 1613 03/09/17 0234  HGB 11.9* 12.0  --   --   --  10.6*  HCT 35.4* 35.9*  --   --   --  31.8*  PLT 163 147*  --   --   --  157  LABPROT  --   --   --  24.3*  --  23.5*  INR  --   --   --  2.14  --  2.06  CREATININE 0.99  --  0.90  --   --  0.88  TROPONINI  --   --  <0.03 <0.03 <0.03  --     Estimated Creatinine Clearance: 50.7 mL/min (by C-G formula based on SCr of 0.88 mg/dL).   Medical History: Past Medical History:  Diagnosis Date  . Anemia    a. Noted on labs 04/2013.  . Anginal pain ([place])   . Arthritis    "legs" (06/10/2014)  . CAD (coronary artery disease)    a. s/p CABG 1992, b. s/p PCI several years ago; c. LHC 11/08: L-LAD ok, S-OM1 ok, S-OM2 CTO, prox to mid CFX 70% (unchanged), RCA non-dominant, occluded;  d.  Echo 2/11: EF 55%, mod LVH, Ao sclerosis;  e. Echo 6/14: mild LVH, mild focal basal sept hypertrophy, EF 55-65%, inf HK, Mild AI, mild MR, mild BAE;  f. Myoview 7/14: int risk-> cath 04/2013 s/p PTCA/DES to prox Cx 05/04/13.  . Chronic diastolic CHF (congestive heart failure) ([place])   . GERD (gastroesophageal reflux disease)   . Glaucoma   . High cholesterol   . Hypertension   . Hypokalemia 02/2017  . Hypothyroidism   . LBBB (left bundle branch block)    chronic  . Myocardial infarction ([place]) 03/1991 X 2; 07/1991  . Obesity   . Seizures ([place])    "I'm on Dilantin" (06/10/2014)   . Sickle cell trait ([place])   . Type II diabetes mellitus (HCC)     Medications:  Prescriptions Prior to Admission  Medication Sig Dispense Refill Last Dose  . acetaminophen (TYLENOL) 325 MG tablet Take 1-2 tablets (325-650 mg total) by mouth every 4 (four) hours as needed for mild pain.   unk  . carvedilol (COREG) 6.25 MG tablet Take 0.5 tablets (3.125 mg total) by mouth 2 (two) times daily.   03/07/2017 at 1700  . cycloSPORINE (RESTASIS) 0.05 % ophthalmic emulsion Place 1 drop into both eyes 2 (two) times daily.   03/07/2017 at Unknown time  . ezetimibe (ZETIA) 10 MG tablet TAKE 1 TABLET BY MOUTH DAILY. 90 tablet 3 03/07/2017 at Unknown time  . ferrous sulfate 325 (65 FE) MG tablet Take 1 tablet (325 mg total) by mouth 2 (two) times daily with a meal. 90 tablet 0 03/07/2017 at Unknown time  . furosemide (LASIX) 40 MG tablet Take 1.5 tablets (60 mg total) by mouth 2 (two) times daily. 90 tablet 4 03/07/2017 at Unknown time  .  ibandronate (BONIVA) 150 MG tablet Take 150 mg by mouth every 30 (thirty) days. Take in the morning with a full glass of water, on an empty stomach, and do not take anything else by mouth or lie down for the next 30 min.   03/03/2017  . isosorbide mononitrate (IMDUR) 60 MG 24 hr tablet TAKE 2 TABLETS BY MOUTH 2 TIMES A DAY. 966 tablet 3 03/07/2017 at Unknown time  . levothyroxine (SYNTHROID, LEVOTHROID) 100 MCG tablet Take 100 mcg by mouth daily before breakfast.   03/07/2017 at Unknown time  . metFORMIN (GLUCOPHAGE-XR) 500 MG 24 hr tablet Take 500 mg by mouth 2 (two) times daily.    03/07/2017 at Unknown time  . NITROSTAT 0.4 MG SL tablet PLACE 1 TABLET (0.4 MG TOTAL) UNDER THE TONGUE EVERY 5 (FIVE) MINUTES AS NEEDED FOR CHEST PAIN. 25 tablet 11 unk  . pantoprazole (PROTONIX) 40 MG tablet TAKE 1 TABLET BY MOUTH DAILY. 90 tablet 2 03/07/2017 at Unknown time  . phenytoin (DILANTIN) 100 MG ER capsule Take 100-200 mg by mouth 2 (two) times daily. 200 mg in the morning and 100 mg in the  evening   03/07/2017 at Unknown time  . potassium chloride SA (K-DUR,KLOR-CON) 20 MEQ tablet Take 1 tablet (20 mEq total) by mouth 2 (two) times daily. 60 tablet 3 03/07/2017 at Unknown time  . rosuvastatin (CRESTOR) 40 MG tablet TAKE 1 TABLET BY MOUTH DAILY. 90 tablet 2 03/07/2017 at Unknown time  . timolol (TIMOPTIC) 0.5 % ophthalmic solution Place 1 drop into both eyes daily.   6 03/07/2017 at Unknown time  . warfarin (COUMADIN) 5 MG tablet Take 1 to 1.5 tablets daily as directed by the [facility]. (Patient taking differently: Take 5-7.5 mg by mouth daily. Take 1 daily and 1.5 on Friday) 40 tablet 1 03/07/2017 at 1700    Assessment: 76 y.o. female admitted with chest pain, h/o AFib, to continue Coumadin   INR Is therapeutic at 2.06, Hgb low stable, Plt ok.  PTA Warfarin Dose: 7.5mg  Fri and 5mg  AODs  Goal of Therapy:  INR 2-3 Monitor platelets by anticoagulation protocol: Yes   Plan:  Warfarin 5mg  tonight x1 Daily INR Monitor s/sx of bleeding  Andrey Cota. Diona Foley, PharmD, BCPS Clinical Pharmacist [phone] 03/09/2017,7:45 AM

## 2017-03-09 NOTE — Progress Notes (Signed)
Progress Note  Patient Name: Aniylah Avans Gay Date of Encounter: 03/09/2017  Primary Cardiologist:  Aundra Dubin  Primary Electrophysiologist:  Curt Bears  Subjective   Christine Gay is a 76 year old female with a history of coronary artery disease-status post coronary artery bypass grafting. She had an unremarkable heart catheterization in January, 2018. She has a history of complete heart block and is status post pacemaker placement in January 2018. We have been asked to see for evaluation of CP.    slept well last night without any problems but then this morning she went to smooth her hair back with her left arm and developed severe shoulder pain again. She is holding her arm, pain is made worse with moving the arm or when she leans her head back. She denies any SOB.   Inpatient Medications    Scheduled Meds: . calcium carbonate  1 tablet Oral QID  . carvedilol  3.125 mg Oral BID WC  . cholecalciferol  1,000 Units Oral Daily  . cycloSPORINE  1 drop Both Eyes BID  . ezetimibe  10 mg Oral Daily  . ferrous sulfate  325 mg Oral BID WC  . insulin aspart  0-9 Units Subcutaneous TID WC  . isosorbide mononitrate  120 mg Oral BID  . levothyroxine  100 mcg Oral QAC breakfast  . pantoprazole  40 mg Oral Daily  . phenytoin  200 mg Oral Daily   And  . phenytoin  100 mg Oral QHS  . ranolazine  500 mg Oral BID  . rosuvastatin  40 mg Oral Daily  . timolol  1 drop Both Eyes Daily  . warfarin  5 mg Oral ONCE-1800  . Warfarin - Pharmacist Dosing Inpatient   Does not apply q1800   Continuous Infusions:  PRN Meds: acetaminophen **OR** acetaminophen, nitroGLYCERIN, oxyCODONE   Vital Signs    Vitals:   03/08/17 0507 03/08/17 1319 03/08/17 1943 03/09/17 0340  BP: (!) 137/54 (!) 107/51 (!) 123/56 (!) 95/38  Pulse: 71 60 (!) 59 63  Resp: 18 18 18 16   Temp: 97.6 F (36.4 C) 98 F (36.7 C) 97.5 F (36.4 C) 98.2 F (36.8 C)  TempSrc: Oral Oral Oral Oral  SpO2: 100% 100% 98% 94%  Weight:  174 lb 14.4 oz (79.3 kg)     Height: 5' (1.524 m)       Intake/Output Summary (Last 24 hours) at 03/09/17 0844 Last data filed at 03/08/17 2146  Gross per 24 hour  Intake              940 ml  Output             1450 ml  Net             -510 ml   Filed Weights   03/08/17 0507  Weight: 174 lb 14.4 oz (79.3 kg)    Telemetry    Many PVCs,  AV paced rhythm- Personally Reviewed   Physical Exam   GEN: Well nourished, well developed HEENT: normal  Neck: no JVD, carotid bruits, or masses Cardiac: RRR. no murmurs, rubs, or gallops,no edema. Intact distal pulses bilaterally.  Respiratory: clear to auscultation bilaterally, normal work of breathing GI: soft, nontender, nondistended, + BS MS: no deformity or atrophy. Tenderness to left AC joint and insertion of the deltoid that extends to C4, also tender to superior lateral chest wall, pain exacerbated by ranging her shoulder and her neck. Good distal sensation and pulses to left wrist. Skin: warm and dry,  no rash Neuro: Alert and Oriented x 3, Strength and sensation are intact Psych:   Full affect  Labs    Chemistry Recent Labs Lab 03/07/17 2103 03/08/17 0223 03/08/17 0525 03/09/17 0234  NA 138  --  137 138  K 3.9  --  3.4* 3.7  CL 105  --  104 107  CO2 23  --  24 23  GLUCOSE 92  --  96 97  BUN 14  --  13 11  CREATININE 0.99  --  0.90 0.88  CALCIUM 6.3*  --  6.7*  6.8* 6.6*  PROT  --  7.3  --   --   ALBUMIN  --  3.7  --   --   AST  --  23  --   --   ALT  --  16  --   --   ALKPHOS  --  116  --   --   BILITOT  --  0.6  --   --   GFRNONAA 54*  --  >60 >60  GFRAA >60  --  >60 >60  ANIONGAP 10  --  9 8     Hematology Recent Labs Lab 03/07/17 2103 03/08/17 0223 03/09/17 0234  WBC 7.5 9.2 6.4  RBC 3.70* 3.75* 3.33*  HGB 11.9* 12.0 10.6*  HCT 35.4* 35.9* 31.8*  MCV 95.7 95.7 95.5  MCH 32.2 32.0 31.8  MCHC 33.6 33.4 33.3  RDW 12.2 12.3 12.1  PLT 163 147* 157    Cardiac Enzymes Recent Labs Lab  03/08/17 0525 03/08/17 1024 03/08/17 1613  TROPONINI <0.03 <0.03 <0.03    Recent Labs Lab 03/07/17 2113 03/08/17 0234  TROPIPOC 0.00 0.02     BNPNo results for input(s): BNP, PROBNP in the last 168 hours.   DDimer No results for input(s): DDIMER in the last 168 hours.   Radiology    Dg Chest 2 View  Result Date: 03/07/2017 CLINICAL DATA:  Acute onset of left shoulder pain, radiating to the left arm and neck. Initial encounter. EXAM: CHEST  2 VIEW COMPARISON:  Chest radiograph performed 09/30/2016 FINDINGS: The lungs are well-aerated and clear. There is no evidence of focal opacification, pleural effusion or pneumothorax. The heart is mildly enlarged. The patient is status post median sternotomy. A pacemaker is noted at the left chest wall, with leads ending at the right atrium and right ventricle. No acute osseous abnormalities are seen. Scattered calcification is seen along the proximal abdominal aorta. IMPRESSION: 1. No acute cardiopulmonary process seen. 2. Mild cardiomegaly. 3. Scattered aortic atherosclerosis. Electronically Signed   By: Garald Balding M.D.   On: 03/07/2017 21:51   Ct Cervical Spine Wo Contrast  Result Date: 03/08/2017 CLINICAL DATA:  Acute onset of neck pain, radiating to the left shoulder and left chest. Initial encounter. EXAM: CT CERVICAL SPINE WITHOUT CONTRAST TECHNIQUE: Multidetector CT imaging of the cervical spine was performed without intravenous contrast. Multiplanar CT image reconstructions were also generated. COMPARISON:  None. FINDINGS: Alignment: Normal. Skull base and vertebrae: No acute fracture. No primary bone lesion or focal pathologic process. Soft tissues and spinal canal: No prevertebral fluid or swelling. No visible canal hematoma. Disc levels: Intervertebral disc spaces are preserved. The bony foramina are grossly unremarkable. Upper chest: Calcification is noted at the carotid bifurcations bilaterally. The visualized lung bases are clear. The  thyroid gland is not well characterized. Other: No additional soft tissue abnormalities are seen. Cerebellar atrophy is noted. IMPRESSION: 1. No evidence of fracture or subluxation  along the cervical spine. 2. Calcification at the carotid bifurcations bilaterally. Carotid ultrasound could be considered for further evaluation, when and as deemed clinically appropriate. 3. Cerebellar atrophy noted Electronically Signed   By: Garald Balding M.D.   On: 03/08/2017 04:55   Dg Shoulder Left  Result Date: 03/07/2017 CLINICAL DATA:  Shoulder pain radiating to left arm and neck since yesterday. EXAM: LEFT SHOULDER - 2+ VIEW COMPARISON:  09/30/2016 CXR which includes the left shoulder FINDINGS: There is no evidence of acute fracture or dislocation. There is slight joint space narrowing of the glenohumeral articulation with minimal degenerate change noted. AC joint is maintained. The patient is status post CABG with left-sided pacemaker apparatus projecting the axilla. The included ribs and lung are unremarkable. IMPRESSION: No acute fracture nor dislocation of the left shoulder. Osteoarthritis of the left glenohumeral joint. Electronically Signed   By: Ashley Royalty M.D.   On: 03/07/2017 21:51    Cardiac Studies   Echocardiogram  09/29/2016  Study Conclusions  - Left ventricle: The cavity size was normal. Wall thickness was   increased in a pattern of mild LVH. Basal anterolateral severe   hypokinesis, basal inferolateral akinesis. Systolic function was   low normal to mildly reduced. The estimated ejection fraction was   in the range of 50% to 55%. Indeterminant diastolic function. - Aortic valve: There was no stenosis. There was mild   regurgitation. - Mitral valve: There was trivial regurgitation. - Left atrium: The atrium was mildly dilated. - Right ventricle: The cavity size was normal. Systolic function   was mildly reduced. - Tricuspid valve: No complete TR doppler jet so unable to estimate   PA  systolic pressure. - Pulmonary arteries: PA peak pressure: 33 mm Hg (S). - Inferior vena cava: The vessel was normal in size. The   respirophasic diameter changes were in the normal range (>= 50%),   consistent with normal central venous pressure.  Impressions:  - Normal LV size with mild LV hypertrophy. EF 50-55%. Wall motion   abnormalities as noted above. Normal RV size with mildly   decreased systolic function. Mild aortic insufficiency.  Cardiac Catheterization 09/29/2016 Conclusion     There is mild left ventricular systolic dysfunction.  LV end diastolic pressure is mildly elevated.  The left ventricular ejection fraction is 45-50% by visual estimate.  Ost RCA to Prox RCA lesion, 100 %stenosed.  LM lesion, 35 %stenosed.  Prox Cx to Mid Cx lesion, 30 %stenosed.  Ost 1st Mrg to 1st Mrg lesion, 100 %stenosed.  Ost 2nd Mrg to 2nd Mrg lesion, 100 %stenosed.  Prox LAD lesion, 100 %stenosed.  LIMA graft was visualized by angiography and is normal in caliber and anatomically normal.  SVG graft was visualized by angiography and is normal in caliber and anatomically normal.  SVG graft was not visualized due to known occlusion.  Origin to Prox Graft lesion, 100 %stenosed.   1. Left dominant circulation 2. Severe 3 vessel occlusive CAD 3. Patent stent in the proximal to mid LAD 4. Patent SVG to OM1 5. Patent LIMA to LAD 6. Known occlusion of SVG to OM2 7. Mild LV dysfunction EF 45-50%. 8. Mildly elevated LVEDP.  Plan: No new blockage to explain her complete heart block. Continue medical therapy. Plans for permanent pacemaker if HR does not improve.       Patient Profile     Dejah Droessler Menefee is a 76 y.o. female with a hx of PAF, CAD  who is being seen today  for the evaluation of Chest and shoulder pain at the request of  Dr. Wendee Beavers .  Assessment & Plan    1. Chest pain: --- She has native coronary artery disease and many risk factors for cardiac disease.  This admission she has had 3 negative troponins and no EKG changes this admission. The Ranexa 500 mg PO has not helped. I would recommend discontinuing because I think her pain is coming from her shoulder/neck (Will discuss with Dr. Acie Fredrickson). She did well overnight and her pain started again when she raised her left arm to slick her hair back, worse with movement. She has had a negative CT of her cervical spine yesterday. L Shoulder xray also done yesterday shows that she has osteoarthritis of her left glenohumeral joint this may be an arthritis exacerbation.  Kristopher Glee, PA-C  03/09/2017, 8:44 AM    Attending Note:   The patient was seen and examined.  Agree with assessment and plan as noted above.  Changes made to the above note as needed.  Patient seen and independently examined with Delos Haring, PA .   We discussed all aspects of the encounter. I agree with the assessment and plan as stated above.  1. Chest pain :  Difficult to tell where this is coming from Will try Ranexa 500 BID and titrate up fairly quickly ( Her Dilantin will increase Ranexa metabolism and so we will not get as much effect from stardard doses)  It takes several weeks for the benefitial effects  of Ranexa to start   She is stable from a cardiac standpoint  Will sign off. She will follow up with Dr. Curt Bears.    I have spent a total of 40 minutes with patient reviewing hospital  notes , telemetry, EKGs, labs and examining patient as well as establishing an assessment and plan that was discussed with the patient. > 50% of time was spent in direct patient care.    Thayer Headings, Brooke Bonito., MD, [facility] 03/09/2017, 10:44 AM [address],  [place] Pager [phone]

## 2017-03-10 DIAGNOSIS — R072 Precordial pain: Secondary | ICD-10-CM | POA: Diagnosis not present

## 2017-03-10 DIAGNOSIS — I5032 Chronic diastolic (congestive) heart failure: Secondary | ICD-10-CM | POA: Diagnosis not present

## 2017-03-10 DIAGNOSIS — R079 Chest pain, unspecified: Secondary | ICD-10-CM | POA: Diagnosis not present

## 2017-03-10 DIAGNOSIS — I480 Paroxysmal atrial fibrillation: Secondary | ICD-10-CM | POA: Diagnosis not present

## 2017-03-10 LAB — COMPREHENSIVE METABOLIC PANEL
ALBUMIN: 3.1 g/dL — AB (ref 3.5–5.0)
ALT: 13 U/L — AB (ref 14–54)
AST: 19 U/L (ref 15–41)
Alkaline Phosphatase: 102 U/L (ref 38–126)
Anion gap: 6 (ref 5–15)
BUN: 9 mg/dL (ref 6–20)
CHLORIDE: 108 mmol/L (ref 101–111)
CO2: 24 mmol/L (ref 22–32)
CREATININE: 0.86 mg/dL (ref 0.44–1.00)
Calcium: 7.1 mg/dL — ABNORMAL LOW (ref 8.9–10.3)
GFR calc Af Amer: >60 mL/min (ref >60)
GLUCOSE: 146 mg/dL — AB (ref 65–99)
POTASSIUM: 3.9 mmol/L (ref 3.5–5.1)
SODIUM: 138 mmol/L (ref 135–145)
Total Bilirubin: 0.5 mg/dL (ref 0.3–1.2)
Total Protein: 6.5 g/dL (ref 6.5–8.1)

## 2017-03-10 LAB — CBC WITH DIFFERENTIAL/PLATELET
BASOS ABS: 0 10*3/uL (ref 0.0–0.1)
BASOS PCT: 0 %
EOS PCT: 0 %
Eosinophils Absolute: 0 10*3/uL (ref 0.0–0.7)
HCT: 33.5 % — ABNORMAL LOW (ref 36.0–46.0)
Hemoglobin: 11.2 g/dL — ABNORMAL LOW (ref 12.0–15.0)
LYMPHS PCT: 15 %
Lymphs Abs: 1 10*3/uL (ref 0.7–4.0)
MCH: 31.6 pg (ref 26.0–34.0)
MCHC: 33.4 g/dL (ref 30.0–36.0)
MCV: 94.6 fL (ref 78.0–100.0)
MONO ABS: 0.4 10*3/uL (ref 0.1–1.0)
Monocytes Relative: 6 %
Neutro Abs: 5.2 10*3/uL (ref 1.7–7.7)
Neutrophils Relative %: 79 %
PLATELETS: 158 10*3/uL (ref 150–400)
RBC: 3.54 MIL/uL — AB (ref 3.87–5.11)
RDW: 12 % (ref 11.5–15.5)
WBC: 6.6 10*3/uL (ref 4.0–10.5)

## 2017-03-10 LAB — GLUCOSE, CAPILLARY
GLUCOSE-CAPILLARY: 83 mg/dL (ref 65–99)
Glucose-Capillary: 105 mg/dL — ABNORMAL HIGH (ref 65–99)

## 2017-03-10 LAB — PTH, INTACT AND CALCIUM
Calcium, Total (PTH): 6.8 mg/dL — CL (ref 8.7–10.3)
PTH: 325 pg/mL — ABNORMAL HIGH (ref 15–65)

## 2017-03-10 LAB — PROTIME-INR
INR: 2.11
Prothrombin Time: 24 seconds — ABNORMAL HIGH (ref 11.4–15.2)

## 2017-03-10 LAB — VITAMIN D 25 HYDROXY (VIT D DEFICIENCY, FRACTURES): Vit D, 25-Hydroxy: 8.7 ng/mL — ABNORMAL LOW (ref 30.0–100.0)

## 2017-03-10 MED ORDER — VITAMIN B-12 1000 MCG PO TABS: 1000.0000 ug | ORAL_TABLET | Freq: Every day | ORAL | Status: DC

## 2017-03-10 MED ORDER — CHOLECALCIFEROL 25 MCG (1000 UT) PO TABS: 1000.0000 [IU] | ORAL_TABLET | Freq: Every day | ORAL | Status: DC

## 2017-03-10 MED ORDER — RANOLAZINE ER 500 MG PO TB12: 500.0000 mg | ORAL_TABLET | Freq: Two times a day (BID) | ORAL | 1 refills | Status: DC

## 2017-03-10 MED ORDER — ISOSORBIDE MONONITRATE ER 60 MG PO TB24: 60.0000 mg | ORAL_TABLET | Freq: Two times a day (BID) | ORAL | 0 refills | Status: DC

## 2017-03-10 MED ORDER — CALCIUM CARBONATE 1250 (500 CA) MG PO TABS: 1.0000 | ORAL_TABLET | Freq: Four times a day (QID) | ORAL | Status: DC

## 2017-03-10 MED ORDER — POLYETHYLENE GLYCOL 3350 17 G PO PACK: 17.0000 g | PACK | Freq: Every day | ORAL | 0 refills | Status: DC

## 2017-03-10 MED ORDER — OXYCODONE HCL 5 MG PO TABS
5.0000 mg | ORAL_TABLET | Freq: Once | ORAL | Status: AC
  Administered 2017-03-10: 5 mg via ORAL
  Filled 2017-03-10: qty 1

## 2017-03-10 MED ORDER — WARFARIN SODIUM 5 MG PO TABS: 5.0000 mg | ORAL_TABLET | Freq: Once | ORAL | Status: DC

## 2017-03-10 MED ORDER — POLYETHYLENE GLYCOL 3350 17 G PO PACK
17.0000 g | PACK | Freq: Every day | ORAL | Status: DC
  Filled 2017-03-10: qty 1

## 2017-03-10 MED ORDER — POLYETHYLENE GLYCOL 3350 17 G PO PACK
17.0000 g | PACK | Freq: Every day | ORAL | Status: DC
  Administered 2017-03-10: 17 g via ORAL
  Filled 2017-03-10: qty 1

## 2017-03-10 NOTE — Progress Notes (Signed)
[place] for Warfarin Indication: atrial fibrillation  Allergies  Allergen Reactions  . Meperidine Hcl Other (See Comments)    Demerol - mental status changes    . Nitroglycerin Other (See Comments)    Patch and Cream only - blisters  . Morphine Palpitations    Patient Measurements: Height: 5' (152.4 cm) Weight: 174 lb 14.4 oz (79.3 kg) IBW/kg (Calculated) : 45.5  Vital Signs: Temp: 97.8 F (36.6 C) (06/14 0419) Temp Source: Oral (06/14 0419) BP: 138/62 (06/14 0419) Pulse Rate: 59 (06/14 0419)  Labs:  Recent Labs  03/08/17 0223 03/08/17 0525 03/08/17 1024 03/08/17 1613 03/09/17 0234 03/10/17 0254  HGB 12.0  --   --   --  10.6* 11.2*  HCT 35.9*  --   --   --  31.8* 33.5*  PLT 147*  --   --   --  157 158  LABPROT  --   --  24.3*  --  23.5* 24.0*  INR  --   --  2.14  --  2.06 2.11  CREATININE  --  0.90  --   --  0.88 0.86  TROPONINI  --  <0.03 <0.03 <0.03  --   --     Estimated Creatinine Clearance: 51.8 mL/min (by C-G formula based on SCr of 0.86 mg/dL).   Medical History: Past Medical History:  Diagnosis Date  . Anemia    a. Noted on labs 04/2013.  . Anginal pain ([place])   . Arthritis    "legs" (06/10/2014)  . CAD (coronary artery disease)    a. s/p CABG 1992, b. s/p PCI several years ago; c. LHC 11/08: L-LAD ok, S-OM1 ok, S-OM2 CTO, prox to mid CFX 70% (unchanged), RCA non-dominant, occluded;  d.  Echo 2/11: EF 55%, mod LVH, Ao sclerosis;  e. Echo 6/14: mild LVH, mild focal basal sept hypertrophy, EF 55-65%, inf HK, Mild AI, mild MR, mild BAE;  f. Myoview 7/14: int risk-> cath 04/2013 s/p PTCA/DES to prox Cx 05/04/13.  . Chronic diastolic CHF (congestive heart failure) ([place])   . GERD (gastroesophageal reflux disease)   . Glaucoma   . High cholesterol   . Hypertension   . Hypokalemia 02/2017  . Hypothyroidism   . LBBB (left bundle branch block)    chronic  . Myocardial infarction ([place]) 03/1991 X 2; 07/1991  . Obesity    . Seizures ([place])    "I'm on Dilantin" (06/10/2014)  . Sickle cell trait ([place])   . Type II diabetes mellitus ([facility])     Medications:  Prescriptions Prior to Admission  Medication Sig Dispense Refill Last Dose  . acetaminophen (TYLENOL) 325 MG tablet Take 1-2 tablets (325-650 mg total) by mouth every 4 (four) hours as needed for mild pain.   unk  . carvedilol (COREG) 6.25 MG tablet Take 0.5 tablets (3.125 mg total) by mouth 2 (two) times daily.   03/07/2017 at 1700  . cycloSPORINE (RESTASIS) 0.05 % ophthalmic emulsion Place 1 drop into both eyes 2 (two) times daily.   03/07/2017 at Unknown time  . ezetimibe (ZETIA) 10 MG tablet TAKE 1 TABLET BY MOUTH DAILY. 90 tablet 3 03/07/2017 at Unknown time  . ferrous sulfate 325 (65 FE) MG tablet Take 1 tablet (325 mg total) by mouth 2 (two) times daily with a meal. 90 tablet 0 03/07/2017 at Unknown time  . furosemide (LASIX) 40 MG tablet Take 1.5 tablets (60 mg total) by mouth 2 (two) times daily. 90 tablet 4  03/07/2017 at Unknown time  . ibandronate (BONIVA) 150 MG tablet Take 150 mg by mouth every 30 (thirty) days. Take in the morning with a full glass of water, on an empty stomach, and do not take anything else by mouth or lie down for the next 30 min.   03/03/2017  . isosorbide mononitrate (IMDUR) 60 MG 24 hr tablet TAKE 2 TABLETS BY MOUTH 2 TIMES A DAY. 966 tablet 3 03/07/2017 at Unknown time  . levothyroxine (SYNTHROID, LEVOTHROID) 100 MCG tablet Take 100 mcg by mouth daily before breakfast.   03/07/2017 at Unknown time  . metFORMIN (GLUCOPHAGE-XR) 500 MG 24 hr tablet Take 500 mg by mouth 2 (two) times daily.    03/07/2017 at Unknown time  . NITROSTAT 0.4 MG SL tablet PLACE 1 TABLET (0.4 MG TOTAL) UNDER THE TONGUE EVERY 5 (FIVE) MINUTES AS NEEDED FOR CHEST PAIN. 25 tablet 11 unk  . pantoprazole (PROTONIX) 40 MG tablet TAKE 1 TABLET BY MOUTH DAILY. 90 tablet 2 03/07/2017 at Unknown time  . phenytoin (DILANTIN) 100 MG ER capsule Take 100-200 mg by mouth 2 (two)  times daily. 200 mg in the morning and 100 mg in the evening   03/07/2017 at Unknown time  . potassium chloride SA (K-DUR,KLOR-CON) 20 MEQ tablet Take 1 tablet (20 mEq total) by mouth 2 (two) times daily. 60 tablet 3 03/07/2017 at Unknown time  . rosuvastatin (CRESTOR) 40 MG tablet TAKE 1 TABLET BY MOUTH DAILY. 90 tablet 2 03/07/2017 at Unknown time  . timolol (TIMOPTIC) 0.5 % ophthalmic solution Place 1 drop into both eyes daily.   6 03/07/2017 at Unknown time  . warfarin (COUMADIN) 5 MG tablet Take 1 to 1.5 tablets daily as directed by the [facility]. (Patient taking differently: Take 5-7.5 mg by mouth daily. Take 1 daily and 1.5 on Friday) 40 tablet 1 03/07/2017 at 1700    Assessment: 76 y.o. female admitted with chest pain, h/o AFib, to continue Coumadin   INR Is therapeutic at 2.11, Hgb low stable, Plt ok.  PTA Warfarin Dose: 7.5mg  Fri and 5mg  AODs  Goal of Therapy:  INR 2-3 Monitor platelets by anticoagulation protocol: Yes   Plan:  Warfarin 5mg  tonight x1 Daily INR Monitor s/sx of bleeding  Andrey Cota. Diona Foley, PharmD, BCPS Clinical Pharmacist [phone] 03/10/2017,9:53 AM

## 2017-03-10 NOTE — Discharge Summary (Signed)
Physician Discharge Summary  Christine Gay RXV:[id number] DOB: [birth year] DOA: 03/08/2017  PCP: Christine Carol, MD  Admit date: 03/08/2017 Discharge date: 03/10/2017  Time spent: 65 minutes  Recommendations for Outpatient Follow-up:  1. Follow-up with Christine Gay in 2 weeks. 2. Follow-up with orthopedics in 1-2 weeks. 3. Follow-up with Christine Carol, MD in 2 weeks. On follow-up patient will need a basic metabolic profile done to follow-up on electrolytes and renal function. [name] blood pressure need to be assessed and further adjustments made at that time. [name] vitamin D levels also need to be reassessed as patient was noted to have a vitamin D deficiency as well as low vitamin B-12 levels.   Discharge Diagnoses:  Principal Problem:   Chest pain Active Problems:   Shoulder pain, left   Hypothyroidism   HYPERTENSION, BENIGN   CAD, ARTERY BYPASS GRAFT   Hypotension   Chronic diastolic CHF (congestive heart failure) (HCC)   Microcytic hypochromic anemia   Complete heart block (HCC)   Atrial fibrillation (HCC)   Muscle spasm   Hypocalcemia   Seizure ([place])   Discharge Condition: stable and improved.  Diet recommendation: heart healthy  Filed Weights   03/08/17 0507  Weight: 79.3 kg (174 lb 14.4 oz)    History of present illness:  Per Christine Gay is a 76 y.o. female with history of CAD status post CABG and stenting with unremarkable cardiac cath in January 2018, complete heart block status post pacemaker in January 2018, paroxysmal atrial fibrillation, hypertension, hypothyroidism, diabetes mellitus presented to the ER with complaints of persistent neck pain radiating to the chest and shoulders. [name] symptoms started 1 day prior to admission. Has no relation to exertion denied any shortness of breath diaphoresis palpitations nausea or vomiting. Since the pain persisted patient came to the ER. Patient also complained of spasms of the muscles of the  hand and lower extremity.  ED Course: In the ER chest x-ray EKG troponins were unremarkable. X-ray of the left shoulder was unremarkable for CT of the neck was done to rule out any radiculopathy. CT of the neck was unremarkable. Patient has persistent pain. In addition patient is found to have hypocalcemia and patient also was complaining of spasms of the muscles. Particularly of the lower extremity. Patient was given 1 g IV calcium gluconate and Os-Cal orally.   Hospital Course:  #1 chest pain Patient with significant history of coronary artery disease status post CABG, history of complete heart block status post permanent pacemaker placement 09/2016 presented with complaints of chest pain. Patient stated improvement in substernal chest pain however when trying to comb her hair the morning of 03/09/2017, had some left shoulder and left upper extremity pain that was severe and worsened with movement. No shortness of breath. Cardiac enzymes negative 3. EKG with no ischemic changes noted. Patient started on Ranexa 500 mg twice a day per cardiology recommending up titration quickly if needed as patient's Dilantin will increase metabolic is above Ranexa. Per cardiology no further workup needed at this time and patient will need to follow-up with her primary cardiologist on discharge.  #2 left shoulder pain  Likely secondary to left shoulder osteoarthritis as noted on plain films of the left shoulder. CT of the C-spine with no acute abnormalities accounting for patient's left shoulder pain. Patient stated recently had some cortisone injections in her knees by her orthopedist, Christine Gay with symptomatic improvement. Orthopedics was consulted and patient seen in consultation by Christine Czar, PA orthopedics, who  assessed the patient and patient underwent left shoulder aspiration/injection with clinical improvement. Outpatient follow-up with orthopedics.  #3 hypotension Patient noted to be hypotensive  the morning of 03/09/2017, with repeat manual blood pressure with systolic blood pressures in the 80s. Patient on multiple cardiac medications and antihypertensive regimen that could lead to her hypotension. Patient given a bolus of normal saline 500 mL 1. [name] Imdur was decreased to 60 mg twice daily. [name] blood pressure improved and hyotension had resolved by day of discharge..  #4 atrial fibrillation Rate controlled on Coreg. INR at 2.06. Coumadin for anticoagulation.  #5 hypocalcemia/vitamin D deficiency Likely secondary to vitamin D deficiency. Vitamin D 25-hydroxy level of 5.9. Patient received a dose of IV calcium gluconate. Magnesium level was checked which came back at 2.2. Patient was placed on oral calcium carbonate tablets as well as vitamin D 1000 units daily. Outpatient follow-up.   #6 hypothyroidism Patient was maintained on home regimen of Synthroid.   #7 coronary artery disease status post CABG/complete heart block status post PPM/chronic diastolic heart failure Stable. Patient was continued on home regimen of Coreg, Zetia, Crestor, Coumadin. Decreased Imdur dose to 60 mg twice daily secondary to bouts of hypotension. Ranexa started per cardiology. Outpatient follow-up.  #8 hyperlipidemia Placed on home regimen of Crestor and Zetia.  #9 diabetes mellitus 2 Patient maintained on sliding scale insulin. Outpatient follow-up.  #10 history of seizures Stable. No seizure activity noted. Maintained on home regimen of Dilantin.  #11 low vitamin B-12 levels Patient noted to have low vitamin B-12 levels. Patient was started on IM vitamin B-12 during the hospitalization be discharged home on oral vitamin B-12 1000 MCG's daily. Outpatient follow-up.   Procedures:  Chest x-ray 03/07/2017  Plain films of the left shoulder 03/07/2017  CT C-spine 03/08/2017  Consultations: Orthopedics : Christine Gay 03/09/2017 Cardiology: Christine Gay 03/08/2017  Discharge  Exam: Vitals:   03/10/17 0419 03/10/17 1226  BP: 138/62 136/60  Pulse: (!) 59 60  Resp: 18 18  Temp: 97.8 F (36.6 C) 97.8 F (36.6 C)    General: NAD Cardiovascular: RRR Respiratory: CTAB  Discharge Instructions   Discharge Instructions    Diet - low sodium heart healthy    Complete by:  As directed    Increase activity slowly    Complete by:  As directed      Current Discharge Medication List    START taking these medications   Details  calcium carbonate (OS-CAL - DOSED IN MG OF ELEMENTAL CALCIUM) 1250 (500 Ca) MG tablet Take 1 tablet (500 mg of elemental calcium total) by mouth 4 (four) times daily.    cholecalciferol 1000 units tablet Take 1 tablet (1,000 Units total) by mouth daily.    polyethylene glycol (MIRALAX / GLYCOLAX) packet Take 17 g by mouth daily. Qty: 14 each, Refills: 0    ranolazine (RANEXA) 500 MG 12 hr tablet Take 1 tablet (500 mg total) by mouth 2 (two) times daily. Qty: 60 tablet, Refills: 1    vitamin B-12 (CYANOCOBALAMIN) 1000 MCG tablet Take 1 tablet (1,000 mcg total) by mouth daily.      CONTINUE these medications which have CHANGED   Details  isosorbide mononitrate (IMDUR) 60 MG 24 hr tablet Take 1 tablet (60 mg total) by mouth 2 (two) times daily. Qty: 1 tablet, Refills: 0      CONTINUE these medications which have NOT CHANGED   Details  acetaminophen (TYLENOL) 325 MG tablet Take 1-2 tablets (325-650 mg total) by mouth every  4 (four) hours as needed for mild pain.    carvedilol (COREG) 6.25 MG tablet Take 0.5 tablets (3.125 mg total) by mouth 2 (two) times daily.    cycloSPORINE (RESTASIS) 0.05 % ophthalmic emulsion Place 1 drop into both eyes 2 (two) times daily.    ezetimibe (ZETIA) 10 MG tablet TAKE 1 TABLET BY MOUTH DAILY. Qty: 90 tablet, Refills: 3    ferrous sulfate 325 (65 FE) MG tablet Take 1 tablet (325 mg total) by mouth 2 (two) times daily with a meal. Qty: 90 tablet, Refills: 0    furosemide (LASIX) 40 MG tablet  Take 1.5 tablets (60 mg total) by mouth 2 (two) times daily. Qty: 90 tablet, Refills: 4    ibandronate (BONIVA) 150 MG tablet Take 150 mg by mouth every 30 (thirty) days. Take in the morning with a full glass of water, on an empty stomach, and do not take anything else by mouth or lie down for the next 30 min.    levothyroxine (SYNTHROID, LEVOTHROID) 100 MCG tablet Take 100 mcg by mouth daily before breakfast.    metFORMIN (GLUCOPHAGE-XR) 500 MG 24 hr tablet Take 500 mg by mouth 2 (two) times daily.     NITROSTAT 0.4 MG SL tablet PLACE 1 TABLET (0.4 MG TOTAL) UNDER THE TONGUE EVERY 5 (FIVE) MINUTES AS NEEDED FOR CHEST PAIN. Qty: 25 tablet, Refills: 11    pantoprazole (PROTONIX) 40 MG tablet TAKE 1 TABLET BY MOUTH DAILY. Qty: 90 tablet, Refills: 2    phenytoin (DILANTIN) 100 MG ER capsule Take 100-200 mg by mouth 2 (two) times daily. 200 mg in the morning and 100 mg in the evening    potassium chloride SA (K-DUR,KLOR-CON) 20 MEQ tablet Take 1 tablet (20 mEq total) by mouth 2 (two) times daily. Qty: 60 tablet, Refills: 3    rosuvastatin (CRESTOR) 40 MG tablet TAKE 1 TABLET BY MOUTH DAILY. Qty: 90 tablet, Refills: 2    timolol (TIMOPTIC) 0.5 % ophthalmic solution Place 1 drop into both eyes daily.  Refills: 6    warfarin (COUMADIN) 5 MG tablet Take 1 to 1.5 tablets daily as directed by the [facility]. Qty: 40 tablet, Refills: 1       Allergies  Allergen Reactions  . Meperidine Hcl Other (See Comments)    Demerol - mental status changes    . Nitroglycerin Other (See Comments)    Patch and Cream only - blisters  . Morphine Palpitations   Follow-up Information    Earlie Server, MD. Schedule an appointment as soon as possible for a visit.   Specialty:  Orthopedic Surgery Why:  1-2 weeks if symptoms persist in the shoulder or neck.  next week appt would be with Baldo Ash, PA-C Contact information: [address] [place]  [address] [phone]        Larey Dresser, MD. Schedule an appointment as soon as possible for a visit in 2 week(s).   Specialty:  Cardiology Why:  f/u in 2-3 weeks. Contact information: [address] [place] [address] [phone]        Christine Carol, MD. Schedule an appointment as soon as possible for a visit in 2 week(s).   Specialty:  Internal Medicine Contact information: [address] [place] [address] [phone]            The results of significant diagnostics from this hospitalization (including imaging, microbiology, ancillary and laboratory) are listed below for reference.  Significant Diagnostic Studies: Dg Chest 2 View  Result Date: 03/07/2017 CLINICAL DATA:  Acute onset of left shoulder pain, radiating to the left arm and neck. Initial encounter. EXAM: CHEST  2 VIEW COMPARISON:  Chest radiograph performed 09/30/2016 FINDINGS: The lungs are well-aerated and clear. There is no evidence of focal opacification, pleural effusion or pneumothorax. The heart is mildly enlarged. The patient is status post median sternotomy. A pacemaker is noted at the left chest wall, with leads ending at the right atrium and right ventricle. No acute osseous abnormalities are seen. Scattered calcification is seen along the proximal abdominal aorta. IMPRESSION: 1. No acute cardiopulmonary process seen. 2. Mild cardiomegaly. 3. Scattered aortic atherosclerosis. Electronically Signed   By: Garald Balding M.D.   On: 03/07/2017 21:51   Ct Cervical Spine Wo Contrast  Result Date: 03/08/2017 CLINICAL DATA:  Acute onset of neck pain, radiating to the left shoulder and left chest. Initial encounter. EXAM: CT CERVICAL SPINE WITHOUT CONTRAST TECHNIQUE: Multidetector CT imaging of the cervical spine was performed without intravenous contrast. Multiplanar CT image reconstructions were also generated. COMPARISON:  None. FINDINGS: Alignment: Normal. Skull  base and vertebrae: No acute fracture. No primary bone lesion or focal pathologic process. Soft tissues and spinal canal: No prevertebral fluid or swelling. No visible canal hematoma. Disc levels: Intervertebral disc spaces are preserved. The bony foramina are grossly unremarkable. Upper chest: Calcification is noted at the carotid bifurcations bilaterally. The visualized lung bases are clear. The thyroid gland is not well characterized. Other: No additional soft tissue abnormalities are seen. Cerebellar atrophy is noted. IMPRESSION: 1. No evidence of fracture or subluxation along the cervical spine. 2. Calcification at the carotid bifurcations bilaterally. Carotid ultrasound could be considered for further evaluation, when and as deemed clinically appropriate. 3. Cerebellar atrophy noted Electronically Signed   By: Garald Balding M.D.   On: 03/08/2017 04:55   Dg Shoulder Left  Result Date: 03/07/2017 CLINICAL DATA:  Shoulder pain radiating to left arm and neck since yesterday. EXAM: LEFT SHOULDER - 2+ VIEW COMPARISON:  09/30/2016 CXR which includes the left shoulder FINDINGS: There is no evidence of acute fracture or dislocation. There is slight joint space narrowing of the glenohumeral articulation with minimal degenerate change noted. AC joint is maintained. The patient is status post CABG with left-sided pacemaker apparatus projecting the axilla. The included ribs and lung are unremarkable. IMPRESSION: No acute fracture nor dislocation of the left shoulder. Osteoarthritis of the left glenohumeral joint. Electronically Signed   By: Ashley Royalty M.D.   On: 03/07/2017 21:51    Microbiology: No results found for this or any previous visit (from the past 240 hour(s)).   Labs: Basic Metabolic Panel:  Recent Labs Lab 03/07/17 2103 03/08/17 0525 03/09/17 0234 03/09/17 0825 03/10/17 0254  NA 138 137 138  --  138  K 3.9 3.4* 3.7  --  3.9  CL 105 104 107  --  108  CO2 23 24 23   --  24  GLUCOSE 92 96  97  --  146*  BUN 14 13 11   --  9  CREATININE 0.99 0.90 0.88  --  0.86  CALCIUM 6.3* 6.7*  6.8* 6.6*  --  7.1*  MG 2.0  --   --  2.2  --   PHOS  --  2.6  --   --   --    Liver Function Tests:  Recent Labs Lab 03/08/17 0223 03/10/17 0254  AST 23 19  ALT 16 13*  ALKPHOS 116 102  BILITOT 0.6 0.5  PROT 7.3 6.5  ALBUMIN 3.7 3.1*   No results for input(s): LIPASE, AMYLASE in the last 168 hours. No results for input(s): AMMONIA in the last 168 hours. CBC:  Recent Labs Lab 03/07/17 2103 03/08/17 0223 03/09/17 0234 03/10/17 0254  WBC 7.5 9.2 6.4 6.6  NEUTROABS  --  6.1  --  5.2  HGB 11.9* 12.0 10.6* 11.2*  HCT 35.4* 35.9* 31.8* 33.5*  MCV 95.7 95.7 95.5 94.6  PLT 163 147* 157 158   Cardiac Enzymes:  Recent Labs Lab 03/08/17 0525 03/08/17 1024 03/08/17 1613  TROPONINI <0.03 <0.03 <0.03   BNP: BNP (last 3 results)  Recent Labs  05/18/16 1147 09/28/16 1256 11/19/16 1154  BNP 207.9* 380.3* 163.4*    ProBNP (last 3 results)  Recent Labs  11/29/16 0936  PROBNP 789*    CBG:  Recent Labs Lab 03/09/17 1113 03/09/17 1628 03/09/17 2029 03/10/17 0654 03/10/17 1122  GLUCAP 172* 71 243* 83 105*       Signed:  Murtaza Shell MD.  [facility] 03/10/2017, 1:32 PM

## 2017-03-11 ENCOUNTER — Ambulatory Visit (INDEPENDENT_AMBULATORY_CARE_PROVIDER_SITE_OTHER): Payer: Medicare Other | Admitting: Pharmacist

## 2017-03-11 DIAGNOSIS — I4891 Unspecified atrial fibrillation: Secondary | ICD-10-CM

## 2017-03-11 DIAGNOSIS — Z5181 Encounter for therapeutic drug level monitoring: Secondary | ICD-10-CM

## 2017-03-11 LAB — POCT INR: INR: 2.2

## 2017-03-11 LAB — HEMOGLOBIN A1C
HEMOGLOBIN A1C: 5.3 % (ref 4.8–5.6)
Mean Plasma Glucose: 105 mg/dL

## 2017-03-14 ENCOUNTER — Telehealth: Payer: Self-pay | Admitting: *Deleted

## 2017-03-14 NOTE — Telephone Encounter (Signed)
03/14/17 Left message for patient that her diabetic shoes and inserts are in please call and schedule an appointment  auth expires 05/28/17

## 2017-03-21 ENCOUNTER — Other Ambulatory Visit: Payer: Self-pay | Admitting: Orthopedic Surgery

## 2017-03-21 DIAGNOSIS — M25512 Pain in left shoulder: Secondary | ICD-10-CM

## 2017-03-31 ENCOUNTER — Telehealth: Payer: Self-pay | Admitting: Pharmacist

## 2017-03-31 NOTE — Telephone Encounter (Signed)
Received fax from [facility] to hold Coumadin for 4 days for CT arthrogram shoulder. Ok to hold 4 day prior to procedure (unscheduled). Faxed back to number provided [phone].

## 2017-03-31 NOTE — Telephone Encounter (Signed)
-----   Message from Larey Dresser, MD sent at 03/31/2017 10:08 AM EDT ----- Regarding: RE: Anticoag clearance I do no think she would have to be bridged.  ----- Message ----- From: Erskine Emery, [facility] Sent: 03/31/2017   9:52 AM To: Larey Dresser, MD Subject: Anticoag clearance                             Dr. Aundra Dubin,  We received a request to hold warfarin for Ms. Wissner for an arthrogram of shoulder. She is relatively new to coumadin. Based on her chart her CHADS2 score is 4 (CHF, HTN, Age, DM) and CHADSVASC is 7. Based on her CHADS2 score she would not necessarily meet criteria for bridging with lovenox, but since her CHADSVASC is so high I wanted to get your thoughts on her need for a lovenox bridge. Please advise if you would like her to be bridged for procedures. Thank you!  Thanks, Georgina Peer

## 2017-04-04 ENCOUNTER — Ambulatory Visit (INDEPENDENT_AMBULATORY_CARE_PROVIDER_SITE_OTHER): Payer: Medicare Other | Admitting: *Deleted

## 2017-04-04 DIAGNOSIS — Z5181 Encounter for therapeutic drug level monitoring: Secondary | ICD-10-CM

## 2017-04-04 DIAGNOSIS — I4891 Unspecified atrial fibrillation: Secondary | ICD-10-CM

## 2017-04-04 LAB — POCT INR: INR: 2.7

## 2017-04-06 ENCOUNTER — Other Ambulatory Visit: Payer: Self-pay | Admitting: Cardiology

## 2017-04-08 ENCOUNTER — Other Ambulatory Visit: Payer: Self-pay | Admitting: Cardiology

## 2017-04-13 ENCOUNTER — Other Ambulatory Visit: Payer: Medicare Other

## 2017-04-14 ENCOUNTER — Ambulatory Visit
Admission: RE | Admit: 2017-04-14 | Discharge: 2017-04-14 | Disposition: A | Discharge status: Home / Self Care | Payer: Medicare Other | Source: Ambulatory Visit | Attending: Orthopedic Surgery | Admitting: Orthopedic Surgery

## 2017-04-14 DIAGNOSIS — M25512 Pain in left shoulder: Secondary | ICD-10-CM

## 2017-04-14 MED ORDER — IOPAMIDOL (ISOVUE-M 200) INJECTION 41%
15.0000 mL | Freq: Once | INTRAMUSCULAR | Status: AC
  Administered 2017-04-14: 15 mL via INTRA_ARTICULAR

## 2017-04-18 ENCOUNTER — Ambulatory Visit (INDEPENDENT_AMBULATORY_CARE_PROVIDER_SITE_OTHER): Payer: Medicare Other | Admitting: *Deleted

## 2017-04-18 ENCOUNTER — Telehealth: Payer: Self-pay | Admitting: Podiatry

## 2017-04-18 DIAGNOSIS — I442 Atrioventricular block, complete: Secondary | ICD-10-CM

## 2017-04-18 NOTE — Telephone Encounter (Signed)
Left message for pt to call to reschedule her appt for 8.17 and if she wanted to be seen to pick up diabetic shoes before this.

## 2017-04-20 NOTE — Progress Notes (Signed)
Remote pacemaker transmission.   

## 2017-04-21 ENCOUNTER — Ambulatory Visit (INDEPENDENT_AMBULATORY_CARE_PROVIDER_SITE_OTHER): Payer: Medicare Other | Admitting: *Deleted

## 2017-04-21 ENCOUNTER — Encounter: Payer: Self-pay | Admitting: Cardiology

## 2017-04-21 DIAGNOSIS — Z5181 Encounter for therapeutic drug level monitoring: Secondary | ICD-10-CM

## 2017-04-21 DIAGNOSIS — I4891 Unspecified atrial fibrillation: Secondary | ICD-10-CM

## 2017-04-21 LAB — POCT INR: INR: 2.6

## 2017-04-25 LAB — CUP PACEART REMOTE DEVICE CHECK
Battery Remaining Longevity: 102 mo
Battery Voltage: 2.99 V
Brady Statistic AP VP Percent: 34 %
Brady Statistic AS VP Percent: 63 %
Brady Statistic AS VS Percent: 1 %
Brady Statistic RA Percent Paced: 32 %
Implantable Lead Implant Date: 20180103
Implantable Lead Location: 753860
Implantable Pulse Generator Implant Date: 20180103
Lead Channel Impedance Value: 450 Ohm
Lead Channel Impedance Value: 490 Ohm
Lead Channel Pacing Threshold Amplitude: 0.5 V
Lead Channel Pacing Threshold Pulse Width: 0.5 ms
Lead Channel Pacing Threshold Pulse Width: 0.5 ms
Lead Channel Sensing Intrinsic Amplitude: 5 mV
Lead Channel Setting Pacing Amplitude: 2.5 V
MDC IDC LEAD IMPLANT DT: 20180103
MDC IDC LEAD LOCATION: 753859
MDC IDC MSMT BATTERY REMAINING PERCENTAGE: 95.5 %
MDC IDC MSMT LEADCHNL RV PACING THRESHOLD AMPLITUDE: 0.5 V
MDC IDC MSMT LEADCHNL RV SENSING INTR AMPL: 12 mV
MDC IDC PG SERIAL: 7988263
MDC IDC SESS DTM: 20180724035802
MDC IDC SET LEADCHNL RA PACING AMPLITUDE: 2 V
MDC IDC SET LEADCHNL RV PACING PULSEWIDTH: 0.5 ms
MDC IDC SET LEADCHNL RV SENSING SENSITIVITY: 2 mV
MDC IDC STAT BRADY AP VS PERCENT: 1 %
MDC IDC STAT BRADY RV PERCENT PACED: 97 %

## 2017-04-29 ENCOUNTER — Other Ambulatory Visit: Payer: Self-pay | Admitting: Internal Medicine

## 2017-04-29 ENCOUNTER — Ambulatory Visit
Admission: RE | Admit: 2017-04-29 | Discharge: 2017-04-29 | Disposition: A | Discharge status: Home / Self Care | Payer: Medicare Other | Source: Ambulatory Visit | Attending: Internal Medicine | Admitting: Internal Medicine

## 2017-04-29 DIAGNOSIS — M5489 Other dorsalgia: Secondary | ICD-10-CM

## 2017-05-12 ENCOUNTER — Encounter: Payer: Self-pay | Admitting: Podiatry

## 2017-05-12 ENCOUNTER — Ambulatory Visit (INDEPENDENT_AMBULATORY_CARE_PROVIDER_SITE_OTHER): Payer: Medicare Other | Admitting: Podiatry

## 2017-05-12 ENCOUNTER — Ambulatory Visit (INDEPENDENT_AMBULATORY_CARE_PROVIDER_SITE_OTHER): Payer: Medicare Other | Admitting: *Deleted

## 2017-05-12 DIAGNOSIS — M79676 Pain in unspecified toe(s): Secondary | ICD-10-CM | POA: Diagnosis not present

## 2017-05-12 DIAGNOSIS — M2041 Other hammer toe(s) (acquired), right foot: Secondary | ICD-10-CM

## 2017-05-12 DIAGNOSIS — M2042 Other hammer toe(s) (acquired), left foot: Secondary | ICD-10-CM | POA: Diagnosis not present

## 2017-05-12 DIAGNOSIS — B351 Tinea unguium: Secondary | ICD-10-CM

## 2017-05-12 DIAGNOSIS — Z5181 Encounter for therapeutic drug level monitoring: Secondary | ICD-10-CM | POA: Diagnosis not present

## 2017-05-12 DIAGNOSIS — E1140 Type 2 diabetes mellitus with diabetic neuropathy, unspecified: Secondary | ICD-10-CM | POA: Diagnosis not present

## 2017-05-12 DIAGNOSIS — I4891 Unspecified atrial fibrillation: Secondary | ICD-10-CM | POA: Diagnosis not present

## 2017-05-12 LAB — POCT INR: INR: 2.7

## 2017-05-12 NOTE — Progress Notes (Signed)
Patient ID: Christine Gay, female   DOB: 1940/12/23, 76 y.o.   MRN: [id number] Complaint:  Visit Type: Patient returns to my office for continued preventative foot care services. Complaint: Patient states" my nails have grown long and thick and become painful to walk and wear shoes" Patient has been diagnosed with DM with neuropathy. The patient presents for preventative foot care services. No changes to ROS  Podiatric Exam: Vascular: dorsalis pedis and posterior tibial pulses are palpable bilateral. Capillary return is immediate. Temperature gradient is WNL. Skin turgor WNL  Sensorium: Diminished  Semmes Weinstein monofilament test. Normal tactile sensation bilaterally. Nail Exam: Pt has thick disfigured discolored nails with subungual debris noted bilateral entire nail hallux through fifth toenails Ulcer Exam: There is no evidence of ulcer or pre-ulcerative changes or infection. Orthopedic Exam: Muscle tone and strength are WNL. No limitations in general ROM. No crepitus or effusions noted. Foot type and digits show no abnormalities. Bony prominences are unremarkable. HAV 1st  MPJ B/L.  Hammer toes 2 B/l Skin: No Porokeratosis. No infection or ulcers.  Asymptomatic  Callus sub 5th met left.  Diagnosis:  Onychomycosis, , Pain in right toe, pain in left toes, DPN  Treatment & Plan Procedures and Treatment: Consent by patient was obtained for treatment procedures. The patient understood the discussion of treatment and procedures well. All questions were answered thoroughly reviewed. Debridement of mycotic and hypertrophic toenails, 1 through 5 bilateral and clearing of subungual debris. No ulceration, no infection noted. .Initiate diabetic shoes for DPN, HAV and hammer toes. Diabetic shoes to be dispensed. Patient presents today and was dispensed 0ne pair ( two units) of medically necessary extra depth shoes with three pair( six units) of custom molded multiple density inserts. The shoes and the  inserts are fitted to the patients ' feet and are noted to fit well and are free of defect.  Length and width of the shoes are also acceptable.  Patient was given written and verbal  instructions for wearing.  If any concerns arrive with the shoes or inserts, the patient is to call the office.Patient is to follow up with doctor in six weeks. Return Visit-Office Procedure: Patient instructed to return to the office for a follow up visit 3 months for continued evaluation and treatment.  Gardiner Barefoot DPM

## 2017-05-13 ENCOUNTER — Ambulatory Visit: Payer: Medicare Other | Admitting: Podiatry

## 2017-05-31 ENCOUNTER — Encounter (HOSPITAL_COMMUNITY): Payer: Medicare Other | Admitting: Cardiology

## 2017-06-04 ENCOUNTER — Other Ambulatory Visit: Payer: Self-pay | Admitting: Cardiology

## 2017-06-24 ENCOUNTER — Ambulatory Visit (INDEPENDENT_AMBULATORY_CARE_PROVIDER_SITE_OTHER): Payer: Medicare Other | Admitting: *Deleted

## 2017-06-24 DIAGNOSIS — Z5181 Encounter for therapeutic drug level monitoring: Secondary | ICD-10-CM

## 2017-06-24 DIAGNOSIS — I4891 Unspecified atrial fibrillation: Secondary | ICD-10-CM | POA: Diagnosis not present

## 2017-06-24 LAB — POCT INR: INR: 2

## 2017-06-30 ENCOUNTER — Encounter (HOSPITAL_COMMUNITY): Payer: Self-pay | Admitting: *Deleted

## 2017-06-30 ENCOUNTER — Ambulatory Visit (HOSPITAL_COMMUNITY)
Admission: RE | Admit: 2017-06-30 | Discharge: 2017-06-30 | Disposition: A | Discharge status: Home / Self Care | Payer: Medicare Other | Source: Ambulatory Visit | Attending: Cardiology | Admitting: Cardiology

## 2017-06-30 ENCOUNTER — Encounter (HOSPITAL_COMMUNITY): Payer: Self-pay | Admitting: Cardiology

## 2017-06-30 VITALS — BP 116/52 | HR 69 | Wt 170.0 lb

## 2017-06-30 DIAGNOSIS — Z7984 Long term (current) use of oral hypoglycemic drugs: Secondary | ICD-10-CM | POA: Insufficient documentation

## 2017-06-30 DIAGNOSIS — Z9049 Acquired absence of other specified parts of digestive tract: Secondary | ICD-10-CM | POA: Diagnosis not present

## 2017-06-30 DIAGNOSIS — E785 Hyperlipidemia, unspecified: Secondary | ICD-10-CM | POA: Diagnosis not present

## 2017-06-30 DIAGNOSIS — Z79899 Other long term (current) drug therapy: Secondary | ICD-10-CM | POA: Diagnosis not present

## 2017-06-30 DIAGNOSIS — I2582 Chronic total occlusion of coronary artery: Secondary | ICD-10-CM | POA: Insufficient documentation

## 2017-06-30 DIAGNOSIS — I480 Paroxysmal atrial fibrillation: Secondary | ICD-10-CM | POA: Insufficient documentation

## 2017-06-30 DIAGNOSIS — I25810 Atherosclerosis of coronary artery bypass graft(s) without angina pectoris: Secondary | ICD-10-CM | POA: Diagnosis not present

## 2017-06-30 DIAGNOSIS — E119 Type 2 diabetes mellitus without complications: Secondary | ICD-10-CM | POA: Diagnosis not present

## 2017-06-30 DIAGNOSIS — H409 Unspecified glaucoma: Secondary | ICD-10-CM | POA: Insufficient documentation

## 2017-06-30 DIAGNOSIS — I442 Atrioventricular block, complete: Secondary | ICD-10-CM | POA: Diagnosis not present

## 2017-06-30 DIAGNOSIS — Z833 Family history of diabetes mellitus: Secondary | ICD-10-CM | POA: Diagnosis not present

## 2017-06-30 DIAGNOSIS — E039 Hypothyroidism, unspecified: Secondary | ICD-10-CM | POA: Insufficient documentation

## 2017-06-30 DIAGNOSIS — G40909 Epilepsy, unspecified, not intractable, without status epilepticus: Secondary | ICD-10-CM | POA: Diagnosis not present

## 2017-06-30 DIAGNOSIS — I110 Hypertensive heart disease with heart failure: Secondary | ICD-10-CM | POA: Insufficient documentation

## 2017-06-30 DIAGNOSIS — I4891 Unspecified atrial fibrillation: Secondary | ICD-10-CM | POA: Diagnosis not present

## 2017-06-30 DIAGNOSIS — K219 Gastro-esophageal reflux disease without esophagitis: Secondary | ICD-10-CM | POA: Insufficient documentation

## 2017-06-30 DIAGNOSIS — Z7901 Long term (current) use of anticoagulants: Secondary | ICD-10-CM | POA: Diagnosis not present

## 2017-06-30 DIAGNOSIS — I5032 Chronic diastolic (congestive) heart failure: Secondary | ICD-10-CM | POA: Diagnosis present

## 2017-06-30 LAB — BASIC METABOLIC PANEL
ANION GAP: 8 (ref 5–15)
BUN: 8 mg/dL (ref 6–20)
CALCIUM: 8.1 mg/dL — AB (ref 8.9–10.3)
CHLORIDE: 103 mmol/L (ref 101–111)
CO2: 26 mmol/L (ref 22–32)
CREATININE: 0.98 mg/dL (ref 0.44–1.00)
GFR calc non Af Amer: 55 mL/min — ABNORMAL LOW (ref >60)
Glucose, Bld: 118 mg/dL — ABNORMAL HIGH (ref 65–99)
Potassium: 3.6 mmol/L (ref 3.5–5.1)
SODIUM: 137 mmol/L (ref 135–145)

## 2017-06-30 LAB — LIPID PANEL
CHOLESTEROL: 137 mg/dL (ref 0–200)
HDL: 40 mg/dL — AB (ref >40)
LDL Cholesterol: 76 mg/dL (ref 0–99)
Total CHOL/HDL Ratio: 3.4 RATIO
Triglycerides: 104 mg/dL (ref <150)
VLDL: 21 mg/dL (ref 0–40)

## 2017-06-30 MED ORDER — GLUCOSE BLOOD VI STRP: ORAL_STRIP | 0 refills | Status: AC

## 2017-06-30 NOTE — Progress Notes (Signed)
Error

## 2017-06-30 NOTE — Progress Notes (Signed)
Patient ID: Christine Gay, female   DOB: July 16, 1941, 76 y.o.   MRN: [id number] PCP: Dr. Delfina Redwood Cardiology: Dr. Aundra Dubin  76 yo with history of CAD s/p CABG, DM, and HTN presents for followup of CHF and CAD.  She had a Lexiscan Cardiolite in 2014 that was an intermediate risk study and echo showed basal to mid inferior hypokinesis with EF 55-60%.  I did a cardaic catheterization in 8/14 that showed patent LIMA-LAD, occluded SVG-OM1, and 95% ostial LCx stenosis.  She had PCI to ostial LCx with Promus DES.  Cardiolite in 5/15 showed no ischemia or infarction.  She had an upper GI bleed in 9/15, she is now off Plavix.   In 1/18, she was admitted with severe fatigue and dyspnea and was found to be in complete heart block.  Troponin was up to 4.9.   She had LHC showing patent LIMA-LAD and SVG-OM1, patent LCx stent, and occluded SVG-OM2 (known from past), no evidence for plaque rupture/new disease. Echo showed EF stable at 50-55%.  She had St Jude PPM placed.   Since last appointment, she was started on warfarin due to paroxysmal atrial fibrillation noted by device interrogation.   She seems to be doing fairly well recently.  No falls.  Using walker.  Doing more walking than in the past.  No dyspnea walking to the mailbox and down to the [facility].  No orthopnea/PND.  Weight down 6 lbs.  No chest pain.   ECG (personally reviewed): A-V sequential pacing.   Labs (3/13): K 4.2, creatinine 0.77 Labs (6/14): LDL 80, HDL 69 Labs (8/14): BNP 971 => 420 => 138, creatinine 0.8, K 4.3 Labs (11/14): K 4.1, creatinine 0.8, BNP 88 Labs (3/15): K 3.4, creatinine 0.86, LDL 85 Labs (9/15): K 3.2, creatinine 0.63, hgb 8 Labs (1/16): LDL 96 Labs (2/16): K 4.2, creatinine 0.89 Labs (3/16): LDL 91, HDL 56 Labs (7/16): TSH normal, K 4.7, creatinine 0.9, hgb 11.4, LDL 93 Labs (8/17): LDL 83, HDL 62 Labs (1/18): K 4, creatinine 1.09, hgb 12.3 Labs (2/18): K 4.2, creatinine 0.81, BNP 163 Labs (3/18): K 3.9, creatinine  0.96 Labs (6/18): K 3.9, creatinine 0.86, hgb 11.2  PMH: 1. GERD 2. Seizure disorder 3. Type II diabetes 4. Chronic LBBB 5. CAD: s/p CABG in 1992, had PCI several years ago.  Echo (2/11) with EF 55%, moderate LVH, aortic sclerosis.  LHC (8/14) with patent LIMA-LAD, totally occluded SVG-OM1, 95% ostial LCx, totally occluded small nondominant RCA with EF 55%.  Patient had Promus DES to pLCx. Lexiscan Cardiolite (5/15) with EF 57%, mild breast attenuation, no ischemia or infarction.  Lexiscan Cardiolite (2/16) with EF 44%, small partially reversible mid anterior perfusion defect may be due to soft tissue attenuation.   - LHC (1/18): patent LIMA-LAD, totally occluded LAD, patent SVG-OM1, totally occluded OM2 and SVG-OM2, patent stent in LCx, totally occluded nondominant RCA with right->right collaterals.  6. Glaucoma 7. Hypothyroidism 8. HTN 9. Cholecystectomy 10. Diastolic CHF: Echo (5/95) with EF 55-60%, basal to mid inferior hypokinesis, mild MR and mild AI. Echo (2/16) with EF 55-60%, mild LAE. - Echo (1/18): EF 50-5%, mild LVH, mildly decreased RV systolic function, mild AI.   11. Carotid stenosis: Carotid dopplers (6/15) with 40-59% bilateral ICA stenosis.  - Carotid dopplers (6/38) with 75-64% LICA stenosis.  - Carotid dopplers (5/18): Mild disease bilaterally.  12. GI bleed 9/15: EGD showed a polypoid gastric lesion that was friable, benign pathology.  13. Complete heart block: St Jude PPM  in 1/18.  14. Atrial fibrillation: Paroxysmal.   SH: Widow, lives in [place], does not smoke.   FH: No premature CAD.  Diabetes.   ROS: All systems reviewed and negative except as per HPI.   Current Outpatient Prescriptions  Medication Sig Dispense Refill  . acetaminophen (TYLENOL) 325 MG tablet Take 1-2 tablets (325-650 mg total) by mouth every 4 (four) hours as needed for mild pain.    . calcium carbonate (OS-CAL - DOSED IN MG OF ELEMENTAL CALCIUM) 1250 (500 Ca) MG tablet Take 1 tablet (500  mg of elemental calcium total) by mouth 4 (four) times daily.    . carvedilol (COREG) 6.25 MG tablet Take 0.5 tablets (3.125 mg total) by mouth 2 (two) times daily.    . cholecalciferol 1000 units tablet Take 1 tablet (1,000 Units total) by mouth daily.    . cycloSPORINE (RESTASIS) 0.05 % ophthalmic emulsion Place 1 drop into both eyes 2 (two) times daily.    Marland Kitchen ezetimibe (ZETIA) 10 MG tablet TAKE 1 TABLET BY MOUTH DAILY. 90 tablet 3  . ferrous sulfate 325 (65 FE) MG tablet Take 1 tablet (325 mg total) by mouth 2 (two) times daily with a meal. 90 tablet 0  . furosemide (LASIX) 40 MG tablet Take 1.5 tablets (60 mg total) by mouth 2 (two) times daily. 90 tablet 4  . ibandronate (BONIVA) 150 MG tablet Take 150 mg by mouth every 30 (thirty) days. Take in the morning with a full glass of water, on an empty stomach, and do not take anything else by mouth or lie down for the next 30 min.    . isosorbide mononitrate (IMDUR) 60 MG 24 hr tablet Take 1 tablet (60 mg total) by mouth 2 (two) times daily. 1 tablet 0  . levothyroxine (SYNTHROID, LEVOTHROID) 100 MCG tablet Take 100 mcg by mouth daily before breakfast.    . metFORMIN (GLUCOPHAGE-XR) 500 MG 24 hr tablet Take 500 mg by mouth 2 (two) times daily.     Marland Kitchen NITROSTAT 0.4 MG SL tablet PLACE 1 TABLET (0.4 MG TOTAL) UNDER THE TONGUE EVERY 5 (FIVE) MINUTES AS NEEDED FOR CHEST PAIN. 25 tablet 11  . pantoprazole (PROTONIX) 40 MG tablet TAKE 1 TABLET BY MOUTH DAILY. 90 tablet 2  . phenytoin (DILANTIN) 100 MG ER capsule Take 100-200 mg by mouth 2 (two) times daily. 200 mg in the morning and 100 mg in the evening    . polyethylene glycol (MIRALAX / GLYCOLAX) packet Take 17 g by mouth daily. 14 each 0  . potassium chloride SA (K-DUR,KLOR-CON) 20 MEQ tablet Take 1 tablet (20 mEq total) by mouth 2 (two) times daily. 60 tablet 3  . rosuvastatin (CRESTOR) 40 MG tablet TAKE 1 TABLET BY MOUTH DAILY. 90 tablet 2  . timolol (TIMOPTIC) 0.5 % ophthalmic solution Place 1 drop  into both eyes daily.   6  . vitamin B-12 (CYANOCOBALAMIN) 1000 MCG tablet Take 1 tablet (1,000 mcg total) by mouth daily.    Marland Kitchen warfarin (COUMADIN) 5 MG tablet TAKE AS DIRECTED BY COUMADIN CLINIC 40 tablet 3  . glucose blood test strip Test blood glucose three times daily. 100 each 0   No current facility-administered medications for this encounter.     BP (!) 116/52   Pulse 69   Wt 170 lb (77.1 kg)   SpO2 98%   BMI 33.20 kg/m  General: NAD Neck: No JVD, no thyromegaly or thyroid nodule.  Lungs: Clear to auscultation bilaterally with normal respiratory effort.  CV: Nondisplaced PMI.  Heart regular S1/S2, no S3/S4, 1/6 SEM RUSB.  1+ edema to knees bilaterally.  No carotid bruit.  Normal pedal pulses.  Abdomen: Soft, nontender, no hepatosplenomegaly, no distention.  Skin: Intact without lesions or rashes.  Neurologic: Alert and oriented x 3.  Psych: Normal affect. Extremities: No clubbing or cyanosis.  HEENT: Normal.    Assessment/Plan: 1. CAD: Status post Promus DES to ostial LCx in 8/14.  Last cath in 1/18 showed stable anatomy.  No chest pain.  - Continue Imdur and Coreg at current doses.  - She is not a ranolazine candidate given use of Dilantin.  - Continue ASA 81 and statin.  2. Diastolic CHF: EF 33-54% on 1/18 echo.  NYHA class II. No JVD but significant peripheral edema.  Weight is down.   - Continue Lasix 60 mg bid and KCl 20 mEq bid.  BMET today.  - She will wear compression stockings.  3. Hyperlipidemia: She is on Zetia and Crestor.  Check lipids today.  4. Carotid bruit: Mild disease on carotid dopplers in 5/18 (improved).  5. H/o GI bleeding: No melena or hematochezia.  6. Complete heart block: S/p St Jude PPM.  7. Paroxysmal atrial fibrillation: She is not in afib today.  Continue warfarin.   Followup in 4 months.   [name] 06/30/2017

## 2017-06-30 NOTE — Patient Instructions (Signed)
EKG today.  Routine lab work today. Will notify you of abnormal results, otherwise no news is good news!  Wear compression stockings as much as tolerated for leg swelling. Remove for bathing and sleeping.  Follow up 4 months. We will call you closer to this time, or you may call our office to schedule 1 month before you are due to be seen. Take all medication as prescribed the day of your appointment. Bring all medications with you to your appointment.  Do the following things EVERYDAY: 1) Weigh yourself in the morning before breakfast. Write it down and keep it in a log. 2) Take your medicines as prescribed 3) Eat low salt foods-Limit salt (sodium) to 2000 mg per day.  4) Stay as active as you can everyday 5) Limit all fluids for the day to less than 2 liters

## 2017-07-17 NOTE — Progress Notes (Signed)
Electrophysiology Office Note   Date:  07/18/2017   ID:  Christine Gay, DOB [birth year], MRN [id number]  PCP:  Seward Carol, MD  Cardiologist:  Aundra Dubin Primary Electrophysiologist:  Shamon Cothran Meredith Leeds, MD    Chief Complaint  Patient presents with  . Pacemaker Check    Complete heart block     History of Present Illness: Christine Gay is a 76 y.o. female who is being seen today for the evaluation of CHB at the request of Seward Carol, MD. Presenting today for electrophysiology evaluation. Past history of diastolic heart failure, coronary disease status post CABG. Presented to the emergency room on 09/28/16 with chest pain at rest resolving after 2 sublingual nitroglycerin. When in the emergency room, she was found to have complete heart block. She had a St. Jude dual chamber pacemaker placed 09/29/16. She has been doing well without major issues. She continues to exercise, walking with her walker. She has lost quite a bit of weight since her maximum weight of 260 pounds.  Today, denies symptoms of palpitations, chest pain, shortness of breath, orthopnea, PND, lower extremity edema, claudication, dizziness, presyncope, syncope, bleeding, or neurologic sequela. The patient is tolerating medications without difficulties.     Past Medical History:  Diagnosis Date  . Anemia    a. Noted on labs 04/2013.  . Anginal pain ([place])   . Arthritis    "legs" (06/10/2014)  . CAD (coronary artery disease)    a. s/p CABG 1992, b. s/p PCI several years ago; c. LHC 11/08: L-LAD ok, S-OM1 ok, S-OM2 CTO, prox to mid CFX 70% (unchanged), RCA non-dominant, occluded;  d.  Echo 2/11: EF 55%, mod LVH, Ao sclerosis;  e. Echo 6/14: mild LVH, mild focal basal sept hypertrophy, EF 55-65%, inf HK, Mild AI, mild MR, mild BAE;  f. Myoview 7/14: int risk-> cath 04/2013 s/p PTCA/DES to prox Cx 05/04/13.  . Chronic diastolic CHF (congestive heart failure) ([place])   . GERD (gastroesophageal reflux disease)   . Glaucoma     . High cholesterol   . Hypertension   . Hypokalemia 02/2017  . Hypothyroidism   . LBBB (left bundle branch block)    chronic  . Myocardial infarction ([place]) 03/1991 X 2; 07/1991  . Obesity   . Seizures ([place])    "I'm on Dilantin" (06/10/2014)  . Sickle cell trait ([place])   . Type II diabetes mellitus ([place])    Past Surgical History:  Procedure Laterality Date  . CARDIAC CATHETERIZATION     "several;  they went up to check"  . CARDIAC CATHETERIZATION N/A 09/28/2016   Procedure: Left Heart Cath and Coronary Angiography;  Surgeon: Peter M Martinique, MD;  Location: [place] [facility];  Service: Cardiovascular;  Laterality: N/A;  . CATARACT EXTRACTION W/ INTRAOCULAR LENS  IMPLANT, BILATERAL Bilateral   . CHOLECYSTECTOMY    . COLONOSCOPY N/A 06/12/2014   Procedure: COLONOSCOPY;  Surgeon: Cleotis Nipper, MD;  Location: [facility] ENDOSCOPY;  Service: Endoscopy;  Laterality: N/A;  . CORONARY ANGIOPLASTY WITH STENT PLACEMENT  ? date; 05/04/2013   ? location; DES  to [place]  . CORONARY ARTERY BYPASS GRAFT  07/1991   "CABG X 3"  . EP IMPLANTABLE DEVICE N/A 09/29/2016   Procedure: Pacemaker Implant;  Surgeon: Amayrani Bennick Meredith Leeds, MD;  Location: [place] [facility] LAB;  Service: Cardiovascular;  Laterality: N/A;  . ESOPHAGOGASTRODUODENOSCOPY N/A 06/12/2014   Procedure: ESOPHAGOGASTRODUODENOSCOPY (EGD);  Surgeon: Cleotis Nipper, MD;  Location: [facility] ENDOSCOPY;  Service: Endoscopy;  Laterality: N/A;  . LEFT AND RIGHT HEART CATHETERIZATION WITH CORONARY ANGIOGRAM N/A 05/04/2013   Procedure: LEFT AND RIGHT HEART CATHETERIZATION WITH CORONARY ANGIOGRAM;  Surgeon: Larey Dresser, MD;  Location: [facility] CATH LAB;  Service: Cardiovascular;  Laterality: N/A;  . PERCUTANEOUS STENT INTERVENTION  05/04/2013   Procedure: PERCUTANEOUS STENT INTERVENTION;  Surgeon: Larey Dresser, MD;  Location: [facility] CATH LAB;  Service: Cardiovascular;;  . TUBAL LIGATION Bilateral 1969     Current Outpatient Prescriptions  Medication Sig Dispense  Refill  . acetaminophen (TYLENOL) 325 MG tablet Take 1-2 tablets (325-650 mg total) by mouth every 4 (four) hours as needed for mild pain.    . carvedilol (COREG) 6.25 MG tablet Take 0.5 tablets (3.125 mg total) by mouth 2 (two) times daily.    . cycloSPORINE (RESTASIS) 0.05 % ophthalmic emulsion Place 1 drop into both eyes 2 (two) times daily.    Marland Kitchen ezetimibe (ZETIA) 10 MG tablet TAKE 1 TABLET BY MOUTH DAILY. 90 tablet 3  . ferrous sulfate 325 (65 FE) MG tablet Take 1 tablet (325 mg total) by mouth 2 (two) times daily with a meal. 90 tablet 0  . furosemide (LASIX) 40 MG tablet Take 1.5 tablets (60 mg total) by mouth 2 (two) times daily. 90 tablet 4  . glucose blood test strip Test blood glucose three times daily. 100 each 0  . ibandronate (BONIVA) 150 MG tablet Take 150 mg by mouth every 30 (thirty) days. Take in the morning with a full glass of water, on an empty stomach, and do not take anything else by mouth or lie down for the next 30 min.    . isosorbide mononitrate (IMDUR) 60 MG 24 hr tablet Take 1 tablet (60 mg total) by mouth 2 (two) times daily. 1 tablet 0  . levothyroxine (SYNTHROID, LEVOTHROID) 100 MCG tablet Take 100 mcg by mouth daily before breakfast.    . metFORMIN (GLUCOPHAGE-XR) 500 MG 24 hr tablet Take 500 mg by mouth 2 (two) times daily.     Marland Kitchen NITROSTAT 0.4 MG SL tablet PLACE 1 TABLET (0.4 MG TOTAL) UNDER THE TONGUE EVERY 5 (FIVE) MINUTES AS NEEDED FOR CHEST PAIN. 25 tablet 11  . pantoprazole (PROTONIX) 40 MG tablet TAKE 1 TABLET BY MOUTH DAILY. 90 tablet 2  . phenytoin (DILANTIN) 100 MG ER capsule Take 100-200 mg by mouth 2 (two) times daily. 200 mg in the morning and 100 mg in the evening    . potassium chloride SA (K-DUR,KLOR-CON) 20 MEQ tablet Take 1 tablet (20 mEq total) by mouth 2 (two) times daily. 60 tablet 3  . rosuvastatin (CRESTOR) 40 MG tablet TAKE 1 TABLET BY MOUTH DAILY. 90 tablet 2  . timolol (TIMOPTIC) 0.5 % ophthalmic solution Place 1 drop into both eyes daily.    6  . warfarin (COUMADIN) 5 MG tablet TAKE AS DIRECTED BY COUMADIN CLINIC 40 tablet 3   No current facility-administered medications for this visit.     Allergies:   Meperidine hcl; Nitroglycerin; and Morphine   Social History:  The patient  reports that she has never smoked. She has never used smokeless tobacco. She reports that she does not drink alcohol or use drugs.   Family History:  The patient's family history includes Diabetes in her mother.    ROS:  Please see the history of present illness.   Otherwise, review of systems is positive for leg swelling, leg pain, cough.   All other systems are reviewed and negative.   PHYSICAL  EXAM: VS:  BP 126/66   Pulse 60   Ht 5' (1.524 m)   Wt 170 lb (77.1 kg)   SpO2 95%   BMI 33.20 kg/m  , BMI Body mass index is 33.2 kg/m. GEN: Well nourished, well developed, in no acute distress  HEENT: normal  Neck: no JVD, carotid bruits, or masses Cardiac: RRR; no murmurs, rubs, or gallops,no edema  Respiratory:  clear to auscultation bilaterally, normal work of breathing GI: soft, nontender, nondistended, + BS MS: no deformity or atrophy  Skin: warm and dry, device site well healed Neuro:  Strength and sensation are intact Psych: euthymic mood, full affect  EKG:  EKG is not ordered today. Personal review of the ekg ordered 06/30/17 shows AV paced, PVCs  Personal review of the device interrogation today. Results in [facility]: 09/28/2016: TSH 2.202 11/19/2016: B Natriuretic Peptide 163.4 11/29/2016: NT-Pro BNP 789 03/09/2017: Magnesium 2.2 03/10/2017: ALT 13; Hemoglobin 11.2; Platelets 158 06/30/2017: BUN 8; Creatinine, Ser 0.98; Potassium 3.6; Sodium 137    Lipid Panel     Component Value Date/Time   CHOL 137 06/30/2017 1154   TRIG 104 06/30/2017 1154   HDL 40 (L) 06/30/2017 1154   CHOLHDL 3.4 06/30/2017 1154   VLDL 21 06/30/2017 1154   LDLCALC 76 06/30/2017 1154     Wt Readings from Last 3 Encounters:  07/18/17 170 lb  (77.1 kg)  06/30/17 170 lb (77.1 kg)  03/08/17 174 lb 14.4 oz (79.3 kg)      Other studies Reviewed: Additional studies/ records that were reviewed today include: TTE 09/29/16  Review of the above records today demonstrates:  - Left ventricle: The cavity size was normal. Wall thickness was   increased in a pattern of mild LVH. Basal anterolateral severe   hypokinesis, basal inferolateral akinesis. Systolic function was   low normal to mildly reduced. The estimated ejection fraction was   in the range of 50% to 55%. Indeterminant diastolic function. - Aortic valve: There was no stenosis. There was mild   regurgitation. - Mitral valve: There was trivial regurgitation. - Left atrium: The atrium was mildly dilated. - Right ventricle: The cavity size was normal. Systolic function   was mildly reduced. - Tricuspid valve: No complete TR doppler jet so unable to estimate   PA systolic pressure. - Pulmonary arteries: PA peak pressure: 33 mm Hg (S). - Inferior vena cava: The vessel was normal in size. The   respirophasic diameter changes were in the normal range (>= 50%),   consistent with normal central venous pressure.  LHC 09/28/16  There is mild left ventricular systolic dysfunction.  LV end diastolic pressure is mildly elevated.  The left ventricular ejection fraction is 45-50% by visual estimate.  Ost RCA to Prox RCA lesion, 100 %stenosed.  LM lesion, 35 %stenosed.  Prox Cx to Mid Cx lesion, 30 %stenosed.  Ost 1st Mrg to 1st Mrg lesion, 100 %stenosed.  Ost 2nd Mrg to 2nd Mrg lesion, 100 %stenosed.  Prox LAD lesion, 100 %stenosed.  LIMA graft was visualized by angiography and is normal in caliber and anatomically normal.  SVG graft was visualized by angiography and is normal in caliber and anatomically normal.  SVG graft was not visualized due to known occlusion.  Origin to Prox Graft lesion, 100 %stenosed.   1. Left dominant circulation 2. Severe 3 vessel occlusive  CAD 3. Patent stent in the proximal to mid LAD 4. Patent SVG to OM1 5. Patent LIMA to  LAD 6. Known occlusion of SVG to OM2 7. Mild LV dysfunction EF 45-50%. 8. Mildly elevated LVEDP.  ASSESSMENT AND PLAN:  1.  Complete AV block: Status post St. Jude dual-chamber pacemaker implanted in 09/29/16. Device functioning appropriately. No changes at this time.   2. Hypertension: Well-controlled today. Continue current management.  3. Coronary artery disease: No current chest pain. Continue current management.  4. Diastolic heart failure: Found on echo January 2018. No signs of volume overload.  5. Paroxysmal atrial fibrillation: Has had minimal amounts of atrial fibrillation. Patient is asymptomatic. Found on device interrogation. Continue Coumadin.  This patients CHA2DS2-VASc Score and unadjusted Ischemic Stroke Rate (% per year) is equal to 7.2 % stroke rate/year from a score of 5  Above score calculated as 1 point each if present [CHF, HTN, DM, Vascular=MI/PAD/Aortic Plaque, Age if 65-74, or Female] Above score calculated as 2 points each if present [Age > 75, or Stroke/TIA/TE]   Current medicines are reviewed at length with the patient today.   The patient does not have concerns regarding her medicines.  The following changes were made today:  Start Eliquis  Labs/ tests ordered today include:  No orders of the defined types were placed in this encounter.    Disposition:   FU with Brenlynn Fake 6 months  Signed, Zhana Jeangilles Meredith Leeds, MD  07/18/2017 12:13 PM     [address] [place] [address] [phone] (office) [phone] (fax)

## 2017-07-18 ENCOUNTER — Encounter: Payer: Self-pay | Admitting: Cardiology

## 2017-07-18 ENCOUNTER — Ambulatory Visit (INDEPENDENT_AMBULATORY_CARE_PROVIDER_SITE_OTHER): Payer: Medicare Other | Admitting: Cardiology

## 2017-07-18 ENCOUNTER — Encounter (INDEPENDENT_AMBULATORY_CARE_PROVIDER_SITE_OTHER): Payer: Self-pay

## 2017-07-18 ENCOUNTER — Ambulatory Visit (INDEPENDENT_AMBULATORY_CARE_PROVIDER_SITE_OTHER): Payer: Medicare Other | Admitting: *Deleted

## 2017-07-18 VITALS — BP 126/66 | HR 60 | Ht 60.0 in | Wt 170.0 lb

## 2017-07-18 DIAGNOSIS — I442 Atrioventricular block, complete: Secondary | ICD-10-CM

## 2017-07-18 DIAGNOSIS — Z5181 Encounter for therapeutic drug level monitoring: Secondary | ICD-10-CM | POA: Diagnosis not present

## 2017-07-18 DIAGNOSIS — I25810 Atherosclerosis of coronary artery bypass graft(s) without angina pectoris: Secondary | ICD-10-CM | POA: Diagnosis not present

## 2017-07-18 DIAGNOSIS — Z950 Presence of cardiac pacemaker: Secondary | ICD-10-CM | POA: Diagnosis not present

## 2017-07-18 DIAGNOSIS — I5032 Chronic diastolic (congestive) heart failure: Secondary | ICD-10-CM

## 2017-07-18 DIAGNOSIS — I10 Essential (primary) hypertension: Secondary | ICD-10-CM

## 2017-07-18 DIAGNOSIS — I4891 Unspecified atrial fibrillation: Secondary | ICD-10-CM

## 2017-07-18 DIAGNOSIS — Z23 Encounter for immunization: Secondary | ICD-10-CM | POA: Diagnosis not present

## 2017-07-18 LAB — CUP PACEART INCLINIC DEVICE CHECK
Date Time Interrogation Session: 20181022153626
Implantable Lead Implant Date: 20180103
Implantable Lead Location: 753859
Lead Channel Impedance Value: 437.5 Ohm
Lead Channel Pacing Threshold Amplitude: 0.5 V
Lead Channel Pacing Threshold Amplitude: 0.5 V
Lead Channel Pacing Threshold Pulse Width: 0.5 ms
Lead Channel Pacing Threshold Pulse Width: 0.5 ms
Lead Channel Sensing Intrinsic Amplitude: 4.5 mV
Lead Channel Setting Pacing Amplitude: 2 V
Lead Channel Setting Pacing Amplitude: 2.5 V
Lead Channel Setting Pacing Pulse Width: 0.5 ms
Lead Channel Setting Sensing Sensitivity: 2 mV
MDC IDC LEAD IMPLANT DT: 20180103
MDC IDC LEAD LOCATION: 753860
MDC IDC MSMT BATTERY REMAINING LONGEVITY: 100 mo
MDC IDC MSMT BATTERY VOLTAGE: 3.01 V
MDC IDC MSMT LEADCHNL RA PACING THRESHOLD AMPLITUDE: 0.5 V
MDC IDC MSMT LEADCHNL RA PACING THRESHOLD PULSEWIDTH: 0.5 ms
MDC IDC MSMT LEADCHNL RA PACING THRESHOLD PULSEWIDTH: 0.5 ms
MDC IDC MSMT LEADCHNL RV IMPEDANCE VALUE: 487.5 Ohm
MDC IDC MSMT LEADCHNL RV PACING THRESHOLD AMPLITUDE: 0.5 V
MDC IDC MSMT LEADCHNL RV SENSING INTR AMPL: 9 mV
MDC IDC PG IMPLANT DT: 20180103
MDC IDC PG SERIAL: 7988263
MDC IDC STAT BRADY RA PERCENT PACED: 32 %
MDC IDC STAT BRADY RV PERCENT PACED: 97 %
Pulse Gen Model: 2272

## 2017-07-18 LAB — POCT INR: INR: 2.4

## 2017-07-18 NOTE — Progress Notes (Signed)
Remote pacemaker transmission.   

## 2017-07-18 NOTE — Patient Instructions (Signed)
Medication Instructions:  Your physician recommends that you continue on your current medications as directed. Please refer to the Current Medication list given to you today.  Labwork: None ordered  Testing/Procedures: None ordered  Follow-Up: Remote monitoring is used to monitor your Pacemaker or ICD from home. This monitoring reduces the number of office visits required to check your device to one time per year. It allows [place] to keep an eye on the functioning of your device to ensure it is working properly. You are scheduled for a device check from home on 10/17/2017. You may send your transmission at any time that day. If you have a wireless device, the transmission will be sent automatically. After your physician reviews your transmission, you will receive a postcard with your next transmission date.  Your physician wants you to follow-up in: 6 months with Dr. Curt Bears.  You will receive a reminder letter in the mail two months in advance. If you don't receive a letter, please call our office to schedule the follow-up appointment.  --- If you need a refill on your cardiac medications before your next appointment, please call your pharmacy. ---  Thank you for choosing [facility]!!   Trinidad Curet, RN [phone]  Any Other Special Instructions Will Be Listed Below (If Applicable).  You received your flu shot while in the office today.

## 2017-07-20 LAB — CUP PACEART REMOTE DEVICE CHECK
Battery Voltage: 3.01 V
Brady Statistic AP VP Percent: 34 %
Brady Statistic AP VS Percent: 1 %
Brady Statistic AS VS Percent: 1 %
Brady Statistic RV Percent Paced: 97 %
Implantable Lead Implant Date: 20180103
Implantable Lead Location: 753860
Lead Channel Impedance Value: 560 Ohm
Lead Channel Pacing Threshold Amplitude: 0.5 V
Lead Channel Pacing Threshold Amplitude: 0.5 V
Lead Channel Pacing Threshold Pulse Width: 0.5 ms
Lead Channel Sensing Intrinsic Amplitude: 5 mV
Lead Channel Setting Pacing Amplitude: 2 V
Lead Channel Setting Pacing Amplitude: 2.5 V
Lead Channel Setting Pacing Pulse Width: 0.5 ms
Lead Channel Setting Sensing Sensitivity: 2 mV
MDC IDC LEAD IMPLANT DT: 20180103
MDC IDC LEAD LOCATION: 753859
MDC IDC MSMT BATTERY REMAINING LONGEVITY: 102 mo
MDC IDC MSMT BATTERY REMAINING PERCENTAGE: 95.5 %
MDC IDC MSMT LEADCHNL RA IMPEDANCE VALUE: 460 Ohm
MDC IDC MSMT LEADCHNL RA PACING THRESHOLD PULSEWIDTH: 0.5 ms
MDC IDC MSMT LEADCHNL RV SENSING INTR AMPL: 12 mV
MDC IDC PG IMPLANT DT: 20180103
MDC IDC PG SERIAL: 7988263
MDC IDC SESS DTM: 20181022060018
MDC IDC STAT BRADY AS VP PERCENT: 63 %
MDC IDC STAT BRADY RA PERCENT PACED: 32 %

## 2017-07-22 ENCOUNTER — Encounter: Payer: Self-pay | Admitting: Cardiology

## 2017-08-03 ENCOUNTER — Other Ambulatory Visit: Payer: Self-pay | Admitting: Internal Medicine

## 2017-08-03 ENCOUNTER — Ambulatory Visit
Admission: RE | Admit: 2017-08-03 | Discharge: 2017-08-03 | Disposition: A | Discharge status: Home / Self Care | Payer: Medicare Other | Source: Ambulatory Visit | Attending: Internal Medicine | Admitting: Internal Medicine

## 2017-08-03 DIAGNOSIS — S0093XA Contusion of unspecified part of head, initial encounter: Secondary | ICD-10-CM

## 2017-08-10 ENCOUNTER — Ambulatory Visit: Payer: Medicare Other | Admitting: Podiatry

## 2017-08-15 ENCOUNTER — Ambulatory Visit (INDEPENDENT_AMBULATORY_CARE_PROVIDER_SITE_OTHER): Payer: Medicare Other | Admitting: Pharmacist

## 2017-08-15 DIAGNOSIS — Z5181 Encounter for therapeutic drug level monitoring: Secondary | ICD-10-CM

## 2017-08-15 DIAGNOSIS — I4891 Unspecified atrial fibrillation: Secondary | ICD-10-CM

## 2017-08-15 LAB — POCT INR: INR: 2.2

## 2017-08-15 NOTE — Patient Instructions (Signed)
Continue taking 1 tablet daily except 1.5 tablets only on Fridays. Recheck INR in 4 weeks. Call Coumadin clinic with any concerns [phone].

## 2017-08-17 ENCOUNTER — Other Ambulatory Visit (HOSPITAL_COMMUNITY): Payer: Self-pay | Admitting: Cardiology

## 2017-08-19 ENCOUNTER — Other Ambulatory Visit: Payer: Self-pay | Admitting: Cardiology

## 2017-09-01 DIAGNOSIS — H401132 Primary open-angle glaucoma, bilateral, moderate stage: Secondary | ICD-10-CM | POA: Diagnosis not present

## 2017-09-01 DIAGNOSIS — H353132 Nonexudative age-related macular degeneration, bilateral, intermediate dry stage: Secondary | ICD-10-CM | POA: Diagnosis not present

## 2017-09-14 ENCOUNTER — Other Ambulatory Visit: Payer: Self-pay | Admitting: Cardiology

## 2017-09-21 ENCOUNTER — Ambulatory Visit: Payer: Medicare Other | Admitting: Podiatry

## 2017-09-22 ENCOUNTER — Other Ambulatory Visit: Payer: Self-pay | Admitting: Cardiology

## 2017-09-29 ENCOUNTER — Ambulatory Visit (INDEPENDENT_AMBULATORY_CARE_PROVIDER_SITE_OTHER): Payer: Medicare Other | Admitting: *Deleted

## 2017-09-29 DIAGNOSIS — Z5181 Encounter for therapeutic drug level monitoring: Secondary | ICD-10-CM

## 2017-09-29 DIAGNOSIS — I4891 Unspecified atrial fibrillation: Secondary | ICD-10-CM | POA: Diagnosis not present

## 2017-09-29 LAB — POCT INR: INR: 2.6

## 2017-09-29 NOTE — Patient Instructions (Signed)
Description   Continue taking 1 tablet daily except 1.5 tablets only on Fridays. Recheck INR in 5 weeks. Call Coumadin clinic with any concerns [phone].

## 2017-09-30 ENCOUNTER — Ambulatory Visit (INDEPENDENT_AMBULATORY_CARE_PROVIDER_SITE_OTHER): Payer: Medicare Other | Admitting: Podiatry

## 2017-09-30 ENCOUNTER — Encounter: Payer: Self-pay | Admitting: Podiatry

## 2017-09-30 DIAGNOSIS — M2012 Hallux valgus (acquired), left foot: Secondary | ICD-10-CM

## 2017-09-30 DIAGNOSIS — E1140 Type 2 diabetes mellitus with diabetic neuropathy, unspecified: Secondary | ICD-10-CM | POA: Diagnosis not present

## 2017-09-30 DIAGNOSIS — D689 Coagulation defect, unspecified: Secondary | ICD-10-CM

## 2017-09-30 DIAGNOSIS — M2041 Other hammer toe(s) (acquired), right foot: Secondary | ICD-10-CM

## 2017-09-30 DIAGNOSIS — B351 Tinea unguium: Secondary | ICD-10-CM

## 2017-09-30 DIAGNOSIS — M79676 Pain in unspecified toe(s): Secondary | ICD-10-CM | POA: Diagnosis not present

## 2017-09-30 DIAGNOSIS — M2042 Other hammer toe(s) (acquired), left foot: Secondary | ICD-10-CM

## 2017-09-30 NOTE — Progress Notes (Signed)
Patient ID: Christine Gay, female   DOB: 02-20-1941, 77 y.o.   MRN: [id number] Complaint:  Visit Type: Patient returns to my office for continued preventative foot care services. Complaint: Patient states" my nails have grown long and thick and become painful to walk and wear shoes" Patient has been diagnosed with DM with no foot complications. The patient presents for preventative foot care services. No changes to ROS  Podiatric Exam: Vascular: dorsalis pedis and posterior tibial pulses are palpable bilateral. Capillary return is immediate. Temperature gradient is WNL. Skin turgor WNL  Sensorium: Diminished  Semmes Weinstein monofilament test. Normal tactile sensation bilaterally. Nail Exam: Pt has thick disfigured discolored nails with subungual debris noted bilateral entire nail hallux through fifth toenails Ulcer Exam: There is no evidence of ulcer or pre-ulcerative changes or infection. Orthopedic Exam: Muscle tone and strength are WNL. No limitations in general ROM. No crepitus or effusions noted. Foot type and digits show no abnormalities. Bony prominences are unremarkable. HAV 1st  MPJ B/L.  Hammer toes 2 B/l Skin: No Porokeratosis. No infection or ulcers.  Asymptomatic Callus sub 5th B/L  Diagnosis:  Onychomycosis, , Pain in right toe, pain in left toes, Debride porokeratosis B/l  Treatment & Plan Procedures and Treatment: Consent by patient was obtained for treatment procedures. The patient understood the discussion of treatment and procedures well. All questions were answered thoroughly reviewed. Debridement of mycotic and hypertrophic toenails, 1 through 5 bilateral and clearing of subungual debris. No ulceration, no infection noted. . Return Visit-Office Procedure: Patient instructed to return to the office for a follow up visit 3 months for continued evaluation and treatment.  Gardiner Barefoot DPM

## 2017-10-17 ENCOUNTER — Telehealth: Payer: Self-pay | Admitting: Cardiology

## 2017-10-17 ENCOUNTER — Ambulatory Visit (INDEPENDENT_AMBULATORY_CARE_PROVIDER_SITE_OTHER): Payer: Medicare Other | Admitting: *Deleted

## 2017-10-17 DIAGNOSIS — I442 Atrioventricular block, complete: Secondary | ICD-10-CM | POA: Diagnosis not present

## 2017-10-17 NOTE — Progress Notes (Signed)
Remote pacemaker transmission.   

## 2017-10-17 NOTE — Telephone Encounter (Signed)
Spoke with pt and reminded pt of remote transmission that is due today. Pt verbalized understanding.   

## 2017-10-18 ENCOUNTER — Encounter: Payer: Self-pay | Admitting: Cardiology

## 2017-11-03 ENCOUNTER — Ambulatory Visit (INDEPENDENT_AMBULATORY_CARE_PROVIDER_SITE_OTHER): Payer: Medicare Other | Admitting: *Deleted

## 2017-11-03 ENCOUNTER — Telehealth: Payer: Self-pay | Admitting: Cardiology

## 2017-11-03 DIAGNOSIS — I4891 Unspecified atrial fibrillation: Secondary | ICD-10-CM

## 2017-11-03 DIAGNOSIS — Z5181 Encounter for therapeutic drug level monitoring: Secondary | ICD-10-CM

## 2017-11-03 LAB — PROTIME-INR
INR: 8.4 (ref 0.8–1.2)
PROTHROMBIN TIME: 89.2 s — AB (ref 9.1–12.0)

## 2017-11-03 LAB — POCT INR: INR: >8

## 2017-11-03 NOTE — Patient Instructions (Signed)
Description   Spoke with patient & instructed pt to hold Coumadin today (2/7), tomorrow (2/8), Saturday (2/9), and Sunday (2/10). Report to ER with any bleeding, falls, or accidents. Recheck INR on Monday. [facility]madin Clinic [phone] Main [phone]

## 2017-11-03 NOTE — Telephone Encounter (Signed)
New Message   Patient is returning call that she believes is from coumadin clinic in reference to her visit today. Please call to discuss.

## 2017-11-03 NOTE — Telephone Encounter (Signed)
Returned call to the pt, refer to Anticoagulation Encounter.

## 2017-11-07 ENCOUNTER — Ambulatory Visit (INDEPENDENT_AMBULATORY_CARE_PROVIDER_SITE_OTHER): Payer: Medicare Other

## 2017-11-07 DIAGNOSIS — I4891 Unspecified atrial fibrillation: Secondary | ICD-10-CM | POA: Diagnosis not present

## 2017-11-07 DIAGNOSIS — Z5181 Encounter for therapeutic drug level monitoring: Secondary | ICD-10-CM | POA: Diagnosis not present

## 2017-11-07 LAB — POCT INR: INR: 2.1

## 2017-11-07 NOTE — Patient Instructions (Signed)
Description   Resume previous dosage 1 tablet daily except 1.5 tablets on Fridays.  Recheck in 2 weeks. Coumadin Clinic [phone] Main [phone]

## 2017-11-09 LAB — CUP PACEART REMOTE DEVICE CHECK
Battery Remaining Percentage: 95.5 %
Brady Statistic AP VP Percent: 18 %
Brady Statistic AP VS Percent: 1 %
Brady Statistic AS VP Percent: 81 %
Brady Statistic AS VS Percent: 1 %
Brady Statistic RA Percent Paced: 17 %
Brady Statistic RV Percent Paced: 99 %
Implantable Lead Implant Date: 20180103
Implantable Lead Location: 753859
Lead Channel Pacing Threshold Amplitude: 0.5 V
Lead Channel Pacing Threshold Pulse Width: 0.5 ms
Lead Channel Sensing Intrinsic Amplitude: 12 mV
Lead Channel Sensing Intrinsic Amplitude: 5 mV
Lead Channel Setting Pacing Amplitude: 2 V
Lead Channel Setting Pacing Amplitude: 2.5 V
Lead Channel Setting Pacing Pulse Width: 0.5 ms
Lead Channel Setting Sensing Sensitivity: 2 mV
MDC IDC LEAD IMPLANT DT: 20180103
MDC IDC LEAD LOCATION: 753860
MDC IDC MSMT BATTERY REMAINING LONGEVITY: 109 mo
MDC IDC MSMT BATTERY VOLTAGE: 3.01 V
MDC IDC MSMT LEADCHNL RA IMPEDANCE VALUE: 430 Ohm
MDC IDC MSMT LEADCHNL RA PACING THRESHOLD AMPLITUDE: 0.5 V
MDC IDC MSMT LEADCHNL RA PACING THRESHOLD PULSEWIDTH: 0.5 ms
MDC IDC MSMT LEADCHNL RV IMPEDANCE VALUE: 510 Ohm
MDC IDC PG IMPLANT DT: 20180103
MDC IDC PG SERIAL: 7988263
MDC IDC SESS DTM: 20190121202711

## 2017-11-21 ENCOUNTER — Other Ambulatory Visit: Payer: Self-pay | Admitting: Cardiology

## 2017-11-21 ENCOUNTER — Ambulatory Visit (INDEPENDENT_AMBULATORY_CARE_PROVIDER_SITE_OTHER): Payer: Medicare Other | Admitting: Pharmacist

## 2017-11-21 DIAGNOSIS — I4891 Unspecified atrial fibrillation: Secondary | ICD-10-CM | POA: Diagnosis not present

## 2017-11-21 DIAGNOSIS — Z5181 Encounter for therapeutic drug level monitoring: Secondary | ICD-10-CM | POA: Diagnosis not present

## 2017-11-21 LAB — POCT INR: INR: 3.1

## 2017-11-21 NOTE — Patient Instructions (Signed)
Take 1/2 tablet today, then resume previous dosage 1 tablet daily except 1.5 tablets on Fridays.  Recheck in 3 weeks. Coumadin Clinic [phone] Main [phone]

## 2017-11-24 ENCOUNTER — Other Ambulatory Visit: Payer: Self-pay | Admitting: *Deleted

## 2017-11-28 ENCOUNTER — Other Ambulatory Visit: Payer: Self-pay | Admitting: Cardiology

## 2017-11-29 ENCOUNTER — Other Ambulatory Visit: Payer: Self-pay | Admitting: *Deleted

## 2017-11-30 ENCOUNTER — Other Ambulatory Visit (HOSPITAL_COMMUNITY): Payer: Self-pay | Admitting: *Deleted

## 2017-11-30 MED ORDER — PANTOPRAZOLE SODIUM 40 MG PO TBEC: 40.0000 mg | DELAYED_RELEASE_TABLET | Freq: Every day | ORAL | 2 refills | Status: DC

## 2017-12-15 ENCOUNTER — Other Ambulatory Visit (HOSPITAL_COMMUNITY): Payer: Self-pay | Admitting: Cardiology

## 2017-12-19 ENCOUNTER — Other Ambulatory Visit (HOSPITAL_COMMUNITY): Payer: Self-pay | Admitting: Cardiology

## 2017-12-26 ENCOUNTER — Ambulatory Visit (INDEPENDENT_AMBULATORY_CARE_PROVIDER_SITE_OTHER): Payer: Medicare Other | Admitting: Pharmacist

## 2017-12-26 DIAGNOSIS — Z5181 Encounter for therapeutic drug level monitoring: Secondary | ICD-10-CM | POA: Diagnosis not present

## 2017-12-26 DIAGNOSIS — I4891 Unspecified atrial fibrillation: Secondary | ICD-10-CM

## 2017-12-26 LAB — POCT INR: INR: 3.3

## 2017-12-26 NOTE — Patient Instructions (Signed)
Description   Skip your Coumadin today, then resume previous dosage 1 tablet daily except 1.5 tablets on Fridays.  Recheck in 3 weeks. Coumadin Clinic [phone] Main [phone]

## 2017-12-28 ENCOUNTER — Ambulatory Visit (INDEPENDENT_AMBULATORY_CARE_PROVIDER_SITE_OTHER): Payer: Medicare Other | Admitting: Podiatry

## 2017-12-28 ENCOUNTER — Encounter: Payer: Self-pay | Admitting: Podiatry

## 2017-12-28 DIAGNOSIS — E1140 Type 2 diabetes mellitus with diabetic neuropathy, unspecified: Secondary | ICD-10-CM

## 2017-12-28 DIAGNOSIS — B351 Tinea unguium: Secondary | ICD-10-CM

## 2017-12-28 DIAGNOSIS — M2012 Hallux valgus (acquired), left foot: Secondary | ICD-10-CM

## 2017-12-28 DIAGNOSIS — D689 Coagulation defect, unspecified: Secondary | ICD-10-CM

## 2017-12-28 DIAGNOSIS — M79676 Pain in unspecified toe(s): Secondary | ICD-10-CM | POA: Diagnosis not present

## 2017-12-28 DIAGNOSIS — M2042 Other hammer toe(s) (acquired), left foot: Secondary | ICD-10-CM

## 2017-12-28 DIAGNOSIS — M2041 Other hammer toe(s) (acquired), right foot: Secondary | ICD-10-CM

## 2017-12-28 NOTE — Progress Notes (Addendum)
Patient ID: Christine Gay, female   DOB: 06/07/1941, 77 y.o.   MRN: [id number] Complaint:  Visit Type: Patient returns to my office for continued preventative foot care services. Complaint: Patient states" my nails have grown long and thick and become painful to walk and wear shoes" Patient has been diagnosed with DM with no foot complications. The patient presents for preventative foot care services. No changes to ROS  Podiatric Exam: Vascular: dorsalis pedis and posterior tibial pulses are palpable bilateral. Capillary return is immediate. Temperature gradient is WNL. Skin turgor WNL  Sensorium: Diminished  Semmes Weinstein monofilament test. Normal tactile sensation bilaterally. Nail Exam: Pt has thick disfigured discolored nails with subungual debris noted bilateral entire nail hallux through fifth toenails Ulcer Exam: There is no evidence of ulcer or pre-ulcerative changes or infection. Orthopedic Exam: Muscle tone and strength are WNL. No limitations in general ROM. No crepitus or effusions noted. Foot type and digits show no abnormalities. Bony prominences are unremarkable. HAV 1st  MPJ B/L.  Hammer toes 2 B/l Skin: No Porokeratosis. No infection or ulcers.  Asymptomatic Callus sub 5th B/L  Diagnosis:  Onychomycosis, , Pain in right toe, pain in left toes, Debride porokeratosis B/l  Treatment & Plan Procedures and Treatment: Consent by patient was obtained for treatment procedures. The patient understood the discussion of treatment and procedures well. All questions were answered thoroughly reviewed. Debridement of mycotic and hypertrophic toenails, 1 through 5 bilateral and clearing of subungual debris. No ulceration, no infection noted. To check on diabetic shoes next visit.. Filled out paperwork for Diabetic shoes for DPN amd HAV and hammer toes. Return Visit-Office Procedure: Patient instructed to return to the office for a follow up visit 3 months for continued evaluation and  treatment.  Gardiner Barefoot DPM

## 2018-01-02 ENCOUNTER — Other Ambulatory Visit: Payer: Self-pay | Admitting: Cardiology

## 2018-01-05 DIAGNOSIS — H401132 Primary open-angle glaucoma, bilateral, moderate stage: Secondary | ICD-10-CM | POA: Diagnosis not present

## 2018-01-11 ENCOUNTER — Encounter: Payer: Self-pay | Admitting: Cardiology

## 2018-01-11 ENCOUNTER — Ambulatory Visit (INDEPENDENT_AMBULATORY_CARE_PROVIDER_SITE_OTHER): Payer: Medicare Other | Admitting: Cardiology

## 2018-01-11 VITALS — BP 110/72 | HR 67 | Ht 60.0 in | Wt 161.6 lb

## 2018-01-11 DIAGNOSIS — I5032 Chronic diastolic (congestive) heart failure: Secondary | ICD-10-CM | POA: Diagnosis not present

## 2018-01-11 DIAGNOSIS — I2510 Atherosclerotic heart disease of native coronary artery without angina pectoris: Secondary | ICD-10-CM | POA: Diagnosis not present

## 2018-01-11 DIAGNOSIS — I442 Atrioventricular block, complete: Secondary | ICD-10-CM | POA: Diagnosis not present

## 2018-01-11 DIAGNOSIS — I480 Paroxysmal atrial fibrillation: Secondary | ICD-10-CM

## 2018-01-11 DIAGNOSIS — I10 Essential (primary) hypertension: Secondary | ICD-10-CM | POA: Diagnosis not present

## 2018-01-11 LAB — CUP PACEART INCLINIC DEVICE CHECK
Battery Remaining Longevity: 133 mo
Battery Voltage: 3.01 V
Brady Statistic RA Percent Paced: 14 %
Implantable Lead Implant Date: 20180103
Implantable Lead Location: 753859
Implantable Pulse Generator Implant Date: 20180103
Lead Channel Impedance Value: 400 Ohm
Lead Channel Pacing Threshold Pulse Width: 0.5 ms
Lead Channel Sensing Intrinsic Amplitude: 4.1 mV
Lead Channel Setting Pacing Amplitude: 0.75 V
Lead Channel Setting Pacing Amplitude: 1.625
Lead Channel Setting Pacing Pulse Width: 0.5 ms
MDC IDC LEAD IMPLANT DT: 20180103
MDC IDC LEAD LOCATION: 753860
MDC IDC MSMT LEADCHNL RA PACING THRESHOLD AMPLITUDE: 0.625 V
MDC IDC MSMT LEADCHNL RA PACING THRESHOLD PULSEWIDTH: 0.5 ms
MDC IDC MSMT LEADCHNL RV IMPEDANCE VALUE: 487.5 Ohm
MDC IDC MSMT LEADCHNL RV PACING THRESHOLD AMPLITUDE: 0.5 V
MDC IDC MSMT LEADCHNL RV SENSING INTR AMPL: 12 mV
MDC IDC PG SERIAL: 7988263
MDC IDC SESS DTM: 20190417112529
MDC IDC SET LEADCHNL RV SENSING SENSITIVITY: 2 mV
MDC IDC STAT BRADY RV PERCENT PACED: 99.04 %

## 2018-01-11 NOTE — Progress Notes (Signed)
Electrophysiology Office Note   Date:  01/11/2018   ID:  Christine Gay, DOB [birth year], MRN [id number]  PCP:  Seward Carol, MD  Cardiologist:  Aundra Dubin Primary Electrophysiologist:  Jereline Ticer Meredith Leeds, MD    Chief Complaint  Patient presents with  . Pacemaker Check    Complete heart block/PAF     History of Present Illness: Christine Gay is a 77 y.o. female who is being seen today for the evaluation of CHB at the request of Seward Carol, MD. Presenting today for electrophysiology evaluation. Past history of diastolic heart failure, coronary disease status post CABG. Presented to the emergency room on 09/28/16 with chest pain at rest resolving after 2 sublingual nitroglycerin. When in the emergency room, she was found to have complete heart block. She had a St. Jude dual chamber pacemaker placed 09/29/16. She has been doing well without major issues. She continues to exercise, walking with her walker. She has lost quite a bit of weight since her maximum weight of 260 pounds.  Today, denies symptoms of palpitations, chest pain, shortness of breath, orthopnea, PND, lower extremity edema, claudication, dizziness, presyncope, syncope, bleeding, or neurologic sequela. The patient is tolerating medications without difficulties.  She had 4 seconds of atrial tachycardia on her device interrogation.  She was unaware of this. She otherwise feels well without compliant.    Past Medical History:  Diagnosis Date  . Anemia    a. Noted on labs 04/2013.  . Anginal pain ([place])   . Arthritis    "legs" (06/10/2014)  . CAD (coronary artery disease)    a. s/p CABG 1992, b. s/p PCI several years ago; c. LHC 11/08: L-LAD ok, S-OM1 ok, S-OM2 CTO, prox to mid CFX 70% (unchanged), RCA non-dominant, occluded;  d.  Echo 2/11: EF 55%, mod LVH, Ao sclerosis;  e. Echo 6/14: mild LVH, mild focal basal sept hypertrophy, EF 55-65%, inf HK, Mild AI, mild MR, mild BAE;  f. Myoview 7/14: int risk-> cath 04/2013 s/p  PTCA/DES to prox Cx 05/04/13.  . Chronic diastolic CHF (congestive heart failure) ([place])   . GERD (gastroesophageal reflux disease)   . Glaucoma   . High cholesterol   . Hypertension   . Hypokalemia 02/2017  . Hypothyroidism   . LBBB (left bundle branch block)    chronic  . Myocardial infarction ([place]) 03/1991 X 2; 07/1991  . Obesity   . Seizures ([place])    "I'm on Dilantin" (06/10/2014)  . Sickle cell trait ([place])   . Type II diabetes mellitus ([place])    Past Surgical History:  Procedure Laterality Date  . CARDIAC CATHETERIZATION     "several;  they went up to check"  . CARDIAC CATHETERIZATION N/A 09/28/2016   Procedure: Left Heart Cath and Coronary Angiography;  Surgeon: Peter M Martinique, MD;  Location: [facility];  Service: Cardiovascular;  Laterality: N/A;  . CATARACT EXTRACTION W/ INTRAOCULAR LENS  IMPLANT, BILATERAL Bilateral   . CHOLECYSTECTOMY    . COLONOSCOPY N/A 06/12/2014   Procedure: COLONOSCOPY;  Surgeon: Cleotis Nipper, MD;  Location[facility] ENDOSCOPY;  Service: Endoscopy;  Laterality: N/A;  . CORONARY ANGIOPLASTY WITH STENT PLACEMENT  ? date; 05/04/2013   ? location; DES  to [place]  . CORONARY ARTERY BYPASS GRAFT  07/1991   "CABG X 3"  . EP IMPLANTABLE DEVICE N/A 09/29/2016   Procedure: Pacemaker Implant;  Surgeon: Ean Gettel Meredith Leeds, MD;  Location: [place] CV LAB;  Service: Cardiovascular;  Laterality: N/A;  .  ESOPHAGOGASTRODUODENOSCOPY N/A 06/12/2014   Procedure: ESOPHAGOGASTRODUODENOSCOPY (EGD);  Surgeon: Cleotis Nipper, MD;  Location: [facility] ENDOSCOPY;  Service: Endoscopy;  Laterality: N/A;  . LEFT AND RIGHT HEART CATHETERIZATION WITH CORONARY ANGIOGRAM N/A 05/04/2013   Procedure: LEFT AND RIGHT HEART CATHETERIZATION WITH CORONARY ANGIOGRAM;  Surgeon: Larey Dresser, MD;  Location: [facility] CATH LAB;  Service: Cardiovascular;  Laterality: N/A;  . PERCUTANEOUS STENT INTERVENTION  05/04/2013   Procedure: PERCUTANEOUS STENT INTERVENTION;  Surgeon: Larey Dresser, MD;   Location: [facility] CATH LAB;  Service: Cardiovascular;;  . TUBAL LIGATION Bilateral 1969     Current Outpatient Medications  Medication Sig Dispense Refill  . acetaminophen (TYLENOL) 325 MG tablet Take 1-2 tablets (325-650 mg total) by mouth every 4 (four) hours as needed for mild pain.    . carvedilol (COREG) 6.25 MG tablet Take 0.5 tablets (3.125 mg total) by mouth 2 (two) times daily.    . cycloSPORINE (RESTASIS) 0.05 % ophthalmic emulsion Place 1 drop into both eyes 2 (two) times daily.    Marland Kitchen ezetimibe (ZETIA) 10 MG tablet TAKE 1 TABLET BY MOUTH DAILY. 90 tablet 3  . ferrous sulfate 325 (65 FE) MG tablet Take 1 tablet (325 mg total) by mouth 2 (two) times daily with a meal. 90 tablet 0  . furosemide (LASIX) 40 MG tablet Take 1.5 tablets (60 mg total) by mouth 2 (two) times daily. 180 tablet 4  . glucose blood test strip Test blood glucose three times daily. 100 each 0  . ibandronate (BONIVA) 150 MG tablet Take 150 mg by mouth every 30 (thirty) days. Take in the morning with a full glass of water, on an empty stomach, and do not take anything else by mouth or lie down for the next 30 min.    . isosorbide mononitrate (IMDUR) 60 MG 24 hr tablet Take 1 tablet (60 mg total) by mouth 2 (two) times daily. 360 tablet 0  . levothyroxine (SYNTHROID, LEVOTHROID) 100 MCG tablet Take 100 mcg by mouth daily before breakfast.    . metFORMIN (GLUCOPHAGE-XR) 500 MG 24 hr tablet Take 500 mg by mouth 2 (two) times daily.     Marland Kitchen NITROSTAT 0.4 MG SL tablet PLACE 1 TABLET (0.4 MG TOTAL) UNDER THE TONGUE EVERY 5 (FIVE) MINUTES AS NEEDED FOR CHEST PAIN. 25 tablet 11  . pantoprazole (PROTONIX) 40 MG tablet Take 1 tablet (40 mg total) by mouth daily. 90 tablet 2  . phenytoin (DILANTIN) 100 MG ER capsule Take 100-200 mg by mouth 2 (two) times daily. 200 mg in the morning and 100 mg in the evening    . potassium chloride SA (K-DUR,KLOR-CON) 20 MEQ tablet TAKE 1 TABLET BY MOUTH 2 TIMES DAILY. 60 tablet 3  . rosuvastatin  (CRESTOR) 40 MG tablet TAKE 1 TABLET BY MOUTH DAILY 90 tablet 2  . timolol (TIMOPTIC) 0.5 % ophthalmic solution Place 1 drop into both eyes daily.   6  . warfarin (COUMADIN) 5 MG tablet TAKE AS DIRECTED BY COUMADIN CLINIC 40 tablet 3   No current facility-administered medications for this visit.     Allergies:   Meperidine hcl; Nitroglycerin; and Morphine   Social History:  The patient  reports that she has never smoked. She has never used smokeless tobacco. She reports that she does not drink alcohol or use drugs.   Family History:  The patient's family history includes Diabetes in her mother.    ROS:  Please see the history of present illness.   Otherwise, review of  systems is positive for type change, leg swelling, shortness of breath, balance problems, easy bruising.   All other systems are reviewed and negative.   PHYSICAL EXAM: VS:  BP 110/72   Pulse 67   Ht 5' (1.524 m)   Wt 161 lb 9.6 oz (73.3 kg)   SpO2 98%   BMI 31.56 kg/m  , BMI Body mass index is 31.56 kg/m. GEN: Well nourished, well developed, in no acute distress  HEENT: normal  Neck: no JVD, carotid bruits, or masses Cardiac: RRR; no murmurs, rubs, or gallops,no edema  Respiratory:  clear to auscultation bilaterally, normal work of breathing GI: soft, nontender, nondistended, + BS MS: no deformity or atrophy  Skin: warm and dry, device site well healed Neuro:  Strength and sensation are intact Psych: euthymic mood, full affect  EKG:  EKG is not ordered today. Personal review of the ekg ordered 06/30/17 shows AV paced  Personal review of the device interrogation today. Results in [place]: 03/09/2017: Magnesium 2.2 03/10/2017: ALT 13; Hemoglobin 11.2; Platelets 158 06/30/2017: BUN 8; Creatinine, Ser 0.98; Potassium 3.6; Sodium 137    Lipid Panel     Component Value Date/Time   CHOL 137 06/30/2017 1154   TRIG 104 06/30/2017 1154   HDL 40 (L) 06/30/2017 1154   CHOLHDL 3.4 06/30/2017 1154    VLDL 21 06/30/2017 1154   LDLCALC 76 06/30/2017 1154     Wt Readings from Last 3 Encounters:  01/11/18 161 lb 9.6 oz (73.3 kg)  07/18/17 170 lb (77.1 kg)  06/30/17 170 lb (77.1 kg)      Other studies Reviewed: Additional studies/ records that were reviewed today include: TTE 09/29/16  Review of the above records today demonstrates:  - Left ventricle: The cavity size was normal. Wall thickness was   increased in a pattern of mild LVH. Basal anterolateral severe   hypokinesis, basal inferolateral akinesis. Systolic function was   low normal to mildly reduced. The estimated ejection fraction was   in the range of 50% to 55%. Indeterminant diastolic function. - Aortic valve: There was no stenosis. There was mild   regurgitation. - Mitral valve: There was trivial regurgitation. - Left atrium: The atrium was mildly dilated. - Right ventricle: The cavity size was normal. Systolic function   was mildly reduced. - Tricuspid valve: No complete TR doppler jet so unable to estimate   PA systolic pressure. - Pulmonary arteries: PA peak pressure: 33 mm Hg (S). - Inferior vena cava: The vessel was normal in size. The   respirophasic diameter changes were in the normal range (>= 50%),   consistent with normal central venous pressure.  LHC 09/28/16  There is mild left ventricular systolic dysfunction.  LV end diastolic pressure is mildly elevated.  The left ventricular ejection fraction is 45-50% by visual estimate.  Ost RCA to Prox RCA lesion, 100 %stenosed.  LM lesion, 35 %stenosed.  Prox Cx to Mid Cx lesion, 30 %stenosed.  Ost 1st Mrg to 1st Mrg lesion, 100 %stenosed.  Ost 2nd Mrg to 2nd Mrg lesion, 100 %stenosed.  Prox LAD lesion, 100 %stenosed.  LIMA graft was visualized by angiography and is normal in caliber and anatomically normal.  SVG graft was visualized by angiography and is normal in caliber and anatomically normal.  SVG graft was not visualized due to known  occlusion.  Origin to Prox Graft lesion, 100 %stenosed.   1. Left dominant circulation 2. Severe 3 vessel occlusive CAD 3. Patent stent  in the proximal to mid LAD 4. Patent SVG to OM1 5. Patent LIMA to LAD 6. Known occlusion of SVG to OM2 7. Mild LV dysfunction EF 45-50%. 8. Mildly elevated LVEDP.  ASSESSMENT AND PLAN:  1.  Complete AV block: Saint Jude dual-chamber pacemaker implanted 09/29/2016.  Device functioning appropriately.  No changes.  2. Hypertension: Well-controlled.  No changes.  3. Coronary artery disease: Chest pain.  Continue current management.  4. Diastolic heart failure: No signs of volume overload.  No changes.  5. Paroxysmal atrial fibrillation: Currently on Coumadin.  Patient asymptomatic.  This patients CHA2DS2-VASc Score and unadjusted Ischemic Stroke Rate (% per year) is equal to 7.2 % stroke rate/year from a score of 5  Above score calculated as 1 point each if present [CHF, HTN, DM, Vascular=MI/PAD/Aortic Plaque, Age if 65-74, or Female] Above score calculated as 2 points each if present [Age > 75, or Stroke/TIA/TE]   Current medicines are reviewed at length with the patient today.   The patient does not have concerns regarding her medicines.  The following changes were made today: none  Labs/ tests ordered today include:  No orders of the defined types were placed in this encounter.    Disposition:   FU with Presley Summerlin 6 months  Signed, Valerio Pinard Meredith Leeds, MD  01/11/2018 10:33 AM     [facility] [address] [place]  [address] [phone] (office) [phone] (fax)

## 2018-01-11 NOTE — Patient Instructions (Signed)
Medication Instructions: Your physician recommends that you continue on your current medications as directed. Please refer to the Current Medication list given to you today.   Labwork: None ordered   Procedures/Testing: None ordered  Follow-Up: Remote monitoring is used to monitor your Pacemaker of ICD from home. This monitoring reduces the number of office visits required to check your device to one time per year. It allows [place] to keep an eye on the functioning of your device to ensure it is working properly. You are scheduled for a device check from home on 01/16/18. You may send your transmission at any time that day. If you have a wireless device, the transmission will be sent automatically. After your physician reviews your transmission, you will receive a postcard with your next transmission date.  .   Your physician wants you to follow-up in: 6 months with Dr. Cora Daniels will receive a reminder letter in the mail two months in advance. If you don't receive a letter, please call our office to schedule the follow-up appointment.   Any Additional Special Instructions Will Be Listed Below (If Applicable).     If you need a refill on your cardiac medications before your next appointment, please call your pharmacy.

## 2018-01-12 ENCOUNTER — Ambulatory Visit: Payer: Medicare Other | Admitting: Orthotics

## 2018-01-12 DIAGNOSIS — E1140 Type 2 diabetes mellitus with diabetic neuropathy, unspecified: Secondary | ICD-10-CM

## 2018-01-12 DIAGNOSIS — M2042 Other hammer toe(s) (acquired), left foot: Secondary | ICD-10-CM

## 2018-01-12 DIAGNOSIS — M2012 Hallux valgus (acquired), left foot: Secondary | ICD-10-CM

## 2018-01-12 DIAGNOSIS — M2041 Other hammer toe(s) (acquired), right foot: Secondary | ICD-10-CM

## 2018-01-12 NOTE — Progress Notes (Signed)
Patient fit and measured for DBS today; PCP is [facility], DMP is [facility] [phone].

## 2018-01-16 ENCOUNTER — Ambulatory Visit (INDEPENDENT_AMBULATORY_CARE_PROVIDER_SITE_OTHER): Payer: Medicare Other | Admitting: Pharmacist

## 2018-01-16 ENCOUNTER — Ambulatory Visit (INDEPENDENT_AMBULATORY_CARE_PROVIDER_SITE_OTHER): Payer: Medicare Other | Admitting: *Deleted

## 2018-01-16 DIAGNOSIS — I4891 Unspecified atrial fibrillation: Secondary | ICD-10-CM | POA: Diagnosis not present

## 2018-01-16 DIAGNOSIS — I442 Atrioventricular block, complete: Secondary | ICD-10-CM | POA: Diagnosis not present

## 2018-01-16 DIAGNOSIS — Z5181 Encounter for therapeutic drug level monitoring: Secondary | ICD-10-CM | POA: Diagnosis not present

## 2018-01-16 LAB — POCT INR: INR: 3.5

## 2018-01-16 NOTE — Progress Notes (Signed)
Remote pacemaker transmission.   

## 2018-01-16 NOTE — Patient Instructions (Signed)
Description   Skip your Coumadin today, then start taking 1 tablet daily.  Recheck in 2 weeks. Coumadin Clinic [phone] Main [phone]

## 2018-01-17 ENCOUNTER — Encounter: Payer: Self-pay | Admitting: Cardiology

## 2018-01-19 LAB — CUP PACEART REMOTE DEVICE CHECK
Date Time Interrogation Session: 20190425081453
Implantable Lead Implant Date: 20180103
Implantable Lead Location: 753859
Implantable Lead Location: 753860
MDC IDC LEAD IMPLANT DT: 20180103
MDC IDC PG IMPLANT DT: 20180103
MDC IDC PG SERIAL: 7988263

## 2018-01-30 ENCOUNTER — Other Ambulatory Visit: Payer: Self-pay | Admitting: Cardiology

## 2018-01-30 DIAGNOSIS — I6523 Occlusion and stenosis of bilateral carotid arteries: Secondary | ICD-10-CM

## 2018-02-06 ENCOUNTER — Ambulatory Visit (HOSPITAL_COMMUNITY)
Admission: RE | Admit: 2018-02-06 | Discharge: 2018-02-06 | Disposition: A | Discharge status: Home / Self Care | Payer: Medicare Other | Source: Ambulatory Visit | Attending: Cardiovascular Disease | Admitting: Cardiovascular Disease

## 2018-02-06 DIAGNOSIS — I6523 Occlusion and stenosis of bilateral carotid arteries: Secondary | ICD-10-CM

## 2018-02-06 DIAGNOSIS — E119 Type 2 diabetes mellitus without complications: Secondary | ICD-10-CM | POA: Diagnosis not present

## 2018-02-06 DIAGNOSIS — E785 Hyperlipidemia, unspecified: Secondary | ICD-10-CM | POA: Diagnosis not present

## 2018-02-06 DIAGNOSIS — I2510 Atherosclerotic heart disease of native coronary artery without angina pectoris: Secondary | ICD-10-CM | POA: Diagnosis not present

## 2018-02-06 DIAGNOSIS — I10 Essential (primary) hypertension: Secondary | ICD-10-CM | POA: Diagnosis not present

## 2018-02-07 ENCOUNTER — Ambulatory Visit: Payer: Medicare Other

## 2018-02-10 ENCOUNTER — Ambulatory Visit (INDEPENDENT_AMBULATORY_CARE_PROVIDER_SITE_OTHER): Payer: Medicare Other | Admitting: *Deleted

## 2018-02-10 DIAGNOSIS — I4891 Unspecified atrial fibrillation: Secondary | ICD-10-CM | POA: Diagnosis not present

## 2018-02-10 DIAGNOSIS — I480 Paroxysmal atrial fibrillation: Secondary | ICD-10-CM

## 2018-02-10 DIAGNOSIS — Z5181 Encounter for therapeutic drug level monitoring: Secondary | ICD-10-CM | POA: Diagnosis not present

## 2018-02-10 LAB — POCT INR: INR: 1.6

## 2018-02-10 NOTE — Patient Instructions (Signed)
Description   Today May 17th take 2 tablets then continue taking coumadin as you have been taking  1 tablet daily except 1 and 1/2 tablets on  Fridays   Recheck in 2 weeks. Coumadin Clinic [phone] Main [phone]

## 2018-02-14 ENCOUNTER — Ambulatory Visit (INDEPENDENT_AMBULATORY_CARE_PROVIDER_SITE_OTHER): Payer: Medicare Other | Admitting: Orthotics

## 2018-02-14 DIAGNOSIS — M2042 Other hammer toe(s) (acquired), left foot: Secondary | ICD-10-CM

## 2018-02-14 DIAGNOSIS — E1140 Type 2 diabetes mellitus with diabetic neuropathy, unspecified: Secondary | ICD-10-CM

## 2018-02-14 DIAGNOSIS — M2041 Other hammer toe(s) (acquired), right foot: Secondary | ICD-10-CM | POA: Diagnosis not present

## 2018-02-14 DIAGNOSIS — Q828 Other specified congenital malformations of skin: Secondary | ICD-10-CM

## 2018-02-14 DIAGNOSIS — M2012 Hallux valgus (acquired), left foot: Secondary | ICD-10-CM

## 2018-02-14 NOTE — Progress Notes (Signed)

## 2018-02-15 ENCOUNTER — Other Ambulatory Visit: Payer: Self-pay | Admitting: Cardiology

## 2018-02-24 ENCOUNTER — Ambulatory Visit (INDEPENDENT_AMBULATORY_CARE_PROVIDER_SITE_OTHER): Payer: Medicare Other | Admitting: *Deleted

## 2018-02-24 DIAGNOSIS — I4891 Unspecified atrial fibrillation: Secondary | ICD-10-CM

## 2018-02-24 DIAGNOSIS — Z5181 Encounter for therapeutic drug level monitoring: Secondary | ICD-10-CM | POA: Diagnosis not present

## 2018-02-24 LAB — POCT INR: INR: 2.1 (ref 2.0–3.0)

## 2018-02-24 NOTE — Patient Instructions (Signed)
Description   Continue taking coumadin   1 tablet daily except 1 and 1/2 tablets on  Fridays   Recheck in 3 weeks. Coumadin Clinic [phone] Main [phone]

## 2018-03-17 ENCOUNTER — Ambulatory Visit (INDEPENDENT_AMBULATORY_CARE_PROVIDER_SITE_OTHER): Payer: Medicare Other | Admitting: *Deleted

## 2018-03-17 DIAGNOSIS — Z5181 Encounter for therapeutic drug level monitoring: Secondary | ICD-10-CM | POA: Diagnosis not present

## 2018-03-17 DIAGNOSIS — I4891 Unspecified atrial fibrillation: Secondary | ICD-10-CM

## 2018-03-17 LAB — POCT INR: INR: 1.2 — AB (ref 2.0–3.0)

## 2018-03-17 NOTE — Patient Instructions (Signed)
Description   Today June 21st take 2 tablets (10mg ) then tomorrow June 22nd take 1 and 1/2 tablets (7.5mg ) then continue taking coumadin   1 tablet daily except 1 and 1/2 tablets on  Fridays   Recheck in 1 week. Coumadin Clinic [phone] Main [phone]

## 2018-03-18 ENCOUNTER — Encounter (HOSPITAL_COMMUNITY): Payer: Self-pay

## 2018-03-18 ENCOUNTER — Other Ambulatory Visit: Payer: Self-pay

## 2018-03-18 ENCOUNTER — Emergency Department (HOSPITAL_COMMUNITY): Payer: Medicare Other

## 2018-03-18 ENCOUNTER — Inpatient Hospital Stay (HOSPITAL_COMMUNITY)
Admission: EM | Admit: 2018-03-18 | Discharge: 2018-03-23 | DRG: 281 | Disposition: A | Discharge status: Home / Self Care | Payer: Medicare Other | Attending: Cardiology | Admitting: Cardiology

## 2018-03-18 DIAGNOSIS — R0902 Hypoxemia: Secondary | ICD-10-CM | POA: Diagnosis not present

## 2018-03-18 DIAGNOSIS — R072 Precordial pain: Secondary | ICD-10-CM | POA: Diagnosis not present

## 2018-03-18 DIAGNOSIS — I480 Paroxysmal atrial fibrillation: Secondary | ICD-10-CM | POA: Diagnosis present

## 2018-03-18 DIAGNOSIS — E7800 Pure hypercholesterolemia, unspecified: Secondary | ICD-10-CM | POA: Diagnosis present

## 2018-03-18 DIAGNOSIS — E039 Hypothyroidism, unspecified: Secondary | ICD-10-CM | POA: Diagnosis present

## 2018-03-18 DIAGNOSIS — G40909 Epilepsy, unspecified, not intractable, without status epilepticus: Secondary | ICD-10-CM | POA: Diagnosis present

## 2018-03-18 DIAGNOSIS — I2582 Chronic total occlusion of coronary artery: Secondary | ICD-10-CM | POA: Diagnosis present

## 2018-03-18 DIAGNOSIS — I5032 Chronic diastolic (congestive) heart failure: Secondary | ICD-10-CM | POA: Diagnosis present

## 2018-03-18 DIAGNOSIS — Z950 Presence of cardiac pacemaker: Secondary | ICD-10-CM | POA: Diagnosis not present

## 2018-03-18 DIAGNOSIS — I110 Hypertensive heart disease with heart failure: Secondary | ICD-10-CM | POA: Diagnosis present

## 2018-03-18 DIAGNOSIS — I340 Nonrheumatic mitral (valve) insufficiency: Secondary | ICD-10-CM | POA: Diagnosis not present

## 2018-03-18 DIAGNOSIS — Z888 Allergy status to other drugs, medicaments and biological substances status: Secondary | ICD-10-CM | POA: Diagnosis not present

## 2018-03-18 DIAGNOSIS — I214 Non-ST elevation (NSTEMI) myocardial infarction: Secondary | ICD-10-CM | POA: Diagnosis present

## 2018-03-18 DIAGNOSIS — Z955 Presence of coronary angioplasty implant and graft: Secondary | ICD-10-CM | POA: Diagnosis not present

## 2018-03-18 DIAGNOSIS — Z79890 Hormone replacement therapy (postmenopausal): Secondary | ICD-10-CM

## 2018-03-18 DIAGNOSIS — I351 Nonrheumatic aortic (valve) insufficiency: Secondary | ICD-10-CM | POA: Diagnosis not present

## 2018-03-18 DIAGNOSIS — I25119 Atherosclerotic heart disease of native coronary artery with unspecified angina pectoris: Secondary | ICD-10-CM | POA: Diagnosis present

## 2018-03-18 DIAGNOSIS — I4891 Unspecified atrial fibrillation: Secondary | ICD-10-CM | POA: Diagnosis present

## 2018-03-18 DIAGNOSIS — D573 Sickle-cell trait: Secondary | ICD-10-CM | POA: Diagnosis present

## 2018-03-18 DIAGNOSIS — Z7983 Long term (current) use of bisphosphonates: Secondary | ICD-10-CM | POA: Diagnosis not present

## 2018-03-18 DIAGNOSIS — R079 Chest pain, unspecified: Secondary | ICD-10-CM | POA: Diagnosis not present

## 2018-03-18 DIAGNOSIS — I509 Heart failure, unspecified: Secondary | ICD-10-CM

## 2018-03-18 DIAGNOSIS — Z7984 Long term (current) use of oral hypoglycemic drugs: Secondary | ICD-10-CM

## 2018-03-18 DIAGNOSIS — I442 Atrioventricular block, complete: Secondary | ICD-10-CM | POA: Diagnosis present

## 2018-03-18 DIAGNOSIS — I25810 Atherosclerosis of coronary artery bypass graft(s) without angina pectoris: Secondary | ICD-10-CM | POA: Diagnosis present

## 2018-03-18 DIAGNOSIS — R931 Abnormal findings on diagnostic imaging of heart and coronary circulation: Secondary | ICD-10-CM

## 2018-03-18 DIAGNOSIS — I2510 Atherosclerotic heart disease of native coronary artery without angina pectoris: Secondary | ICD-10-CM | POA: Diagnosis not present

## 2018-03-18 DIAGNOSIS — E119 Type 2 diabetes mellitus without complications: Secondary | ICD-10-CM | POA: Diagnosis present

## 2018-03-18 DIAGNOSIS — I10 Essential (primary) hypertension: Secondary | ICD-10-CM | POA: Diagnosis present

## 2018-03-18 DIAGNOSIS — R0789 Other chest pain: Secondary | ICD-10-CM | POA: Diagnosis not present

## 2018-03-18 DIAGNOSIS — R61 Generalized hyperhidrosis: Secondary | ICD-10-CM | POA: Diagnosis not present

## 2018-03-18 DIAGNOSIS — Z7901 Long term (current) use of anticoagulants: Secondary | ICD-10-CM

## 2018-03-18 DIAGNOSIS — R52 Pain, unspecified: Secondary | ICD-10-CM | POA: Diagnosis not present

## 2018-03-18 DIAGNOSIS — Z885 Allergy status to narcotic agent status: Secondary | ICD-10-CM

## 2018-03-18 DIAGNOSIS — Z79899 Other long term (current) drug therapy: Secondary | ICD-10-CM | POA: Diagnosis not present

## 2018-03-18 DIAGNOSIS — Z833 Family history of diabetes mellitus: Secondary | ICD-10-CM

## 2018-03-18 LAB — I-STAT TROPONIN, ED: TROPONIN I, POC: 0.09 ng/mL — AB (ref 0.00–0.08)

## 2018-03-18 LAB — GLUCOSE, CAPILLARY
Glucose-Capillary: 48 mg/dL — ABNORMAL LOW (ref 65–99)
Glucose-Capillary: 114 mg/dL — ABNORMAL HIGH (ref 65–99)

## 2018-03-18 LAB — CBC
HEMATOCRIT: 36.2 % (ref 36.0–46.0)
HEMOGLOBIN: 11.9 g/dL — AB (ref 12.0–15.0)
MCH: 32.9 pg (ref 26.0–34.0)
MCHC: 32.9 g/dL (ref 30.0–36.0)
MCV: 100 fL (ref 78.0–100.0)
Platelets: 169 10*3/uL (ref 150–400)
RBC: 3.62 MIL/uL — AB (ref 3.87–5.11)
RDW: 11.9 % (ref 11.5–15.5)
WBC: 5.1 10*3/uL (ref 4.0–10.5)

## 2018-03-18 LAB — BRAIN NATRIURETIC PEPTIDE: B NATRIURETIC PEPTIDE 5: 232.5 pg/mL — AB (ref 0.0–100.0)

## 2018-03-18 LAB — BASIC METABOLIC PANEL
ANION GAP: 9 (ref 5–15)
BUN: 10 mg/dL (ref 6–20)
CALCIUM: 8 mg/dL — AB (ref 8.9–10.3)
CO2: 25 mmol/L (ref 22–32)
Chloride: 106 mmol/L (ref 101–111)
Creatinine, Ser: 0.83 mg/dL (ref 0.44–1.00)
Glucose, Bld: 86 mg/dL (ref 65–99)
POTASSIUM: 3.7 mmol/L (ref 3.5–5.1)
SODIUM: 140 mmol/L (ref 135–145)

## 2018-03-18 LAB — HEMOGLOBIN A1C
Hgb A1c MFr Bld: 5.6 % (ref 4.8–5.6)
MEAN PLASMA GLUCOSE: 114.02 mg/dL

## 2018-03-18 LAB — TROPONIN I

## 2018-03-18 MED ORDER — FERROUS SULFATE 325 (65 FE) MG PO TABS
325.0000 mg | ORAL_TABLET | Freq: Two times a day (BID) | ORAL | Status: DC
  Administered 2018-03-19 – 2018-03-23 (×6): 325 mg via ORAL
  Filled 2018-03-18 (×7): qty 1

## 2018-03-18 MED ORDER — PHENYTOIN SODIUM EXTENDED 100 MG PO CAPS
200.0000 mg | ORAL_CAPSULE | Freq: Every day | ORAL | Status: DC
  Administered 2018-03-19 – 2018-03-23 (×5): 200 mg via ORAL
  Filled 2018-03-18 (×5): qty 2

## 2018-03-18 MED ORDER — ISOSORBIDE MONONITRATE ER 60 MG PO TB24
60.0000 mg | ORAL_TABLET | Freq: Two times a day (BID) | ORAL | Status: DC
  Administered 2018-03-18 – 2018-03-23 (×10): 60 mg via ORAL
  Filled 2018-03-18 (×10): qty 1

## 2018-03-18 MED ORDER — PHENYTOIN SODIUM EXTENDED 100 MG PO CAPS
100.0000 mg | ORAL_CAPSULE | Freq: Every day | ORAL | Status: DC
  Administered 2018-03-18 – 2018-03-22 (×5): 100 mg via ORAL
  Filled 2018-03-18 (×5): qty 1

## 2018-03-18 MED ORDER — TIMOLOL MALEATE 0.5 % OP SOLN
1.0000 [drp] | Freq: Every day | OPHTHALMIC | Status: DC
  Administered 2018-03-19 – 2018-03-23 (×5): 1 [drp] via OPHTHALMIC
  Filled 2018-03-18: qty 5

## 2018-03-18 MED ORDER — ONDANSETRON HCL 4 MG/2ML IJ SOLN: 4.0000 mg | Freq: Four times a day (QID) | INTRAMUSCULAR | Status: DC | PRN

## 2018-03-18 MED ORDER — HEPARIN BOLUS VIA INFUSION
4000.0000 [IU] | Freq: Once | INTRAVENOUS | Status: AC
  Administered 2018-03-18: 4000 [IU] via INTRAVENOUS
  Filled 2018-03-18: qty 4000

## 2018-03-18 MED ORDER — PANTOPRAZOLE SODIUM 40 MG PO TBEC
40.0000 mg | DELAYED_RELEASE_TABLET | Freq: Every day | ORAL | Status: DC
  Administered 2018-03-19 – 2018-03-23 (×5): 40 mg via ORAL
  Filled 2018-03-18 (×5): qty 1

## 2018-03-18 MED ORDER — HEPARIN (PORCINE) IN NACL 100-0.45 UNIT/ML-% IJ SOLN
1000.0000 [IU]/h | INTRAMUSCULAR | Status: DC
  Administered 2018-03-18: 1000 [IU]/h via INTRAVENOUS
  Filled 2018-03-18: qty 250

## 2018-03-18 MED ORDER — LEVOTHYROXINE SODIUM 100 MCG PO TABS
100.0000 ug | ORAL_TABLET | Freq: Every day | ORAL | Status: DC
  Administered 2018-03-19 – 2018-03-23 (×5): 100 ug via ORAL
  Filled 2018-03-18 (×5): qty 1

## 2018-03-18 MED ORDER — CYCLOSPORINE 0.05 % OP EMUL
1.0000 [drp] | Freq: Two times a day (BID) | OPHTHALMIC | Status: DC
  Administered 2018-03-18 – 2018-03-23 (×10): 1 [drp] via OPHTHALMIC
  Filled 2018-03-18 (×11): qty 1

## 2018-03-18 MED ORDER — CARVEDILOL 3.125 MG PO TABS
3.1250 mg | ORAL_TABLET | Freq: Two times a day (BID) | ORAL | Status: DC
  Administered 2018-03-18 – 2018-03-19 (×2): 3.125 mg via ORAL
  Filled 2018-03-18 (×2): qty 1

## 2018-03-18 MED ORDER — NITROGLYCERIN 0.4 MG SL SUBL: 0.4000 mg | SUBLINGUAL_TABLET | SUBLINGUAL | Status: DC | PRN

## 2018-03-18 MED ORDER — ASPIRIN EC 81 MG PO TBEC
81.0000 mg | DELAYED_RELEASE_TABLET | Freq: Every day | ORAL | Status: DC
  Administered 2018-03-19 – 2018-03-23 (×5): 81 mg via ORAL
  Filled 2018-03-18 (×5): qty 1

## 2018-03-18 MED ORDER — INSULIN ASPART 100 UNIT/ML ~~LOC~~ SOLN: 0.0000 [IU] | Freq: Three times a day (TID) | SUBCUTANEOUS | Status: DC

## 2018-03-18 MED ORDER — ACETAMINOPHEN 325 MG PO TABS
650.0000 mg | ORAL_TABLET | ORAL | Status: DC | PRN
  Administered 2018-03-18 – 2018-03-23 (×4): 650 mg via ORAL
  Filled 2018-03-18 (×4): qty 2

## 2018-03-18 MED ORDER — EZETIMIBE 10 MG PO TABS
10.0000 mg | ORAL_TABLET | Freq: Every day | ORAL | Status: DC
  Administered 2018-03-19 – 2018-03-23 (×5): 10 mg via ORAL
  Filled 2018-03-18 (×5): qty 1

## 2018-03-18 MED ORDER — FUROSEMIDE 40 MG PO TABS
60.0000 mg | ORAL_TABLET | Freq: Two times a day (BID) | ORAL | Status: DC
  Administered 2018-03-19 – 2018-03-22 (×7): 60 mg via ORAL
  Filled 2018-03-18 (×7): qty 1

## 2018-03-18 MED ORDER — ROSUVASTATIN CALCIUM 20 MG PO TABS
40.0000 mg | ORAL_TABLET | Freq: Every day | ORAL | Status: DC
  Administered 2018-03-19 – 2018-03-23 (×5): 40 mg via ORAL
  Filled 2018-03-18 (×7): qty 2

## 2018-03-18 NOTE — ED Notes (Signed)
Patient transported to X-ray 

## 2018-03-18 NOTE — ED Notes (Signed)
Attempted to call report x 1  

## 2018-03-18 NOTE — ED Triage Notes (Signed)
Pt brought in by [facility] from home for left sided chest pressure x15 minutes. Pt states she took her home nitro, pain went from 7/10 to 0/10 during transport. Pt states pain does not radiate, denies SOB, NV, dizziness. Per EMS pt has pacemaker, placed in January of 2018. Pt A+Ox4 and in NAD on arrival.

## 2018-03-18 NOTE — Progress Notes (Signed)
ANTICOAGULATION CONSULT NOTE - Initial Consult  Pharmacy Consult for Heparin Indication: chest pain/ACS  Allergies  Allergen Reactions  . Meperidine Hcl Other (See Comments)    Demerol - mental status changes    . Nitroglycerin Other (See Comments)    Patch and Cream only - blisters  . Morphine Palpitations    Patient Measurements: Height: 5' (152.4 cm) Weight: 158 lb (71.7 kg) IBW/kg (Calculated) : 45.5 Heparin Dosing Weight: 62  Vital Signs: Temp: 98.1 F (36.7 C) (06/22 1503) Temp Source: Oral (06/22 1503) BP: 113/61 (06/22 1730) Pulse Rate: 63 (06/22 1730)  Labs: Recent Labs    03/17/18 1014 03/18/18 1525  HGB  --  11.9*  HCT  --  36.2  PLT  --  169  INR 1.2*  --   CREATININE  --  0.83    Estimated Creatinine Clearance: 50.2 mL/min (by C-G formula based on SCr of 0.83 mg/dL).   Medical History: Past Medical History:  Diagnosis Date  . Anemia    a. Noted on labs 04/2013.  . Anginal pain ([place])   . Arthritis    "legs" (06/10/2014)  . CAD (coronary artery disease)    a. s/p CABG 1992, b. s/p PCI several years ago; c. LHC 11/08: L-LAD ok, S-OM1 ok, S-OM2 CTO, prox to mid CFX 70% (unchanged), RCA non-dominant, occluded;  d.  Echo 2/11: EF 55%, mod LVH, Ao sclerosis;  e. Echo 6/14: mild LVH, mild focal basal sept hypertrophy, EF 55-65%, inf HK, Mild AI, mild MR, mild BAE;  f. Myoview 7/14: int risk-> cath 04/2013 s/p PTCA/DES to prox Cx 05/04/13.  . Chronic diastolic CHF (congestive heart failure) ([place])   . GERD (gastroesophageal reflux disease)   . Glaucoma   . High cholesterol   . Hypertension   . Hypokalemia 02/2017  . Hypothyroidism   . LBBB (left bundle branch block)    chronic  . Myocardial infarction ([place]) 03/1991 X 2; 07/1991  . Obesity   . Seizures ([place])    "I'm on Dilantin" (06/10/2014)  . Sickle cell trait ([place])   . Type II diabetes mellitus ([facility])     Assessment: 77 year old female on warfarin prior to admission for Afib, INR subtherapeutic, to  start heparin for chest pain   Goal of Therapy:  Heparin level 0.3-0.7 units/ml Monitor platelets by anticoagulation protocol: Yes   Plan:  Heparin 4000 units iv bolus x 1 Heparin drip at 1000 units / hr Heparin level and CBC 8 hours after heparin starts Daily heparin level, CBC  Thank you Anette Guarneri, PharmD [phone]  Tad Moore 03/18/2018,5:57 PM

## 2018-03-18 NOTE — ED Notes (Signed)
Admitting provider at bedside.

## 2018-03-18 NOTE — H&P (Addendum)
Cardiology Admission History and Physical:   Patient ID: Christine Gay; MRN: [id number]; DOB: 1941-01-30   Admission date: 03/18/2018  Primary Care Provider: Seward Carol, MD Primary Cardiologist: Aundra Dubin Primary Electrophysiologist:  Curt Bears  Chief Complaint:  Chest pain   Patient Profile:   Christine Gay is a 77 y.o. female with a history of CAD with prior CABG (1992) with subsequent PCI, CHB s/p PPM, HFpEF, AF, HTN, HLD, who presents with NSTEMI.   History of Present Illness:   Christine Gay is a 77 y.o. female with a history of CAD with prior CABG (1992) with subsequent PCI, CHB s/p PPM, HFpEF, AF, HTN, HLD, who presents with NSTEMI.   The patient follows with Dr. Aundra Dubin for management of her multiple cardiac comorbidities. She was seen in clinic in 06/2017 and was doing well without chest pain. Her last LHC in 09/2016 showed patent LIMA-LAD and SVG-OM1, patent LCx stent, and occluded SVG-OM2 (known from past), no evidence for plaque rupture/new disease. Her last echo showed EF stable at 50-55%. She follows with Dr. Curt Bears for her device management.   She presents to the ED today with chest pain. She reports that she was at a funeral when she developed sudden onset chest pressure with radiation to the L shoulder. She had no associated symptoms including dyspnea or diaphoresis or nausea. Her pain resolved with one NTG. She reported this pain to be similar to her prior MI. She reports that she has not had chest pain like this in years. She states that her LE edema has been stable and that she has experienced no dyspnea.   In the ED, ECG showed paced rhythm, similar to prior tracing. Initial troponin was positive at 0.09. INR 1.2. She was started on a heparin gtt, and cardiology was consulted for admission.    Past Medical History:  Diagnosis Date  . Anemia    a. Noted on labs 04/2013.  . Anginal pain ([place])   . Arthritis    "legs" (06/10/2014)  . CAD (coronary artery  disease)    a. s/p CABG 1992, b. s/p PCI several years ago; c. LHC 11/08: L-LAD ok, S-OM1 ok, S-OM2 CTO, prox to mid CFX 70% (unchanged), RCA non-dominant, occluded;  d.  Echo 2/11: EF 55%, mod LVH, Ao sclerosis;  e. Echo 6/14: mild LVH, mild focal basal sept hypertrophy, EF 55-65%, inf HK, Mild AI, mild MR, mild BAE;  f. Myoview 7/14: int risk-> cath 04/2013 s/p PTCA/DES to prox Cx 05/04/13.  . Chronic diastolic CHF (congestive heart failure) ([place])   . GERD (gastroesophageal reflux disease)   . Glaucoma   . High cholesterol   . Hypertension   . Hypokalemia 02/2017  . Hypothyroidism   . LBBB (left bundle branch block)    chronic  . Myocardial infarction ([place]) 03/1991 X 2; 07/1991  . Obesity   . Seizures ([place])    "I'm on Dilantin" (06/10/2014)  . Sickle cell trait ([place])   . Type II diabetes mellitus ([place])     Past Surgical History:  Procedure Laterality Date  . CARDIAC CATHETERIZATION     "several;  they went up to check"  . CARDIAC CATHETERIZATION N/A 09/28/2016   Procedure: Left Heart Cath and Coronary Angiography;  Surgeon: Peter M Martinique, MD;  Location: [facility];  Service: Cardiovascular;  Laterality: N/A;  . CATARACT EXTRACTION W/ INTRAOCULAR LENS  IMPLANT, BILATERAL Bilateral   . CHOLECYSTECTOMY    . COLONOSCOPY N/A 06/12/2014  Procedure: COLONOSCOPY;  Surgeon: Cleotis Nipper, MD;  Location: [facility] ENDOSCOPY;  Service: Endoscopy;  Laterality: N/A;  . CORONARY ANGIOPLASTY WITH STENT PLACEMENT  ? date; 05/04/2013   ? location; DES  to [place]  . CORONARY ARTERY BYPASS GRAFT  07/1991   "CABG X 3"  . EP IMPLANTABLE DEVICE N/A 09/29/2016   Procedure: Pacemaker Implant;  Surgeon: Will Meredith Leeds, MD;  Location: [facility];  Service: Cardiovascular;  Laterality: N/A;  . ESOPHAGOGASTRODUODENOSCOPY N/A 06/12/2014   Procedure: ESOPHAGOGASTRODUODENOSCOPY (EGD);  Surgeon: Cleotis Nipper, MD;  Location[facility] END[facility]COPY;  Service: Endoscopy;  Laterality: N/A;  . LEFT AND RIGHT  HEART CATHETERIZATION WITH CORONARY ANGIOGRAM N/A 05/04/2013   Procedure: LEFT AND RIGHT HEART CATHETERIZATION WITH CORONARY ANGIOGRAM;  Surgeon: Larey Dresser, MD;  Location: [facility] CATH LAB;  Service: Cardiovascular;  Laterality: N/A;  . PERCUTANEOUS STENT INTERVENTION  05/04/2013   Procedure: PERCUTANEOUS STENT INTERVENTION;  Surgeon: Larey Dresser, MD;  Location: [facility] CATH LAB;  Service: Cardiovascular;;  . TUBAL LIGATION Bilateral 1969     Medications Prior to Admission: Prior to Admission medications   Medication Sig Start Date End Date Taking? Authorizing Provider  acetaminophen (TYLENOL) 325 MG tablet Take 1-2 tablets (325-650 mg total) by mouth every 4 (four) hours as needed for mild pain. 09/30/16   Shirley Friar, PA-C  carvedilol (COREG) 6.25 MG tablet Take 0.5 tablets (3.125 mg total) by mouth 2 (two) times daily. 04/15/15   Larey Dresser, MD  cycloSPORINE (RESTASIS) 0.05 % ophthalmic emulsion Place 1 drop into both eyes 2 (two) times daily.    [provider]  ezetimibe (ZETIA) 10 MG tablet TAKE 1 TABLET BY MOUTH DAILY. 01/04/18   Larey Dresser, MD  ferrous sulfate 325 (65 FE) MG tablet Take 1 tablet (325 mg total) by mouth 2 (two) times daily with a meal. 06/12/14   Ghimire, Henreitta Leber, MD  furosemide (LASIX) 40 MG tablet Take 1.5 tablets (60 mg total) by mouth 2 (two) times daily. 08/23/17   Bensimhon, Shaune Pascal, MD  glucose blood test strip Test blood glucose three times daily. 06/30/17   Larey Dresser, MD  ibandronate (BONIVA) 150 MG tablet Take 150 mg by mouth every 30 (thirty) days. Take in the morning with a full glass of water, on an empty stomach, and do not take anything else by mouth or lie down for the next 30 min.    [provider]  isosorbide mononitrate (IMDUR) 60 MG 24 hr tablet TAKE 1 TABLET BY MOUTH 2 TIMES DAILY. 02/16/18   Larey Dresser, MD  levothyroxine (SYNTHROID, LEVOTHROID) 100 MCG tablet Take 100 mcg by mouth daily before breakfast.     [provider]  metFORMIN (GLUCOPHAGE-XR) 500 MG 24 hr tablet Take 500 mg by mouth 2 (two) times daily.  05/08/13   Dunn, Dayna N, PA-C  NITROSTAT 0.4 MG SL tablet PLACE 1 TABLET (0.4 MG TOTAL) UNDER THE TONGUE EVERY 5 (FIVE) MINUTES AS NEEDED FOR CHEST PAIN. 05/07/15   Larey Dresser, MD  pantoprazole (PROTONIX) 40 MG tablet Take 1 tablet (40 mg total) by mouth daily. 11/30/17   Larey Dresser, MD  phenytoin (DILANTIN) 100 MG ER capsule Take 100-200 mg by mouth 2 (two) times daily. 200 mg in the morning and 100 mg in the evening    [provider]  potassium chloride SA (K-DUR,KLOR-CON) 20 MEQ tablet TAKE 1 TABLET BY MOUTH 2 TIMES DAILY. 12/19/17  Larey Dresser, MD  rosuvastatin (CRESTOR) 40 MG tablet TAKE 1 TABLET BY MOUTH DAILY 09/22/17   Bensimhon, Shaune Pascal, MD  timolol (TIMOPTIC) 0.5 % ophthalmic solution Place 1 drop into both eyes daily.  09/21/16   [provider]  warfarin (COUMADIN) 5 MG tablet TAKE AS DIRECTED BY COUMADIN CLINIC 06/06/17   Camnitz, Ocie Doyne, MD     Allergies:    Allergies  Allergen Reactions  . Meperidine Hcl Other (See Comments)    Demerol - mental status changes    . Nitroglycerin Other (See Comments)    Patch and Cream only - blisters  . Morphine Palpitations    Social History:   Social History   Socioeconomic History  . Marital status: Married    Spouse name: Not on file  . Number of children: Not on file  . Years of education: Not on file  . Highest education level: Not on file  Occupational History  . Not on file  Social Needs  . Financial resource strain: Not on file  . Food insecurity:    Worry: Not on file    Inability: Not on file  . Transportation needs:    Medical: Not on file    Non-medical: Not on file  Tobacco Use  . Smoking status: Never Smoker  . Smokeless tobacco: Never Used  Substance and Sexual Activity  . Alcohol use: No  . Drug use: No  . Sexual activity: Never  Lifestyle  . Physical  activity:    Days per week: Not on file    Minutes per session: Not on file  . Stress: Not on file  Relationships  . Social connections:    Talks on phone: Not on file    Gets together: Not on file    Attends religious service: Not on file    Active member of club or organization: Not on file    Attends meetings of clubs or organizations: Not on file    Relationship status: Not on file  . Intimate partner violence:    Fear of current or ex partner: Not on file    Emotionally abused: Not on file    Physically abused: Not on file    Forced sexual activity: Not on file  Other Topics Concern  . Not on file  Social History Narrative  . Not on file    Family History:    The patient's family history includes Diabetes in her mother.    ROS:  Please see the history of present illness.  All other ROS reviewed and negative.     Physical Exam/Data:   Vitals:   03/18/18 1615 03/18/18 1730 03/18/18 1900 03/18/18 1915  BP: (!) 110/57 113/61 123/68 118/72  Pulse: (!) 57 63 65 68  Resp: (!) 22 17 18  (!) 21  Temp:      TempSrc:      SpO2: 96% 100% 99% 100%  Weight:      Height:       No intake or output data in the 24 hours ending 03/18/18 1933 Filed Weights   03/18/18 1504  Weight: 71.7 kg (158 lb)   Body mass index is 30.86 kg/m.  General:  Well nourished, well developed, in no acute distress  HEENT: normal Lymph: no adenopathy Neck: JVD at mid-neck at 60 degrees Cardiac:  normal S1, S2; RRR; II/VI systolic murmur Lungs:  clear to auscultation bilaterally, no wheezing, rhonchi or rales  Abd: soft, nontender, no hepatomegaly  Ext: trace  to 1+ LE edema Musculoskeletal:  No deformities, BUE and BLE strength normal and equal Skin: warm and dry  Neuro: No focal abnormalities noted Psych:  Normal affect    EKG:  The ECG that was done and was personally reviewed and demonstrates paced rhythm with TWI in V2 but otherwise largely similar to prior tracing.  Relevant CV  Studies: TTE 09/2016: - Left ventricle: The cavity size was normal. Wall thickness was   increased in a pattern of mild LVH. Basal anterolateral severe   hypokinesis, basal inferolateral akinesis. Systolic function was   low normal to mildly reduced. The estimated ejection fraction was   in the range of 50% to 55%. Indeterminant diastolic function. - Aortic valve: There was no stenosis. There was mild   regurgitation. - Mitral valve: There was trivial regurgitation. - Left atrium: The atrium was mildly dilated. - Right ventricle: The cavity size was normal. Systolic function   was mildly reduced. - Tricuspid valve: No complete TR doppler jet so unable to estimate   PA systolic pressure. - Pulmonary arteries: PA peak pressure: 33 mm Hg (S). - Inferior vena cava: The vessel was normal in size. The   respirophasic diameter changes were in the normal range (>= 50%),   consistent with normal central venous pressure. Impressions: - Normal LV size with mild LV hypertrophy. EF 50-55%. Wall motion   abnormalities as noted above. Normal RV size with mildly   decreased systolic function. Mild aortic insufficiency.  LHC 09/2016:  There is mild left ventricular systolic dysfunction.  LV end diastolic pressure is mildly elevated.  The left ventricular ejection fraction is 45-50% by visual estimate.  Ost RCA to Prox RCA lesion, 100 %stenosed.  LM lesion, 35 %stenosed.  Prox Cx to Mid Cx lesion, 30 %stenosed.  Ost 1st Mrg to 1st Mrg lesion, 100 %stenosed.  Ost 2nd Mrg to 2nd Mrg lesion, 100 %stenosed.  Prox LAD lesion, 100 %stenosed.  LIMA graft was visualized by angiography and is normal in caliber and anatomically normal.  SVG graft was visualized by angiography and is normal in caliber and anatomically normal.  SVG graft was not visualized due to known occlusion.  Origin to Prox Graft lesion, 100 %stenosed. 1. Left dominant circulation 2. Severe 3 vessel occlusive CAD 3. Patent  stent in the proximal to mid LAD 4. Patent SVG to OM1 5. Patent LIMA to LAD 6. Known occlusion of SVG to OM2 7. Mild LV dysfunction EF 45-50%. 8. Mildly elevated LVEDP Plan: No new blockage to explain her complete heart block. Continue medical therapy. Plans for permanent pacemaker if HR does not improve.    Laboratory Data:  Chemistry Recent Labs  Lab 03/18/18 1525  NA 140  K 3.7  CL 106  CO2 25  GLUCOSE 86  BUN 10  CREATININE 0.83  CALCIUM 8.0*  GFRNONAA >60  GFRAA >60  ANIONGAP 9    No results for input(s): PROT, ALBUMIN, AST, ALT, ALKPHOS, BILITOT in the last 168 hours. Hematology Recent Labs  Lab 03/18/18 1525  WBC 5.1  RBC 3.62*  HGB 11.9*  HCT 36.2  MCV 100.0  MCH 32.9  MCHC 32.9  RDW 11.9  PLT 169   Cardiac EnzymesNo results for input(s): TROPONINI in the last 168 hours.  Recent Labs  Lab 03/18/18 1531  TROPIPOC 0.09*    BNPNo results for input(s): BNP, PROBNP in the last 168 hours.  DDimer No results for input(s): DDIMER in the last 168 hours.  Radiology/Studies:  Dg Chest 2 View  Result Date: 03/18/2018 CLINICAL DATA:  Chest pain. EXAM: CHEST - 2 VIEW COMPARISON:  Radiographs of March 07, 2017. FINDINGS: Stable cardiomegaly. Sternotomy wires are noted. Left-sided pacemaker is unchanged in position. No pneumothorax or pleural effusion is noted. No acute pulmonary disease is noted. Bony thorax is unremarkable. IMPRESSION: No active cardiopulmonary disease. Electronically Signed   By: Marijo Conception, M.D.   On: 03/18/2018 16:22    Assessment and Plan:   NSTEMI CAD s/p CABG and PCI to LCx (04/2013) The patient has known CAD with stable coronary and graft anatomy in 09/2016. She presents with recurrence of chest pain at rest that is relieved with NTG. ECG does not have significant changes from prior study but initial POC troponin is positive. Her presentation is therefore concerning for NSTEMI. She remains chest pain free but would benefit from repeat  ischemic evaluation. -Continue to trend troponin  -Heparin gtt started in the ED -ASA 81 mg daily -Continue rosuvastatin, zetia  -Continue carvedilol, isosorbide -Repeat TTE ordered -Lipid panel, HgA1c ordered  -NPO at midnight  HFpEF Last echocardiogram shows stable EF. She has no HF symptoms but has slight hypervolemia on exam. -BNP ordered -Repeat echocardiogram ordered given change in clinical status -Continue home furosemide  HTN -Continue home regimen  DM2 -Holding home metformin -SSI while hospitalized   HLD -Lipid panel as above -Continue rosuvastatin, zetia   pAF -Currently in NSR -Continue carvedilol -Holding warfarin while transitioning to heparin gtt  CHB s/p PPM  -Outpt follow up for PPM   Hypothyroidism -Continue levothyroxine  Seizure disorder -Continue home AED   Severity of Illness: The appropriate patient status for this patient is INPATIENT. Inpatient status is judged to be reasonable and necessary in order to provide the required intensity of service to ensure the patient's safety. The patient's presenting symptoms, physical exam findings, and initial radiographic and laboratory data in the context of their chronic comorbidities is felt to place them at high risk for further clinical deterioration. Furthermore, it is not anticipated that the patient will be medically stable for discharge from the hospital within 2 midnights of admission. The following factors support the patient status of inpatient.   " The patient's presenting symptoms include chest pain. " The worrisome physical exam findings include hypervolemia. " The initial radiographic and laboratory data are worrisome because of elevated troponin. " The chronic co-morbidities include CAD.   * I certify that at the point of admission it is my clinical judgment that the patient will require inpatient hospital care spanning beyond 2 midnights from the point of admission due to high intensity  of service, high risk for further deterioration and high frequency of surveillance required.*    For questions or updates, please contact [place] Please consult [email or web address] for contact info under Cardiology/STEMI.    Signed, Nila Nephew, MD  03/18/2018 7:33 PM

## 2018-03-18 NOTE — ED Provider Notes (Signed)
[facility] EMERGENCY DEPARTMENT Provider Note   CSN: [id number] Arrival date & time: 03/18/18  1451     History   Chief Complaint Chief Complaint  Patient presents with  . Chest Pain    HPI Christine Gay is a 77 y.o. female with history of anemia, arthritis, CAD status post CABG, chronic diastolic CHF, GERD, HTN, HLD, left bundle branch block status post pacemaker 09/2016, and type 2 diabetes mellitus presents for evaluation of acute onset, resolved episode of chest pain.  She states that approximately 1 hour ago she was in the middle of the funeral service sitting in a chair when she developed sudden onset substernal chest pressure radiating to the left side of the chest and left shoulder.  She denies any associated shortness of breath, nausea, vomiting, lightheadedness, or diaphoresis.  Within 10 minutes she took 1 tablet of sublingual nitroglycerin which dropped her pain from 7/10 in severity to 0/10 within minutes.  When she was experiencing chest pressure, she denied any aggravating factors.  She denies any complaints at this time.  She states "this is how I felt when I had a heart attack 3 years ago ".  Denies fevers, chills, recent travel or surgeries, hemoptysis, or prior history of DVT or PE.  She is compliant with her Coumadin.  Her cardiologist is Dr. Marigene Ehlers.  The history is provided by the patient.    Past Medical History:  Diagnosis Date  . Anemia    a. Noted on labs 04/2013.  . Anginal pain ([place])   . Arthritis    "legs" (06/10/2014)  . CAD (coronary artery disease)    a. s/p CABG 1992, b. s/p PCI several years ago; c. LHC 11/08: L-LAD ok, S-OM1 ok, S-OM2 CTO, prox to mid CFX 70% (unchanged), RCA non-dominant, occluded;  d.  Echo 2/11: EF 55%, mod LVH, Ao sclerosis;  e. Echo 6/14: mild LVH, mild focal basal sept hypertrophy, EF 55-65%, inf HK, Mild AI, mild MR, mild BAE;  f. Myoview 7/14: int risk-> cath 04/2013 s/p PTCA/DES to prox Cx 05/04/13.  . Chronic  diastolic CHF (congestive heart failure) ([place])   . GERD (gastroesophageal reflux disease)   . Glaucoma   . High cholesterol   . Hypertension   . Hypokalemia 02/2017  . Hypothyroidism   . LBBB (left bundle branch block)    chronic  . Myocardial infarction ([place]) 03/1991 X 2; 07/1991  . Obesity   . Seizures ([place])    "I'm on Dilantin" (06/10/2014)  . Sickle cell trait ([place])   . Type II diabetes mellitus [facility])     Patient Active Problem List   Diagnosis Date Noted  . NSTEMI (non-ST elevated myocardial infarction) ([place]) 03/18/2018  . Shoulder pain, left 03/09/2017  . Seizure ([place])   . Chest pain 03/08/2017  . Muscle spasm 03/08/2017  . Hypocalcemia 03/08/2017  . Encounter for therapeutic drug monitoring 02/08/2017  . Atrial fibrillation ([place]) 02/08/2017  . Complete heart block ([place]) 09/28/2016  . Third degree heart block ([place])   . Unstable angina ([place])   . CAD (coronary artery disease) 11/11/2014  . Dizziness 07/29/2014  . GI bleed 06/12/2014  . GERD (gastroesophageal reflux disease) 06/11/2014  . Type II or unspecified type diabetes mellitus without mention of complication, not stated as uncontrolled 06/11/2014  . Glaucoma 06/11/2014  . Microcytic hypochromic anemia 06/10/2014  . Chronic diastolic CHF (congestive heart failure) ([place]) 08/09/2013  . Hypotension 05/18/2013  . Exertional dyspnea 03/15/2013  . CHF (  congestive heart failure) ([place]) 03/15/2013  . MURMUR 03/02/2010  . Hypothyroidism 03/20/2009  . HYPERCHOLESTEROLEMIA  IIA 03/20/2009  . HYPOKALEMIA 03/20/2009  . HYPERTENSION, BENIGN 03/20/2009  . CAD, ARTERY BYPASS GRAFT 03/20/2009    Past Surgical History:  Procedure Laterality Date  . CARDIAC CATHETERIZATION     "several;  they went up to check"  . CARDIAC CATHETERIZATION N/A 09/28/2016   Procedure: Left Heart Cath and Coronary Angiography;  Surgeon: Peter M Martinique, MD;  Location: [facility] CV LAB;  Service: Cardiovascular;  Laterality: N/A;  . CATARACT  EXTRACTION W/ INTRAOCULAR LENS  IMPLANT, BILATERAL Bilateral   . CHOLECYSTECTOMY    . COLONOSCOPY N/A 06/12/2014   Procedure: COLONOSCOPY;  Surgeon: Cleotis Nipper, MD;  Location: [facility] ENDOSCOPY;  Service: Endoscopy;  Laterality: N/A;  . CORONARY ANGIOPLASTY WITH STENT PLACEMENT  ? date; 05/04/2013   ? location; DES  to [place]  . CORONARY ARTERY BYPASS GRAFT  07/1991   "CABG X 3"  . EP IMPLANTABLE DEVICE N/A 09/29/2016   Procedure: Pacemaker Implant;  Surgeon: Will Meredith Leeds, MD;  Location: [facility] CV LAB;  Service: Cardiovascular;  Laterality: N/A;  . ESOPHAGOGASTRODUODENOSCOPY N/A 06/12/2014   Procedure: ESOPHAGOGASTRODUODENOSCOPY (EGD);  Surgeon: Cleotis Nipper, MD;  Location: [facility] ENDOSCOPY;  Service: Endoscopy;  Laterality: N/A;  . LEFT AND RIGHT HEART CATHETERIZATION WITH CORONARY ANGIOGRAM N/A 05/04/2013   Procedure: LEFT AND RIGHT HEART CATHETERIZATION WITH CORONARY ANGIOGRAM;  Surgeon: Larey Dresser, MD;  Location: [facility] CATH LAB;  Service: Cardiovascular;  Laterality: N/A;  . PERCUTANEOUS STENT INTERVENTION  05/04/2013   Procedure: PERCUTANEOUS STENT INTERVENTION;  Surgeon: Larey Dresser, MD;  Location: [facility] CATH LAB;  Service: Cardiovascular;;  . TUBAL LIGATION Bilateral 1969     OB History   None      Home Medications    Prior to Admission medications   Medication Sig Start Date End Date Taking? Authorizing Provider  acetaminophen (TYLENOL) 325 MG tablet Take 1-2 tablets (325-650 mg total) by mouth every 4 (four) hours as needed for mild pain. 09/30/16   Shirley Friar, PA-C  carvedilol (COREG) 6.25 MG tablet Take 0.5 tablets (3.125 mg total) by mouth 2 (two) times daily. 04/15/15   Larey Dresser, MD  cycloSPORINE (RESTASIS) 0.05 % ophthalmic emulsion Place 1 drop into both eyes 2 (two) times daily.    [provider]  ezetimibe (ZETIA) 10 MG tablet TAKE 1 TABLET BY MOUTH DAILY. 01/04/18   Larey Dresser, MD  ferrous sulfate 325 (65 FE) MG tablet  Take 1 tablet (325 mg total) by mouth 2 (two) times daily with a meal. 06/12/14   Ghimire, Henreitta Leber, MD  furosemide (LASIX) 40 MG tablet Take 1.5 tablets (60 mg total) by mouth 2 (two) times daily. 08/23/17   Bensimhon, Shaune Pascal, MD  glucose blood test strip Test blood glucose three times daily. 06/30/17   Larey Dresser, MD  ibandronate (BONIVA) 150 MG tablet Take 150 mg by mouth every 30 (thirty) days. Take in the morning with a full glass of water, on an empty stomach, and do not take anything else by mouth or lie down for the next 30 min.    [provider]  isosorbide mononitrate (IMDUR) 60 MG 24 hr tablet TAKE 1 TABLET BY MOUTH 2 TIMES DAILY. 02/16/18   Larey Dresser, MD  levothyroxine (SYNTHROID, LEVOTHROID) 100 MCG tablet Take 100 mcg by mouth daily before breakfast.    [provider]  metFORMIN (  GLUCOPHAGE-XR) 500 MG 24 hr tablet Take 500 mg by mouth 2 (two) times daily.  05/08/13   Dunn, Dayna N, PA-C  NITROSTAT 0.4 MG SL tablet PLACE 1 TABLET (0.4 MG TOTAL) UNDER THE TONGUE EVERY 5 (FIVE) MINUTES AS NEEDED FOR CHEST PAIN. 05/07/15   Larey Dresser, MD  pantoprazole (PROTONIX) 40 MG tablet Take 1 tablet (40 mg total) by mouth daily. 11/30/17   Larey Dresser, MD  phenytoin (DILANTIN) 100 MG ER capsule Take 100-200 mg by mouth 2 (two) times daily. 200 mg in the morning and 100 mg in the evening    [provider]  potassium chloride SA (K-DUR,KLOR-CON) 20 MEQ tablet TAKE 1 TABLET BY MOUTH 2 TIMES DAILY. 12/19/17   Larey Dresser, MD  rosuvastatin (CRESTOR) 40 MG tablet TAKE 1 TABLET BY MOUTH DAILY 09/22/17   Bensimhon, Shaune Pascal, MD  timolol (TIMOPTIC) 0.5 % ophthalmic solution Place 1 drop into both eyes daily.  09/21/16   [provider]  warfarin (COUMADIN) 5 MG tablet TAKE AS DIRECTED BY COUMADIN CLINIC 06/06/17   Camnitz, Ocie Doyne, MD    Family History Family History  Problem Relation Age of Onset  . Diabetes Mother     Social  History Social History   Tobacco Use  . Smoking status: Never Smoker  . Smokeless tobacco: Never Used  Substance Use Topics  . Alcohol use: No  . Drug use: No     Allergies   Meperidine hcl; Nitroglycerin; and Morphine   Review of Systems Review of Systems  Constitutional: Negative for chills, diaphoresis and fever.  Respiratory: Negative for shortness of breath.   Cardiovascular: Positive for chest pain and leg swelling (chronic, unchanged).  Gastrointestinal: Negative for abdominal pain, nausea and vomiting.  Neurological: Negative for syncope.  All other systems reviewed and are negative.    Physical Exam Updated Vital Signs BP (!) 145/54 (BP Location: Right Arm)   Pulse 68   Temp 98.1 F (36.7 C) (Oral)   Resp (!) 21   Ht 5' (1.524 m)   Wt 71.4 kg (157 lb 4.8 oz)   SpO2 99%   BMI 30.72 kg/m   Physical Exam  Constitutional: She appears well-developed and well-nourished. No distress.  HENT:  Head: Normocephalic and atraumatic.  Eyes: Conjunctivae are normal. Right eye exhibits no discharge. Left eye exhibits no discharge.  Neck: No JVD present. No tracheal deviation present.  Cardiovascular: Normal rate and regular rhythm.  Pulses:      Carotid pulses are 2+ on the right side, and 2+ on the left side.      Radial pulses are 2+ on the right side, and 2+ on the left side.       Dorsalis pedis pulses are 2+ on the right side, and 2+ on the left side.       Posterior tibial pulses are 2+ on the right side, and 2+ on the left side.  Occasional aberrant beat, bilateral lower extremity edema, right worse than left.  Patient states this is chronic and unchanged for her.  Homans sign absent bilaterally.  Well-healed surgical scars noted to the medial aspect of both lower extremities.  Pulmonary/Chest: Effort normal and breath sounds normal. No accessory muscle usage. No tachypnea. No respiratory distress.  Equal rise and fall of chest, no increased work of breathing.   Speaking in full sentences without difficulty.  No tenderness to palpation of the chest wall  Abdominal: Soft. Bowel sounds are normal. She exhibits  no distension. There is no tenderness.  Musculoskeletal: She exhibits no edema.       Right lower leg: Normal.       Left lower leg: Normal.  Neurological: She is alert.  Skin: Skin is warm and dry. No erythema.  Psychiatric: She has a normal mood and affect. Her behavior is normal.  Nursing note and vitals reviewed.    ED Treatments / Results  Labs (all labs ordered are listed, but only abnormal results are displayed) Labs Reviewed  BASIC METABOLIC PANEL - Abnormal; Notable for the following components:      Result Value   Calcium 8.0 (*)    All other components within normal limits  CBC - Abnormal; Notable for the following components:   RBC 3.62 (*)    Hemoglobin 11.9 (*)    All other components within normal limits  GLUCOSE, CAPILLARY - Abnormal; Notable for the following components:   Glucose-Capillary 48 (*)    All other components within normal limits  GLUCOSE, CAPILLARY - Abnormal; Notable for the following components:   Glucose-Capillary 114 (*)    All other components within normal limits  BRAIN NATRIURETIC PEPTIDE - Abnormal; Notable for the following components:   B Natriuretic Peptide 232.5 (*)    All other components within normal limits  I-STAT TROPONIN, ED - Abnormal; Notable for the following components:   Troponin i, poc 0.09 (*)    All other components within normal limits  TROPONIN I  HEMOGLOBIN A1C  HEPARIN LEVEL (UNFRACTIONATED)  CBC  TROPONIN I  TROPONIN I  LIPID PANEL  BASIC METABOLIC PANEL    EKG EKG Interpretation  Date/Time:  Saturday March 18 2018 15:01:54 EDT Ventricular Rate:  60 PR Interval:    QRS Duration: 158 QT Interval:  493 QTC Calculation: 493 R Axis:   -68 Text Interpretation:  Sinus rhythm Nonspecific IVCD with LAD LVH with secondary repolarization abnormality Inferior  infarct, acute (RCA) Anterolateral infarct, old Probable RV involvement, suggest recording right precordial leads No significant change since last tracing Confirmed by Zenovia Jarred [phone]) on 03/18/2018 3:11:37 PM Also confirmed by Zenovia Jarred [phone]), editor Laurena Spies [phone])  on 03/18/2018 3:27:17 PM   Radiology Dg Chest 2 View  Result Date: 03/18/2018 CLINICAL DATA:  Chest pain. EXAM: CHEST - 2 VIEW COMPARISON:  Radiographs of March 07, 2017. FINDINGS: Stable cardiomegaly. Sternotomy wires are noted. Left-sided pacemaker is unchanged in position. No pneumothorax or pleural effusion is noted. No acute pulmonary disease is noted. Bony thorax is unremarkable. IMPRESSION: No active cardiopulmonary disease. Electronically Signed   By: Marijo Conception, M.D.   On: 03/18/2018 16:22    Procedures .Critical Care Performed by: Renita Papa, PA-C Authorized by: Renita Papa, PA-C   Critical care provider statement:    Critical care time (minutes):  35   Critical care time was exclusive of:  Teaching time and separately billable procedures and treating other patients   Critical care was necessary to treat or prevent imminent or life-threatening deterioration of the following conditions:  Cardiac failure   Critical care was time spent personally by me on the following activities:  Discussions with consultants, development of treatment plan with patient or surrogate, examination of patient, evaluation of patient's response to treatment, ordering and performing treatments and interventions, ordering and review of laboratory studies, ordering and review of radiographic studies, pulse oximetry, re-evaluation of patient's condition and review of old charts   I assumed direction of critical care for this patient  from another provider in my specialty: no     (including critical care time)  Medications Ordered in ED Medications  heparin ADULT infusion 100 units/mL (25000 units/231mL sodium  chloride 0.45%) (1,000 Units/hr Intravenous New Bag/Given 03/18/18 1845)  carvedilol (COREG) tablet 3.125 mg (3.125 mg Oral Given 03/18/18 2306)  cycloSPORINE (RESTASIS) 0.05 % ophthalmic emulsion 1 drop (1 drop Both Eyes Given 03/18/18 2307)  ezetimibe (ZETIA) tablet 10 mg (has no administration in time range)  ferrous sulfate tablet 325 mg (has no administration in time range)  furosemide (LASIX) tablet 60 mg (has no administration in time range)  isosorbide mononitrate (IMDUR) 24 hr tablet 60 mg (60 mg Oral Given 03/18/18 2308)  levothyroxine (SYNTHROID, LEVOTHROID) tablet 100 mcg (has no administration in time range)  pantoprazole (PROTONIX) EC tablet 40 mg (has no administration in time range)  phenytoin (DILANTIN) ER capsule 200 mg (has no administration in time range)  rosuvastatin (CRESTOR) tablet 40 mg (has no administration in time range)  timolol (TIMOPTIC) 0.5 % ophthalmic solution 1 drop (has no administration in time range)  aspirin EC tablet 81 mg (has no administration in time range)  nitroGLYCERIN (NITROSTAT) SL tablet 0.4 mg (has no administration in time range)  acetaminophen (TYLENOL) tablet 650 mg (650 mg Oral Given 03/18/18 2306)  ondansetron (ZOFRAN) injection 4 mg (has no administration in time range)  insulin aspart (novoLOG) injection 0-15 Units (has no administration in time range)  phenytoin (DILANTIN) ER capsule 100 mg (100 mg Oral Given 03/18/18 2306)  heparin bolus via infusion 4,000 Units (4,000 Units Intravenous Bolus from Bag 03/18/18 1845)     Initial Impression / Assessment and Plan / ED Course  I have reviewed the triage vital signs and the nursing notes.  Pertinent labs & imaging results that were available during my care of the patient were reviewed by me and considered in my medical decision making (see chart for details).     Patient with extensive cardiac history presents for evaluation of sudden onset left-sided chest pressure which resolved with one  nitroglycerin.  She is afebrile, vital signs are stable.  She is nontoxic in appearance.  Currently pain-free.  EKG shows no acute changes from last tracing.  Chest x-ray shows no active cardiopulmonary disease.  Initial troponin elevated at 0.09 which appears to be acute.  Remainder of lab work reviewed by me shows mild stable anemia, no electrolyte abnormalities, no leukocytosis.  Spoke with Dr. Rhae Hammock with cardiology who recommends pharmacy consult for initiation of heparin.  Patient is currently subtherapeutic with her warfarin.  She will be admitted to cardiology service for further evaluation and management.  Patient seen and evaluated by Dr. Thomasene Lot who agrees with assessment plan at this time. Final Clinical Impressions(s) / ED Diagnoses   Final diagnoses:  NSTEMI (non-ST elevated myocardial infarction) [facility])    ED Discharge Orders    None       Renita Papa, PA-C 03/19/18 0135    Macarthur Critchley, MD 03/21/18 0745

## 2018-03-19 LAB — HEPARIN LEVEL (UNFRACTIONATED): HEPARIN UNFRACTIONATED: 1.08 [IU]/mL — AB (ref 0.30–0.70)

## 2018-03-19 LAB — TROPONIN I
Troponin I: <0.03 ng/mL (ref <0.03)
Troponin I: <0.03 ng/mL (ref <0.03)

## 2018-03-19 LAB — CBC
HEMATOCRIT: 30.5 % — AB (ref 36.0–46.0)
Hemoglobin: 10.2 g/dL — ABNORMAL LOW (ref 12.0–15.0)
MCH: 32.6 pg (ref 26.0–34.0)
MCHC: 33.4 g/dL (ref 30.0–36.0)
MCV: 97.4 fL (ref 78.0–100.0)
Platelets: 136 10*3/uL — ABNORMAL LOW (ref 150–400)
RBC: 3.13 MIL/uL — AB (ref 3.87–5.11)
RDW: 11.9 % (ref 11.5–15.5)
WBC: 5.4 10*3/uL (ref 4.0–10.5)

## 2018-03-19 LAB — BASIC METABOLIC PANEL
Anion gap: 8 (ref 5–15)
BUN: 12 mg/dL (ref 6–20)
CHLORIDE: 107 mmol/L (ref 101–111)
CO2: 25 mmol/L (ref 22–32)
CREATININE: 0.78 mg/dL (ref 0.44–1.00)
Calcium: 7.7 mg/dL — ABNORMAL LOW (ref 8.9–10.3)
GFR calc Af Amer: >60 mL/min (ref >60)
GFR calc non Af Amer: >60 mL/min (ref >60)
GLUCOSE: 70 mg/dL (ref 65–99)
Potassium: 3.7 mmol/L (ref 3.5–5.1)
SODIUM: 140 mmol/L (ref 135–145)

## 2018-03-19 LAB — GLUCOSE, CAPILLARY
GLUCOSE-CAPILLARY: 77 mg/dL (ref 65–99)
Glucose-Capillary: 79 mg/dL (ref 65–99)
Glucose-Capillary: 78 mg/dL (ref 65–99)
Glucose-Capillary: 124 mg/dL — ABNORMAL HIGH (ref 65–99)

## 2018-03-19 LAB — LIPID PANEL
CHOL/HDL RATIO: 3 ratio
Cholesterol: 112 mg/dL (ref 0–200)
HDL: 37 mg/dL — AB (ref >40)
LDL Cholesterol: 63 mg/dL (ref 0–99)
Triglycerides: 58 mg/dL (ref <150)
VLDL: 12 mg/dL (ref 0–40)

## 2018-03-19 MED ORDER — CARVEDILOL 6.25 MG PO TABS
6.2500 mg | ORAL_TABLET | Freq: Two times a day (BID) | ORAL | Status: DC
  Administered 2018-03-19 – 2018-03-23 (×8): 6.25 mg via ORAL
  Filled 2018-03-19 (×8): qty 1

## 2018-03-19 MED ORDER — ENOXAPARIN SODIUM 40 MG/0.4ML ~~LOC~~ SOLN
40.0000 mg | SUBCUTANEOUS | Status: DC
  Administered 2018-03-23: 40 mg via SUBCUTANEOUS
  Filled 2018-03-19: qty 0.4

## 2018-03-19 MED ORDER — HEPARIN (PORCINE) IN NACL 100-0.45 UNIT/ML-% IJ SOLN
800.0000 [IU]/h | INTRAMUSCULAR | Status: DC
  Filled 2018-03-19: qty 250

## 2018-03-19 NOTE — Progress Notes (Signed)
Subjective:  She was admitted last night with an episode of pressure-like chest pain that occurred while she was attending a memorial service for a relative that died and had been under stress.  Has not had any chest pain since then.  Initial troponin was evidently  Abnormal but subsequent troponins have been normal.  Her EKG shows a paced rhythm.  Objective:  Vital Signs in the last 24 hours: BP (!) 111/54 (BP Location: Right Arm)   Pulse (!) 59   Temp 98.2 F (36.8 C) (Oral)   Resp 20   Ht 5' (1.524 m)   Wt 72 kg (158 lb 12.8 oz)   SpO2 95%   BMI 31.01 kg/m   Physical Exam: Elderly black female in no acute distress Lungs:  Clear Cardiac:  Regular rhythm, normal S1 and S2, no S3, 2/6 systolic murmur Abdomen:  Soft, nontender, no masses Extremities:  No edema present  Intake/Output from previous day: 06/22 0701 - 06/23 0700 In: 369.7 [P.O.:240; I.V.:129.7] Out: 250 [Urine:250]  Weight Filed Weights   03/18/18 1504 03/18/18 2042 03/19/18 0448  Weight: 71.7 kg (158 lb) 71.4 kg (157 lb 4.8 oz) 72 kg (158 lb 12.8 oz)    Lab Results: Basic Metabolic Panel: Recent Labs    03/18/18 1525 03/19/18 0430  NA 140 140  K 3.7 3.7  CL 106 107  CO2 25 25  GLUCOSE 86 70  BUN 10 12  CREATININE 0.83 0.78   CBC: Recent Labs    03/18/18 1525 03/19/18 0430  WBC 5.1 5.4  HGB 11.9* 10.2*  HCT 36.2 30.5*  MCV 100.0 97.4  PLT 169 136*   Cardiac Enzymes: Troponin (Point of Care Test) Recent Labs    03/18/18 1531  TROPIPOC 0.09*   Cardiac Panel (last 3 results) Recent Labs    03/18/18 2224 03/19/18 0430 03/19/18 1041  TROPONINI <0.03 <0.03 <0.03    Telemetry: paced rhythm  Assessment/Plan:  1.  Isolated episode of chest discomfort occurring during a funeral with subsequent negative troponins.  I'm inclined to believe that this is not a non-STEMI 2.  CAD with catheterization showing an occluded vein graft a year ago 3.  Functioning  pacemaker  Recommendations:  I would be inclined to observe this.  I'm going to stop her heparin and have her ambulate.  If no recurrent chest pain then we might to discharge her tomorrow on intensified medical therapy.    Kerry Hough  MD [facility] Cardiology  03/19/2018, 12:30 PM

## 2018-03-19 NOTE — Progress Notes (Signed)
[facility] for Heparin Indication: chest pain/ACS  Allergies  Allergen Reactions  . Meperidine Hcl Other (See Comments)    Demerol - mental status changes    . Nitroglycerin Other (See Comments)    Patch and Cream only - blisters  . Morphine Palpitations    Patient Measurements: Height: 5' (152.4 cm) Weight: 157 lb 4.8 oz (71.4 kg) IBW/kg (Calculated) : 45.5 Heparin Dosing Weight: 62  Vital Signs: Temp: 98.2 F (36.8 C) (06/23 0448) Temp Source: Oral (06/23 0448) BP: 111/54 (06/23 0448) Pulse Rate: 59 (06/23 0448)  Labs: Recent Labs    03/17/18 1014 03/18/18 1525 03/18/18 2224 03/19/18 0430  HGB  --  11.9*  --  10.2*  HCT  --  36.2  --  30.5*  PLT  --  169  --  136*  INR 1.2*  --   --   --   HEPARINUNFRC  --   --   --  1.08*  CREATININE  --  0.83  --  0.78  TROPONINI  --   --  <0.03  --     Estimated Creatinine Clearance: 52 mL/min (by C-G formula based on SCr of 0.78 mg/dL).  Assessment: 77 y.o. female with chest pain, h/o Afib and INR subtherapeutic, for heparin  Goal of Therapy:  Heparin level 0.3-0.7 units/ml Monitor platelets by anticoagulation protocol: Yes   Plan:  Hold heparin x 45 min, then decrease heparin 800 units/hr Check heparin level in 8 hours.    Caryl Pina 03/19/2018,5:43 AM

## 2018-03-20 ENCOUNTER — Inpatient Hospital Stay (HOSPITAL_COMMUNITY): Payer: Medicare Other

## 2018-03-20 DIAGNOSIS — R072 Precordial pain: Secondary | ICD-10-CM

## 2018-03-20 DIAGNOSIS — I351 Nonrheumatic aortic (valve) insufficiency: Secondary | ICD-10-CM

## 2018-03-20 DIAGNOSIS — I340 Nonrheumatic mitral (valve) insufficiency: Secondary | ICD-10-CM

## 2018-03-20 LAB — CBC
HCT: 31.7 % — ABNORMAL LOW (ref 36.0–46.0)
Hemoglobin: 10.4 g/dL — ABNORMAL LOW (ref 12.0–15.0)
MCH: 32.2 pg (ref 26.0–34.0)
MCHC: 32.8 g/dL (ref 30.0–36.0)
MCV: 98.1 fL (ref 78.0–100.0)
PLATELETS: 147 10*3/uL — AB (ref 150–400)
RBC: 3.23 MIL/uL — ABNORMAL LOW (ref 3.87–5.11)
RDW: 11.9 % (ref 11.5–15.5)
WBC: 4.5 10*3/uL (ref 4.0–10.5)

## 2018-03-20 LAB — BASIC METABOLIC PANEL
Anion gap: 8 (ref 5–15)
BUN: 10 mg/dL (ref 6–20)
CO2: 27 mmol/L (ref 22–32)
CREATININE: 0.8 mg/dL (ref 0.44–1.00)
Calcium: 7.7 mg/dL — ABNORMAL LOW (ref 8.9–10.3)
Chloride: 107 mmol/L (ref 101–111)
GFR calc Af Amer: >60 mL/min (ref >60)
Glucose, Bld: 97 mg/dL (ref 65–99)
Potassium: 3.4 mmol/L — ABNORMAL LOW (ref 3.5–5.1)
SODIUM: 142 mmol/L (ref 135–145)

## 2018-03-20 LAB — GLUCOSE, CAPILLARY
GLUCOSE-CAPILLARY: 115 mg/dL — AB (ref 65–99)
Glucose-Capillary: 104 mg/dL — ABNORMAL HIGH (ref 65–99)
Glucose-Capillary: 126 mg/dL — ABNORMAL HIGH (ref 65–99)
Glucose-Capillary: 83 mg/dL (ref 65–99)

## 2018-03-20 LAB — ECHOCARDIOGRAM COMPLETE
Height: 60 in
Weight: 2540.8 oz

## 2018-03-20 MED ORDER — POTASSIUM CHLORIDE CRYS ER 20 MEQ PO TBCR
40.0000 meq | EXTENDED_RELEASE_TABLET | Freq: Once | ORAL | Status: AC
  Administered 2018-03-20: 40 meq via ORAL
  Filled 2018-03-20: qty 2

## 2018-03-20 NOTE — Progress Notes (Signed)
Progress Note  Patient Name: Christine Gay Date of Encounter: 03/20/2018  Primary Cardiologist: Dr Aundra Dubin  Subjective   No chest pain; some dyspnea on exertion.  Inpatient Medications    Scheduled Meds: . aspirin EC  81 mg Oral Daily  . carvedilol  6.25 mg Oral BID  . cycloSPORINE  1 drop Both Eyes BID  . enoxaparin (LOVENOX) injection  40 mg Subcutaneous Q24H  . ezetimibe  10 mg Oral Daily  . ferrous sulfate  325 mg Oral BID WC  . furosemide  60 mg Oral BID  . insulin aspart  0-15 Units Subcutaneous TID WC  . isosorbide mononitrate  60 mg Oral BID  . levothyroxine  100 mcg Oral QAC breakfast  . pantoprazole  40 mg Oral Daily  . phenytoin  100 mg Oral QHS  . phenytoin  200 mg Oral Daily  . rosuvastatin  40 mg Oral Daily  . timolol  1 drop Both Eyes Daily   Continuous Infusions:  PRN Meds: acetaminophen, nitroGLYCERIN, ondansetron (ZOFRAN) IV   Vital Signs    Vitals:   03/19/18 0448 03/19/18 1648 03/19/18 2014 03/20/18 0427  BP: (!) 111/54 125/61 100/68 (!) 146/123  Pulse: (!) 59 (!) 59 60 60  Resp: 20  18   Temp: 98.2 F (36.8 C) 97.8 F (36.6 C)  98.2 F (36.8 C)  TempSrc: Oral Oral  Oral  SpO2: 95% 100% 95% 96%  Weight: 158 lb 12.8 oz (72 kg)     Height:        Intake/Output Summary (Last 24 hours) at 03/20/2018 0950 Last data filed at 03/19/2018 2100 Gross per 24 hour  Intake 753.64 ml  Output 1700 ml  Net -946.36 ml   Filed Weights   03/18/18 1504 03/18/18 2042 03/19/18 0448  Weight: 158 lb (71.7 kg) 157 lb 4.8 oz (71.4 kg) 158 lb 12.8 oz (72 kg)    Telemetry    Sinus with ventricular pacing- Personally Reviewed  Physical Exam   GEN: No acute distress.   Neck: No JVD Cardiac: RRR, no murmurs, rubs, or gallops.  Respiratory: Clear to auscultation bilaterally. GI: Soft, nontender, non-distended  MS: No edema Neuro:  Nonfocal  Psych: Normal affect   Labs    Chemistry Recent Labs  Lab 03/18/18 1525 03/19/18 0430 03/20/18 0512    NA 140 140 142  K 3.7 3.7 3.4*  CL 106 107 107  CO2 25 25 27   GLUCOSE 86 70 97  BUN 10 12 10   CREATININE 0.83 0.78 0.80  CALCIUM 8.0* 7.7* 7.7*  GFRNONAA >60 >60 >60  GFRAA >60 >60 >60  ANIONGAP 9 8 8      Hematology Recent Labs  Lab 03/18/18 1525 03/19/18 0430 03/20/18 0512  WBC 5.1 5.4 4.5  RBC 3.62* 3.13* 3.23*  HGB 11.9* 10.2* 10.4*  HCT 36.2 30.5* 31.7*  MCV 100.0 97.4 98.1  MCH 32.9 32.6 32.2  MCHC 32.9 33.4 32.8  RDW 11.9 11.9 11.9  PLT 169 136* 147*    Cardiac Enzymes Recent Labs  Lab 03/18/18 2224 03/19/18 0430 03/19/18 1041  TROPONINI <0.03 <0.03 <0.03    Recent Labs  Lab 03/18/18 1531  TROPIPOC 0.09*     BNP Recent Labs  Lab 03/18/18 2224  BNP 232.5*     Radiology    Dg Chest 2 View  Result Date: 03/18/2018 CLINICAL DATA:  Chest pain. EXAM: CHEST - 2 VIEW COMPARISON:  Radiographs of March 07, 2017. FINDINGS: Stable cardiomegaly. Sternotomy wires are  noted. Left-sided pacemaker is unchanged in position. No pneumothorax or pleural effusion is noted. No acute pulmonary disease is noted. Bony thorax is unremarkable. IMPRESSION: No active cardiopulmonary disease. Electronically Signed   By: Marijo Conception, M.D.   On: 03/18/2018 16:22    Patient Profile     77 y.o. female with past medical history of coronary artery disease status post coronary artery bypass and graft admitted with chest pain similar to previous angina by her report.  Enzymes negative.    Assessment & Plan    1 Chest pain-she has had no recurrent symptoms but notes some dyspnea.  Enzymes are negative.  I will arrange a [place] nuclear study tomorrow for risk stratification.  If normal or low risk would plan medical therapy.  Otherwise would require cardiac catheterization.  2 chronic diastolic congestive heart failure-patient is euvolemic.  Continue present dose of Lasix.  3 hypertension-blood pressure is controlled.  Continue present medications.  4 coronary artery disease  status post coronary artery bypass graft-continue aspirin and statin.  5 history of paroxysmal atrial fibrillation-patient remains in sinus rhythm.  Coumadin on hold if cardiac catheterization necessary.  We will resume at discharge.  For questions or updates, please contact [place] Please consult [email or web address] for contact info under Cardiology/STEMI.      Signed, Kirk Ruths, MD  03/20/2018, 9:50 AM

## 2018-03-20 NOTE — Progress Notes (Signed)
2D Echocardiogram has been performed.  Christine Gay 03/20/2018, 11:19 AM

## 2018-03-21 ENCOUNTER — Inpatient Hospital Stay (HOSPITAL_COMMUNITY): Payer: Medicare Other

## 2018-03-21 DIAGNOSIS — R079 Chest pain, unspecified: Secondary | ICD-10-CM

## 2018-03-21 LAB — BASIC METABOLIC PANEL
ANION GAP: 6 (ref 5–15)
BUN: 13 mg/dL (ref 8–23)
CALCIUM: 7.8 mg/dL — AB (ref 8.9–10.3)
CO2: 27 mmol/L (ref 22–32)
Chloride: 107 mmol/L (ref 98–111)
Creatinine, Ser: 0.92 mg/dL (ref 0.44–1.00)
GFR calc non Af Amer: 59 mL/min — ABNORMAL LOW (ref >60)
Glucose, Bld: 84 mg/dL (ref 70–99)
Potassium: 3.9 mmol/L (ref 3.5–5.1)
Sodium: 140 mmol/L (ref 135–145)

## 2018-03-21 LAB — CBC
HEMATOCRIT: 32.3 % — AB (ref 36.0–46.0)
Hemoglobin: 10.5 g/dL — ABNORMAL LOW (ref 12.0–15.0)
MCH: 32.4 pg (ref 26.0–34.0)
MCHC: 32.5 g/dL (ref 30.0–36.0)
MCV: 99.7 fL (ref 78.0–100.0)
PLATELETS: 157 10*3/uL (ref 150–400)
RBC: 3.24 MIL/uL — ABNORMAL LOW (ref 3.87–5.11)
RDW: 11.9 % (ref 11.5–15.5)
WBC: 4.4 10*3/uL (ref 4.0–10.5)

## 2018-03-21 LAB — NM MYOCAR MULTI W/SPECT W/WALL MOTION / EF
CHL CUP RESTING HR STRESS: 60 {beats}/min
Peak HR: 82 {beats}/min

## 2018-03-21 LAB — GLUCOSE, CAPILLARY
GLUCOSE-CAPILLARY: 71 mg/dL (ref 70–99)
Glucose-Capillary: 98 mg/dL (ref 70–99)
Glucose-Capillary: 73 mg/dL (ref 70–99)
Glucose-Capillary: 122 mg/dL — ABNORMAL HIGH (ref 70–99)

## 2018-03-21 MED ORDER — REGADENOSON 0.4 MG/5ML IV SOLN
INTRAVENOUS | Status: AC
  Filled 2018-03-21: qty 5

## 2018-03-21 MED ORDER — SODIUM CHLORIDE 0.9 % IV SOLN: 250.0000 mL | INTRAVENOUS | Status: DC | PRN

## 2018-03-21 MED ORDER — REGADENOSON 0.4 MG/5ML IV SOLN
0.4000 mg | Freq: Once | INTRAVENOUS | Status: AC
  Administered 2018-03-21: 0.4 mg via INTRAVENOUS
  Filled 2018-03-21: qty 5

## 2018-03-21 MED ORDER — SODIUM CHLORIDE 0.9% FLUSH
3.0000 mL | Freq: Two times a day (BID) | INTRAVENOUS | Status: DC
  Administered 2018-03-21: 3 mL via INTRAVENOUS

## 2018-03-21 MED ORDER — SODIUM CHLORIDE 0.9% FLUSH: 3.0000 mL | INTRAVENOUS | Status: DC | PRN

## 2018-03-21 MED ORDER — TECHNETIUM TC 99M TETROFOSMIN IV KIT
30.0000 | PACK | Freq: Once | INTRAVENOUS | Status: AC | PRN
  Administered 2018-03-21: 30 via INTRAVENOUS

## 2018-03-21 MED ORDER — SODIUM CHLORIDE 0.9 % WEIGHT BASED INFUSION
1.0000 mL/kg/h | INTRAVENOUS | Status: DC
  Administered 2018-03-22: 1 mL/kg/h via INTRAVENOUS

## 2018-03-21 MED ORDER — SODIUM CHLORIDE 0.9 % WEIGHT BASED INFUSION: 3.0000 mL/kg/h | INTRAVENOUS | Status: AC

## 2018-03-21 MED ORDER — TECHNETIUM TC 99M TETROFOSMIN IV KIT
10.0000 | PACK | Freq: Once | INTRAVENOUS | Status: AC | PRN
  Administered 2018-03-21: 10 via INTRAVENOUS

## 2018-03-21 MED ORDER — ASPIRIN 81 MG PO CHEW: 81.0000 mg | CHEWABLE_TABLET | ORAL | Status: DC

## 2018-03-21 NOTE — Progress Notes (Signed)
    Patient presented for Lexiscan nuclear stress test. Tolerated procedure well. Pending final stress imaging result.  Daune Perch, AGNP-C 03/21/2018  10:42 AM Pager: [phone]

## 2018-03-21 NOTE — Care Management Important Message (Signed)
Important Message  Patient Details  Name: JOCI DRESS MRN: [id number] Date of Birth: 10-06-1940   Medicare Important Message Given:  Yes    Barb Merino Lockhart 03/21/2018, 2:49 PM

## 2018-03-21 NOTE — Discharge Summary (Signed)
Discharge Summary    Patient ID: Christine Gay,  MRN: [id number], DOB/AGE: 77-Jul-1942 77 y.o.  Admit date: 03/18/2018 Discharge date: 03/23/2018  Primary Care Provider: Seward Carol Primary Cardiologist: [name], MD  Discharge Diagnoses    Active Problems:   HYPERCHOLESTEROLEMIA  IIA   HYPERTENSION, BENIGN   CAD, ARTERY BYPASS GRAFT   CHF (congestive heart failure) (HCC)   CAD (coronary artery disease)   Third degree heart block [facility])   Atrial fibrillation (HCC)   NSTEMI (non-ST elevated myocardial infarction) (HCC)   Abnormal nuclear cardiac imaging test   Chronic anticoagulation   Pacemaker  Allergies Allergies  Allergen Reactions  . Meperidine Hcl Other (See Comments)    Demerol - mental status changes    . Nitroglycerin Other (See Comments)    Patch and Cream only - blisters  . Morphine Palpitations    Diagnostic Studies/Procedures    Lexiscan Myoview stress test 03/21/2018:  IMPRESSION: 1. Large size moderate severity anterior wall and anterior apical region of inducible ischemia, up to 21% loss of activity. Inferolateral wall mild inducible ischemia and underlying scarring a moderate-sized area of in the mid heart and basilar region. Moderate end- diastolic left ventricular enlargement and considerable end- systolic left ventricular enlargement.  2. Hypokinesis and poor wall thickening is noted above.  3. Left ventricular ejection fraction 44%  4. Non invasive risk stratification*: High  Cardiac catheterization 03/22/18:   LM lesion is 35% stenosed.  Prox LAD lesion is 100% stenosed.  Prox Cx to Mid Cx lesion is 30% stenosed.  Ost 2nd Mrg to 2nd Mrg lesion is 100% stenosed.  Ost 1st Mrg to 1st Mrg lesion is 100% stenosed.  Ost RCA to Prox RCA lesion is 100% stenosed.  LIMA and is normal in caliber.  SVG and is normal in caliber.  SVG due to known occlusion.  Origin to Prox Graft lesion is 100% stenosed.  Mid LM lesion  is 50% stenosed.   1. Severe native vessel CAD with total occlusion of the LAD, RCA, and OM branch of the circumflex 2. S/P CABG With continued patency of the SVG-OM and LIMA-LAD 3. Moderate left main stenosis and mild LCx in-stent restenosis  The patient has stable coronary anatomy compared to her cath study in January 2018. The LAD and OM grafts remain patent and there is 40-50% left main stenosis. Favor ongoing medical therapy considering negative cardiac enzymes and stable anatomy.   History of Present Illness     Christine Gay is a 77 y.o. female with a history of CAD with prior CABG (1992) with subsequent PCI, CHB s/p PPM, HFpEF, AF, HTN, HLD who presented to [facility] with NSTEMI on 03/18/18.  Follows with Dr. Aundra Dubin for management of her multiple cardiac comorbidities.  She was last seen in the clinic on 06/2017 and was doing well without chest pain.  Her last LHC was in 09/2016 which showed patent LIMA-LAD and SVG-OM1, patent LCx stent and occluded SVG-OM 2 (known from past) with no evidence for plaque rupture/new disease.  Her last echocardiogram showed EF stable at 50-55%. She follows with Dr. Curt Bears for her device management.  Christine Gay presented to [facility] on 03/18/2018 with chest pain. She reported that she was at a funeral when she developed sudden onset chest pressure with radiation to her left shoulder. She reported no associated symptoms including dyspnea or diaphoresis. Her pain resolved with one nitroglycerin tablet. She reported this pain to be similar to that of her  prior MI.   Hospital Course     In the ED, an EKG showed paced rhythm, similar to prior tracing.  Her initial troponin was positive at 0.09.  Her INR was 1.2.  She was started on heparin gtt and cardiology was consulted for admission. A repeat echocardiogram on 03/20/2018 showed an LVEF of 50 to 55% with hypokinesis of the basalinferolateral myocardium with G1 DD, consistent with prior echocardiogram on 09/29/2016. She had  no recurrent symptoms throughout her hospital course. Her troponin levels remained negative. Plans were made for a [place] nuclear study for stratification scheduled for 03/21/2018. Recommendations were for if normal or low risk study with plan for medical therapy otherwise, would require cardiac catheterization.   Unfortunately, her stress results revealed large size moderate severity anterior wall and anterior apical egion of inducible ischemia, up to 21% loss of activity and inferolateral wall mild inducible ischemia and underlying scarring moderate-sized area of in the mid heart and basilar region. Therefore, she was scheduled for a cardiac catheterization on 03/22/18. Her cath showed severe native vessel CAD with total occlusion of the LAD, RCA, and OM branch of the circumflex s/p CABG with continued patency of the SVG-OM and LIMA-LAD and moderate left main stenosis and mild LCx in-stent restenosis considered stable coronary anatomy from prior cardiac cath in 09/2016. Recommendations were made for ongoing medical therapy given her negative enzymes and stable anatomy.   Renal function and procedure site have remained stable post cath. We will include post cath instructions for discharge and will have her follow with Dr. Aundra Dubin in 6-8 weeks. Will set her up for INR check on Monday 03/27/18. INR on 03/22/18, 1.22.   Other hospital problems: -Chronic diastolic congestive heart failure: Patient is remained euvolemic and will continue prior to admission dose of Lasix  -Hypertension: Blood pressures have been well controlled throughout her course and will resume prior to admission medications.   -Coronary artery disease status post coronary artery bypass graft: Continue aspirin and statin therapy  -History of paroxysmal atrial fibrillation: Patient has remained in NSR.  Coumadin will be restarted at prior to admission dose and she will need to have her INR checked on Monday 03/27/18. INR on 03/22/18 was  1.22. If atrial fibrillation occurs, consider increasing carvedilol.   Consultants: None  Patient was seen and examined by Dr. Stanford Breed he feels that she is stable and ready for discharge on 03/23/2018. _____________  Discharge Vitals Blood pressure (!) 114/43, pulse (!) 59, temperature 98.3 F (36.8 C), temperature source Oral, resp. rate 16, height 5' (1.524 m), weight 156 lb 14.4 oz (71.2 kg), SpO2 100 %.  Filed Weights   03/21/18 0628 03/22/18 0500 03/23/18 0553  Weight: 157 lb 4.8 oz (71.4 kg) 156 lb 14.4 oz (71.2 kg) 156 lb 14.4 oz (71.2 kg)   Labs & Radiologic Studies    CBC Recent Labs    03/22/18 0523 03/23/18 0520  WBC 4.9 4.8  HGB 10.5* 10.2*  HCT 32.2* 30.9*  MCV 98.8 97.5  PLT 160 518   Basic Metabolic Panel Recent Labs    03/22/18 0523 03/23/18 0520  NA 140 138  K 3.5 3.7  CL 106 107  CO2 27 26  GLUCOSE 102* 92  BUN 12 12  CREATININE 0.88 0.88  CALCIUM 7.8* 7.6*   Dg Chest 2 View  Result Date: 03/18/2018 CLINICAL DATA:  Chest pain. EXAM: CHEST - 2 VIEW COMPARISON:  Radiographs of March 07, 2017. FINDINGS: Stable cardiomegaly. Sternotomy wires are noted.  Left-sided pacemaker is unchanged in position. No pneumothorax or pleural effusion is noted. No acute pulmonary disease is noted. Bony thorax is unremarkable. IMPRESSION: No active cardiopulmonary disease. Electronically Signed   By: Marijo Conception, M.D.   On: 03/18/2018 16:22   Nm Myocar Multi W/spect W/wall Motion / Ef  Result Date: 03/21/2018 CLINICAL DATA:  Chest pain and prior myocardial infarction. Abnormal EKG. Prior stent and CABG. Hypertension. Coronary artery disease. Lower extremity edema. EXAM: MYOCARDIAL IMAGING WITH SPECT (REST AND PHARMACOLOGIC-STRESS) GATED LEFT VENTRICULAR WALL MOTION STUDY LEFT VENTRICULAR EJECTION FRACTION TECHNIQUE: Standard myocardial SPECT imaging was performed after resting intravenous injection of 10 mCi Tc-54m tetrofosmin. Subsequently, intravenous infusion of  Lexiscan was performed under the supervision of the Cardiology staff. At peak effect of the drug, 30 mCi Tc-48m tetrofosmin was injected intravenously and standard myocardial SPECT imaging was performed. Quantitative gated imaging was also performed to evaluate left ventricular wall motion, and estimate left ventricular ejection fraction. COMPARISON:  None. FINDINGS: Perfusion: Apical scarring or thinning. Large size moderate severity reduction in anterior wall and anterior apical activity, up to 21% loss of activity on stress images as compared to rest images, compatible with inducible ischemia. In addition, in the mid heart and basilar inferolateral wall there is a moderate size mild severity focus of inducible ischemia and mild underlying scarring. Wall Motion: Lateral and apical hypokinesis. Poor wall thickening in the apex, inferior wall, and lateral wall. Left Ventricular Ejection Fraction: 44 % End diastolic volume 017 ml (moderately abnormally elevated) End systolic volume 71 ml (considerably abnormally elevated) IMPRESSION: 1. Large size moderate severity anterior wall and anterior apical region of inducible ischemia, up to 21% loss of activity. Inferolateral wall mild inducible ischemia and underlying scarring a moderate-sized area of in the mid heart and basilar region. Moderate end- diastolic left ventricular enlargement and considerable end- systolic left ventricular enlargement. 2. Hypokinesis and poor wall thickening is noted above. 3. Left ventricular ejection fraction 44% 4. Non invasive risk stratification*: High *2012 Appropriate Use Criteria for Coronary Revascularization Focused Update: J Am Coll Cardiol. 4944;96(7):591-638. http://content.airportbarriers.com.aspx?articleid=[email or web address] Electronically Signed   By: Van Clines M.D.   On: 03/21/2018 12:51   Disposition   Pt is being discharged home today in good condition.  Follow-up Plans & Appointments    Follow-up Information     [facility] Follow up on 03/27/2018.   Specialty:  Cardiology Why:  Your INR check will be on 03/27/18 at 2pm. Please arrive by 145pm.  Contact information: [address] [place]       Larey Dresser, MD Follow up on 05/12/2018.   Specialty:  Cardiology Why:  Your follow up appointment with Dr. Aundra Dubin will be on 05/12/18 at 1020am. Please arrive to your appointment by 1000am.  Contact information: [address] [place] [address] [phone]          Discharge Instructions    (HEART FAILURE PATIENTS) Call MD:  Anytime you have any of the following symptoms: 1) 3 pound weight gain in 24 hours or 5 pounds in 1 week 2) shortness of breath, with or without a dry hacking cough 3) swelling in the hands, feet or stomach 4) if you have to sleep on extra pillows at night in order to breathe.   Complete by:  As directed    AMB Referral to [facility]   Complete by:  As directed    Patient  is a 77 y/o female with history of coronary artery disease with prior CABG- coronary artery bypass graft (1992), complete heart block status post permanent pacemaker, HF, Atrial fibrillation, HTN, HLD, DM and, hypothyroidism. Echo on 03/20/18 with EF 50- 55%, continues on Lasix.  [place] scaleat home but only checking weight and recording once a week. Daughter reports that patient usually has lower extremity swelling at home. Patienthas minimal awareness of thesigns and symptomsofHFthatwill need medical assistance, HF zones/ tool and ways to manage heart failure including diet restrictions. Her daughter is awareofreportable weight gains but states that patient will need to be reminded and reinforced on ways to manage chronic health issues specifically on diet due to non-adherence in relation to HF and DM ("prefers to eat ice cream rather than sherbet, drinks soda instead of  fresh fruit juice, still using salt/ salt shaker").  Patient verbally agreed and [facility] for further disease education and management of health conditions (HF/ DM)after discharge.    Reason for consult:  Referral to [facility] for further disease education (reinforcement on diet adherence) and management of health conditions (HF/ DM)after discharge..   Diagnoses of:   Heart Failure Diabetes     Expected date of contact:  1-3 days (reserved for hospital discharges)   Call MD for:  difficulty breathing, headache or visual disturbances   Complete by:  As directed    Call MD for:  persistant dizziness or light-headedness   Complete by:  As directed    Call MD for:  persistant nausea and vomiting   Complete by:  As directed    Call MD for:  redness, tenderness, or signs of infection (pain, swelling, redness, odor or green/yellow discharge around incision site)   Complete by:  As directed    Call MD for:  severe uncontrolled pain   Complete by:  As directed    Call MD for:  temperature >100.4   Complete by:  As directed    Diet - low sodium heart healthy   Complete by:  As directed    Discharge instructions   Complete by:  As directed    You will resume your prior to admission dose of Coumadin. You have an INR check appointment on Monday 03/27/18 at 2pm. You have a follow up appointment with Dr. Aundra Dubin on 05/12/18 at 1020am.   No driving for 3 days. No lifting over 5 lbs for 1 week. No sexual activity for 1 week. Keep procedure site clean & dry. If you notice increased pain, swelling, bleeding or pus, call/return!  You may shower, but no soaking baths/hot tubs/pools for 1 week.   1. Follow a low-salt diet - you are allowed no more than 2,000mg  of sodium per day. Watch your fluid intake. In general, you should not be taking in more than 2 liters of fluid per day (no more than 8 glasses per day). This includes sources of water in foods like soup, coffee,  tea, milk, etc. 2. Weigh yourself on the same scale at same time of day and keep a log. 3. Call your doctor: (Anytime you feel any of the following symptoms)  - 3lb weight gain overnight or 5lb within a few days - Shortness of breath, with or without a dry hacking cough  - Swelling in the hands, feet or stomach  - If you have to sleep on extra pillows at night in order to breathe   IT IS IMPORTANT TO LET YOUR DOCTOR KNOW EARLY ON  IF YOU ARE HAVING SYMPTOMS SO WE CAN HELP YOU!   Increase activity slowly   Complete by:  As directed      Discharge Medications   Allergies as of 03/23/2018      Reactions   Meperidine Hcl Other (See Comments)   Demerol - mental status changes    Nitroglycerin Other (See Comments)   Patch and Cream only - blisters   Morphine Palpitations      Medication List    TAKE these medications   acetaminophen 325 MG tablet Commonly known as:  TYLENOL Take 1-2 tablets (325-650 mg total) by mouth every 4 (four) hours as needed for mild pain.   aspirin 81 MG EC tablet Take 1 tablet (81 mg total) by mouth daily. Start taking on:  03/24/2018   carvedilol 6.25 MG tablet Commonly known as:  COREG Take 0.5 tablets (3.125 mg total) by mouth 2 (two) times daily.   cycloSPORINE 0.05 % ophthalmic emulsion Commonly known as:  RESTASIS Place 1 drop into both eyes 2 (two) times daily.   ezetimibe 10 MG tablet Commonly known as:  ZETIA TAKE 1 TABLET BY MOUTH DAILY.   ferrous sulfate 325 (65 FE) MG tablet Take 1 tablet (325 mg total) by mouth 2 (two) times daily with a meal.   furosemide 40 MG tablet Commonly known as:  LASIX Take 1.5 tablets (60 mg total) by mouth 2 (two) times daily.   glucose blood test strip Test blood glucose three times daily.   ibandronate 150 MG tablet Commonly known as:  BONIVA Take 150 mg by mouth every 30 (thirty) days. Take in the morning with a full glass of water, on an empty stomach, and do not take anything else by mouth or lie  down for the next 30 min.   isosorbide mononitrate 60 MG 24 hr tablet Commonly known as:  IMDUR TAKE 1 TABLET BY MOUTH 2 TIMES DAILY.   levothyroxine 100 MCG tablet Commonly known as:  SYNTHROID, LEVOTHROID Take 100 mcg by mouth daily before breakfast.   metFORMIN 500 MG 24 hr tablet Commonly known as:  GLUCOPHAGE-XR Take 500 mg by mouth 2 (two) times daily.   NITROSTAT 0.4 MG SL tablet Generic drug:  nitroGLYCERIN PLACE 1 TABLET (0.4 MG TOTAL) UNDER THE TONGUE EVERY 5 (FIVE) MINUTES AS NEEDED FOR CHEST PAIN.   pantoprazole 40 MG tablet Commonly known as:  PROTONIX Take 1 tablet (40 mg total) by mouth daily.   phenytoin 100 MG ER capsule Commonly known as:  DILANTIN Take 100-200 mg by mouth 2 (two) times daily. 200 mg in the morning and 100 mg in the evening   potassium chloride SA 20 MEQ tablet Commonly known as:  K-DUR,KLOR-CON TAKE 1 TABLET BY MOUTH 2 TIMES DAILY.   rosuvastatin 40 MG tablet Commonly known as:  CRESTOR TAKE 1 TABLET BY MOUTH DAILY   timolol 0.5 % ophthalmic solution Commonly known as:  TIMOPTIC Place 1 drop into both eyes daily.   warfarin 5 MG tablet Commonly known as:  COUMADIN Take as directed. If you are unsure how to take this medication, talk to your nurse or doctor. Original instructions:  TAKE AS DIRECTED BY COUMADIN CLINIC What changed:  See the new instructions.        Acute coronary syndrome (MI, NSTEMI, STEMI, etc) this admission?: No.    Outstanding Labs/Studies   Pt will need INR checked on Monday 03/27/18.    Duration of Discharge Encounter   Greater than 30 minutes  including physician time.  Signed, [place], NP 03/23/2018, 11:11 AM

## 2018-03-21 NOTE — Progress Notes (Addendum)
Progress Note  Patient Name: Christine Gay Date of Encounter: 03/21/2018  Primary Cardiologist: [name] in 2016 Primary electrophysiologist: Dr. Allegra Lai  Subjective   Pt is seen in stress lab. She denies any chest pain or shortness of breath.  Inpatient Medications    Scheduled Meds: . aspirin EC  81 mg Oral Daily  . carvedilol  6.25 mg Oral BID  . cycloSPORINE  1 drop Both Eyes BID  . enoxaparin (LOVENOX) injection  40 mg Subcutaneous Q24H  . ezetimibe  10 mg Oral Daily  . ferrous sulfate  325 mg Oral BID WC  . furosemide  60 mg Oral BID  . insulin aspart  0-15 Units Subcutaneous TID WC  . isosorbide mononitrate  60 mg Oral BID  . levothyroxine  100 mcg Oral QAC breakfast  . pantoprazole  40 mg Oral Daily  . phenytoin  100 mg Oral QHS  . phenytoin  200 mg Oral Daily  . regadenoson      . rosuvastatin  40 mg Oral Daily  . timolol  1 drop Both Eyes Daily   Continuous Infusions:  PRN Meds: acetaminophen, nitroGLYCERIN, ondansetron (ZOFRAN) IV   Vital Signs    Vitals:   03/20/18 1550 03/20/18 2116 03/20/18 2200 03/21/18 0628  BP: (!) 104/58 102/67 (!) 118/91 (!) 99/56  Pulse: (!) 59 60  60  Resp:  18 13   Temp: 98.6 F (37 C) 98.6 F (37 C)  98 F (36.7 C)  TempSrc: Oral Oral  Oral  SpO2: 94% 96%  98%  Weight:    157 lb 4.8 oz (71.4 kg)  Height:        Intake/Output Summary (Last 24 hours) at 03/21/2018 1031 Last data filed at 03/21/2018 0629 Gross per 24 hour  Intake 360 ml  Output 600 ml  Net -240 ml   Filed Weights   03/18/18 2042 03/19/18 0448 03/21/18 0628  Weight: 157 lb 4.8 oz (71.4 kg) 158 lb 12.8 oz (72 kg) 157 lb 4.8 oz (71.4 kg)    Telemetry    Sinus rhythm with occ PVC - Personally Reviewed  ECG    No new tracings - Personally Reviewed  Physical Exam   GEN: No acute distress.   Neck: No JVD Cardiac: RRR, no murmurs, rubs, or gallops.  Respiratory: Clear to auscultation bilaterally. GI: Soft, nontender,  non-distended  MS: No edema; No deformity. Neuro:  Nonfocal  Psych: Normal affect   Labs    Chemistry Recent Labs  Lab 03/19/18 0430 03/20/18 0512 03/21/18 0538  NA 140 142 140  K 3.7 3.4* 3.9  CL 107 107 107  CO2 25 27 27   GLUCOSE 70 97 84  BUN 12 10 13   CREATININE 0.78 0.80 0.92  CALCIUM 7.7* 7.7* 7.8*  GFRNONAA >60 >60 59*  GFRAA >60 >60 >60  ANIONGAP 8 8 6      Hematology Recent Labs  Lab 03/19/18 0430 03/20/18 0512 03/21/18 0538  WBC 5.4 4.5 4.4  RBC 3.13* 3.23* 3.24*  HGB 10.2* 10.4* 10.5*  HCT 30.5* 31.7* 32.3*  MCV 97.4 98.1 99.7  MCH 32.6 32.2 32.4  MCHC 33.4 32.8 32.5  RDW 11.9 11.9 11.9  PLT 136* 147* 157    Cardiac Enzymes Recent Labs  Lab 03/18/18 2224 03/19/18 0430 03/19/18 1041  TROPONINI <0.03 <0.03 <0.03    Recent Labs  Lab 03/18/18 1531  TROPIPOC 0.09*     BNP Recent Labs  Lab 03/18/18 2224  BNP 232.5*  DDimer No results for input(s): DDIMER in the last 168 hours.   Radiology    No results found.  Cardiac Studies   Echocardiogram 03/20/18 Study Conclusions  - Left ventricle: The cavity size was normal. There was moderate   concentric hypertrophy. Systolic function was at the lower limits   of normal. The estimated ejection fraction was in the range of   50% to 55%. Hypokinesis of the basalinferolateral myocardium.   Doppler parameters are consistent with abnormal left ventricular   relaxation (grade 1 diastolic dysfunction). Indeterminate mean   left atrial filling pressure. - Ventricular septum: Septal motion showed abnormal function,   dyssynergy, and paradox. These changes are consistent with right   ventricular pacing. - Aortic valve: There was mild regurgitation. - Mitral valve: There was mild regurgitation.  Patient Profile     77 y.o. female with past medical history of coronary artery disease status post coronary artery bypass graft 1992 admitted with chest pain similar to previous angina by her  report.  Enzymes negative.  INR 232.  Assessment & Plan    Chest pain- No current chest pain, had some dypsnea but now denies any since yesterday. Lexiscan myoview in process for risk stratification. If normal or low risk would plan medical therapy.  Otherwise would require cardiac catheterization.  Chronic diastolic congestive heart failure-patient is euvolemic.  Continue present dose of Lasix.  Hypertension-blood pressure is controlled.  Continue present medications.  Coronary artery disease status post coronary artery bypass graft 1992-continue aspirin and statin.  History of paroxysmal atrial fibrillation-patient is V pacing with underlying sinus rhythm. Coumadin on hold if cardiac catheterization necessary.  INR was 1.2 on 6/21. We will resume at discharge.   For questions or updates, please contact [place] Please consult [email or web address] for contact info under Cardiology/STEMI.      Signed, Daune Perch, NP  03/21/2018, 10:31 AM   As above, patient seen and examined.  She has not had recurrent chest pain but did have shoulder pain and headache with Lexiscan.  She denies dyspnea.  We will await results of nuclear study.  If normal or low risk would favor medical therapy.  If high risk would require cardiac catheterization.  Continue present medications.  Resume Coumadin at discharge. Kirk Ruths, MD

## 2018-03-21 NOTE — Progress Notes (Signed)
    Lexiscan myoview showed Large size moderate severity anterior wall and anterior apical region of inducible ischemia, up to 21% loss of activity. Inferolateral wall mild inducible ischemia and underlying scarring a moderate-sized area of in the mid heart and basilar region. Moderate end- diastolic left ventricular enlargement and considerable end-systolic left ventricular enlargement. EF 44%  This is a high risk study. I have discussed the results with the patient and her daughter, Gaspar Skeeters. Cardiac cath is recommended to definitively evaluate the coronary arteries and grafts. The patient understands that risks included but are not limited to stroke (1 in 1000), death (1 in 30), kidney failure [usually temporary] (1 in 500), bleeding (1 in 200), allergic reaction [possibly serious] (1 in 200).   She wishes to proceed with the cath. She is hopeful that her wrist can be used for the procedure.   Orders have been placed and pt is scheduled for tomorrow afternoon.   Daune Perch, AGNP-C [facility] 03/21/2018  3:18 PM Pager: [phone]

## 2018-03-22 ENCOUNTER — Encounter (HOSPITAL_COMMUNITY)
Admission: EM | Disposition: A | Discharge status: Home / Self Care | Payer: Self-pay | Source: Home / Self Care | Attending: Cardiology

## 2018-03-22 DIAGNOSIS — R931 Abnormal findings on diagnostic imaging of heart and coronary circulation: Secondary | ICD-10-CM

## 2018-03-22 DIAGNOSIS — I2510 Atherosclerotic heart disease of native coronary artery without angina pectoris: Secondary | ICD-10-CM

## 2018-03-22 HISTORY — PX: LEFT HEART CATH AND CORS/GRAFTS ANGIOGRAPHY: CATH118250

## 2018-03-22 LAB — CBC
HCT: 32.2 % — ABNORMAL LOW (ref 36.0–46.0)
Hemoglobin: 10.5 g/dL — ABNORMAL LOW (ref 12.0–15.0)
MCH: 32.2 pg (ref 26.0–34.0)
MCHC: 32.6 g/dL (ref 30.0–36.0)
MCV: 98.8 fL (ref 78.0–100.0)
Platelets: 160 10*3/uL (ref 150–400)
RBC: 3.26 MIL/uL — AB (ref 3.87–5.11)
RDW: 11.9 % (ref 11.5–15.5)
WBC: 4.9 10*3/uL (ref 4.0–10.5)

## 2018-03-22 LAB — BASIC METABOLIC PANEL
ANION GAP: 7 (ref 5–15)
BUN: 12 mg/dL (ref 8–23)
CO2: 27 mmol/L (ref 22–32)
Calcium: 7.8 mg/dL — ABNORMAL LOW (ref 8.9–10.3)
Chloride: 106 mmol/L (ref 98–111)
Creatinine, Ser: 0.88 mg/dL (ref 0.44–1.00)
GFR calc Af Amer: >60 mL/min (ref >60)
Glucose, Bld: 102 mg/dL — ABNORMAL HIGH (ref 70–99)
POTASSIUM: 3.5 mmol/L (ref 3.5–5.1)
SODIUM: 140 mmol/L (ref 135–145)

## 2018-03-22 LAB — GLUCOSE, CAPILLARY
GLUCOSE-CAPILLARY: 107 mg/dL — AB (ref 70–99)
GLUCOSE-CAPILLARY: 89 mg/dL (ref 70–99)
Glucose-Capillary: 70 mg/dL (ref 70–99)
Glucose-Capillary: 126 mg/dL — ABNORMAL HIGH (ref 70–99)

## 2018-03-22 LAB — PROTIME-INR
INR: 1.22
Prothrombin Time: 15.3 seconds — ABNORMAL HIGH (ref 11.4–15.2)

## 2018-03-22 SURGERY — LEFT HEART CATH AND CORS/GRAFTS ANGIOGRAPHY
Anesthesia: LOCAL

## 2018-03-22 MED ORDER — FENTANYL CITRATE (PF) 100 MCG/2ML IJ SOLN
INTRAMUSCULAR | Status: DC | PRN
  Administered 2018-03-22 (×2): 25 ug via INTRAVENOUS

## 2018-03-22 MED ORDER — VERAPAMIL HCL 2.5 MG/ML IV SOLN
INTRAVENOUS | Status: DC | PRN
  Administered 2018-03-22: 10 mL via INTRA_ARTERIAL

## 2018-03-22 MED ORDER — IOPAMIDOL (ISOVUE-370) INJECTION 76%
INTRAVENOUS | Status: AC
  Filled 2018-03-22: qty 100

## 2018-03-22 MED ORDER — VERAPAMIL HCL 2.5 MG/ML IV SOLN
INTRAVENOUS | Status: AC
  Filled 2018-03-22: qty 2

## 2018-03-22 MED ORDER — HEPARIN (PORCINE) IN NACL 2-0.9 UNITS/ML
INTRAMUSCULAR | Status: AC | PRN
  Administered 2018-03-22: 500 mL

## 2018-03-22 MED ORDER — MIDAZOLAM HCL 2 MG/2ML IJ SOLN
INTRAMUSCULAR | Status: AC
  Filled 2018-03-22: qty 2

## 2018-03-22 MED ORDER — HEPARIN (PORCINE) IN NACL 1000-0.9 UT/500ML-% IV SOLN
INTRAVENOUS | Status: AC
  Filled 2018-03-22: qty 1000

## 2018-03-22 MED ORDER — SODIUM CHLORIDE 0.9% FLUSH
3.0000 mL | Freq: Two times a day (BID) | INTRAVENOUS | Status: DC
  Administered 2018-03-22: 3 mL via INTRAVENOUS

## 2018-03-22 MED ORDER — FENTANYL CITRATE (PF) 100 MCG/2ML IJ SOLN
INTRAMUSCULAR | Status: AC
  Filled 2018-03-22: qty 2

## 2018-03-22 MED ORDER — SODIUM CHLORIDE 0.9% FLUSH: 3.0000 mL | INTRAVENOUS | Status: DC | PRN

## 2018-03-22 MED ORDER — MIDAZOLAM HCL 2 MG/2ML IJ SOLN
INTRAMUSCULAR | Status: DC | PRN
  Administered 2018-03-22 (×2): 1 mg via INTRAVENOUS

## 2018-03-22 MED ORDER — HEPARIN SODIUM (PORCINE) 1000 UNIT/ML IJ SOLN
INTRAMUSCULAR | Status: DC | PRN
  Administered 2018-03-22: 4000 [IU] via INTRAVENOUS

## 2018-03-22 MED ORDER — LIDOCAINE HCL (PF) 1 % IJ SOLN
INTRAMUSCULAR | Status: AC
  Filled 2018-03-22: qty 30

## 2018-03-22 MED ORDER — LIDOCAINE HCL (PF) 1 % IJ SOLN
INTRAMUSCULAR | Status: DC | PRN
  Administered 2018-03-22: 2 mL

## 2018-03-22 MED ORDER — SODIUM CHLORIDE 0.9 % IV SOLN: 250.0000 mL | INTRAVENOUS | Status: DC | PRN

## 2018-03-22 MED ORDER — SODIUM CHLORIDE 0.9 % WEIGHT BASED INFUSION: 1.0000 mL/kg/h | INTRAVENOUS | Status: AC

## 2018-03-22 MED ORDER — HEPARIN SODIUM (PORCINE) 1000 UNIT/ML IJ SOLN
INTRAMUSCULAR | Status: AC
  Filled 2018-03-22: qty 1

## 2018-03-22 MED ORDER — IOPAMIDOL (ISOVUE-370) INJECTION 76%
INTRAVENOUS | Status: DC | PRN
  Administered 2018-03-22: 50 mL via INTRA_ARTERIAL

## 2018-03-22 SURGICAL SUPPLY — 11 items
CATH INFINITI 5FR AL1 (CATHETERS) ×1 IMPLANT
CATH INFINITI 5FR MULTPACK ANG (CATHETERS) ×1 IMPLANT
DEVICE RAD COMP TR BAND LRG (VASCULAR PRODUCTS) ×1 IMPLANT
GLIDESHEATH SLEND SS 6F .021 (SHEATH) ×1 IMPLANT
GUIDEWIRE INQWIRE 1.5J.035X260 (WIRE) IMPLANT
INQWIRE 1.5J .035X260CM (WIRE) ×2
KIT HEART LEFT (KITS) ×2 IMPLANT
PACK CARDIAC CATHETERIZATION (CUSTOM PROCEDURE TRAY) ×2 IMPLANT
PAD ELECT DEFIB RADIOL ZOLL (MISCELLANEOUS) ×1 IMPLANT
TRANSDUCER W/STOPCOCK (MISCELLANEOUS) ×2 IMPLANT
TUBING CIL FLEX 10 FLL-RA (TUBING) ×2 IMPLANT

## 2018-03-22 NOTE — Interval H&P Note (Signed)
Cath Lab Visit (complete for each Cath Lab visit)  Clinical Evaluation Leading to the Procedure:   ACS: No.  Non-ACS:    Anginal Classification: CCS II  Anti-ischemic medical therapy: Maximal Therapy (2 or more classes of medications)  Non-Invasive Test Results: High-risk stress test findings: cardiac mortality >3%/year  Prior CABG: Previous CABG      History and Physical Interval Note:  03/22/2018 3:37 PM  Christine Gay  has presented today for surgery, with the diagnosis of cp - high risk stress test  The various methods of treatment have been discussed with the patient and family. After consideration of risks, benefits and other options for treatment, the patient has consented to  Procedure(s): LEFT HEART CATH AND CORS/GRAFTS ANGIOGRAPHY (N/A) as a surgical intervention .  The patient's history has been reviewed, patient examined, no change in status, stable for surgery.  I have reviewed the patient's chart and labs.  Questions were answered to the patient's satisfaction.     Sherren Mocha

## 2018-03-22 NOTE — Plan of Care (Signed)
  Problem: Activity: Goal: Risk for activity intolerance will decrease Outcome: Progressing   Problem: Nutrition: Goal: Adequate nutrition will be maintained Outcome: Completed/Met   Problem: Pain Managment: Goal: General experience of comfort will improve Outcome: Completed/Met   Problem: Education: Goal: Understanding of CV disease, CV risk reduction, and recovery process will improve Outcome: Completed/Met

## 2018-03-22 NOTE — Progress Notes (Signed)
Progress Note  Patient Name: Christine Gay Date of Encounter: 03/22/2018  Primary Cardiologist: Dr Aundra Dubin  Subjective   Pt denies CP or dyspnea  Inpatient Medications    Scheduled Meds: . aspirin  81 mg Oral Pre-Cath  . aspirin EC  81 mg Oral Daily  . carvedilol  6.25 mg Oral BID  . cycloSPORINE  1 drop Both Eyes BID  . enoxaparin (LOVENOX) injection  40 mg Subcutaneous Q24H  . ezetimibe  10 mg Oral Daily  . ferrous sulfate  325 mg Oral BID WC  . furosemide  60 mg Oral BID  . insulin aspart  0-15 Units Subcutaneous TID WC  . isosorbide mononitrate  60 mg Oral BID  . levothyroxine  100 mcg Oral QAC breakfast  . pantoprazole  40 mg Oral Daily  . phenytoin  100 mg Oral QHS  . phenytoin  200 mg Oral Daily  . rosuvastatin  40 mg Oral Daily  . sodium chloride flush  3 mL Intravenous Q12H  . timolol  1 drop Both Eyes Daily   Continuous Infusions: . sodium chloride    . sodium chloride 1 mL/kg/hr (03/22/18 0748)   PRN Meds: sodium chloride, acetaminophen, nitroGLYCERIN, ondansetron (ZOFRAN) IV, sodium chloride flush   Vital Signs    Vitals:   03/21/18 1428 03/21/18 1555 03/21/18 2027 03/22/18 0500  BP: (!) 89/56 98/60 115/65 126/68  Pulse: 60 60 60 64  Resp:  18 18 18   Temp: 98 F (36.7 C) 99.3 F (37.4 C) 98.7 F (37.1 C) 98.4 F (36.9 C)  TempSrc: Oral Oral Oral Oral  SpO2: 99% 100% 96% 100%  Weight:    156 lb 14.4 oz (71.2 kg)  Height:        Intake/Output Summary (Last 24 hours) at 03/22/2018 0833 Last data filed at 03/22/2018 0500 Gross per 24 hour  Intake 720 ml  Output 1300 ml  Net -580 ml   Filed Weights   03/19/18 0448 03/21/18 0628 03/22/18 0500  Weight: 158 lb 12.8 oz (72 kg) 157 lb 4.8 oz (71.4 kg) 156 lb 14.4 oz (71.2 kg)    Telemetry    Sinus- Personally Reviewed  Physical Exam   GEN: WD WN No acute distress.   Neck: No JVD, supple Cardiac: RRR Respiratory: Clear to auscultation bilaterally; no wheeze GI: Soft, nontender,  non-distended, no masses MS: No edema Neuro:  Grossly intact  Labs    Chemistry Recent Labs  Lab 03/20/18 0512 03/21/18 0538 03/22/18 0523  NA 142 140 140  K 3.4* 3.9 3.5  CL 107 107 106  CO2 27 27 27   GLUCOSE 97 84 102*  BUN 10 13 12   CREATININE 0.80 0.92 0.88  CALCIUM 7.7* 7.8* 7.8*  GFRNONAA >60 59* >60  GFRAA >60 >60 >60  ANIONGAP 8 6 7      Hematology Recent Labs  Lab 03/20/18 0512 03/21/18 0538 03/22/18 0523  WBC 4.5 4.4 4.9  RBC 3.23* 3.24* 3.26*  HGB 10.4* 10.5* 10.5*  HCT 31.7* 32.3* 32.2*  MCV 98.1 99.7 98.8  MCH 32.2 32.4 32.2  MCHC 32.8 32.5 32.6  RDW 11.9 11.9 11.9  PLT 147* 157 160    Cardiac Enzymes Recent Labs  Lab 03/18/18 2224 03/19/18 0430 03/19/18 1041  TROPONINI <0.03 <0.03 <0.03    Recent Labs  Lab 03/18/18 1531  TROPIPOC 0.09*     BNP Recent Labs  Lab 03/18/18 2224  BNP 232.5*     Radiology    Nm Myocar  Multi W/spect W/wall Motion / Ef  Result Date: 03/21/2018 CLINICAL DATA:  Chest pain and prior myocardial infarction. Abnormal EKG. Prior stent and CABG. Hypertension. Coronary artery disease. Lower extremity edema. EXAM: MYOCARDIAL IMAGING WITH SPECT (REST AND PHARMACOLOGIC-STRESS) GATED LEFT VENTRICULAR WALL MOTION STUDY LEFT VENTRICULAR EJECTION FRACTION TECHNIQUE: Standard myocardial SPECT imaging was performed after resting intravenous injection of 10 mCi Tc-74m tetrofosmin. Subsequently, intravenous infusion of Lexiscan was performed under the supervision of the Cardiology staff. At peak effect of the drug, 30 mCi Tc-4m tetrofosmin was injected intravenously and standard myocardial SPECT imaging was performed. Quantitative gated imaging was also performed to evaluate left ventricular wall motion, and estimate left ventricular ejection fraction. COMPARISON:  None. FINDINGS: Perfusion: Apical scarring or thinning. Large size moderate severity reduction in anterior wall and anterior apical activity, up to 21% loss of activity  on stress images as compared to rest images, compatible with inducible ischemia. In addition, in the mid heart and basilar inferolateral wall there is a moderate size mild severity focus of inducible ischemia and mild underlying scarring. Wall Motion: Lateral and apical hypokinesis. Poor wall thickening in the apex, inferior wall, and lateral wall. Left Ventricular Ejection Fraction: 44 % End diastolic volume 923 ml (moderately abnormally elevated) End systolic volume 71 ml (considerably abnormally elevated) IMPRESSION: 1. Large size moderate severity anterior wall and anterior apical region of inducible ischemia, up to 21% loss of activity. Inferolateral wall mild inducible ischemia and underlying scarring a moderate-sized area of in the mid heart and basilar region. Moderate end- diastolic left ventricular enlargement and considerable end- systolic left ventricular enlargement. 2. Hypokinesis and poor wall thickening is noted above. 3. Left ventricular ejection fraction 44% 4. Non invasive risk stratification*: High *2012 Appropriate Use Criteria for Coronary Revascularization Focused Update: J Am Coll Cardiol. 3007;62(2):633-354. http://content.[email or web address].aspx?articleid=[email or web address] Electronically Signed   By: Van Clines M.D.   On: 03/21/2018 12:51    Patient Profile     77 y.o. female with past medical history of coronary artery disease status post coronary artery bypass and graft admitted with chest pain similar to previous angina by her report.  Enzymes negative.    Assessment & Plan    1 Chest pain-nuclear study with significant ischemia.  Plan cardiac catheterization.  The risks and benefits including myocardial infarction, CVA and death discussed and she agrees to proceed.  2 chronic diastolic congestive heart failure-patient is euvolemic.  I will hold Lasix today in anticipation of cardiac catheterization.  Resume tomorrow if renal function stable.    3 hypertension-blood  pressure is controlled.  Continue present medications.  4 coronary artery disease status post coronary artery bypass graft-continue aspirin and statin.  5 history of paroxysmal atrial fibrillation-Patient remains in sinus rhythm.  Continue carvedilol for rate control if atrial fibrillation recurs. We will resume Coumadin following cardiac catheterization.  For questions or updates, please contact [place] Please consult [email or web address] for contact info under Cardiology/STEMI.      Signed, Kirk Ruths, MD  03/22/2018, 8:33 AM

## 2018-03-22 NOTE — Consult Note (Signed)
[facility] CM Primary Care Navigator  03/22/2018  Christine Gay 31-Dec-1940 [id number]   Met with patient and daughter Gaspar Skeeters) at the bedside to identify possible discharge needs. Patient reportshaving "chest pressure/ chest pain radiating under left breast" that had led to this admission. (NSTEMI- non-ST elevated myocardial infarction, for cardiac cath today)  Patient endorses Dr.Ronald Polite with [facility] provider.   [facility] [address] [facility]y.   Patientmentioned that she manages hermedications at home with daughter's assistance, straight out of the containers.  Patient verbalized thatshe uses public transportation [employer] transport) to her doctors'appointments.  Made aware of [employer] transportation benefits.  Patientstates thatdaughter (med tech) lives with her, who serves as her primary caregiver at home.  Anticipated plan for discharge is home when ready per patient.   Patientand daughter expressedunderstanding to call primary care provider'sofficeafterdischargeto schedulea posthospitalfollow-up visitwithin 1- 2 weeksor sooner if needs arise.Patient letter(with PCP's contact number) wasgiven as a reminder.  Per MD note, patienthas history of coronary artery disease with prior CABG- coronary artery bypass graft (1992), complete heart block status post permanent pacemaker, HF, Atrial fibrillation, HTN, HLD, DM and, hypothyroidism. Echo on 03/20/18 with EF 50- 55%, continues on Lasix.  Sparkman scaleat home but only checking weight and recording once a week. Daughter reports that patient usually has lower extremity swelling at home. Patienthas minimal awareness of thesigns and symptomsofHFthatwill need medical assistance, HF zones/ tool and ways to manage heart  failure including diet restrictions. Her daughter is assisting patient in managing her health needs and is awareofreportable weight gains, but states that patient will need to be reminded and reinforced on ways to manage chronic health issues specifically on diet due to non-adherence in relation to HF and DM ("prefers to eat ice cream rather than sherbet, drinks soda instead of fresh fruit juice, still using salt/ salt shaker").  Explained to patientand daughter regardingTHN care management services/ resourcesavailable for herand was interested ofit.Patient verbally agreed and [facility] for further disease education and management of health conditions (HF/ DM)after discharge.  Referral made to [facility] for further disease education and management of health conditions (HF/ DM)at home.  THNcare managementinformation provided for future needs thatshe may have.   For additional questions please contact:  Edwena Felty A. Raquan Iannone, BSN, RN-BC [facility] PRIMARY CARE Navigator Cell: [phone]

## 2018-03-22 NOTE — H&P (View-Only) (Signed)
Progress Note  Patient Name: Christine Gay Date of Encounter: 03/22/2018  Primary Cardiologist: Dr Aundra Dubin  Subjective   Pt denies CP or dyspnea  Inpatient Medications    Scheduled Meds: . aspirin  81 mg Oral Pre-Cath  . aspirin EC  81 mg Oral Daily  . carvedilol  6.25 mg Oral BID  . cycloSPORINE  1 drop Both Eyes BID  . enoxaparin (LOVENOX) injection  40 mg Subcutaneous Q24H  . ezetimibe  10 mg Oral Daily  . ferrous sulfate  325 mg Oral BID WC  . furosemide  60 mg Oral BID  . insulin aspart  0-15 Units Subcutaneous TID WC  . isosorbide mononitrate  60 mg Oral BID  . levothyroxine  100 mcg Oral QAC breakfast  . pantoprazole  40 mg Oral Daily  . phenytoin  100 mg Oral QHS  . phenytoin  200 mg Oral Daily  . rosuvastatin  40 mg Oral Daily  . sodium chloride flush  3 mL Intravenous Q12H  . timolol  1 drop Both Eyes Daily   Continuous Infusions: . sodium chloride    . sodium chloride 1 mL/kg/hr (03/22/18 0748)   PRN Meds: sodium chloride, acetaminophen, nitroGLYCERIN, ondansetron (ZOFRAN) IV, sodium chloride flush   Vital Signs    Vitals:   03/21/18 1428 03/21/18 1555 03/21/18 2027 03/22/18 0500  BP: (!) 89/56 98/60 115/65 126/68  Pulse: 60 60 60 64  Resp:  18 18 18   Temp: 98 F (36.7 C) 99.3 F (37.4 C) 98.7 F (37.1 C) 98.4 F (36.9 C)  TempSrc: Oral Oral Oral Oral  SpO2: 99% 100% 96% 100%  Weight:    156 lb 14.4 oz (71.2 kg)  Height:        Intake/Output Summary (Last 24 hours) at 03/22/2018 0833 Last data filed at 03/22/2018 0500 Gross per 24 hour  Intake 720 ml  Output 1300 ml  Net -580 ml   Filed Weights   03/19/18 0448 03/21/18 0628 03/22/18 0500  Weight: 158 lb 12.8 oz (72 kg) 157 lb 4.8 oz (71.4 kg) 156 lb 14.4 oz (71.2 kg)    Telemetry    Sinus- Personally Reviewed  Physical Exam   GEN: WD WN No acute distress.   Neck: No JVD, supple Cardiac: RRR Respiratory: Clear to auscultation bilaterally; no wheeze GI: Soft, nontender,  non-distended, no masses MS: No edema Neuro:  Grossly intact  Labs    Chemistry Recent Labs  Lab 03/20/18 0512 03/21/18 0538 03/22/18 0523  NA 142 140 140  K 3.4* 3.9 3.5  CL 107 107 106  CO2 27 27 27   GLUCOSE 97 84 102*  BUN 10 13 12   CREATININE 0.80 0.92 0.88  CALCIUM 7.7* 7.8* 7.8*  GFRNONAA >60 59* >60  GFRAA >60 >60 >60  ANIONGAP 8 6 7      Hematology Recent Labs  Lab 03/20/18 0512 03/21/18 0538 03/22/18 0523  WBC 4.5 4.4 4.9  RBC 3.23* 3.24* 3.26*  HGB 10.4* 10.5* 10.5*  HCT 31.7* 32.3* 32.2*  MCV 98.1 99.7 98.8  MCH 32.2 32.4 32.2  MCHC 32.8 32.5 32.6  RDW 11.9 11.9 11.9  PLT 147* 157 160    Cardiac Enzymes Recent Labs  Lab 03/18/18 2224 03/19/18 0430 03/19/18 1041  TROPONINI <0.03 <0.03 <0.03    Recent Labs  Lab 03/18/18 1531  TROPIPOC 0.09*     BNP Recent Labs  Lab 03/18/18 2224  BNP 232.5*     Radiology    Nm Myocar  Multi W/spect W/wall Motion / Ef  Result Date: 03/21/2018 CLINICAL DATA:  Chest pain and prior myocardial infarction. Abnormal EKG. Prior stent and CABG. Hypertension. Coronary artery disease. Lower extremity edema. EXAM: MYOCARDIAL IMAGING WITH SPECT (REST AND PHARMACOLOGIC-STRESS) GATED LEFT VENTRICULAR WALL MOTION STUDY LEFT VENTRICULAR EJECTION FRACTION TECHNIQUE: Standard myocardial SPECT imaging was performed after resting intravenous injection of 10 mCi Tc-44m tetrofosmin. Subsequently, intravenous infusion of Lexiscan was performed under the supervision of the Cardiology staff. At peak effect of the drug, 30 mCi Tc-32m tetrofosmin was injected intravenously and standard myocardial SPECT imaging was performed. Quantitative gated imaging was also performed to evaluate left ventricular wall motion, and estimate left ventricular ejection fraction. COMPARISON:  None. FINDINGS: Perfusion: Apical scarring or thinning. Large size moderate severity reduction in anterior wall and anterior apical activity, up to 21% loss of activity  on stress images as compared to rest images, compatible with inducible ischemia. In addition, in the mid heart and basilar inferolateral wall there is a moderate size mild severity focus of inducible ischemia and mild underlying scarring. Wall Motion: Lateral and apical hypokinesis. Poor wall thickening in the apex, inferior wall, and lateral wall. Left Ventricular Ejection Fraction: 44 % End diastolic volume 836 ml (moderately abnormally elevated) End systolic volume 71 ml (considerably abnormally elevated) IMPRESSION: 1. Large size moderate severity anterior wall and anterior apical region of inducible ischemia, up to 21% loss of activity. Inferolateral wall mild inducible ischemia and underlying scarring a moderate-sized area of in the mid heart and basilar region. Moderate end- diastolic left ventricular enlargement and considerable end- systolic left ventricular enlargement. 2. Hypokinesis and poor wall thickening is noted above. 3. Left ventricular ejection fraction 44% 4. Non invasive risk stratification*: High *2012 Appropriate Use Criteria for Coronary Revascularization Focused Update: J Am Coll Cardiol. 6294;76(5):465-035. http://content.[email or web address]?[email or web address] Electronically Signed   By: Van Clines M.D.   On: 03/21/2018 12:51    Patient Profile     77 y.o. female with past medical history of coronary artery disease status post coronary artery bypass and graft admitted with chest pain similar to previous angina by her report.  Enzymes negative.    Assessment & Plan    1 Chest pain-nuclear study with significant ischemia.  Plan cardiac catheterization.  The risks and benefits including myocardial infarction, CVA and death discussed and she agrees to proceed.  2 chronic diastolic congestive heart failure-patient is euvolemic.  I will hold Lasix today in anticipation of cardiac catheterization.  Resume tomorrow if renal function stable.    3 hypertension-blood  pressure is controlled.  Continue present medications.  4 coronary artery disease status post coronary artery bypass graft-continue aspirin and statin.  5 history of paroxysmal atrial fibrillation-Patient remains in sinus rhythm.  Continue carvedilol for rate control if atrial fibrillation recurs. We will resume Coumadin following cardiac catheterization.  For questions or updates, please contact [place] Please consult [email or web address] for contact info under Cardiology/STEMI.      Signed, Kirk Ruths, MD  03/22/2018, 8:33 AM

## 2018-03-23 ENCOUNTER — Encounter (HOSPITAL_COMMUNITY): Payer: Self-pay | Admitting: Cardiovascular Disease

## 2018-03-23 DIAGNOSIS — Z7901 Long term (current) use of anticoagulants: Secondary | ICD-10-CM

## 2018-03-23 DIAGNOSIS — Z950 Presence of cardiac pacemaker: Secondary | ICD-10-CM

## 2018-03-23 LAB — CBC
HCT: 30.9 % — ABNORMAL LOW (ref 36.0–46.0)
Hemoglobin: 10.2 g/dL — ABNORMAL LOW (ref 12.0–15.0)
MCH: 32.2 pg (ref 26.0–34.0)
MCHC: 33 g/dL (ref 30.0–36.0)
MCV: 97.5 fL (ref 78.0–100.0)
PLATELETS: 161 10*3/uL (ref 150–400)
RBC: 3.17 MIL/uL — ABNORMAL LOW (ref 3.87–5.11)
RDW: 11.8 % (ref 11.5–15.5)
WBC: 4.8 10*3/uL (ref 4.0–10.5)

## 2018-03-23 LAB — BASIC METABOLIC PANEL
Anion gap: 5 (ref 5–15)
BUN: 12 mg/dL (ref 8–23)
CALCIUM: 7.6 mg/dL — AB (ref 8.9–10.3)
CO2: 26 mmol/L (ref 22–32)
Chloride: 107 mmol/L (ref 98–111)
Creatinine, Ser: 0.88 mg/dL (ref 0.44–1.00)
GFR calc Af Amer: >60 mL/min (ref >60)
GLUCOSE: 92 mg/dL (ref 70–99)
Potassium: 3.7 mmol/L (ref 3.5–5.1)
SODIUM: 138 mmol/L (ref 135–145)

## 2018-03-23 LAB — GLUCOSE, CAPILLARY
Glucose-Capillary: 98 mg/dL (ref 70–99)
Glucose-Capillary: 111 mg/dL — ABNORMAL HIGH (ref 70–99)

## 2018-03-23 MED ORDER — ASPIRIN 81 MG PO TBEC: 81.0000 mg | DELAYED_RELEASE_TABLET | Freq: Every day | ORAL | Status: DC

## 2018-03-23 NOTE — Care Management Note (Signed)
Case Management Note  Patient Details  Name: Christine Gay MRN: [id number] Date of Birth: [birth year]  Subjective/Objective: Pt initially presented for Chest Pain-stress test high risk. Post cardiac cath.                    Action/Plan: No Home needs identified at this time.   Expected Discharge Date:  03/23/18               Expected Discharge Plan:  Home/Self Care  In-House Referral:  NA  Discharge planning Services  NA  Post Acute Care Choice:  NA Choice offered to:  NA  DME Arranged:  N/A DME Agency:  NA  HH Arranged:  NA HH Agency:  NA  Status of Service:  Completed, signed off  If discussed at [place] of Stay Meetings, dates discussed:    Additional Comments:  Bethena Roys, RN 03/23/2018, 12:17 PM

## 2018-03-23 NOTE — Progress Notes (Signed)
Progress Note  Patient Name: Christine Gay Date of Encounter: 03/23/2018  Primary Cardiologist: Dr Aundra Dubin  Subjective   No chest pain or dyspnea  Inpatient Medications    Scheduled Meds: . aspirin EC  81 mg Oral Daily  . carvedilol  6.25 mg Oral BID  . cycloSPORINE  1 drop Both Eyes BID  . enoxaparin (LOVENOX) injection  40 mg Subcutaneous Q24H  . ezetimibe  10 mg Oral Daily  . ferrous sulfate  325 mg Oral BID WC  . insulin aspart  0-15 Units Subcutaneous TID WC  . isosorbide mononitrate  60 mg Oral BID  . levothyroxine  100 mcg Oral QAC breakfast  . pantoprazole  40 mg Oral Daily  . phenytoin  100 mg Oral QHS  . phenytoin  200 mg Oral Daily  . rosuvastatin  40 mg Oral Daily  . sodium chloride flush  3 mL Intravenous Q12H  . timolol  1 drop Both Eyes Daily   Continuous Infusions: . sodium chloride     PRN Meds: sodium chloride, acetaminophen, nitroGLYCERIN, ondansetron (ZOFRAN) IV, sodium chloride flush   Vital Signs    Vitals:   03/22/18 1627 03/22/18 1632 03/22/18 2036 03/23/18 0553  BP: (!) 105/51 114/65 114/71 (!) 114/43  Pulse: 70 69 (!) 59 (!) 59  Resp: 18 (!) 27 (!) 24 16  Temp:   (!) 97.5 F (36.4 C) 98.3 F (36.8 C)  TempSrc:   Oral Oral  SpO2: 96% 98% 96% 100%  Weight:    156 lb 14.4 oz (71.2 kg)  Height:        Intake/Output Summary (Last 24 hours) at 03/23/2018 0942 Last data filed at 03/23/2018 0900 Gross per 24 hour  Intake 600 ml  Output 400 ml  Net 200 ml   Filed Weights   03/21/18 0628 03/22/18 0500 03/23/18 0553  Weight: 157 lb 4.8 oz (71.4 kg) 156 lb 14.4 oz (71.2 kg) 156 lb 14.4 oz (71.2 kg)    Telemetry    Sinus with PVCs- Personally Reviewed  Physical Exam   GEN: WD WN NAD Neck: supple Cardiac: RRR Respiratory: CTA GI: Soft, NT/ND MS: No edema; radial cath site with no hematoma Neuro:  No focal findings  Labs    Chemistry Recent Labs  Lab 03/21/18 0538 03/22/18 0523 03/23/18 0520  NA 140 140 138  K 3.9  3.5 3.7  CL 107 106 107  CO2 27 27 26   GLUCOSE 84 102* 92  BUN 13 12 12   CREATININE 0.92 0.88 0.88  CALCIUM 7.8* 7.8* 7.6*  GFRNONAA 59* >60 >60  GFRAA >60 >60 >60  ANIONGAP 6 7 5      Hematology Recent Labs  Lab 03/21/18 0538 03/22/18 0523 03/23/18 0520  WBC 4.4 4.9 4.8  RBC 3.24* 3.26* 3.17*  HGB 10.5* 10.5* 10.2*  HCT 32.3* 32.2* 30.9*  MCV 99.7 98.8 97.5  MCH 32.4 32.2 32.2  MCHC 32.5 32.6 33.0  RDW 11.9 11.9 11.8  PLT 157 160 161    Cardiac Enzymes Recent Labs  Lab 03/18/18 2224 03/19/18 0430 03/19/18 1041  TROPONINI <0.03 <0.03 <0.03    Recent Labs  Lab 03/18/18 1531  TROPIPOC 0.09*     BNP Recent Labs  Lab 03/18/18 2224  BNP 232.5*     Radiology    Nm Myocar Multi W/spect W/wall Motion / Ef  Result Date: 03/21/2018 CLINICAL DATA:  Chest pain and prior myocardial infarction. Abnormal EKG. Prior stent and CABG. Hypertension. Coronary artery disease.  Lower extremity edema. EXAM: MYOCARDIAL IMAGING WITH SPECT (REST AND PHARMACOLOGIC-STRESS) GATED LEFT VENTRICULAR WALL MOTION STUDY LEFT VENTRICULAR EJECTION FRACTION TECHNIQUE: Standard myocardial SPECT imaging was performed after resting intravenous injection of 10 mCi Tc-45m tetrofosmin. Subsequently, intravenous infusion of Lexiscan was performed under the supervision of the Cardiology staff. At peak effect of the drug, 30 mCi Tc-34m tetrofosmin was injected intravenously and standard myocardial SPECT imaging was performed. Quantitative gated imaging was also performed to evaluate left ventricular wall motion, and estimate left ventricular ejection fraction. COMPARISON:  None. FINDINGS: Perfusion: Apical scarring or thinning. Large size moderate severity reduction in anterior wall and anterior apical activity, up to 21% loss of activity on stress images as compared to rest images, compatible with inducible ischemia. In addition, in the mid heart and basilar inferolateral wall there is a moderate size mild  severity focus of inducible ischemia and mild underlying scarring. Wall Motion: Lateral and apical hypokinesis. Poor wall thickening in the apex, inferior wall, and lateral wall. Left Ventricular Ejection Fraction: 44 % End diastolic volume 628 ml (moderately abnormally elevated) End systolic volume 71 ml (considerably abnormally elevated) IMPRESSION: 1. Large size moderate severity anterior wall and anterior apical region of inducible ischemia, up to 21% loss of activity. Inferolateral wall mild inducible ischemia and underlying scarring a moderate-sized area of in the mid heart and basilar region. Moderate end- diastolic left ventricular enlargement and considerable end- systolic left ventricular enlargement. 2. Hypokinesis and poor wall thickening is noted above. 3. Left ventricular ejection fraction 44% 4. Non invasive risk stratification*: High *2012 Appropriate Use Criteria for Coronary Revascularization Focused Update: J Am Coll Cardiol. 3151;76(1):607-371. http://content.airportbarriers.com.aspx?articleid=[email or web address] Electronically Signed   By: Van Clines M.D.   On: 03/21/2018 12:51    Patient Profile     77 y.o. female with past medical history of coronary artery disease status post coronary artery bypass and graft admitted with chest pain similar to previous angina by her report.  Enzymes negative.    Assessment & Plan    1 Chest pain-catheterization results noted.  Plan is medical therapy.  Continue aspirin and statin.  2 chronic diastolic congestive heart failure-patient is euvolemic.  Resume Lasix at preadmission dose of 60 mg twice daily.  Resume preadmission potassium dose as well.  3 hypertension-blood pressure is controlled.  Continue present medications.  4 coronary artery disease status post coronary artery bypass graft-continue aspirin and statin.  5 history of paroxysmal atrial fibrillation-Patient remains in sinus rhythm.  Continue carvedilol for rate control if atrial  fibrillation recurs.  Resume Coumadin at preadmission dose.  Check INR in Coumadin clinic on Monday.  Discharge today and follow-up with Dr. Aundra Dubin in 6 to 8 weeks.  As outlined check INR and Coumadin clinic on Monday. >30 min PA and physician time D2  For questions or updates, please contact [place] Please consult [email or web address] for contact info under Cardiology/STEMI.      Signed, Kirk Ruths, MD  03/23/2018, 9:42 AM

## 2018-03-28 ENCOUNTER — Ambulatory Visit (INDEPENDENT_AMBULATORY_CARE_PROVIDER_SITE_OTHER): Payer: Medicare Other | Admitting: *Deleted

## 2018-03-28 DIAGNOSIS — I4891 Unspecified atrial fibrillation: Secondary | ICD-10-CM

## 2018-03-28 DIAGNOSIS — Z5181 Encounter for therapeutic drug level monitoring: Secondary | ICD-10-CM

## 2018-03-28 LAB — POCT INR: INR: 1.4 — AB (ref 2.0–3.0)

## 2018-03-28 NOTE — Patient Instructions (Signed)
Description   Today take 1.5 tablets, tomorrow take 1.5 tablets, then continue taking coumadin 1 tablet daily except 1 and 1/2 tablets on  Fridays.  Recheck in 1 week. Coumadin Clinic [phone] Main [phone]

## 2018-03-29 ENCOUNTER — Encounter: Payer: Self-pay | Admitting: Podiatry

## 2018-03-29 ENCOUNTER — Ambulatory Visit (INDEPENDENT_AMBULATORY_CARE_PROVIDER_SITE_OTHER): Payer: Medicare Other | Admitting: Podiatry

## 2018-03-29 DIAGNOSIS — E1140 Type 2 diabetes mellitus with diabetic neuropathy, unspecified: Secondary | ICD-10-CM | POA: Diagnosis not present

## 2018-03-29 DIAGNOSIS — M79676 Pain in unspecified toe(s): Secondary | ICD-10-CM | POA: Diagnosis not present

## 2018-03-29 DIAGNOSIS — D689 Coagulation defect, unspecified: Secondary | ICD-10-CM

## 2018-03-29 DIAGNOSIS — B351 Tinea unguium: Secondary | ICD-10-CM | POA: Diagnosis not present

## 2018-03-29 NOTE — Progress Notes (Signed)
Patient ID: Christine Gay, female   DOB: 05/06/1941, 77 y.o.   MRN: [id number] Complaint:  Visit Type: Patient returns to my office for continued preventative foot care services. Complaint: Patient states" my nails have grown long and thick and become painful to walk and wear shoes" Patient has been diagnosed with DM with no foot complications. The patient presents for preventative foot care services. No changes to ROS  Podiatric Exam: Vascular: dorsalis pedis and posterior tibial pulses are palpable bilateral. Capillary return is immediate. Temperature gradient is WNL. Skin turgor WNL  Sensorium: Diminished  Semmes Weinstein monofilament test. Normal tactile sensation bilaterally. Nail Exam: Pt has thick disfigured discolored nails with subungual debris noted bilateral entire nail hallux through fifth toenails Ulcer Exam: There is no evidence of ulcer or pre-ulcerative changes or infection. Orthopedic Exam: Muscle tone and strength are WNL. No limitations in general ROM. No crepitus or effusions noted. Foot type and digits show no abnormalities. Bony prominences are unremarkable. HAV 1st  MPJ B/L.  Hammer toes 2 B/l Skin: No Porokeratosis. No infection or ulcers.  Asymptomatic Callus sub 5th B/L  Diagnosis:  Onychomycosis, , Pain in right toe, pain in left toes,   Treatment & Plan Procedures and Treatment: Consent by patient was obtained for treatment procedures. The patient understood the discussion of treatment and procedures well. All questions were answered thoroughly reviewed. Debridement of mycotic and hypertrophic toenails, 1 through 5 bilateral and clearing of subungual debris. No ulceration, no infection noted. Return Visit-Office Procedure: Patient instructed to return to the office for a follow up visit 3 months for continued evaluation and treatment.  Chane Magner DPM 

## 2018-03-31 ENCOUNTER — Encounter: Payer: Self-pay | Admitting: *Deleted

## 2018-04-06 ENCOUNTER — Ambulatory Visit (INDEPENDENT_AMBULATORY_CARE_PROVIDER_SITE_OTHER): Payer: Medicare Other | Admitting: Pharmacist

## 2018-04-06 DIAGNOSIS — I4891 Unspecified atrial fibrillation: Secondary | ICD-10-CM | POA: Diagnosis not present

## 2018-04-06 DIAGNOSIS — Z5181 Encounter for therapeutic drug level monitoring: Secondary | ICD-10-CM

## 2018-04-06 LAB — POCT INR: INR: 1.7 — AB (ref 2.0–3.0)

## 2018-04-06 NOTE — Patient Instructions (Signed)
Description   Today take 1.5 tablets, tomorrow take 2 tablets, then continue taking coumadin 1 tablet daily except 1 and 1/2 tablets on  Fridays.  Recheck in 1 week. Coumadin Clinic [phone] Main [phone]

## 2018-04-14 ENCOUNTER — Ambulatory Visit (INDEPENDENT_AMBULATORY_CARE_PROVIDER_SITE_OTHER): Payer: Medicare Other | Admitting: *Deleted

## 2018-04-14 DIAGNOSIS — I4891 Unspecified atrial fibrillation: Secondary | ICD-10-CM | POA: Diagnosis not present

## 2018-04-14 DIAGNOSIS — Z5181 Encounter for therapeutic drug level monitoring: Secondary | ICD-10-CM | POA: Diagnosis not present

## 2018-04-14 LAB — POCT INR: INR: 2 (ref 2.0–3.0)

## 2018-04-14 NOTE — Patient Instructions (Signed)
Description   Continue taking coumadin 1 tablet daily except 1 and 1/2 tablets on  Fridays.  Recheck in 2 weeks. Coumadin Clinic [phone] Main [phone]

## 2018-04-17 ENCOUNTER — Ambulatory Visit (INDEPENDENT_AMBULATORY_CARE_PROVIDER_SITE_OTHER): Payer: Medicare Other | Admitting: *Deleted

## 2018-04-17 DIAGNOSIS — I442 Atrioventricular block, complete: Secondary | ICD-10-CM | POA: Diagnosis not present

## 2018-04-17 NOTE — Progress Notes (Signed)
Remote pacemaker transmission.   

## 2018-04-18 ENCOUNTER — Encounter: Payer: Self-pay | Admitting: Cardiology

## 2018-04-19 ENCOUNTER — Other Ambulatory Visit: Payer: Self-pay | Admitting: *Deleted

## 2018-04-19 NOTE — Patient Outreach (Signed)
[facility] [place] [facility]) Care Management  04/19/2018  Alexas Basulto Daloia [birth year] [id number]   [place] Initial Assessment  Referral Date:  03/22/2018 Referral Source:  Hospital Liaison Reason for Referral:  Disease Management Education Insurance:  [employer]   Outreach Attempt:  Outreach attempt #1 to patient for initial telephone assessment. No answer. RN Health Coach left HIPAA compliant voicemail message along with contact information.  Plan:  RN Health Coach will send unsuccessful outreach letter to patient.  RN Health Coach will make another outreach attempt to patient within 3-4 business days if no return call back from patient.   [place] [phone] [email or web address]

## 2018-04-26 ENCOUNTER — Other Ambulatory Visit: Payer: Self-pay | Admitting: *Deleted

## 2018-04-26 NOTE — Patient Outreach (Signed)
[place] [facility]) Care Management  04/26/2018  Christine Gay 1940/10/10 [id number]   [place] Initial Assessment  Referral Date:  03/22/2018 Referral Source:  Hospital Liaison Reason for Referral:  Disease Management Education Insurance:  [employer]   Outreach Attempt:  Outreach attempt #2 to patient for initial telephone assessment.  Patient answered and verified HIPAA.  RN Health Coach introduced self and role and reviewed [facility] services.  Patient stated she receives biweekly telephone calls from "Robin" at Dr. Lina Sar office.  States this has helped and she does not want to get confused with more telephone calls at the present time.  Patient declining [facility] services at this time.  Encouraged to contact [facility] in the future if she feels she can benefit from services.  Plan:  RN Health Coach will close case based on patient declining services at this time.  [place] [phone] [email or web address]

## 2018-05-09 ENCOUNTER — Ambulatory Visit (INDEPENDENT_AMBULATORY_CARE_PROVIDER_SITE_OTHER): Payer: Medicare Other | Admitting: *Deleted

## 2018-05-09 DIAGNOSIS — I4891 Unspecified atrial fibrillation: Secondary | ICD-10-CM

## 2018-05-09 DIAGNOSIS — Z5181 Encounter for therapeutic drug level monitoring: Secondary | ICD-10-CM | POA: Diagnosis not present

## 2018-05-09 DIAGNOSIS — I480 Paroxysmal atrial fibrillation: Secondary | ICD-10-CM

## 2018-05-09 LAB — POCT INR: INR: 1.4 — AB (ref 2.0–3.0)

## 2018-05-09 NOTE — Patient Instructions (Signed)
Description   Today take 1.5 tablets, then change your dose to 1 tablet daily except 1 and 1/2 tablets on  Mondays and Fridays.  Recheck in 2 weeks. Coumadin Clinic [phone] Main [phone]

## 2018-05-12 ENCOUNTER — Ambulatory Visit (HOSPITAL_COMMUNITY)
Admission: RE | Admit: 2018-05-12 | Discharge: 2018-05-12 | Disposition: A | Discharge status: Home / Self Care | Payer: Medicare Other | Source: Ambulatory Visit | Attending: Cardiology | Admitting: Cardiology

## 2018-05-12 ENCOUNTER — Other Ambulatory Visit: Payer: Self-pay

## 2018-05-12 VITALS — BP 114/52 | HR 60 | Wt 160.0 lb

## 2018-05-12 DIAGNOSIS — R0989 Other specified symptoms and signs involving the circulatory and respiratory systems: Secondary | ICD-10-CM | POA: Diagnosis not present

## 2018-05-12 DIAGNOSIS — H42 Glaucoma in diseases classified elsewhere: Secondary | ICD-10-CM | POA: Diagnosis not present

## 2018-05-12 DIAGNOSIS — E1139 Type 2 diabetes mellitus with other diabetic ophthalmic complication: Secondary | ICD-10-CM | POA: Insufficient documentation

## 2018-05-12 DIAGNOSIS — Z9049 Acquired absence of other specified parts of digestive tract: Secondary | ICD-10-CM | POA: Diagnosis not present

## 2018-05-12 DIAGNOSIS — Z950 Presence of cardiac pacemaker: Secondary | ICD-10-CM | POA: Diagnosis not present

## 2018-05-12 DIAGNOSIS — Z7901 Long term (current) use of anticoagulants: Secondary | ICD-10-CM | POA: Insufficient documentation

## 2018-05-12 DIAGNOSIS — I447 Left bundle-branch block, unspecified: Secondary | ICD-10-CM | POA: Insufficient documentation

## 2018-05-12 DIAGNOSIS — I480 Paroxysmal atrial fibrillation: Secondary | ICD-10-CM | POA: Diagnosis not present

## 2018-05-12 DIAGNOSIS — K219 Gastro-esophageal reflux disease without esophagitis: Secondary | ICD-10-CM | POA: Diagnosis not present

## 2018-05-12 DIAGNOSIS — I25810 Atherosclerosis of coronary artery bypass graft(s) without angina pectoris: Secondary | ICD-10-CM | POA: Diagnosis not present

## 2018-05-12 DIAGNOSIS — E039 Hypothyroidism, unspecified: Secondary | ICD-10-CM | POA: Diagnosis not present

## 2018-05-12 DIAGNOSIS — G40909 Epilepsy, unspecified, not intractable, without status epilepticus: Secondary | ICD-10-CM | POA: Diagnosis not present

## 2018-05-12 DIAGNOSIS — I5032 Chronic diastolic (congestive) heart failure: Secondary | ICD-10-CM | POA: Insufficient documentation

## 2018-05-12 DIAGNOSIS — Z79890 Hormone replacement therapy (postmenopausal): Secondary | ICD-10-CM | POA: Insufficient documentation

## 2018-05-12 DIAGNOSIS — H409 Unspecified glaucoma: Secondary | ICD-10-CM | POA: Diagnosis not present

## 2018-05-12 DIAGNOSIS — Z79899 Other long term (current) drug therapy: Secondary | ICD-10-CM | POA: Insufficient documentation

## 2018-05-12 DIAGNOSIS — I442 Atrioventricular block, complete: Secondary | ICD-10-CM | POA: Diagnosis not present

## 2018-05-12 DIAGNOSIS — K922 Gastrointestinal hemorrhage, unspecified: Secondary | ICD-10-CM | POA: Diagnosis not present

## 2018-05-12 DIAGNOSIS — I2510 Atherosclerotic heart disease of native coronary artery without angina pectoris: Secondary | ICD-10-CM | POA: Diagnosis not present

## 2018-05-12 DIAGNOSIS — Z7984 Long term (current) use of oral hypoglycemic drugs: Secondary | ICD-10-CM | POA: Insufficient documentation

## 2018-05-12 DIAGNOSIS — I110 Hypertensive heart disease with heart failure: Secondary | ICD-10-CM | POA: Diagnosis not present

## 2018-05-12 DIAGNOSIS — E785 Hyperlipidemia, unspecified: Secondary | ICD-10-CM | POA: Insufficient documentation

## 2018-05-12 LAB — BASIC METABOLIC PANEL
Anion gap: 5 (ref 5–15)
BUN: 10 mg/dL (ref 8–23)
CALCIUM: 7.5 mg/dL — AB (ref 8.9–10.3)
CO2: 26 mmol/L (ref 22–32)
Chloride: 107 mmol/L (ref 98–111)
Creatinine, Ser: 0.85 mg/dL (ref 0.44–1.00)
GFR calc Af Amer: >60 mL/min (ref >60)
GFR calc non Af Amer: >60 mL/min (ref >60)
GLUCOSE: 95 mg/dL (ref 70–99)
Potassium: 4.6 mmol/L (ref 3.5–5.1)
Sodium: 138 mmol/L (ref 135–145)

## 2018-05-12 LAB — CBC
HCT: 35.1 % — ABNORMAL LOW (ref 36.0–46.0)
HEMOGLOBIN: 11.5 g/dL — AB (ref 12.0–15.0)
MCH: 32.9 pg (ref 26.0–34.0)
MCHC: 32.8 g/dL (ref 30.0–36.0)
MCV: 100.3 fL — ABNORMAL HIGH (ref 78.0–100.0)
PLATELETS: 151 10*3/uL (ref 150–400)
RBC: 3.5 MIL/uL — ABNORMAL LOW (ref 3.87–5.11)
RDW: 11.8 % (ref 11.5–15.5)
WBC: 5.1 10*3/uL (ref 4.0–10.5)

## 2018-05-12 MED ORDER — NITROGLYCERIN 0.4 MG SL SUBL: SUBLINGUAL_TABLET | SUBLINGUAL | 11 refills | Status: DC

## 2018-05-12 NOTE — Patient Instructions (Signed)
Labs today (will call for abnormal results, otherwise no news is good news)  Follow up in 3 months.  

## 2018-05-13 NOTE — Progress Notes (Signed)
Patient ID: Christine Gay, female   DOB: [birth year], 77 y.o.   MRN: [id number] PCP: Dr. Delfina Redwood Cardiology: Dr. Aundra Dubin  77 y.o. with history of CAD s/p CABG, DM, and HTN presents for followup of CHF and CAD.  She had a Lexiscan Cardiolite in 2014 that was an intermediate risk study and echo showed basal to mid inferior hypokinesis with EF 55-60%.  I did a cardaic catheterization in 8/14 that showed patent LIMA-LAD, occluded SVG-OM1, and 95% ostial LCx stenosis.  She had PCI to ostial LCx with Promus DES.  Cardiolite in 5/15 showed no ischemia or infarction.  She had an upper GI bleed in 9/15, she is now off Plavix.   In 1/18, she was admitted with severe fatigue and dyspnea and was found to be in complete heart block.  Troponin was up to 4.9.   She had LHC showing patent LIMA-LAD and SVG-OM1, patent LCx stent, and occluded SVG-OM2 (known from past), no evidence for plaque rupture/new disease. Echo showed EF stable at 50-55%.  She had St Jude PPM placed.   She was started on warfarin due to paroxysmal atrial fibrillation noted by device interrogation.   She was admitted with chest pain in 6/19, concerning for unstable angina.  LHC showed stable anatomy compared to 1/18 cath. Echo showed EF 50-55%.   She is doing well currently. She has some trouble with balance and is using a walker most of the time. No exertional dyspnea but not very active. No chest pain.  No BRBPR/melena.    ECG (personally reviewed): A-V sequential pacing.   Labs (3/13): K 4.2, creatinine 0.77 Labs (6/14): LDL 80, HDL 69 Labs (8/14): BNP 971 => 420 => 138, creatinine 0.8, K 4.3 Labs (11/14): K 4.1, creatinine 0.8, BNP 88 Labs (3/15): K 3.4, creatinine 0.86, LDL 85 Labs (9/15): K 3.2, creatinine 0.63, hgb 8 Labs (1/16): LDL 96 Labs (2/16): K 4.2, creatinine 0.89 Labs (3/16): LDL 91, HDL 56 Labs (7/16): TSH normal, K 4.7, creatinine 0.9, hgb 11.4, LDL 93 Labs (8/17): LDL 83, HDL 62 Labs (1/18): K 4, creatinine 1.09, hgb  12.3 Labs (2/18): K 4.2, creatinine 0.81, BNP 163 Labs (3/18): K 3.9, creatinine 0.96 Labs (6/18): K 3.9, creatinine 0.86, hgb 11.2 Labs (6/19): K 3.7, creatinine 0.88, LDL 63, HDL 37  PMH: 1. GERD 2. Seizure disorder 3. Type II diabetes 4. Chronic LBBB 5. CAD: s/p CABG in 1992, had PCI several years ago.  Echo (2/11) with EF 55%, moderate LVH, aortic sclerosis.  LHC (8/14) with patent LIMA-LAD, totally occluded SVG-OM1, 95% ostial LCx, totally occluded small nondominant RCA with EF 55%.  Patient had Promus DES to pLCx. Lexiscan Cardiolite (5/15) with EF 57%, mild breast attenuation, no ischemia or infarction.  Lexiscan Cardiolite (2/16) with EF 44%, small partially reversible mid anterior perfusion defect may be due to soft tissue attenuation.   - LHC (1/18): patent LIMA-LAD, totally occluded LAD, patent SVG-OM1, totally occluded OM2 and SVG-OM2, patent stent in LCx, totally occluded nondominant RCA with right->right collaterals.  - LHC (6/19): Same as 1/18. 6. Glaucoma 7. Hypothyroidism 8. HTN 9. Cholecystectomy 10. Diastolic CHF: Echo (5/62) with EF 55-60%, basal to mid inferior hypokinesis, mild MR and mild AI. Echo (2/16) with EF 55-60%, mild LAE. - Echo (1/18): EF 50-55%, mild LVH, mildly decreased RV systolic function, mild AI.   - Echo (6/19): EF 50-55%, basal inferolateral HK, mild MR, mild AI.  11. Carotid stenosis: Carotid dopplers (6/15) with 40-59% bilateral ICA stenosis.  -  Carotid dopplers (7/59) with 16-38% LICA stenosis.  - Carotid dopplers (5/18): Mild disease bilaterally.  - Carotid dopplers (5/19): Mild disease bilaterally 12. GI bleed 9/15: EGD showed a polypoid gastric lesion that was friable, benign pathology.  13. Complete heart block: St Jude PPM in 1/18.  14. Atrial fibrillation: Paroxysmal.   SH: Widow, lives in [place], does not smoke.   FH: No premature CAD.  Diabetes.   ROS: All systems reviewed and negative except as per HPI.   Current Outpatient  Medications  Medication Sig Dispense Refill  . acetaminophen (TYLENOL) 325 MG tablet Take 1-2 tablets (325-650 mg total) by mouth every 4 (four) hours as needed for mild pain.    . carvedilol (COREG) 6.25 MG tablet Take 0.5 tablets (3.125 mg total) by mouth 2 (two) times daily.    . cycloSPORINE (RESTASIS) 0.05 % ophthalmic emulsion Place 1 drop into both eyes 2 (two) times daily.    Marland Kitchen ezetimibe (ZETIA) 10 MG tablet TAKE 1 TABLET BY MOUTH DAILY. 90 tablet 3  . ferrous sulfate 325 (65 FE) MG tablet Take 1 tablet (325 mg total) by mouth 2 (two) times daily with a meal. 90 tablet 0  . furosemide (LASIX) 40 MG tablet Take 1.5 tablets (60 mg total) by mouth 2 (two) times daily. 180 tablet 4  . glucose blood test strip Test blood glucose three times daily. 100 each 0  . ibandronate (BONIVA) 150 MG tablet Take 150 mg by mouth every 30 (thirty) days. Take in the morning with a full glass of water, on an empty stomach, and do not take anything else by mouth or lie down for the next 30 min.    . isosorbide mononitrate (IMDUR) 60 MG 24 hr tablet TAKE 1 TABLET BY MOUTH 2 TIMES DAILY. 180 tablet 0  . levothyroxine (SYNTHROID, LEVOTHROID) 100 MCG tablet Take 100 mcg by mouth daily before breakfast.    . metFORMIN (GLUCOPHAGE-XR) 500 MG 24 hr tablet Take 500 mg by mouth 2 (two) times daily.     . nitroGLYCERIN (NITROSTAT) 0.4 MG SL tablet PLACE 1 TABLET (0.4 MG TOTAL) UNDER THE TONGUE EVERY 5 (FIVE) MINUTES AS NEEDED FOR CHEST PAIN. 25 tablet 11  . pantoprazole (PROTONIX) 40 MG tablet Take 1 tablet (40 mg total) by mouth daily. 90 tablet 2  . phenytoin (DILANTIN) 100 MG ER capsule Take 100-200 mg by mouth 2 (two) times daily. 200 mg in the morning and 100 mg in the evening    . potassium chloride SA (K-DUR,KLOR-CON) 20 MEQ tablet TAKE 1 TABLET BY MOUTH 2 TIMES DAILY. 60 tablet 3  . rosuvastatin (CRESTOR) 40 MG tablet TAKE 1 TABLET BY MOUTH DAILY 90 tablet 2  . timolol (TIMOPTIC) 0.5 % ophthalmic solution Place 1  drop into both eyes daily.   6  . warfarin (COUMADIN) 5 MG tablet TAKE AS DIRECTED BY COUMADIN CLINIC (Patient taking differently: TAKE 7.5mg  on Fridays and 5mg  on all other days or AS DIRECTED BY COUMADIN CLINIC) 40 tablet 3   No current facility-administered medications for this encounter.     BP (!) 114/52 (BP Location: Left Arm, Patient Position: Sitting)   Pulse 60   Wt 72.6 kg (160 lb)   SpO2 99%   BMI 31.25 kg/m  General: NAD Neck: No JVD, no thyromegaly or thyroid nodule.  Lungs: Clear to auscultation bilaterally with normal respiratory effort. CV: Nondisplaced PMI.  Heart regular S1/S2, no S3/S4, no murmur.  No peripheral edema.  No carotid bruit.  Normal pedal pulses.  Abdomen: Soft, nontender, no hepatosplenomegaly, no distention.  Skin: Intact without lesions or rashes.  Neurologic: Alert and oriented x 3.  Psych: Normal affect. Extremities: No clubbing or cyanosis.  HEENT: Normal.   Assessment/Plan: 1. CAD: Status post Promus DES to ostial LCx in 8/14.  Last cath in 6/19 showed stable anatomy.  No chest pain.  - Continue Imdur and Coreg at current doses.  - She is not a ranolazine candidate given use of Dilantin.  - Continue ASA 81 and statin/Zetia.  2. Diastolic CHF: EF 25-75% on 6/19 echo.  NYHA class II. Weight is down.  Not volume overloaded on exam.  - Continue Lasix 60 mg bid.  BMET today.  3. Hyperlipidemia: She is on Zetia and Crestor. Good lipids 6/19.  4. Carotid bruit: Mild disease on carotid dopplers in 5/19.  5. H/o GI bleeding: No melena or hematochezia.  6. Complete heart block: S/p St Jude PPM.  7. Paroxysmal atrial fibrillation: She is not in afib today.  Continue warfarin.   Followup in 3 months.   [name] 05/13/2018

## 2018-05-22 ENCOUNTER — Ambulatory Visit (INDEPENDENT_AMBULATORY_CARE_PROVIDER_SITE_OTHER): Payer: Medicare Other | Admitting: Pharmacist

## 2018-05-22 ENCOUNTER — Other Ambulatory Visit: Payer: Self-pay | Admitting: Cardiology

## 2018-05-22 DIAGNOSIS — Z5181 Encounter for therapeutic drug level monitoring: Secondary | ICD-10-CM

## 2018-05-22 DIAGNOSIS — I4891 Unspecified atrial fibrillation: Secondary | ICD-10-CM

## 2018-05-22 LAB — POCT INR: INR: 1.9 — AB (ref 2.0–3.0)

## 2018-05-22 NOTE — Patient Instructions (Signed)
Description   Today take 2 tablets, then change your dose to 1 tablet daily except 1 and 1/2 tablets on Mondays, Wednesdays and Fridays.  Recheck in 2 weeks. Coumadin Clinic [phone] Main [phone]

## 2018-05-23 DIAGNOSIS — H353132 Nonexudative age-related macular degeneration, bilateral, intermediate dry stage: Secondary | ICD-10-CM | POA: Diagnosis not present

## 2018-05-23 DIAGNOSIS — H16223 Keratoconjunctivitis sicca, not specified as Sjogren's, bilateral: Secondary | ICD-10-CM | POA: Diagnosis not present

## 2018-05-23 DIAGNOSIS — H35033 Hypertensive retinopathy, bilateral: Secondary | ICD-10-CM | POA: Diagnosis not present

## 2018-05-24 LAB — CUP PACEART REMOTE DEVICE CHECK
Battery Remaining Percentage: 95.5 %
Brady Statistic AP VS Percent: 1 %
Brady Statistic AS VP Percent: 83 %
Brady Statistic AS VS Percent: 1 %
Brady Statistic RV Percent Paced: 99 %
Date Time Interrogation Session: 20190722064545
Implantable Lead Implant Date: 20180103
Implantable Lead Location: 753859
Lead Channel Impedance Value: 480 Ohm
Lead Channel Pacing Threshold Amplitude: 0.375 V
Lead Channel Sensing Intrinsic Amplitude: 12 mV
Lead Channel Setting Pacing Amplitude: 1.5 V
Lead Channel Setting Pacing Pulse Width: 0.5 ms
Lead Channel Setting Sensing Sensitivity: 4 mV
MDC IDC LEAD IMPLANT DT: 20180103
MDC IDC LEAD LOCATION: 753860
MDC IDC MSMT BATTERY REMAINING LONGEVITY: 131 mo
MDC IDC MSMT BATTERY VOLTAGE: 3.01 V
MDC IDC MSMT LEADCHNL RA IMPEDANCE VALUE: 430 Ohm
MDC IDC MSMT LEADCHNL RA PACING THRESHOLD AMPLITUDE: 0.5 V
MDC IDC MSMT LEADCHNL RA PACING THRESHOLD PULSEWIDTH: 0.5 ms
MDC IDC MSMT LEADCHNL RA SENSING INTR AMPL: 4.7 mV
MDC IDC MSMT LEADCHNL RV PACING THRESHOLD PULSEWIDTH: 0.5 ms
MDC IDC PG IMPLANT DT: 20180103
MDC IDC SET LEADCHNL RV PACING AMPLITUDE: 0.625
MDC IDC STAT BRADY AP VP PERCENT: 16 %
MDC IDC STAT BRADY RA PERCENT PACED: 15 %
Pulse Gen Model: 2272
Pulse Gen Serial Number: 7988263

## 2018-06-05 ENCOUNTER — Ambulatory Visit (INDEPENDENT_AMBULATORY_CARE_PROVIDER_SITE_OTHER): Payer: Medicare Other

## 2018-06-05 DIAGNOSIS — I4891 Unspecified atrial fibrillation: Secondary | ICD-10-CM | POA: Diagnosis not present

## 2018-06-05 DIAGNOSIS — Z5181 Encounter for therapeutic drug level monitoring: Secondary | ICD-10-CM

## 2018-06-05 LAB — POCT INR: INR: 2.5 (ref 2.0–3.0)

## 2018-06-05 NOTE — Patient Instructions (Signed)
Description   Continue on same dosage 1 tablet daily except 1.5 tablets on Mondays, Wednesdays and Fridays.  Recheck in 3 weeks. Coumadin Clinic [phone] Main [phone]

## 2018-06-08 IMAGING — CT CT HEAD W/O CM
3 of 4 series · 16 of 47 positions shown, 19 images · non-contrast
Comparison: 02/17/2006

CLINICAL DATA: Recent fall while on Coumadin, initial encounter

EXAM:
CT HEAD WITHOUT CONTRAST
TECHNIQUE: Contiguous axial images were obtained from the base of the skull
through the vertex without intravenous contrast.

[Series 32: 3d filtered head w/o · axial · non-contrast · 0.49mm/px · z∈[-20,+106]mm · 10 of 31 slices shown, 13 images]
[im 3/31  brain]
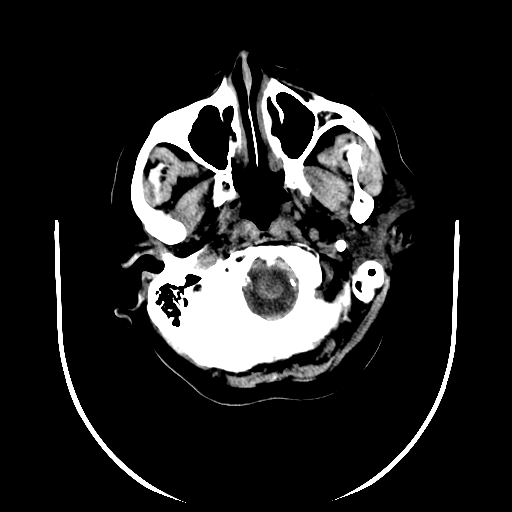
[im 3/31  bone]
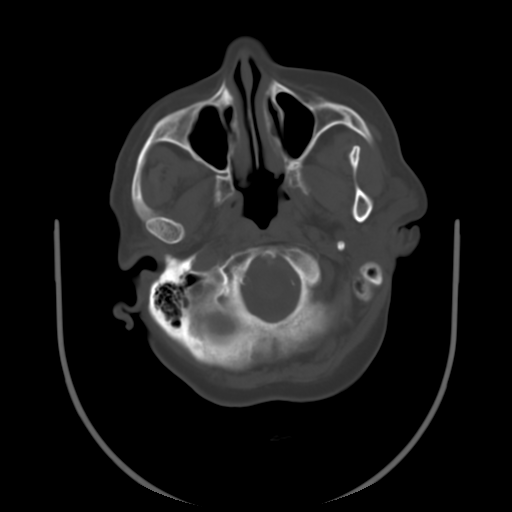
[im 5/31  brain]
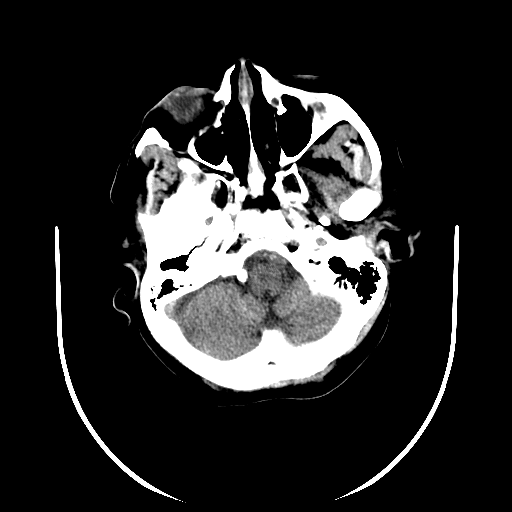
[im 9/31  brain]
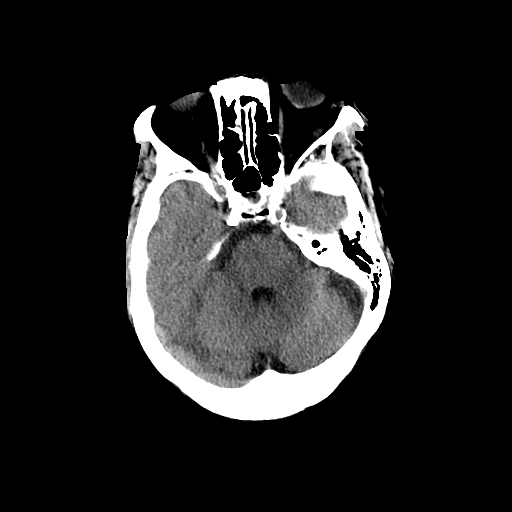
[im 11/31  brain]
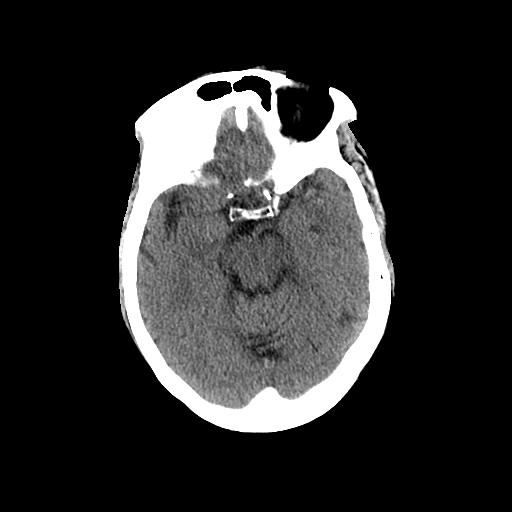
[im 13/31  brain]
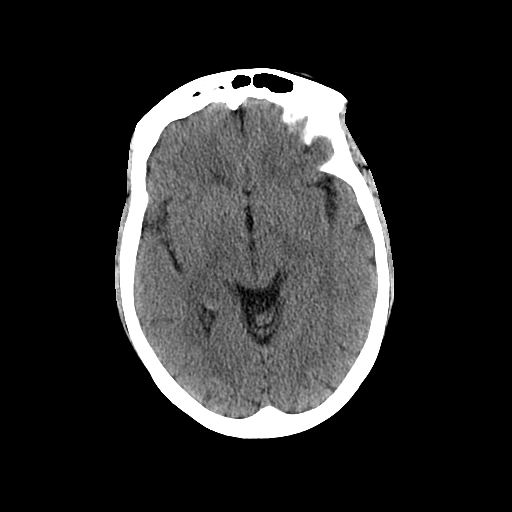
[im 13/31  bone]
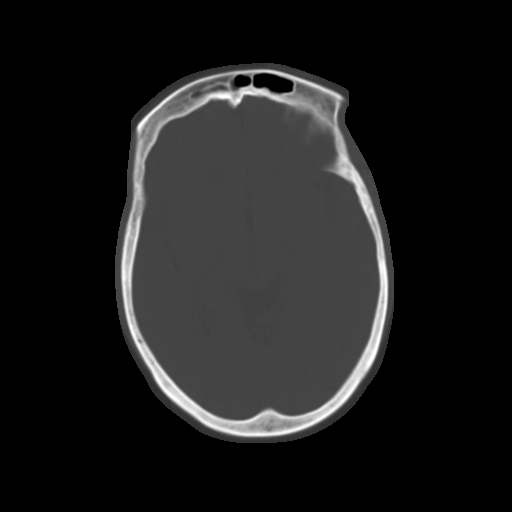
[im 18/31  brain]
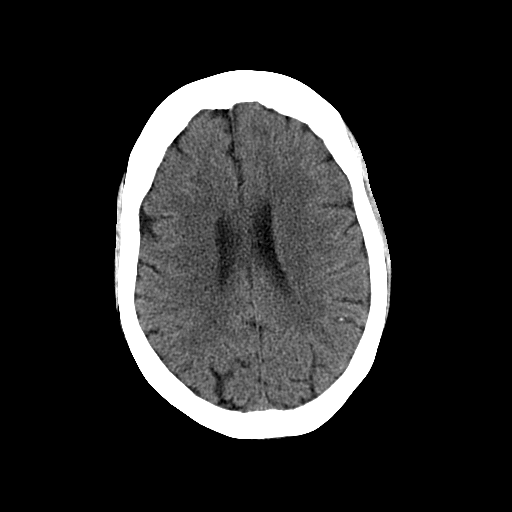
[im 20/31  brain]
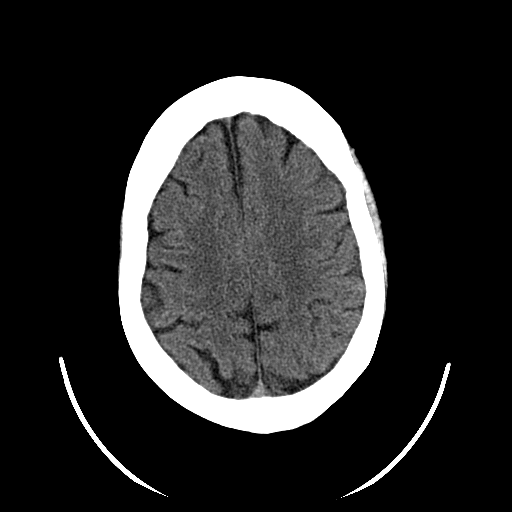
[im 22/31  brain]
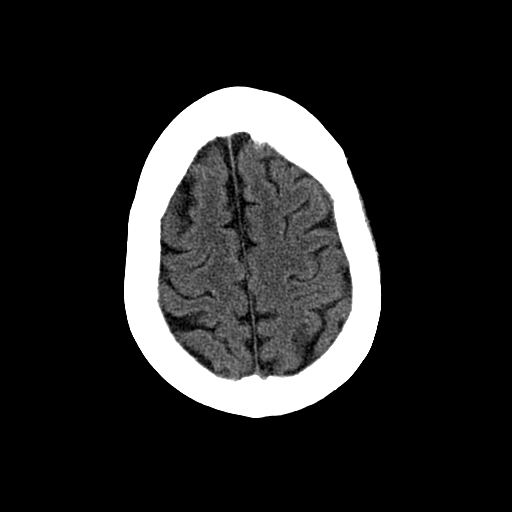
[im 26/31  brain]
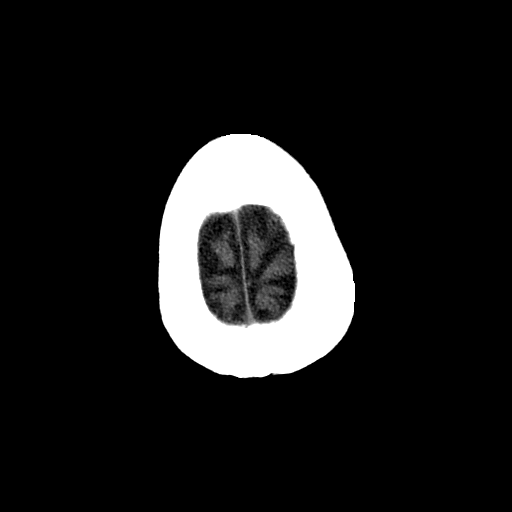
[im 26/31  bone]
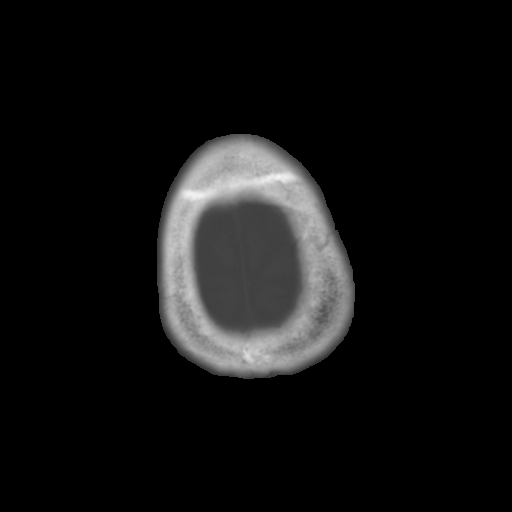
[im 28/31  brain]
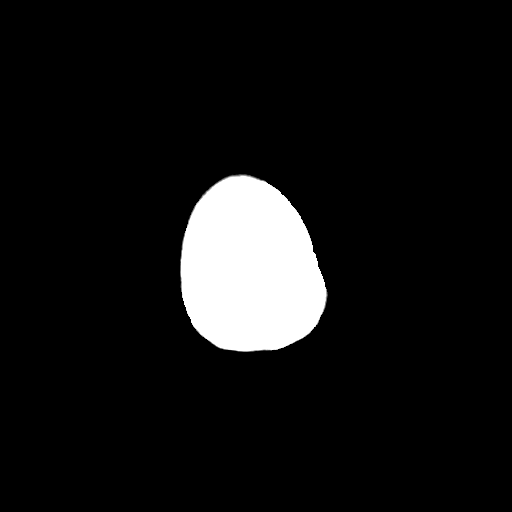

[Series 601: coronal brain · coronal · 0.49mm/px · 3 of 70 slices shown]
[im 24/70  brain]
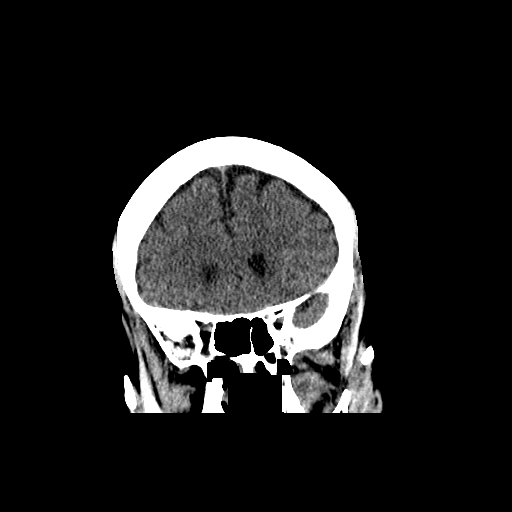
[im 31/70  brain]
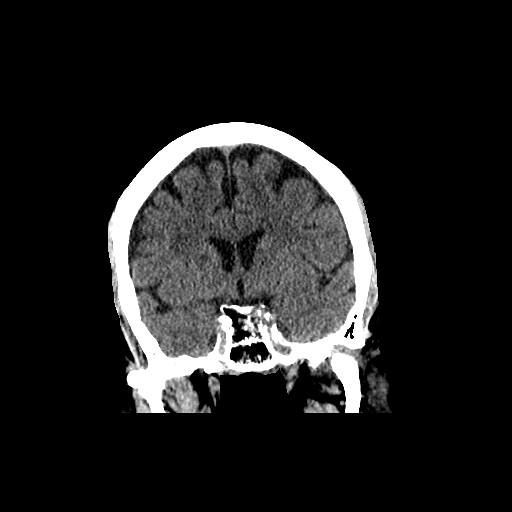
[im 39/70  brain]
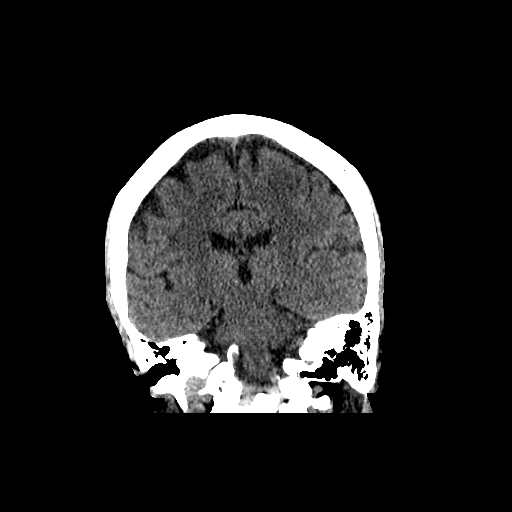

[Series 602: sagittal brain · sagittal · 0.49mm/px · 3 of 55 slices shown]
[im 19/55  brain]
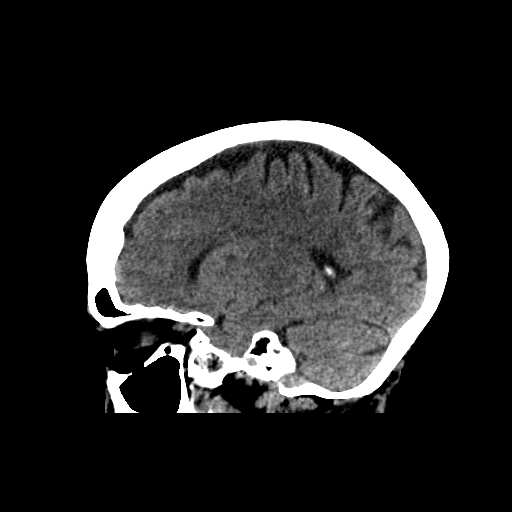
[im 28/55  brain]
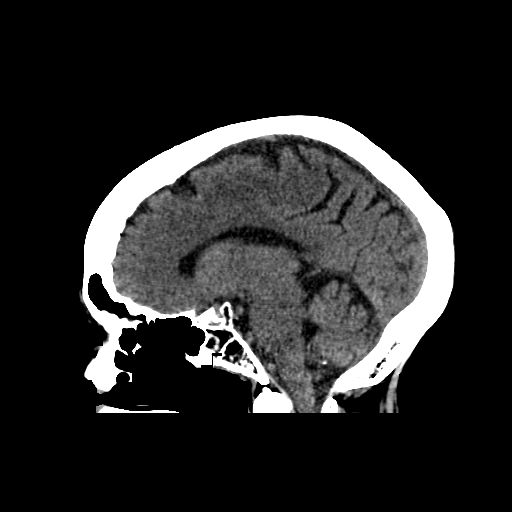
[im 37/55  brain]
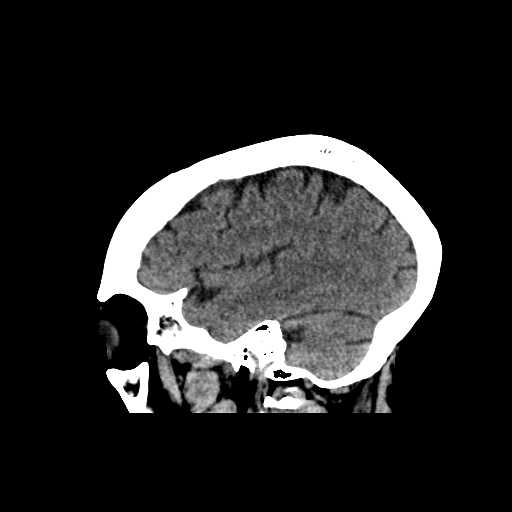

[16 of 47 positions shown; findings below may reference images not displayed]

FINDINGS: Brain: Mild atrophic changes are noted. No findings to suggest acute
hemorrhage, acute infarction or space-occupying mass lesion are
noted.

Vascular: No hyperdense vessel or unexpected calcification.

Skull: Normal. Negative for fracture or focal lesion.

Sinuses/Orbits: No acute finding.

Other: None.
IMPRESSION: Mild atrophic changes without acute abnormality.

These results will be called to the ordering clinician or
representative by the Radiologist Assistant, and communication
documented in the PACS or zVision Dashboard.

## 2018-06-26 ENCOUNTER — Other Ambulatory Visit (HOSPITAL_COMMUNITY): Payer: Self-pay | Admitting: Cardiology

## 2018-06-27 ENCOUNTER — Ambulatory Visit (INDEPENDENT_AMBULATORY_CARE_PROVIDER_SITE_OTHER): Payer: Medicare Other | Admitting: *Deleted

## 2018-06-27 DIAGNOSIS — Z5181 Encounter for therapeutic drug level monitoring: Secondary | ICD-10-CM

## 2018-06-27 DIAGNOSIS — I4891 Unspecified atrial fibrillation: Secondary | ICD-10-CM | POA: Diagnosis not present

## 2018-06-27 LAB — POCT INR: INR: 1.6 — AB (ref 2.0–3.0)

## 2018-06-27 NOTE — Patient Instructions (Addendum)
Description   Today take your 1/2 tablet, on Wednesdays take 2 tablets, on Thursday take 1.5 tablets, then start taking 1.5 tablets everyday except 1 tablet on Tuesdays, Thursdays and Saturdays. Recheck in 2 weeks. Coumadin Clinic [phone] Main [phone]

## 2018-06-28 ENCOUNTER — Encounter: Payer: Self-pay | Admitting: Podiatry

## 2018-06-28 ENCOUNTER — Ambulatory Visit (INDEPENDENT_AMBULATORY_CARE_PROVIDER_SITE_OTHER): Payer: Medicare Other | Admitting: Podiatry

## 2018-06-28 DIAGNOSIS — D689 Coagulation defect, unspecified: Secondary | ICD-10-CM | POA: Diagnosis not present

## 2018-06-28 DIAGNOSIS — E1140 Type 2 diabetes mellitus with diabetic neuropathy, unspecified: Secondary | ICD-10-CM

## 2018-06-28 DIAGNOSIS — M79676 Pain in unspecified toe(s): Secondary | ICD-10-CM

## 2018-06-28 DIAGNOSIS — B351 Tinea unguium: Secondary | ICD-10-CM

## 2018-06-28 NOTE — Progress Notes (Signed)
Patient ID: Christine Gay, female   DOB: 05/23/1941, 77 y.o.   MRN: [id number] Complaint:  Visit Type: Patient returns to my office for continued preventative foot care services. Complaint: Patient states" my nails have grown long and thick and become painful to walk and wear shoes" Patient has been diagnosed with DM with no foot complications. The patient presents for preventative foot care services. No changes to ROS  Podiatric Exam: Vascular: dorsalis pedis and posterior tibial pulses are palpable bilateral. Capillary return is immediate. Temperature gradient is WNL. Skin turgor WNL  Sensorium: Diminished  Semmes Weinstein monofilament test. Normal tactile sensation bilaterally. Nail Exam: Pt has thick disfigured discolored nails with subungual debris noted bilateral entire nail hallux through fifth toenails Ulcer Exam: There is no evidence of ulcer or pre-ulcerative changes or infection. Orthopedic Exam: Muscle tone and strength are WNL. No limitations in general ROM. No crepitus or effusions noted. Foot type and digits show no abnormalities. Bony prominences are unremarkable. HAV 1st  MPJ B/L.  Hammer toes 2 B/l Skin: No Porokeratosis. No infection or ulcers.  Asymptomatic Callus sub 5th B/L  Diagnosis:  Onychomycosis, , Pain in right toe, pain in left toes,   Treatment & Plan Procedures and Treatment: Consent by patient was obtained for treatment procedures. The patient understood the discussion of treatment and procedures well. All questions were answered thoroughly reviewed. Debridement of mycotic and hypertrophic toenails, 1 through 5 bilateral and clearing of subungual debris. No ulceration, no infection noted. Return Visit-Office Procedure: Patient instructed to return to the office for a follow up visit 3 months for continued evaluation and treatment.  Saveah Bahar DPM 

## 2018-07-11 ENCOUNTER — Ambulatory Visit (INDEPENDENT_AMBULATORY_CARE_PROVIDER_SITE_OTHER): Payer: Medicare Other | Admitting: Pharmacist

## 2018-07-11 DIAGNOSIS — Z5181 Encounter for therapeutic drug level monitoring: Secondary | ICD-10-CM

## 2018-07-11 DIAGNOSIS — I4891 Unspecified atrial fibrillation: Secondary | ICD-10-CM

## 2018-07-11 LAB — POCT INR: INR: 2.5 (ref 2.0–3.0)

## 2018-07-11 NOTE — Patient Instructions (Signed)
Description   Continue taking 1.5 tablets every day except 1 tablet on Tuesdays, Thursdays and Saturdays. Recheck in 3 weeks. Coumadin Clinic [phone] Main [phone]

## 2018-07-17 ENCOUNTER — Ambulatory Visit (INDEPENDENT_AMBULATORY_CARE_PROVIDER_SITE_OTHER): Payer: Medicare Other | Admitting: *Deleted

## 2018-07-17 DIAGNOSIS — I442 Atrioventricular block, complete: Secondary | ICD-10-CM | POA: Diagnosis not present

## 2018-07-17 NOTE — Progress Notes (Signed)
Remote pacemaker transmission.   

## 2018-07-18 ENCOUNTER — Encounter: Payer: Self-pay | Admitting: Cardiology

## 2018-07-19 ENCOUNTER — Other Ambulatory Visit: Payer: Self-pay | Admitting: Internal Medicine

## 2018-07-31 ENCOUNTER — Ambulatory Visit (INDEPENDENT_AMBULATORY_CARE_PROVIDER_SITE_OTHER): Payer: Medicare Other

## 2018-07-31 DIAGNOSIS — I4891 Unspecified atrial fibrillation: Secondary | ICD-10-CM | POA: Diagnosis not present

## 2018-07-31 DIAGNOSIS — Z5181 Encounter for therapeutic drug level monitoring: Secondary | ICD-10-CM

## 2018-07-31 LAB — POCT INR: INR: 2.5 (ref 2.0–3.0)

## 2018-07-31 NOTE — Patient Instructions (Signed)
Description   Continue on same dosage 1.5 tablets every day except 1 tablet on Tuesdays, Thursdays and Saturdays. Recheck in 4 weeks. [facility] [phone] Main [phone]

## 2018-08-07 NOTE — Progress Notes (Signed)
Electrophysiology Office Note Date: 08/08/2018  ID:  Christine Gay, DOB 04-09-1941, MRN [id number]  PCP: Christine Carol, MD Primary Cardiologist: Christine Gay Electrophysiologist: Christine Gay  CC: Pacemaker follow-up  Christine Gay is a 77 y.o. female seen today for Dr Christine Gay.  She presents today for routine electrophysiology followup.  Since last being seen in our clinic, the patient reports doing relatively well.  She is having fairly significant right shoulder pain with any movement. She reports a prior cortisone injection that helped significantly.   She denies chest pain, palpitations, dyspnea, PND, orthopnea, nausea, vomiting, dizziness, syncope, edema, weight gain, or early satiety.  Device History: STJ dual chamber PPM implanted 2018 for CHB   Past Medical History:  Diagnosis Date  . Anemia    a. Noted on labs 04/2013.  . Anginal pain ([place])   . Arthritis    "legs" (06/10/2014)  . CAD (coronary artery disease)    a. s/p CABG 1992, b. s/p PCI several years ago; c. LHC 11/08: L-LAD ok, S-OM1 ok, S-OM2 CTO, prox to mid CFX 70% (unchanged), RCA non-dominant, occluded;  d.  Echo 2/11: EF 55%, mod LVH, Ao sclerosis;  e. Echo 6/14: mild LVH, mild focal basal sept hypertrophy, EF 55-65%, inf HK, Mild AI, mild MR, mild BAE;  f. Myoview 7/14: int risk-> cath 04/2013 s/p PTCA/DES to prox Cx 05/04/13.  . Chronic diastolic CHF (congestive heart failure) ([place])   . GERD (gastroesophageal reflux disease)   . Glaucoma   . High cholesterol   . Hypertension   . Hypokalemia 02/2017  . Hypothyroidism   . LBBB (left bundle branch block)    chronic  . Myocardial infarction ([place]) 03/1991 X 2; 07/1991  . Obesity   . Seizures ([place])    "I'm on Dilantin" (06/10/2014)  . Sickle cell trait ([place])   . Type II diabetes mellitus ([place])    Past Surgical History:  Procedure Laterality Date  . CARDIAC CATHETERIZATION     "several;  they went up to check"  . CARDIAC CATHETERIZATION N/A 09/28/2016   Procedure:  Left Heart Cath and Coronary Angiography;  Surgeon: Christine M Martinique, MD;  Location: [place] [facility];  Service: Cardiovascular;  Laterality: N/A;  . CATARACT EXTRACTION W/ INTRAOCULAR LENS  IMPLANT, BILATERAL Bilateral   . CHOLECYSTECTOMY    . COLONOSCOPY N/A 06/12/2014   Procedure: COLONOSCOPY;  Surgeon: Christine Nipper, MD;  Location: [facility] ENDOSCOPY;  Service: Endoscopy;  Laterality: N/A;  . CORONARY ANGIOPLASTY WITH STENT PLACEMENT  ? date; 05/04/2013   ? location; DES  to [place]  . CORONARY ARTERY BYPASS GRAFT  07/1991   "CABG X 3"  . EP IMPLANTABLE DEVICE N/A 09/29/2016   Procedure: Pacemaker Implant;  Surgeon: Christine Christine Leeds, MD;  Location: [place] [facility];  Service: Cardiovascular;  Laterality: N/A;  . ESOPHAGOGASTRODUODENOSCOPY N/A 06/12/2014   Procedure: ESOPHAGOGASTRODUODENOSCOPY (EGD);  Surgeon: Christine Nipper, MD;  Location: [facility] ENDOSCOPY;  Service: Endoscopy;  Laterality: N/A;  . LEFT AND RIGHT HEART CATHETERIZATION WITH CORONARY ANGIOGRAM N/A 05/04/2013   Procedure: LEFT AND RIGHT HEART CATHETERIZATION WITH CORONARY ANGIOGRAM;  Surgeon: Christine Dresser, MD;  Location: [facility] CATH LAB;  Service: Cardiovascular;  Laterality: N/A;  . LEFT HEART CATH AND CORS/GRAFTS ANGIOGRAPHY N/A 03/22/2018   Procedure: LEFT HEART CATH AND CORS/GRAFTS ANGIOGRAPHY;  Surgeon: Christine Mocha, MD;  Location: [place] [facility];  Service: Cardiovascular;  Laterality: N/A;  . PERCUTANEOUS STENT INTERVENTION  05/04/2013   Procedure: PERCUTANEOUS STENT INTERVENTION;  Surgeon: Christine Dresser, MD;  Location: [facility] CATH LAB;  Service: Cardiovascular;;  . TUBAL LIGATION Bilateral 1969    Current Outpatient Medications  Medication Sig Dispense Refill  . acetaminophen (TYLENOL) 325 MG tablet Take 1-2 tablets (325-650 mg total) by mouth every 4 (four) hours as needed for mild pain.    . carvedilol (COREG) 6.25 MG tablet Take 0.5 tablets (3.125 mg total) by mouth 2 (two) times daily.    . cycloSPORINE (RESTASIS)  0.05 % ophthalmic emulsion Place 1 drop into both eyes 2 (two) times daily.    Christine Gay ezetimibe (ZETIA) 10 MG tablet TAKE 1 TABLET BY MOUTH DAILY. 90 tablet 3  . ferrous sulfate 325 (65 FE) MG tablet Take 325 mg by mouth daily with breakfast.    . furosemide (LASIX) 40 MG tablet TAKE 1 AND 1/2 TABLETS BY MOUTH 2 TIMES DAILY. 180 tablet 2  . glucose blood test strip Test blood glucose three times daily. 100 each 0  . ibandronate (BONIVA) 150 MG tablet Take 150 mg by mouth every 30 (thirty) days. Take in the morning with a full glass of water, on an empty stomach, and do not take anything else by mouth or lie down for the next 30 min.    . isosorbide mononitrate (IMDUR) 60 MG 24 hr tablet TAKE 1 TABLET BY MOUTH 2 TIMES DAILY. 180 tablet 1  . levothyroxine (SYNTHROID, LEVOTHROID) 100 MCG tablet Take 100 mcg by mouth daily before breakfast.    . metFORMIN (GLUCOPHAGE-XR) 500 MG 24 hr tablet Take 500 mg by mouth 2 (two) times daily.     . nitroGLYCERIN (NITROSTAT) 0.4 MG SL tablet PLACE 1 TABLET (0.4 MG TOTAL) UNDER THE TONGUE EVERY 5 (FIVE) MINUTES AS NEEDED FOR CHEST PAIN. 25 tablet 11  . pantoprazole (PROTONIX) 40 MG tablet Take 1 tablet (40 mg total) by mouth daily. 90 tablet 2  . phenytoin (DILANTIN) 100 MG ER capsule Take 100-200 mg by mouth 2 (two) times daily. 200 mg in the morning and 100 mg in the evening    . potassium chloride SA (K-DUR,KLOR-CON) 20 MEQ tablet TAKE 1 TABLET BY MOUTH 2 TIMES DAILY. 60 tablet 3  . rosuvastatin (CRESTOR) 40 MG tablet TAKE 1 TABLET BY MOUTH DAILY 90 tablet 2  . timolol (TIMOPTIC) 0.5 % ophthalmic solution Place 1 drop into both eyes daily.   6  . warfarin (COUMADIN) 5 MG tablet TAKE AS DIRECTED BY COUMADIN CLINIC (Patient taking differently: TAKE 7.5mg  on Fridays and 5mg  on all other days or AS DIRECTED BY COUMADIN CLINIC) 40 tablet 3   No current facility-administered medications for this visit.     Allergies:   Meperidine hcl; Nitroglycerin; and Morphine    Social History: Social History   Socioeconomic History  . Marital status: Married    Spouse name: Not on file  . Number of children: Not on file  . Years of education: Not on file  . Highest education level: Not on file  Occupational History  . Not on file  Social Needs  . Financial resource strain: Not on file  . Food insecurity:    Worry: Not on file    Inability: Not on file  . Transportation needs:    Medical: Not on file    Non-medical: Not on file  Tobacco Use  . Smoking status: Never Smoker  . Smokeless tobacco: Never Used  Substance and Sexual Activity  . Alcohol use: No  . Drug use: No  . Sexual  activity: Never  Lifestyle  . Physical activity:    Days per week: Not on file    Minutes per session: Not on file  . Stress: Not on file  Relationships  . Social connections:    Talks on phone: Not on file    Gets together: Not on file    Attends religious service: Not on file    Active member of club or organization: Not on file    Attends meetings of clubs or organizations: Not on file    Relationship status: Not on file  . Intimate partner violence:    Fear of current or ex partner: Not on file    Emotionally abused: Not on file    Physically abused: Not on file    Forced sexual activity: Not on file  Other Topics Concern  . Not on file  Social History Narrative  . Not on file    Family History: Family History  Problem Relation Age of Onset  . Diabetes Mother      Review of Systems: All other systems reviewed and are otherwise negative except as noted above.   Physical Exam: VS:  BP 112/66   Pulse 60   Ht 5' (1.524 m)   Wt 163 lb 12.8 oz (74.3 kg)   SpO2 98%   BMI 31.99 kg/m  , BMI Body mass index is 31.99 kg/m.  GEN- The patient is elderly appearing, alert and oriented x 3 today.   HEENT: normocephalic, atraumatic; sclera clear, conjunctiva pink; hearing intact; oropharynx clear; neck supple  Lungs- Clear to ausculation bilaterally,  normal work of breathing.  No wheezes, rales, rhonchi Heart- Regular rate and rhythm (paced) GI- soft, non-tender, non-distended, bowel sounds present  Extremities- no clubbing, cyanosis, or edema  MS- no significant deformity or atrophy Skin- warm and dry, no rash or lesion; PPM pocket well healed Psych- euthymic mood, full affect Neuro- strength and sensation are intact  PPM Interrogation- reviewed in detail today,  See PACEART report  EKG:  EKG is not ordered today.  Recent Labs: 03/18/2018: B Natriuretic Peptide 232.5 05/12/2018: BUN 10; Creatinine, Ser 0.85; Hemoglobin 11.5; Platelets 151; Potassium 4.6; Sodium 138   Wt Readings from Last 3 Encounters:  08/08/18 163 lb 12.8 oz (74.3 kg)  05/12/18 160 lb (72.6 kg)  03/23/18 156 lb 14.4 oz (71.2 kg)     Other studies Reviewed: Additional studies/ records that were reviewed today include: Dr Christine Gay and Dr Christine Gay' office notes   Assessment and Plan:  1.  Complete heart block Normal PPM function See Pace Art report No changes today  2.  Paroxysmal atrial fibrillation Burden by device interrogation <1% Continue Warfarin for CHADS2VASC of 5  3. CAD No recent ischemic symptoms Continue current therapy  4.  HTN Stable No change required today  5.  Chronic diastolic heart failure Stable No change required today  6.  Shoulder pain Clearly musculoskeletal in nature I have encouraged follow up with PCP     Current medicines are reviewed at length with the patient today.   The patient does not have concerns regarding her medicines.  The following changes were made today:  none  Labs/ tests ordered today include: none No orders of the defined types were placed in this encounter.    Disposition:   Follow up with Dr Christine Gay as scheduled, Delilah Shan, Dr Christine Gay 6 months   Signed, Chanetta Marshall, NP 08/08/2018 12:10 PM  [place]  [id number] [phone]  (office) [phone] (fax)

## 2018-08-08 ENCOUNTER — Ambulatory Visit (INDEPENDENT_AMBULATORY_CARE_PROVIDER_SITE_OTHER): Payer: Medicare Other | Admitting: Nurse Practitioner

## 2018-08-08 ENCOUNTER — Encounter: Payer: Self-pay | Admitting: Nurse Practitioner

## 2018-08-08 VITALS — BP 112/66 | HR 60 | Ht 60.0 in | Wt 163.8 lb

## 2018-08-08 DIAGNOSIS — I5032 Chronic diastolic (congestive) heart failure: Secondary | ICD-10-CM

## 2018-08-08 DIAGNOSIS — I480 Paroxysmal atrial fibrillation: Secondary | ICD-10-CM

## 2018-08-08 DIAGNOSIS — I2510 Atherosclerotic heart disease of native coronary artery without angina pectoris: Secondary | ICD-10-CM

## 2018-08-08 DIAGNOSIS — I10 Essential (primary) hypertension: Secondary | ICD-10-CM

## 2018-08-08 DIAGNOSIS — I442 Atrioventricular block, complete: Secondary | ICD-10-CM | POA: Diagnosis not present

## 2018-08-08 NOTE — Patient Instructions (Addendum)
Medication Instructions:  NONE ORDERED  TODAY  If you need a refill on your cardiac medications before your next appointment, please call your pharmacy.   Lab work: NONE ORDERED  TODAY  If you have labs (blood work) drawn today and your tests are completely normal, you will receive your results only by: Marland Kitchen MyChart Message (if you have MyChart) OR . A paper copy in the mail If you have any lab test that is abnormal or we need to change your treatment, we will call you to review the results.  Testing/Procedures: NONE ORDERED  TODAY   Follow-Up: At [facility], you and your health needs are our priority.  As part of our continuing mission to provide you with exceptional heart care, we have created designated Provider Care Teams.  These Care Teams include your primary Cardiologist (physician) and Advanced Practice Providers (APPs -  Physician Assistants and Nurse Practitioners) who all work together to provide you with the care you need, when you need it. You will need a follow up appointment in 6 months  Dr Curt Bears.  Please call our office 2 months in advance to schedule this appointment.  You may see one of the following Advanced Practice Providers on your designated Care Team:   Chanetta Marshall, NP . Tommye Standard, PA-C  Remote monitoring is used to monitor your Pacemaker of ICD from home. This monitoring reduces the number of office visits required to check your device to one time per year. It allows [place] to keep an eye on the functioning of your device to ensure it is working properly. You are scheduled for a device check from home on .10-16-2018 You may send your transmission at any time that day. If you have a wireless device, the transmission will be sent automatically. After your physician reviews your transmission, you will receive a postcard with your next transmission date.    Any Other Special Instructions Will Be Listed Below (If Applicable).

## 2018-08-14 ENCOUNTER — Ambulatory Visit (HOSPITAL_COMMUNITY)
Admission: RE | Admit: 2018-08-14 | Discharge: 2018-08-14 | Disposition: A | Discharge status: Home / Self Care | Payer: Medicare Other | Source: Ambulatory Visit | Attending: Cardiology | Admitting: Cardiology

## 2018-08-14 ENCOUNTER — Encounter (HOSPITAL_COMMUNITY): Payer: Self-pay | Admitting: Cardiology

## 2018-08-14 VITALS — BP 104/61 | HR 60 | Wt 165.6 lb

## 2018-08-14 DIAGNOSIS — E785 Hyperlipidemia, unspecified: Secondary | ICD-10-CM | POA: Diagnosis not present

## 2018-08-14 DIAGNOSIS — Z79899 Other long term (current) drug therapy: Secondary | ICD-10-CM | POA: Diagnosis not present

## 2018-08-14 DIAGNOSIS — G8929 Other chronic pain: Secondary | ICD-10-CM

## 2018-08-14 DIAGNOSIS — M25511 Pain in right shoulder: Secondary | ICD-10-CM | POA: Insufficient documentation

## 2018-08-14 DIAGNOSIS — I110 Hypertensive heart disease with heart failure: Secondary | ICD-10-CM | POA: Diagnosis not present

## 2018-08-14 DIAGNOSIS — Z79890 Hormone replacement therapy (postmenopausal): Secondary | ICD-10-CM | POA: Diagnosis not present

## 2018-08-14 DIAGNOSIS — I2510 Atherosclerotic heart disease of native coronary artery without angina pectoris: Secondary | ICD-10-CM | POA: Diagnosis not present

## 2018-08-14 DIAGNOSIS — I6523 Occlusion and stenosis of bilateral carotid arteries: Secondary | ICD-10-CM | POA: Insufficient documentation

## 2018-08-14 DIAGNOSIS — I442 Atrioventricular block, complete: Secondary | ICD-10-CM | POA: Insufficient documentation

## 2018-08-14 DIAGNOSIS — E119 Type 2 diabetes mellitus without complications: Secondary | ICD-10-CM | POA: Diagnosis not present

## 2018-08-14 DIAGNOSIS — K219 Gastro-esophageal reflux disease without esophagitis: Secondary | ICD-10-CM | POA: Insufficient documentation

## 2018-08-14 DIAGNOSIS — Z950 Presence of cardiac pacemaker: Secondary | ICD-10-CM | POA: Diagnosis not present

## 2018-08-14 DIAGNOSIS — G40909 Epilepsy, unspecified, not intractable, without status epilepticus: Secondary | ICD-10-CM | POA: Insufficient documentation

## 2018-08-14 DIAGNOSIS — E039 Hypothyroidism, unspecified: Secondary | ICD-10-CM | POA: Insufficient documentation

## 2018-08-14 DIAGNOSIS — I5032 Chronic diastolic (congestive) heart failure: Secondary | ICD-10-CM | POA: Insufficient documentation

## 2018-08-14 DIAGNOSIS — Z7901 Long term (current) use of anticoagulants: Secondary | ICD-10-CM | POA: Diagnosis not present

## 2018-08-14 DIAGNOSIS — M25512 Pain in left shoulder: Secondary | ICD-10-CM | POA: Diagnosis not present

## 2018-08-14 DIAGNOSIS — H409 Unspecified glaucoma: Secondary | ICD-10-CM | POA: Insufficient documentation

## 2018-08-14 DIAGNOSIS — Z951 Presence of aortocoronary bypass graft: Secondary | ICD-10-CM | POA: Insufficient documentation

## 2018-08-14 DIAGNOSIS — I480 Paroxysmal atrial fibrillation: Secondary | ICD-10-CM

## 2018-08-14 DIAGNOSIS — Z7984 Long term (current) use of oral hypoglycemic drugs: Secondary | ICD-10-CM | POA: Diagnosis not present

## 2018-08-14 DIAGNOSIS — Z955 Presence of coronary angioplasty implant and graft: Secondary | ICD-10-CM | POA: Diagnosis not present

## 2018-08-14 LAB — BASIC METABOLIC PANEL
Anion gap: 4 — ABNORMAL LOW (ref 5–15)
BUN: 10 mg/dL (ref 8–23)
CALCIUM: 8 mg/dL — AB (ref 8.9–10.3)
CO2: 25 mmol/L (ref 22–32)
CREATININE: 0.99 mg/dL (ref 0.44–1.00)
Chloride: 108 mmol/L (ref 98–111)
GFR calc non Af Amer: 54 mL/min — ABNORMAL LOW (ref >60)
Glucose, Bld: 115 mg/dL — ABNORMAL HIGH (ref 70–99)
Potassium: 4.3 mmol/L (ref 3.5–5.1)
Sodium: 137 mmol/L (ref 135–145)

## 2018-08-14 NOTE — Patient Instructions (Addendum)
Labs today We will only contact you if something comes back abnormal or we need to make some changes. Otherwise no news is good news!  You have been scheduled for your appointment at [facility].   Your physician recommends that you schedule a follow-up appointment in: 4 months with Dr Aundra Dubin

## 2018-08-15 NOTE — Progress Notes (Signed)
Patient ID: Christine Gay, female   DOB: 22-Nov-1940, 77 y.o.   MRN: [id number] PCP: Dr. Delfina Redwood Cardiology: Dr. Aundra Dubin  77 y.o. with history of CAD s/p CABG, DM, and HTN presents for followup of CHF and CAD.  She had a Lexiscan Cardiolite in 2014 that was an intermediate risk study and echo showed basal to mid inferior hypokinesis with EF 55-60%.  I did a cardaic catheterization in 8/14 that showed patent LIMA-LAD, occluded SVG-OM1, and 95% ostial LCx stenosis.  She had PCI to ostial LCx with Promus DES.  Cardiolite in 5/15 showed no ischemia or infarction.  She had an upper GI bleed in 9/15, she is now off Plavix.   In 1/18, she was admitted with severe fatigue and dyspnea and was found to be in complete heart block.  Troponin was up to 4.9.   She had LHC showing patent LIMA-LAD and SVG-OM1, patent LCx stent, and occluded SVG-OM2 (known from past), no evidence for plaque rupture/new disease. Echo showed EF stable at 50-55%.  She had St Jude PPM placed.   She was started on warfarin due to paroxysmal atrial fibrillation noted by device interrogation.   She was admitted with chest pain in 6/19, concerning for unstable angina.  LHC showed stable anatomy compared to 1/18 cath. Echo showed EF 50-55%.   She is doing well currently. She has some trouble with balance and is using a walker most of the time. She has right shoulder pain that has been bothering her a lot.  She asks about having a steroid injection.  No chest pain.  She is not short of breath walking at a slow, steady pace.  No orthopnea/PND.   ECG (personally reviewed): A-paced, RV paced.    Device interrogation: 42% a-paced, >99% Vpaced, no atrial fibrillation.   Labs (3/13): K 4.2, creatinine 0.77 Labs (6/14): LDL 80, HDL 69 Labs (8/14): BNP 971 => 420 => 138, creatinine 0.8, K 4.3 Labs (11/14): K 4.1, creatinine 0.8, BNP 88 Labs (3/15): K 3.4, creatinine 0.86, LDL 85 Labs (9/15): K 3.2, creatinine 0.63, hgb 8 Labs (1/16): LDL 96 Labs  (2/16): K 4.2, creatinine 0.89 Labs (3/16): LDL 91, HDL 56 Labs (7/16): TSH normal, K 4.7, creatinine 0.9, hgb 11.4, LDL 93 Labs (8/17): LDL 83, HDL 62 Labs (1/18): K 4, creatinine 1.09, hgb 12.3 Labs (2/18): K 4.2, creatinine 0.81, BNP 163 Labs (3/18): K 3.9, creatinine 0.96 Labs (6/18): K 3.9, creatinine 0.86, hgb 11.2 Labs (6/19): K 3.7, creatinine 0.88, LDL 63, HDL 37 Labs (8/19): K 4.6, creatinine 0.85, hgb 11.5  PMH: 1. GERD 2. Seizure disorder 3. Type II diabetes 4. Chronic LBBB 5. CAD: s/p CABG in 1992, had PCI several years ago.  Echo (2/11) with EF 55%, moderate LVH, aortic sclerosis.  LHC (8/14) with patent LIMA-LAD, totally occluded SVG-OM1, 95% ostial LCx, totally occluded small nondominant RCA with EF 55%.  Patient had Promus DES to pLCx. Lexiscan Cardiolite (5/15) with EF 57%, mild breast attenuation, no ischemia or infarction.  Lexiscan Cardiolite (2/16) with EF 44%, small partially reversible mid anterior perfusion defect may be due to soft tissue attenuation.   - LHC (1/18): patent LIMA-LAD, totally occluded LAD, patent SVG-OM1, totally occluded OM2 and SVG-OM2, patent stent in LCx, totally occluded nondominant RCA with right->right collaterals.  - LHC (6/19): Same as 1/18. 6. Glaucoma 7. Hypothyroidism 8. HTN 9. Cholecystectomy 10. Diastolic CHF: Echo (6/73) with EF 55-60%, basal to mid inferior hypokinesis, mild MR and mild AI. Echo (2/16)  with EF 55-60%, mild LAE. - Echo (1/18): EF 50-55%, mild LVH, mildly decreased RV systolic function, mild AI.   - Echo (6/19): EF 50-55%, basal inferolateral HK, mild MR, mild AI.  11. Carotid stenosis: Carotid dopplers (6/15) with 40-59% bilateral ICA stenosis.  - Carotid dopplers (5/63) with 87-56% LICA stenosis.  - Carotid dopplers (5/18): Mild disease bilaterally.  - Carotid dopplers (5/19): Mild disease bilaterally 12. GI bleed 9/15: EGD showed a polypoid gastric lesion that was friable, benign pathology.  13. Complete heart  block: St Jude PPM in 1/18.  14. Atrial fibrillation: Paroxysmal.   SH: Widow, lives in [place], does not smoke.   FH: No premature CAD.  Diabetes.   ROS: All systems reviewed and negative except as per HPI.   Current Outpatient Medications  Medication Sig Dispense Refill  . acetaminophen (TYLENOL) 325 MG tablet Take 1-2 tablets (325-650 mg total) by mouth every 4 (four) hours as needed for mild pain.    . carvedilol (COREG) 6.25 MG tablet Take 0.5 tablets (3.125 mg total) by mouth 2 (two) times daily.    . cycloSPORINE (RESTASIS) 0.05 % ophthalmic emulsion Place 1 drop into both eyes 2 (two) times daily.    Marland Kitchen ezetimibe (ZETIA) 10 MG tablet TAKE 1 TABLET BY MOUTH DAILY. 90 tablet 3  . ferrous sulfate 325 (65 FE) MG tablet Take 325 mg by mouth daily with breakfast.    . furosemide (LASIX) 40 MG tablet TAKE 1 AND 1/2 TABLETS BY MOUTH 2 TIMES DAILY. 180 tablet 2  . glucose blood test strip Test blood glucose three times daily. 100 each 0  . ibandronate (BONIVA) 150 MG tablet Take 150 mg by mouth every 30 (thirty) days. Take in the morning with a full glass of water, on an empty stomach, and do not take anything else by mouth or lie down for the next 30 min.    Marland Kitchen levothyroxine (SYNTHROID, LEVOTHROID) 100 MCG tablet Take 100 mcg by mouth daily before breakfast.    . metFORMIN (GLUCOPHAGE-XR) 500 MG 24 hr tablet Take 500 mg by mouth 2 (two) times daily.     . nitroGLYCERIN (NITROSTAT) 0.4 MG SL tablet PLACE 1 TABLET (0.4 MG TOTAL) UNDER THE TONGUE EVERY 5 (FIVE) MINUTES AS NEEDED FOR CHEST PAIN. 25 tablet 11  . pantoprazole (PROTONIX) 40 MG tablet Take 1 tablet (40 mg total) by mouth daily. 90 tablet 2  . phenytoin (DILANTIN) 100 MG ER capsule Take 100-200 mg by mouth 2 (two) times daily. 200 mg in the morning and 100 mg in the evening    . potassium chloride SA (K-DUR,KLOR-CON) 20 MEQ tablet TAKE 1 TABLET BY MOUTH 2 TIMES DAILY. 60 tablet 3  . rosuvastatin (CRESTOR) 40 MG tablet TAKE 1 TABLET  BY MOUTH DAILY 90 tablet 2  . timolol (TIMOPTIC) 0.5 % ophthalmic solution Place 1 drop into both eyes daily.   6  . isosorbide mononitrate (IMDUR) 60 MG 24 hr tablet TAKE 1 TABLET BY MOUTH 2 TIMES DAILY. 180 tablet 1  . warfarin (COUMADIN) 5 MG tablet TAKE AS DIRECTED BY COUMADIN CLINIC (Patient taking differently: TAKE 7.5mg  on Fridays and 5mg  on all other days or AS DIRECTED BY COUMADIN CLINIC) 40 tablet 3   No current facility-administered medications for this encounter.     BP 104/61   Pulse 60   Wt 75.1 kg (165 lb 9.6 oz)   SpO2 99%   BMI 32.34 kg/m  General: NAD Neck: No JVD, no thyromegaly or  thyroid nodule.  Lungs: Clear to auscultation bilaterally with normal respiratory effort. CV: Nondisplaced PMI.  Heart regular S1/S2, no S3/S4, no murmur.  Trace ankle edema.  No carotid bruit.  Normal pedal pulses.  Abdomen: Soft, nontender, no hepatosplenomegaly, no distention.  Skin: Intact without lesions or rashes.  Neurologic: Alert and oriented x 3.  Psych: Normal affect. Extremities: No clubbing or cyanosis.  HEENT: Normal.   Assessment/Plan: 1. CAD: Status post Promus DES to ostial LCx in 8/14.  Last cath in 6/19 showed stable anatomy.  No chest pain.  - Continue Imdur and Coreg at current doses.  - She is not a ranolazine candidate given use of Dilantin.  - Continue ASA 81 and statin/Zetia.  2. Diastolic CHF: EF 36-64% on 6/19 echo.  NYHA class II. Not volume overloaded on exam.  - Continue Lasix 60 mg bid.  BMET today.  3. Hyperlipidemia: She is on Zetia and Crestor. Good lipids 6/19.  4. Carotid bruit: Mild disease on carotid dopplers in 5/19.  5. H/o GI bleeding: No melena or hematochezia.  6. Complete heart block: S/p St Jude PPM, device functioning appropriately.  7. Paroxysmal atrial fibrillation: She is not in afib today.  Continue warfarin.  8. Shoulder pain: I will refer to orthopedics for evaluation.   Followup in 4 months.   [name] 08/15/2018

## 2018-08-17 ENCOUNTER — Other Ambulatory Visit (HOSPITAL_COMMUNITY): Payer: Self-pay | Admitting: Cardiology

## 2018-08-21 ENCOUNTER — Encounter (INDEPENDENT_AMBULATORY_CARE_PROVIDER_SITE_OTHER): Payer: Self-pay | Admitting: Orthopedic Surgery

## 2018-08-21 ENCOUNTER — Ambulatory Visit (INDEPENDENT_AMBULATORY_CARE_PROVIDER_SITE_OTHER): Payer: Medicare Other | Admitting: Orthopedic Surgery

## 2018-08-21 ENCOUNTER — Ambulatory Visit (INDEPENDENT_AMBULATORY_CARE_PROVIDER_SITE_OTHER): Payer: Self-pay

## 2018-08-21 DIAGNOSIS — M7551 Bursitis of right shoulder: Secondary | ICD-10-CM | POA: Diagnosis not present

## 2018-08-21 DIAGNOSIS — M25511 Pain in right shoulder: Secondary | ICD-10-CM

## 2018-08-21 MED ORDER — LIDOCAINE HCL 1 % IJ SOLN
5.0000 mL | INTRAMUSCULAR | Status: AC | PRN
  Administered 2018-08-21: 5 mL

## 2018-08-21 MED ORDER — BUPIVACAINE HCL 0.5 % IJ SOLN
9.0000 mL | INTRAMUSCULAR | Status: AC | PRN
  Administered 2018-08-21: 9 mL via INTRA_ARTICULAR

## 2018-08-21 MED ORDER — METHYLPREDNISOLONE ACETATE 40 MG/ML IJ SUSP
40.0000 mg | INTRAMUSCULAR | Status: AC | PRN
  Administered 2018-08-21: 40 mg via INTRA_ARTICULAR

## 2018-08-21 NOTE — Progress Notes (Addendum)
Office Visit Note   Patient: Christine Gay           Date of Birth: 05-03-1941           MRN: [id number] Visit Date: 08/21/2018 Requested by: Christine Carol, MD [address], [place] [address] PCP: Christine Carol, MD  Subjective: Chief Complaint  Patient presents with  . Right Shoulder - Pain    HPI: Christine Gay is a patient with right shoulder pain.  She is been having some increasing pain since September.  Denies a history of injury.  Denies any radicular pain but the pain does radiate to the elbow.  She also reports some pain in the axillary region.  Hurts for her to write at times.  Takes Tylenol and ibuprofen for symptoms.  She has had one injection in the shoulder several months ago which helped.  She has an extensive cardiac history.  She is on Coumadin.              ROS: All systems reviewed are negative as they relate to the chief complaint within the history of present illness.  Patient denies  fevers or chills.   Assessment & Plan: Visit Diagnoses:  1. Right shoulder pain, unspecified chronicity     Plan: Impression is right shoulder pain which looks like bursitis.  She has pretty good rotator cuff strength and not much in the way of coarse grinding or crepitus with passive range of motion of that shoulder.  Her pain currently does not really reach the threshold for any type of operative intervention and therefore subacromial injection is indicated again.  We will see her back in 8 weeks and decide then if we need to do any type of CT arthrogram to evaluate the rotator cuff more fully.  Not much in the way of weakness on exam today.  Follow-Up Instructions: Return in about 8 weeks (around 10/16/2018).   Orders:  Orders Placed This Encounter  Procedures  . XR Shoulder Right   No orders of the defined types were placed in this encounter.     Procedures: Large Joint Inj: R subacromial bursa on 08/21/2018 10:24 PM Indications: diagnostic evaluation  and pain Details: 18 G 1.5 in needle, posterior approach  Arthrogram: No  Medications: 9 mL bupivacaine 0.5 %; 40 mg methylPREDNISolone acetate 40 MG/ML; 5 mL lidocaine 1 % Outcome: tolerated well, no immediate complications Procedure, treatment alternatives, risks and benefits explained, specific risks discussed. Consent was given by the patient. Immediately prior to procedure a time out was called to verify the correct patient, procedure, equipment, support staff and site/side marked as required. Patient was prepped and draped in the usual sterile fashion.       Clinical Data: No additional findings.  Objective: Vital Signs: There were no vitals taken for this visit.  Physical Exam:   Constitutional: Patient appears well-developed HEENT:  Head: Normocephalic Eyes:EOM are normal Neck: Normal range of motion Cardiovascular: Normal rate Pulmonary/chest: Effort normal Neurologic: Patient is alert Skin: Skin is warm Psychiatric: Patient has normal mood and affect    Ortho Exam: Ortho exam demonstrates good cervical spine range of motion with 5 out of 5 grip EPL FPL interosseous wrist flexion wrist extension bicep triceps and deltoid strength.  Radial pulse intact bilaterally.  No other masses lymphadenopathy or skin changes noted in that neck or shoulder region.  No limitation of passive motion in that right shoulder.  Good rotator cuff strength testing isolated infraspinatus supraspinatus and  subscap muscle testing.  No AC joint tenderness on the right-hand side.  Specialty Comments:  No specialty comments available.  Imaging: Xr Shoulder Right  Result Date: 08/21/2018 AP outlet and axillary right shoulder reviewed.  Shoulder is located.  Degenerative changes noted in the [facility] joint.  Enthesopathic rotator cuff attachment changes noted at the greater tuberosity.  No glenohumeral arthritis is present.  Acromiohumeral distance is maintained.  No fractures.    PMFS  History: Patient Active Problem List   Diagnosis Date Noted  . Chronic anticoagulation 03/23/2018  . Pacemaker 03/23/2018  . Abnormal nuclear cardiac imaging test   . NSTEMI (non-ST elevated myocardial infarction) ([place]) 03/18/2018  . Shoulder pain, left 03/09/2017  . Seizure ([place])   . Chest pain 03/08/2017  . Muscle spasm 03/08/2017  . Hypocalcemia 03/08/2017  . Encounter for therapeutic drug monitoring 02/08/2017  . Atrial fibrillation ([place]) 02/08/2017  . Complete heart block ([place]) 09/28/2016  . Third degree heart block ([place])   . Unstable angina ([place])   . CAD (coronary artery disease) 11/11/2014  . Dizziness 07/29/2014  . GI bleed 06/12/2014  . GERD (gastroesophageal reflux disease) 06/11/2014  . Type II or unspecified type diabetes mellitus without mention of complication, not stated as uncontrolled 06/11/2014  . Glaucoma 06/11/2014  . Microcytic hypochromic anemia 06/10/2014  . Chronic diastolic CHF (congestive heart failure) ([place]) 08/09/2013  . Hypotension 05/18/2013  . Exertional dyspnea 03/15/2013  . CHF (congestive heart failure) ([place]) 03/15/2013  . MURMUR 03/02/2010  . Hypothyroidism 03/20/2009  . HYPERCHOLESTEROLEMIA  IIA 03/20/2009  . HYPOKALEMIA 03/20/2009  . HYPERTENSION, BENIGN 03/20/2009  . CAD, ARTERY BYPASS GRAFT 03/20/2009   Past Medical History:  Diagnosis Date  . Anemia    a. Noted on labs 04/2013.  . Anginal pain ([place])   . Arthritis    "legs" (06/10/2014)  . CAD (coronary artery disease)    a. s/p CABG 1992, b. s/p PCI several years ago; c. LHC 11/08: L-LAD ok, S-OM1 ok, S-OM2 CTO, prox to mid CFX 70% (unchanged), RCA non-dominant, occluded;  d.  Echo 2/11: EF 55%, mod LVH, Ao sclerosis;  e. Echo 6/14: mild LVH, mild focal basal sept hypertrophy, EF 55-65%, inf HK, Mild AI, mild MR, mild BAE;  f. Myoview 7/14: int risk-> cath 04/2013 s/p PTCA/DES to prox Cx 05/04/13.  . Chronic diastolic CHF (congestive heart failure) ([place])   . GERD (gastroesophageal reflux  disease)   . Glaucoma   . High cholesterol   . Hypertension   . Hypokalemia 02/2017  . Hypothyroidism   . LBBB (left bundle branch block)    chronic  . Myocardial infarction ([place]) 03/1991 X 2; 07/1991  . Obesity   . Seizures ([employer])    "I'm on Dilantin" (06/10/2014)  . Sickle cell trait ([place])   . Type II diabetes mellitus (HCC)     Family History  Problem Relation Age of Onset  . Diabetes Mother     Past Surgical History:  Procedure Laterality Date  . CARDIAC CATHETERIZATION     "several;  they went up to check"  . CARDIAC CATHETERIZATION N/A 09/28/2016   Procedure: Left Heart Cath and Coronary Angiography;  Surgeon: Peter M Martinique, MD;  Location: [place] CV LAB;  Service: Cardiovascular;  Laterality: N/A;  . CATARACT EXTRACTION W/ INTRAOCULAR LENS  IMPLANT, BILATERAL Bilateral   . CHOLECYSTECTOMY    . COLONOSCOPY N/A 06/12/2014   Procedure: COLONOSCOPY;  Surgeon: Cleotis Nipper, MD;  Location: [facility] ENDOSCOPY;  Service: Endoscopy;  Laterality: N/A;  . CORONARY ANGIOPLASTY WITH STENT PLACEMENT  ? date; 05/04/2013   ? location; DES  to [place]  . CORONARY ARTERY BYPASS GRAFT  07/1991   "CABG X 3"  . EP IMPLANTABLE DEVICE N/A 09/29/2016   Procedure: Pacemaker Implant;  Surgeon: Will Meredith Leeds, MD;  Location: [place] CV LAB;  Service: Cardiovascular;  Laterality: N/A;  . ESOPHAGOGASTRODUODENOSCOPY N/A 06/12/2014   Procedure: ESOPHAGOGASTRODUODENOSCOPY (EGD);  Surgeon: Cleotis Nipper, MD;  Location: [facility] ENDOSCOPY;  Service: Endoscopy;  Laterality: N/A;  . LEFT AND RIGHT HEART CATHETERIZATION WITH CORONARY ANGIOGRAM N/A 05/04/2013   Procedure: LEFT AND RIGHT HEART CATHETERIZATION WITH CORONARY ANGIOGRAM;  Surgeon: Larey Dresser, MD;  Location: [facility] CATH LAB;  Service: Cardiovascular;  Laterality: N/A;  . LEFT HEART CATH AND CORS/GRAFTS ANGIOGRAPHY N/A 03/22/2018   Procedure: LEFT HEART CATH AND CORS/GRAFTS ANGIOGRAPHY;  Surgeon: Sherren Mocha, MD;  Location: [place] CV  LAB;  Service: Cardiovascular;  Laterality: N/A;  . PERCUTANEOUS STENT INTERVENTION  05/04/2013   Procedure: PERCUTANEOUS STENT INTERVENTION;  Surgeon: Larey Dresser, MD;  Location: [facility] CATH LAB;  Service: Cardiovascular;;  . TUBAL LIGATION Bilateral 1969   Social History   Occupational History  . Not on file  Tobacco Use  . Smoking status: Never Smoker  . Smokeless tobacco: Never Used  Substance and Sexual Activity  . Alcohol use: No  . Drug use: No  . Sexual activity: Never

## 2018-08-28 ENCOUNTER — Ambulatory Visit (INDEPENDENT_AMBULATORY_CARE_PROVIDER_SITE_OTHER): Payer: Medicare Other

## 2018-08-28 DIAGNOSIS — I4891 Unspecified atrial fibrillation: Secondary | ICD-10-CM | POA: Diagnosis not present

## 2018-08-28 DIAGNOSIS — Z5181 Encounter for therapeutic drug level monitoring: Secondary | ICD-10-CM

## 2018-08-28 LAB — POCT INR: INR: 2.2 (ref 2.0–3.0)

## 2018-08-28 NOTE — Patient Instructions (Signed)
Description   Continue on same dosage 1.5 tablets every day except 1 tablet on Tuesdays, Thursdays and Saturdays. Recheck in 5 weeks. Coumadin Clinic [phone] Main [phone]

## 2018-09-06 ENCOUNTER — Ambulatory Visit: Payer: Medicare Other | Admitting: Podiatry

## 2018-09-11 ENCOUNTER — Other Ambulatory Visit: Payer: Self-pay | Admitting: Cardiology

## 2018-09-13 ENCOUNTER — Ambulatory Visit: Payer: Medicare Other | Admitting: Podiatry

## 2018-09-15 ENCOUNTER — Other Ambulatory Visit: Payer: Self-pay | Admitting: Cardiology

## 2018-09-17 LAB — CUP PACEART REMOTE DEVICE CHECK
Battery Remaining Percentage: 95.5 %
Battery Voltage: 3.01 V
Brady Statistic AP VP Percent: 20 %
Brady Statistic AP VS Percent: 1 %
Brady Statistic RA Percent Paced: 19 %
Brady Statistic RV Percent Paced: 99 %
Date Time Interrogation Session: 20191021060013
Implantable Lead Implant Date: 20180103
Implantable Lead Location: 753860
Implantable Pulse Generator Implant Date: 20180103
Lead Channel Impedance Value: 480 Ohm
Lead Channel Pacing Threshold Amplitude: 0.375 V
Lead Channel Pacing Threshold Amplitude: 0.5 V
Lead Channel Pacing Threshold Pulse Width: 0.5 ms
Lead Channel Pacing Threshold Pulse Width: 0.5 ms
Lead Channel Setting Pacing Pulse Width: 0.5 ms
Lead Channel Setting Sensing Sensitivity: 4 mV
MDC IDC LEAD IMPLANT DT: 20180103
MDC IDC LEAD LOCATION: 753859
MDC IDC MSMT BATTERY REMAINING LONGEVITY: 130 mo
MDC IDC MSMT LEADCHNL RA IMPEDANCE VALUE: 430 Ohm
MDC IDC MSMT LEADCHNL RA SENSING INTR AMPL: 3.9 mV
MDC IDC MSMT LEADCHNL RV SENSING INTR AMPL: 12 mV
MDC IDC PG SERIAL: 7988263
MDC IDC SET LEADCHNL RA PACING AMPLITUDE: 1.5 V
MDC IDC SET LEADCHNL RV PACING AMPLITUDE: 0.625
MDC IDC STAT BRADY AS VP PERCENT: 79 %
MDC IDC STAT BRADY AS VS PERCENT: 1 %

## 2018-09-21 ENCOUNTER — Other Ambulatory Visit: Payer: Self-pay | Admitting: Internal Medicine

## 2018-09-26 ENCOUNTER — Ambulatory Visit (INDEPENDENT_AMBULATORY_CARE_PROVIDER_SITE_OTHER): Payer: Medicare Other | Admitting: Podiatry

## 2018-09-26 ENCOUNTER — Encounter: Payer: Self-pay | Admitting: Podiatry

## 2018-09-26 DIAGNOSIS — B351 Tinea unguium: Secondary | ICD-10-CM

## 2018-09-26 DIAGNOSIS — D689 Coagulation defect, unspecified: Secondary | ICD-10-CM | POA: Diagnosis not present

## 2018-09-26 DIAGNOSIS — E1140 Type 2 diabetes mellitus with diabetic neuropathy, unspecified: Secondary | ICD-10-CM | POA: Diagnosis not present

## 2018-09-26 DIAGNOSIS — M79676 Pain in unspecified toe(s): Secondary | ICD-10-CM

## 2018-09-26 NOTE — Progress Notes (Signed)
Patient ID: Christine Gay, female   DOB: 1941/01/22, 77 y.o.   MRN: [id number] Complaint:  Visit Type: Patient returns to my office for continued preventative foot care services. Complaint: Patient states" my nails have grown long and thick and become painful to walk and wear shoes" Patient has been diagnosed with DM with no foot complications. The patient presents for preventative foot care services. No changes to ROS  Podiatric Exam: Vascular: dorsalis pedis and posterior tibial pulses are palpable bilateral. Capillary return is immediate. Temperature gradient is WNL. Skin turgor WNL  Sensorium: Diminished  Semmes Weinstein monofilament test. Normal tactile sensation bilaterally. Nail Exam: Pt has thick disfigured discolored nails with subungual debris noted bilateral entire nail hallux through fifth toenails Ulcer Exam: There is no evidence of ulcer or pre-ulcerative changes or infection. Orthopedic Exam: Muscle tone and strength are WNL. No limitations in general ROM. No crepitus or effusions noted. Foot type and digits show no abnormalities. Bony prominences are unremarkable. HAV 1st  MPJ B/L.  Hammer toes 2 B/l Skin: No Porokeratosis. No infection or ulcers.  Asymptomatic Callus sub 5th B/L  Diagnosis:  Onychomycosis, , Pain in right toe, pain in left toes,   Treatment & Plan Procedures and Treatment: Consent by patient was obtained for treatment procedures. The patient understood the discussion of treatment and procedures well. All questions were answered thoroughly reviewed. Debridement of mycotic and hypertrophic toenails, 1 through 5 bilateral and clearing of subungual debris. No ulceration, no infection noted. Return Visit-Office Procedure: Patient instructed to return to the office for a follow up visit 3 months for continued evaluation and treatment.  Gardiner Barefoot DPM

## 2018-10-10 ENCOUNTER — Encounter (INDEPENDENT_AMBULATORY_CARE_PROVIDER_SITE_OTHER): Payer: Self-pay

## 2018-10-10 ENCOUNTER — Ambulatory Visit (INDEPENDENT_AMBULATORY_CARE_PROVIDER_SITE_OTHER): Payer: Medicare Other

## 2018-10-10 DIAGNOSIS — Z5181 Encounter for therapeutic drug level monitoring: Secondary | ICD-10-CM | POA: Diagnosis not present

## 2018-10-10 DIAGNOSIS — I4891 Unspecified atrial fibrillation: Secondary | ICD-10-CM | POA: Diagnosis not present

## 2018-10-10 LAB — POCT INR: INR: 2.4 (ref 2.0–3.0)

## 2018-10-10 NOTE — Patient Instructions (Signed)
Continue on same dosage 1.5 tablets every day except 1 tablet on Tuesdays, Thursdays and Saturdays. Recheck in 6 weeks. Coumadin Clinic [phone] Main [phone]

## 2018-10-16 ENCOUNTER — Ambulatory Visit (INDEPENDENT_AMBULATORY_CARE_PROVIDER_SITE_OTHER): Payer: Medicare Other

## 2018-10-16 ENCOUNTER — Encounter (INDEPENDENT_AMBULATORY_CARE_PROVIDER_SITE_OTHER): Payer: Self-pay | Admitting: Orthopedic Surgery

## 2018-10-16 ENCOUNTER — Ambulatory Visit (INDEPENDENT_AMBULATORY_CARE_PROVIDER_SITE_OTHER): Payer: Medicare Other | Admitting: Orthopedic Surgery

## 2018-10-16 VITALS — Ht 60.0 in | Wt 163.0 lb

## 2018-10-16 DIAGNOSIS — I442 Atrioventricular block, complete: Secondary | ICD-10-CM | POA: Diagnosis not present

## 2018-10-16 DIAGNOSIS — G8929 Other chronic pain: Secondary | ICD-10-CM | POA: Diagnosis not present

## 2018-10-16 DIAGNOSIS — M7551 Bursitis of right shoulder: Secondary | ICD-10-CM

## 2018-10-16 DIAGNOSIS — M25511 Pain in right shoulder: Secondary | ICD-10-CM

## 2018-10-16 NOTE — Progress Notes (Signed)
Office Visit Note   Patient: Christine Gay           Date of Birth: 1941/01/03           MRN: [id number] Visit Date: 10/16/2018 Requested by: Seward Carol, MD [address] [place] [address] [place], [place] [address] PCP: Seward Carol, MD  Subjective: Chief Complaint  Patient presents with  . Right Shoulder - Pain, Follow-up    HPI: Christine Gay is a 78 year old patient with right shoulder pain.  Had injection in November 2019.  Did well for like 2 to 3 days and then after that pain recurred.  She localizes the pain to the deltoid and biceps region.  Denies any neck pain or numbness and tingling.  Pain does radiate to the elbow but not below the elbow.  She does have a pacemaker.              ROS: All systems reviewed are negative as they relate to the chief complaint within the history of present illness.  Patient denies  fevers or chills.   Assessment & Plan: Visit Diagnoses:  1. Bursitis of right shoulder     Plan: Impression is right shoulder at least bursitis possibly rotator cuff pathology and/or biceps tendon pathology.  She would consider intervention for the symptoms that she is having now.  She does have symptoms on a daily basis.  Hard for her to pull herself up into the bus.  Exam is really nondiagnostic today.  She is had an injection.  I think CT arthrogram is indicated to see if there is potentially something operative in the shoulder that could be addressed.  I will see her back after that study  Follow-Up Instructions: No follow-ups on file.   Orders:  No orders of the defined types were placed in this encounter.  No orders of the defined types were placed in this encounter.     Procedures: No procedures performed   Clinical Data: No additional findings.  Objective: Vital Signs: Ht 5' (1.524 m)   Wt 163 lb (73.9 kg)   BMI 31.83 kg/m   Physical Exam:   Constitutional: Patient appears well-developed HEENT:  Head: Normocephalic Eyes:EOM are  normal Neck: Normal range of motion Cardiovascular: Normal rate Pulmonary/chest: Effort normal Neurologic: Patient is alert Skin: Skin is warm Psychiatric: Patient has normal mood and affect    Ortho Exam: Ortho exam demonstrates good rotator cuff strength isolated infraspinatus supraspinatus and subscap muscle testing.  No real limitation of external rotation of 15 degrees of abduction right versus left.  No discrete AC joint tenderness is present.  No masses lymphadenopathy or skin changes noted in the shoulder region.  She has good cervical spine range of motion.  Palpable radial pulses.  Specialty Comments:  No specialty comments available.  Imaging: No results found.   PMFS History: Patient Active Problem List   Diagnosis Date Noted  . Chronic anticoagulation 03/23/2018  . Pacemaker 03/23/2018  . Abnormal nuclear cardiac imaging test   . NSTEMI (non-ST elevated myocardial infarction) ([place]) 03/18/2018  . Shoulder pain, left 03/09/2017  . Seizure ([place])   . Chest pain 03/08/2017  . Muscle spasm 03/08/2017  . Hypocalcemia 03/08/2017  . Encounter for therapeutic drug monitoring 02/08/2017  . Atrial fibrillation ([place]) 02/08/2017  . Complete heart block ([place]) 09/28/2016  . Third degree heart block ([place])   . Unstable angina ([place])   . CAD (coronary artery disease) 11/11/2014  . Dizziness 07/29/2014  . GI bleed 06/12/2014  .  GERD (gastroesophageal reflux disease) 06/11/2014  . Type II or unspecified type diabetes mellitus without mention of complication, not stated as uncontrolled 06/11/2014  . Glaucoma 06/11/2014  . Microcytic hypochromic anemia 06/10/2014  . Chronic diastolic CHF (congestive heart failure) ([place]) 08/09/2013  . Hypotension 05/18/2013  . Exertional dyspnea 03/15/2013  . CHF (congestive heart failure) ([place]) 03/15/2013  . MURMUR 03/02/2010  . Hypothyroidism 03/20/2009  . HYPERCHOLESTEROLEMIA  IIA 03/20/2009  . HYPOKALEMIA 03/20/2009  . HYPERTENSION, BENIGN  03/20/2009  . CAD, ARTERY BYPASS GRAFT 03/20/2009   Past Medical History:  Diagnosis Date  . Anemia    a. Noted on labs 04/2013.  . Anginal pain ([place])   . Arthritis    "legs" (06/10/2014)  . CAD (coronary artery disease)    a. s/p CABG 1992, b. s/p PCI several years ago; c. LHC 11/08: L-LAD ok, S-OM1 ok, S-OM2 CTO, prox to mid CFX 70% (unchanged), RCA non-dominant, occluded;  d.  Echo 2/11: EF 55%, mod LVH, Ao sclerosis;  e. Echo 6/14: mild LVH, mild focal basal sept hypertrophy, EF 55-65%, inf HK, Mild AI, mild MR, mild BAE;  f. Myoview 7/14: int risk-> cath 04/2013 s/p PTCA/DES to prox Cx 05/04/13.  . Chronic diastolic CHF (congestive heart failure) ([place])   . GERD (gastroesophageal reflux disease)   . Glaucoma   . High cholesterol   . Hypertension   . Hypokalemia 02/2017  . Hypothyroidism   . LBBB (left bundle branch block)    chronic  . Myocardial infarction ([place]) 03/1991 X 2; 07/1991  . Obesity   . Seizures ([place])    "I'm on Dilantin" (06/10/2014)  . Sickle cell trait ([place])   . Type II diabetes mellitus (HCC)     Family History  Problem Relation Age of Onset  . Diabetes Mother     Past Surgical History:  Procedure Laterality Date  . CARDIAC CATHETERIZATION     "several;  they went up to check"  . CARDIAC CATHETERIZATION N/A 09/28/2016   Procedure: Left Heart Cath and Coronary Angiography;  Surgeon: Peter M Martinique, MD;  Location: [place] CV LAB;  Service: Cardiovascular;  Laterality: N/A;  . CATARACT EXTRACTION W/ INTRAOCULAR LENS  IMPLANT, BILATERAL Bilateral   . CHOLECYSTECTOMY    . COLONOSCOPY N/A 06/12/2014   Procedure: COLONOSCOPY;  Surgeon: Cleotis Nipper, MD;  Location: [facility] ENDOSCOPY;  Service: Endoscopy;  Laterality: N/A;  . CORONARY ANGIOPLASTY WITH STENT PLACEMENT  ? date; 05/04/2013   ? location; DES  to [place]  . CORONARY ARTERY BYPASS GRAFT  07/1991   "CABG X 3"  . EP IMPLANTABLE DEVICE N/A 09/29/2016   Procedure: Pacemaker Implant;  Surgeon: Will Meredith Leeds, MD;  Location: [place] CV LAB;  Service: Cardiovascular;  Laterality: N/A;  . ESOPHAGOGASTRODUODENOSCOPY N/A 06/12/2014   Procedure: ESOPHAGOGASTRODUODENOSCOPY (EGD);  Surgeon: Cleotis Nipper, MD;  Location: [facility] ENDOSCOPY;  Service: Endoscopy;  Laterality: N/A;  . LEFT AND RIGHT HEART CATHETERIZATION WITH CORONARY ANGIOGRAM N/A 05/04/2013   Procedure: LEFT AND RIGHT HEART CATHETERIZATION WITH CORONARY ANGIOGRAM;  Surgeon: Larey Dresser, MD;  Location: [facility] CATH LAB;  Service: Cardiovascular;  Laterality: N/A;  . LEFT HEART CATH AND CORS/GRAFTS ANGIOGRAPHY N/A 03/22/2018   Procedure: LEFT HEART CATH AND CORS/GRAFTS ANGIOGRAPHY;  Surgeon: Sherren Mocha, MD;  Location: [place] CV LAB;  Service: Cardiovascular;  Laterality: N/A;  . PERCUTANEOUS STENT INTERVENTION  05/04/2013   Procedure: PERCUTANEOUS STENT INTERVENTION;  Surgeon: Larey Dresser, MD;  Location: [facility] CATH LAB;  Service: Cardiovascular;;  . TUBAL LIGATION Bilateral 1969   Social History   Occupational History  . Not on file  Tobacco Use  . Smoking status: Never Smoker  . Smokeless tobacco: Never Used  Substance and Sexual Activity  . Alcohol use: No  . Drug use: No  . Sexual activity: Never

## 2018-10-16 NOTE — Addendum Note (Signed)
Addended by: Meyer Cory on: 10/16/2018 01:47 PM   Modules accepted: Orders

## 2018-10-17 LAB — CUP PACEART REMOTE DEVICE CHECK
Battery Remaining Longevity: 127 mo
Battery Remaining Percentage: 95.5 %
Battery Voltage: 3.01 V
Brady Statistic AS VP Percent: 61 %
Brady Statistic RA Percent Paced: 37 %
Brady Statistic RV Percent Paced: 99 %
Date Time Interrogation Session: 20200120100607
Implantable Lead Location: 753860
Implantable Pulse Generator Implant Date: 20180103
Lead Channel Impedance Value: 430 Ohm
Lead Channel Pacing Threshold Amplitude: 0.5 V
Lead Channel Pacing Threshold Pulse Width: 0.5 ms
Lead Channel Sensing Intrinsic Amplitude: 4.5 mV
Lead Channel Setting Pacing Amplitude: 0.625
Lead Channel Setting Pacing Amplitude: 1.5 V
MDC IDC LEAD IMPLANT DT: 20180103
MDC IDC LEAD IMPLANT DT: 20180103
MDC IDC LEAD LOCATION: 753859
MDC IDC MSMT LEADCHNL RV IMPEDANCE VALUE: 510 Ohm
MDC IDC MSMT LEADCHNL RV PACING THRESHOLD AMPLITUDE: 0.375 V
MDC IDC MSMT LEADCHNL RV PACING THRESHOLD PULSEWIDTH: 0.5 ms
MDC IDC MSMT LEADCHNL RV SENSING INTR AMPL: 12 mV
MDC IDC SET LEADCHNL RV PACING PULSEWIDTH: 0.5 ms
MDC IDC SET LEADCHNL RV SENSING SENSITIVITY: 4 mV
MDC IDC STAT BRADY AP VP PERCENT: 38 %
MDC IDC STAT BRADY AP VS PERCENT: 1 %
MDC IDC STAT BRADY AS VS PERCENT: 1 %
Pulse Gen Model: 2272
Pulse Gen Serial Number: 7988263

## 2018-10-17 NOTE — Progress Notes (Signed)
Remote pacemaker transmission.   

## 2018-11-01 ENCOUNTER — Telehealth: Payer: Self-pay | Admitting: Cardiology

## 2018-11-01 ENCOUNTER — Ambulatory Visit (INDEPENDENT_AMBULATORY_CARE_PROVIDER_SITE_OTHER): Payer: Medicare Other | Admitting: Orthopedic Surgery

## 2018-11-01 NOTE — Telephone Encounter (Signed)
Did not need this encounter °

## 2018-11-07 ENCOUNTER — Telehealth: Payer: Self-pay | Admitting: Cardiology

## 2018-11-07 NOTE — Telephone Encounter (Signed)
Anticoagulation managed by [facility] coumadin clinic

## 2018-11-07 NOTE — Telephone Encounter (Signed)
New Message      [facility] Pre-operative Risk Assessment    Request for surgical clearance:  1. What type of surgery is being performed? CT arthogram  2. When is this surgery scheduled? TBD   3. What type of clearance is required (medical clearance vs. Pharmacy clearance to hold med vs. Both)? Pharmacy   4. Are there any medications that need to be held prior to surgery and how long? Hold warfarin for 4 days prior   5. Practice name and name of physician performing surgery? [facility] Dr. Marlou Sa  6. What is your office phone number [phone]    7.   What is your office fax number [phone]  8.   Anesthesia type (None, local, MAC, general) ? none   Christine Gay 11/07/2018, 3:36 PM  _________________________________________________________________   (provider comments below)

## 2018-11-08 ENCOUNTER — Other Ambulatory Visit (HOSPITAL_COMMUNITY): Payer: Self-pay | Admitting: Cardiology

## 2018-11-08 NOTE — Telephone Encounter (Signed)
OK for Lovenox bridge

## 2018-11-08 NOTE — Telephone Encounter (Signed)
Patient with diagnosis of Afib on warfarin for anticoagulation.    Procedure: CT arthogram Date of procedure: TBD  CHADS2-VASc score of  7 (CHF, HTN, AGE, DM2, stroke/tia x 2, CAD, AGE, female)   Per office protocol, patient would meet criteria for lovenox bridge given CHADSVASC of 7, but I do not see that she has had lovenox in the past and pt does have history of GI bleed. Will route to Dr. Aundra Dubin for input on need to use lovenox while warfarin on hold for procedure.

## 2018-11-08 NOTE — Telephone Encounter (Signed)
Note added to upcoming appt with Coumadin clinic.

## 2018-11-09 NOTE — Telephone Encounter (Signed)
Faxed to requesting office. 

## 2018-11-09 NOTE — Telephone Encounter (Signed)
See note below, coumadin clinic will manage lovenox bridge on the next visit

## 2018-11-21 ENCOUNTER — Ambulatory Visit (INDEPENDENT_AMBULATORY_CARE_PROVIDER_SITE_OTHER): Payer: Medicare Other | Admitting: *Deleted

## 2018-11-21 ENCOUNTER — Telehealth: Payer: Self-pay | Admitting: *Deleted

## 2018-11-21 DIAGNOSIS — Z5181 Encounter for therapeutic drug level monitoring: Secondary | ICD-10-CM

## 2018-11-21 DIAGNOSIS — I4891 Unspecified atrial fibrillation: Secondary | ICD-10-CM

## 2018-11-21 LAB — POCT INR: INR: 2.3 (ref 2.0–3.0)

## 2018-11-21 NOTE — Patient Instructions (Addendum)
Description   Continue on same dosage 1.5 tablets every day except 1 tablet on Tuesdays, Thursdays and Saturdays. Recheck in 6 weeks. Coumadin Clinic [phone] Main [phone]    Call us when you get a date for your procedure. Coumadin Clinic [phone]

## 2018-11-21 NOTE — Telephone Encounter (Signed)
Received incoming call to triage from Leeds at [facility].  She is looking for clearance for patient to have arthrogram and be off coumadin.  Does not need cardiac clearance, just pharmacy.  Pt had appt today at coumadin clinic.  I spoke with Jinny Blossom, PharmD and then Myles Gip that pt needs to be scheduled for arthrogram and then call us with the date.  At that time she will be scheduled to come in to coumadin clinic to arrange lovenox bridging.   Explained this to [place] who verbalizes understanding that patient needs her procedure scheduled first.

## 2018-11-27 ENCOUNTER — Other Ambulatory Visit: Payer: Self-pay | Admitting: Cardiology

## 2018-11-29 ENCOUNTER — Ambulatory Visit (INDEPENDENT_AMBULATORY_CARE_PROVIDER_SITE_OTHER): Payer: Medicare Other

## 2018-11-29 ENCOUNTER — Ambulatory Visit (INDEPENDENT_AMBULATORY_CARE_PROVIDER_SITE_OTHER): Payer: Medicare Other | Admitting: Orthopedic Surgery

## 2018-11-29 ENCOUNTER — Other Ambulatory Visit (HOSPITAL_COMMUNITY): Payer: Self-pay | Admitting: *Deleted

## 2018-11-29 ENCOUNTER — Encounter (INDEPENDENT_AMBULATORY_CARE_PROVIDER_SITE_OTHER): Payer: Self-pay | Admitting: Orthopedic Surgery

## 2018-11-29 DIAGNOSIS — M79601 Pain in right arm: Secondary | ICD-10-CM

## 2018-11-29 MED ORDER — ISOSORBIDE MONONITRATE ER 60 MG PO TB24: 60.0000 mg | ORAL_TABLET | Freq: Two times a day (BID) | ORAL | 1 refills | Status: DC

## 2018-11-30 ENCOUNTER — Encounter (INDEPENDENT_AMBULATORY_CARE_PROVIDER_SITE_OTHER): Payer: Self-pay | Admitting: Orthopedic Surgery

## 2018-11-30 NOTE — Progress Notes (Signed)
Office Visit Note   Patient: Christine Gay           Date of Birth: 02/10/1941           MRN: [id number] Visit Date: 11/29/2018 Requested by: Seward Carol, MD [address] [place], [place] [address] PCP: Seward Carol, MD  Subjective: Chief Complaint  Patient presents with  . Arm Pain    HPI: Patient presents for evaluation of right arm and shoulder and hand pain.  When I saw her last clinic visit I wanted to get a CT arthrogram of that right shoulder to look for some treatable bursitis cuff or labral pathology.  In general the shoulder has improved.  Now she reports several week history of atraumatic onset right hand pain.  She reports decreased grip strength but no numbness and tingling.  States she is having some swelling in that right wrist as well.              ROS: All systems reviewed are negative as they relate to the chief complaint within the history of present illness.  Patient denies  fevers or chills.   Assessment & Plan: Visit Diagnoses:  1. Pain of right upper extremity     Plan: Impression is marginal improvement in the right shoulder with no voluntary pursuit of further diagnostic work-up on the patient's part.  We did have a CT scan set up for her but she chose not to do it.  In regards to the right wrist this is slightly more problematic.  There is definitely flattening of the lunate as well as possible scapholunate ligament widening.  Could be kind box disease or some other intercarpal malalignment and ligament problem.  Needs CT scan for better delineation of the bony anatomy.  No obvious fracture is present but significant degenerative changes noted in the wrist.  Interestingly she does not have that much loss of range of motion.  Follow-up after that CT scan.  Follow-Up Instructions: Return for after MRI.   Orders:  Orders Placed This Encounter  Procedures  . XR Wrist Complete Right  . CT WRIST RIGHT WO CONTRAST   No orders of the defined  types were placed in this encounter.     Procedures: No procedures performed   Clinical Data: No additional findings.  Objective: Vital Signs: There were no vitals taken for this visit.  Physical Exam:   Constitutional: Patient appears well-developed HEENT:  Head: Normocephalic Eyes:EOM are normal Neck: Normal range of motion Cardiovascular: Normal rate Pulmonary/chest: Effort normal Neurologic: Patient is alert Skin: Skin is warm Psychiatric: Patient has normal mood and affect    Ortho Exam: Ortho exam demonstrates pretty reasonable right shoulder range of motion with improvement in forward flexion abduction.  No real weakness to rotator cuff testing but impingement signs remain positive on the right.  No discrete AC joint tenderness is noted.  On the right wrist she does have some swelling but has dorsiflexion and palmar flexion of about 60 degrees to 70 degrees total arc.  Grip strength is slightly less on the right compared to left radial pulses intact bilaterally.  Finger flexion and extension as well as thumb flexion and extension is full and intact.  Does have some CMC tenderness to direct palpation as well as tenderness to direct palpation at the radiocarpal joint.  Specialty Comments:  No specialty comments available.  Imaging: No results found.   PMFS History: Patient Active Problem List   Diagnosis Date Noted  .  Chronic anticoagulation 03/23/2018  . Pacemaker 03/23/2018  . Abnormal nuclear cardiac imaging test   . NSTEMI (non-ST elevated myocardial infarction) ([place]) 03/18/2018  . Shoulder pain, left 03/09/2017  . Seizure ([place])   . Chest pain 03/08/2017  . Muscle spasm 03/08/2017  . Hypocalcemia 03/08/2017  . Encounter for therapeutic drug monitoring 02/08/2017  . Atrial fibrillation ([place]) 02/08/2017  . Complete heart block ([place]) 09/28/2016  . Third degree heart block ([place])   . Unstable angina ([place])   . CAD (coronary artery disease) 11/11/2014  .  Dizziness 07/29/2014  . GI bleed 06/12/2014  . GERD (gastroesophageal reflux disease) 06/11/2014  . Type II or unspecified type diabetes mellitus without mention of complication, not stated as uncontrolled 06/11/2014  . Glaucoma 06/11/2014  . Microcytic hypochromic anemia 06/10/2014  . Chronic diastolic CHF (congestive heart failure) ([place]) 08/09/2013  . Hypotension 05/18/2013  . Exertional dyspnea 03/15/2013  . CHF (congestive heart failure) ([place]) 03/15/2013  . MURMUR 03/02/2010  . Hypothyroidism 03/20/2009  . HYPERCHOLESTEROLEMIA  IIA 03/20/2009  . HYPOKALEMIA 03/20/2009  . HYPERTENSION, BENIGN 03/20/2009  . CAD, ARTERY BYPASS GRAFT 03/20/2009   Past Medical History:  Diagnosis Date  . Anemia    a. Noted on labs 04/2013.  . Anginal pain ([place])   . Arthritis    "legs" (06/10/2014)  . CAD (coronary artery disease)    a. s/p CABG 1992, b. s/p PCI several years ago; c. LHC 11/08: L-LAD ok, S-OM1 ok, S-OM2 CTO, prox to mid CFX 70% (unchanged), RCA non-dominant, occluded;  d.  Echo 2/11: EF 55%, mod LVH, Ao sclerosis;  e. Echo 6/14: mild LVH, mild focal basal sept hypertrophy, EF 55-65%, inf HK, Mild AI, mild MR, mild BAE;  f. Myoview 7/14: int risk-> cath 04/2013 s/p PTCA/DES to prox Cx 05/04/13.  . Chronic diastolic CHF (congestive heart failure) ([place])   . GERD (gastroesophageal reflux disease)   . Glaucoma   . High cholesterol   . Hypertension   . Hypokalemia 02/2017  . Hypothyroidism   . LBBB (left bundle branch block)    chronic  . Myocardial infarction ([place]) 03/1991 X 2; 07/1991  . Obesity   . Seizures ([place])    "I'm on Dilantin" (06/10/2014)  . Sickle cell trait ([place])   . Type II diabetes mellitus (H[facility])     Family History  Problem Relation Age of Onset  . Diabetes Mother     Past Surgical History:  Procedure Laterality Date  . CARDIAC CATHETERIZATION     "several;  they went up to check"  . CARDIAC CATHETERIZATION N/A 09/28/2016   Procedure: Left Heart Cath and Coronary  Angiography;  Surgeon: Peter M Martinique, MD;  Location: [facility];  Service: Cardiovascular;  Laterality: N/A;  . CATARACT EXTRACTION W/ INTRAOCULAR LENS  IMPLANT, BILATERAL Bilateral   . CHOLECYSTECTOMY    . COLONOSCOPY N/A 06/12/2014   Procedure: COLONOSCOPY;  Surgeon: Cleotis Nipper, MD;  Location: [facility] ENDOSCOPY;  Service: Endoscopy;  Laterality: N/A;  . CORONARY ANGIOPLASTY WITH STENT PLACEMENT  ? date; 05/04/2013   ? location; DES  to [place]  . CORONARY ARTERY BYPASS GRAFT  07/1991   "CABG X 3"  . EP IMPLANTABLE DEVICE N/A 09/29/2016   Procedure: Pacemaker Implant;  Surgeon: Will Meredith Leeds, MD;  Location: [facility];  Service: Cardiovascular;  Laterality: N/A;  . ESOPHAGOGASTRODUODENOSCOPY N/A 06/12/2014   Procedure: ESOPHAGOGASTRODUODENOSCOPY (EGD);  Surgeon: Cleotis Nipper, MD;  Location: [facility] ENDOSCOPY;  Service: Endoscopy;  Laterality: N/A;  . LEFT AND RIGHT HEART CATHETERIZATION WITH CORONARY ANGIOGRAM N/A 05/04/2013   Procedure: LEFT AND RIGHT HEART CATHETERIZATION WITH CORONARY ANGIOGRAM;  Surgeon: Larey Dresser, MD;  Location: [facility] CATH LAB;  Service: Cardiovascular;  Laterality: N/A;  . LEFT HEART CATH AND CORS/GRAFTS ANGIOGRAPHY N/A 03/22/2018   Procedure: LEFT HEART CATH AND CORS/GRAFTS ANGIOGRAPHY;  Surgeon: Sherren Mocha, MD;  Location: [facility];  Service: Cardiovascular;  Laterality: N/A;  . PERCUTANEOUS STENT INTERVENTION  05/04/2013   Procedure: PERCUTANEOUS STENT INTERVENTION;  Surgeon: Larey Dresser, MD;  Location: [facility] CATH LAB;  Service: Cardiovascular;;  . TUBAL LIGATION Bilateral 1969   Social History   Occupational History  . Not on file  Tobacco Use  . Smoking status: Never Smoker  . Smokeless tobacco: Never Used  Substance and Sexual Activity  . Alcohol use: No  . Drug use: No  . Sexual activity: Never

## 2018-12-01 NOTE — Telephone Encounter (Signed)
[facility] is calling back with a procedure date. The pt is scheduled for 03/17 and is having a CT of her wrist and she is having an arthrogram and the injection of the CT is at 2pm. [facility] wants to know when the Pt should arrive for her INR, and the pt  also would need instructions for the levanox.

## 2018-12-01 NOTE — Progress Notes (Signed)
Patient for CT arthrogram of right shoulder at 1445 on 12/12/2018.  We need patient to have INR checked that morning, please.  I see she is being bridged with Lovenox, so we need her to hold one dose or be off it for at least 12 hours, depending on how cardiology/pharmacy is ordering it.  Feel free to call me at [facility] at [phone] with any questions/issues.  Brita Romp, RN

## 2018-12-07 ENCOUNTER — Ambulatory Visit (INDEPENDENT_AMBULATORY_CARE_PROVIDER_SITE_OTHER): Payer: Medicare Other | Admitting: *Deleted

## 2018-12-07 ENCOUNTER — Other Ambulatory Visit: Payer: Self-pay

## 2018-12-07 DIAGNOSIS — I4891 Unspecified atrial fibrillation: Secondary | ICD-10-CM

## 2018-12-07 DIAGNOSIS — I480 Paroxysmal atrial fibrillation: Secondary | ICD-10-CM | POA: Diagnosis not present

## 2018-12-07 DIAGNOSIS — Z5181 Encounter for therapeutic drug level monitoring: Secondary | ICD-10-CM | POA: Diagnosis not present

## 2018-12-07 LAB — POCT INR: INR: 1.5 — AB (ref 2.0–3.0)

## 2018-12-07 MED ORDER — ENOXAPARIN SODIUM 120 MG/0.8ML ~~LOC~~ SOLN: 120.0000 mg | SUBCUTANEOUS | 1 refills | Status: DC

## 2018-12-07 NOTE — Patient Instructions (Addendum)
Description   Refer to pt instructions. Recheck in 1 week after procedure (usually 6 weeks). Normal dose: Continue on same dosage 1.5 tablets every day except 1 tablet on Tuesdays, Thursdays and Saturdays. Coumadin Clinic [phone] Main [phone]    12/06/2018: Last dose of Coumadin.  12/07/2018: Inject Lovenox 120mg  in the fatty abdominal tissue at least 2 inches from the belly button daily at 8pm, rotate sites. No Coumadin.  12/08/2018: Inject Lovenox in the fatty tissue at 8pm. No Coumadin.  12/09/2018: Inject Lovenox in the fatty tissue at 8pm. No Coumadin.  12/10/2018: Inject Lovenox in the fatty tissue at 8pm. No Coumadin.  12/11/2018: No Coumadin and No Lovenox.   12/12/2018: Procedure Day - No Lovenox - Resume Coumadin in the evening or as directed by doctor.  12/13/2018: Resume Lovenox inject in the fatty tissue at 8am and take Coumadin.  12/14/2018: Inject Lovenox in the fatty tissue at 8am and take Coumadin.  12/15/2018: Inject Lovenox in the fatty tissue at 8am and take Coumadin.  3/21/22020: Inject Lovenox in the fatty tissue at 8am and take Coumadin.  12/17/2018: Inject Lovenox in the fatty tissue at 8am and take Coumadin.  12/18/2018: Inject Lovenox in the fatty tissue at 8am and take Coumadin.  12/19/2018:  Inject Lovenox in the fatty tissue at 8am and report to Coumadin appt to check INR.

## 2018-12-12 ENCOUNTER — Ambulatory Visit
Admission: RE | Admit: 2018-12-12 | Discharge: 2018-12-12 | Disposition: A | Discharge status: Home / Self Care | Payer: Medicare Other | Source: Ambulatory Visit | Attending: Orthopedic Surgery | Admitting: Orthopedic Surgery

## 2018-12-12 ENCOUNTER — Other Ambulatory Visit: Payer: Self-pay

## 2018-12-12 DIAGNOSIS — M79601 Pain in right arm: Secondary | ICD-10-CM

## 2018-12-12 DIAGNOSIS — M7551 Bursitis of right shoulder: Secondary | ICD-10-CM

## 2018-12-12 MED ORDER — IOPAMIDOL (ISOVUE-M 200) INJECTION 41%
15.0000 mL | Freq: Once | INTRAMUSCULAR | Status: AC
  Administered 2018-12-12: 15 mL via INTRA_ARTICULAR

## 2018-12-14 ENCOUNTER — Encounter (INDEPENDENT_AMBULATORY_CARE_PROVIDER_SITE_OTHER): Payer: Self-pay | Admitting: Orthopedic Surgery

## 2018-12-14 ENCOUNTER — Other Ambulatory Visit: Payer: Self-pay

## 2018-12-14 ENCOUNTER — Encounter (HOSPITAL_COMMUNITY): Payer: Medicare Other | Admitting: Cardiology

## 2018-12-14 ENCOUNTER — Ambulatory Visit (INDEPENDENT_AMBULATORY_CARE_PROVIDER_SITE_OTHER): Payer: Medicare Other | Admitting: Orthopedic Surgery

## 2018-12-14 DIAGNOSIS — M75121 Complete rotator cuff tear or rupture of right shoulder, not specified as traumatic: Secondary | ICD-10-CM

## 2018-12-14 DIAGNOSIS — M92211 Osteochondrosis (juvenile) of carpal lunate [Kienbock], right hand: Secondary | ICD-10-CM | POA: Diagnosis not present

## 2018-12-14 NOTE — Progress Notes (Signed)
Office Visit Note   Patient: Christine Gay           Date of Birth: [birth year]           MRN: [id number] Visit Date: 12/14/2018 Requested by: Seward Carol, MD [address] [place], [place] [address] PCP: Seward Carol, MD  Subjective: Chief Complaint  Patient presents with  . Right Shoulder - Follow-up  . Right Wrist - Follow-up    HPI: Patient presents for follow-up of right shoulder right wrist as well as new left posterior elbow mass.  In the right shoulder the CT scan is reviewed.  She has AC joint osteoarthritis as well as 1 cm tear of the supraspinatus.  In general her shoulder is doing reasonably well.  This is something she wants to live with.  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  Right wrist CT scan shows changes consistent with keinbocks disease.  CMC arthritis is also present.  She currently is back on Coumadin and Lovenox..  She had a Lovenox bridge as result of getting the CT arthrogram of the right shoulder.  Patient also describes some mildly painful left arm mass just about a handbreadth superior to the olecranon.  This is on the left-hand side.  Denies any history of trauma.  Denies any fevers or chills.              ROS: All systems reviewed are negative as they relate to the chief complaint within the history of present illness.  Patient denies  fevers or chills.   Assessment & Plan: Visit Diagnoses:  1. Nontraumatic complete tear of right rotator cuff   2. Osteochondrosis of lunate of right wrist     Plan: Impression is right shoulder rotator cuff tear and AC joint arthritis.  Not particularly tender over the [facility] joint today.  Shoulder range of motion is functional.  Injection subacromial space could be performed if she gets more symptomatic.  In regards to the right wrist I think that options for treatment discussed include observation splinting injection or surgery.  She wants to consider an  injection in the wrist but I would like her to get off both Coumadin and Lovenox before we do that.  I think she is okay to be on Coumadin and we can inject that wrist but I do not want to inject the wrist when she is on both those anticoagulants.  In regards to that left arm cyst I will see her back in 4 weeks and we can reevaluate it then.  Probably would use ultrasound.  She does not really want to go through big work-up like she had for the shoulder and wrist at this time.  I think ultrasound to evaluate the mass would be indicated and we will see if it something that resolves on its own.  Do not feel particularly hard but I think we do need reevaluated in 4 weeks.  Follow-Up Instructions: Return in about 4 weeks (around 01/11/2019).   Orders:  No orders of the defined types were placed in this encounter.  No orders of the defined types were placed in this encounter.     Procedures: No procedures performed   Clinical Data: No additional findings.  Objective: Vital Signs: There were no vitals taken for this visit.  Physical Exam:   Constitutional: Patient appears well-developed HEENT:  Head: Normocephalic Eyes:EOM are normal Neck: Normal range of motion Cardiovascular:  Normal rate Pulmonary/chest: Effort normal Neurologic: Patient is alert Skin: Skin is warm Psychiatric: Patient has normal mood and affect    Ortho Exam: Ortho exam demonstrates full active and passive range of motion of her cervical spine.  Wrist has diminished range of motion which is unchanged from prior clinic visit.  Grip strength also slightly diminished right versus left but it is functional.  Radial pulse palpable bilaterally.  Mild swelling noted in the radiocarpal joint right versus left.  Shoulder has pretty reasonable active and passive range of motion with no restriction of external rotation of 15 degrees of abduction.  No discrete AC joint tenderness to direct palpation.  Left arm patient has a 2.5  x 2.5 cm mass which appears to be right at the border of the subdermal tissue and the fascial region of the triceps.  No proximal lymphadenopathy is present.  Mass has mild tenderness but there is no erythema or warmth to it.  Seems to be mobile beneath the skin.  I think this is something that we need to reevaluate in 4 weeks.  She will need x-rays at that time as well on that left elbow.  Specialty Comments:  No specialty comments available.  Imaging: No results found.   PMFS History: Patient Active Problem List   Diagnosis Date Noted  . Chronic anticoagulation 03/23/2018  . Pacemaker 03/23/2018  . Abnormal nuclear cardiac imaging test   . NSTEMI (non-ST elevated myocardial infarction) ([place]) 03/18/2018  . Shoulder pain, left 03/09/2017  . Seizure ([place])   . Chest pain 03/08/2017  . Muscle spasm 03/08/2017  . Hypocalcemia 03/08/2017  . Encounter for therapeutic drug monitoring 02/08/2017  . Atrial fibrillation ([place]) 02/08/2017  . Complete heart block ([place]) 09/28/2016  . Third degree heart block ([place])   . Unstable angina ([place])   . CAD (coronary artery disease) 11/11/2014  . Dizziness 07/29/2014  . GI bleed 06/12/2014  . GERD (gastroesophageal reflux disease) 06/11/2014  . Type II or unspecified type diabetes mellitus without mention of complication, not stated as uncontrolled 06/11/2014  . Glaucoma 06/11/2014  . Microcytic hypochromic anemia 06/10/2014  . Chronic diastolic CHF (congestive heart failure) ([place]) 08/09/2013  . Hypotension 05/18/2013  . Exertional dyspnea 03/15/2013  . CHF (congestive heart failure) ([place]) 03/15/2013  . MURMUR 03/02/2010  . Hypothyroidism 03/20/2009  . HYPERCHOLESTEROLEMIA  IIA 03/20/2009  . HYPOKALEMIA 03/20/2009  . HYPERTENSION, BENIGN 03/20/2009  . CAD, ARTERY BYPASS GRAFT 03/20/2009   Past Medical History:  Diagnosis Date  . Anemia    a. Noted on labs 04/2013.  . Anginal pain ([place])   . Arthritis    "legs" (06/10/2014)  . CAD (coronary  artery disease)    a. s/p CABG 1992, b. s/p PCI several years ago; c. LHC 11/08: L-LAD ok, S-OM1 ok, S-OM2 CTO, prox to mid CFX 70% (unchanged), RCA non-dominant, occluded;  d.  Echo 2/11: EF 55%, mod LVH, Ao sclerosis;  e. Echo 6/14: mild LVH, mild focal basal sept hypertrophy, EF 55-65%, inf HK, Mild AI, mild MR, mild BAE;  f. Myoview 7/14: int risk-> cath 04/2013 s/p PTCA/DES to prox Cx 05/04/13.  . Chronic diastolic CHF (congestive heart failure) ([place])   . GERD (gastroesophageal reflux disease)   . Glaucoma   . High cholesterol   . Hypertension   . Hypokalemia 02/2017  . Hypothyroidism   . LBBB (left bundle branch block)    chronic  . Myocardial infarction ([place]) 03/1991 X 2; 07/1991  . Obesity   .  Seizures ([place])    "I'm on Dilantin" (06/10/2014)  . Sickle cell trait ([place])   . Type II diabetes mellitus ([facility])     Family History  Problem Relation Age of Onset  . Diabetes Mother     Past Surgical History:  Procedure Laterality Date  . CARDIAC CATHETERIZATION     "several;  they went up to check"  . CARDIAC CATHETERIZATION N/A 09/28/2016   Procedure: Left Heart Cath and Coronary Angiography;  Surgeon: Peter M Martinique, MD;  Location: [facility];  Service: Cardiovascular;  Laterality: N/A;  . CATARACT EXTRACTION W/ INTRAOCULAR LENS  IMPLANT, BILATERAL Bilateral   . CHOLECYSTECTOMY    . COLONOSCOPY N/A 06/12/2014   Procedure: COLONOSCOPY;  Surgeon: Cleotis Nipper, MD;  Location: [facility] ENDOSCOPY;  Service: Endoscopy;  Laterality: N/A;  . CORONARY ANGIOPLASTY WITH STENT PLACEMENT  ? date; 05/04/2013   ? location; DES  to [place]  . CORONARY ARTERY BYPASS GRAFT  07/1991   "CABG X 3"  . EP IMPLANTABLE DEVICE N/A 09/29/2016   Procedure: Pacemaker Implant;  Surgeon: Will Meredith Leeds, MD;  Location: [facility];  Service: Cardiovascular;  Laterality: N/A;  . ESOPHAGOGASTRODUODENOSCOPY N/A 06/12/2014   Procedure: ESOPHAGOGASTRODUODENOSCOPY (EGD);  Surgeon: Cleotis Nipper, MD;   Location: [facility] ENDOSCOPY;  Service: Endoscopy;  Laterality: N/A;  . LEFT AND RIGHT HEART CATHETERIZATION WITH CORONARY ANGIOGRAM N/A 05/04/2013   Procedure: LEFT AND RIGHT HEART CATHETERIZATION WITH CORONARY ANGIOGRAM;  Surgeon: Larey Dresser, MD;  Location: [facility] CATH LAB;  Service: Cardiovascular;  Laterality: N/A;  . LEFT HEART CATH AND CORS/GRAFTS ANGIOGRAPHY N/A 03/22/2018   Procedure: LEFT HEART CATH AND CORS/GRAFTS ANGIOGRAPHY;  Surgeon: Sherren Mocha, MD;  Location: [facility];  Service: Cardiovascular;  Laterality: N/A;  . PERCUTANEOUS STENT INTERVENTION  05/04/2013   Procedure: PERCUTANEOUS STENT INTERVENTION;  Surgeon: Larey Dresser, MD;  Location: [facility] CATH LAB;  Service: Cardiovascular;;  . TUBAL LIGATION Bilateral 1969   Social History   Occupational History  . Not on file  Tobacco Use  . Smoking status: Never Smoker  . Smokeless tobacco: Never Used  Substance and Sexual Activity  . Alcohol use: No  . Drug use: No  . Sexual activity: Never

## 2018-12-18 ENCOUNTER — Telehealth: Payer: Self-pay

## 2018-12-18 NOTE — Telephone Encounter (Signed)

## 2018-12-19 ENCOUNTER — Ambulatory Visit (INDEPENDENT_AMBULATORY_CARE_PROVIDER_SITE_OTHER): Payer: Medicare Other | Admitting: *Deleted

## 2018-12-19 ENCOUNTER — Other Ambulatory Visit: Payer: Self-pay

## 2018-12-19 ENCOUNTER — Telehealth: Payer: Self-pay | Admitting: Cardiology

## 2018-12-19 DIAGNOSIS — I4891 Unspecified atrial fibrillation: Secondary | ICD-10-CM | POA: Diagnosis not present

## 2018-12-19 DIAGNOSIS — Z5181 Encounter for therapeutic drug level monitoring: Secondary | ICD-10-CM

## 2018-12-19 LAB — POCT INR: INR: 1.8 — AB (ref 2.0–3.0)

## 2018-12-19 NOTE — Telephone Encounter (Signed)
Returned call to pt, see anticoagulation encounter.

## 2018-12-19 NOTE — Telephone Encounter (Signed)
Patient returning call.

## 2018-12-27 ENCOUNTER — Other Ambulatory Visit: Payer: Self-pay

## 2018-12-27 ENCOUNTER — Encounter: Payer: Self-pay | Admitting: Podiatry

## 2018-12-27 ENCOUNTER — Ambulatory Visit (INDEPENDENT_AMBULATORY_CARE_PROVIDER_SITE_OTHER): Payer: Medicare Other | Admitting: Podiatry

## 2018-12-27 VITALS — Temp 98.4°F

## 2018-12-27 DIAGNOSIS — B351 Tinea unguium: Secondary | ICD-10-CM

## 2018-12-27 DIAGNOSIS — D689 Coagulation defect, unspecified: Secondary | ICD-10-CM

## 2018-12-27 DIAGNOSIS — E1140 Type 2 diabetes mellitus with diabetic neuropathy, unspecified: Secondary | ICD-10-CM

## 2018-12-27 DIAGNOSIS — M79676 Pain in unspecified toe(s): Secondary | ICD-10-CM | POA: Diagnosis not present

## 2018-12-27 NOTE — Progress Notes (Signed)
Patient ID: Christine Gay, female   DOB: Feb 13, 1941, 78 y.o.   MRN: [id number] Complaint:  Visit Type: Patient returns to my office for continued preventative foot care services. Complaint: Patient states" my nails have grown long and thick and become painful to walk and wear shoes" Patient has been diagnosed with DM with no foot complications. The patient presents for preventative foot care services. No changes to ROS  Podiatric Exam: Vascular: dorsalis pedis and posterior tibial pulses are palpable bilateral. Capillary return is immediate. Temperature gradient is WNL. Skin turgor WNL  Sensorium: Diminished  Semmes Weinstein monofilament test. Normal tactile sensation bilaterally. Nail Exam: Pt has thick disfigured discolored nails with subungual debris noted bilateral entire nail hallux through fifth toenails Ulcer Exam: There is no evidence of ulcer or pre-ulcerative changes or infection. Orthopedic Exam: Muscle tone and strength are WNL. No limitations in general ROM. No crepitus or effusions noted. Foot type and digits show no abnormalities. Bony prominences are unremarkable. HAV 1st  MPJ B/L.  Hammer toes 2 B/l Skin: No Porokeratosis. No infection or ulcers.  Asymptomatic Callus sub 5th B/L  Diagnosis:  Onychomycosis, , Pain in right toe, pain in left toes,   Treatment & Plan Procedures and Treatment: Consent by patient was obtained for treatment procedures. The patient understood the discussion of treatment and procedures well. All questions were answered thoroughly reviewed. Debridement of mycotic and hypertrophic toenails, 1 through 5 bilateral and clearing of subungual debris. No ulceration, no infection noted. Padding dispensed. Return Visit-Office Procedure: Patient instructed to return to the office for a follow up visit 3 months for continued evaluation and treatment.  Gardiner Barefoot DPM

## 2019-01-01 ENCOUNTER — Telehealth: Payer: Self-pay

## 2019-01-01 NOTE — Telephone Encounter (Signed)

## 2019-01-02 ENCOUNTER — Ambulatory Visit (INDEPENDENT_AMBULATORY_CARE_PROVIDER_SITE_OTHER): Payer: Medicare Other | Admitting: *Deleted

## 2019-01-02 ENCOUNTER — Other Ambulatory Visit: Payer: Self-pay

## 2019-01-02 DIAGNOSIS — I4891 Unspecified atrial fibrillation: Secondary | ICD-10-CM | POA: Diagnosis not present

## 2019-01-02 DIAGNOSIS — Z5181 Encounter for therapeutic drug level monitoring: Secondary | ICD-10-CM

## 2019-01-02 LAB — POCT INR: INR: 2.2 (ref 2.0–3.0)

## 2019-01-09 ENCOUNTER — Telehealth: Payer: Self-pay | Admitting: Cardiology

## 2019-01-09 NOTE — Telephone Encounter (Signed)
New Message            Patient is needing a call back concerning her labs

## 2019-01-09 NOTE — Telephone Encounter (Signed)
Addressed, see anticoag encounter 4/7 for details.

## 2019-01-09 NOTE — Telephone Encounter (Signed)
Pt calling in to speak to the Coumadin clinic.   States she was here last week, 4/7, and had her Coumadin checked in the "drive-thru". States she was told she would get a call with instructions for Coumadin dosing and when her next check should be.   She is concerned b/c she hasn't heard from the office and does not know how much Coumadin to be taking. Will forward to Coumadin clinic to address

## 2019-01-15 ENCOUNTER — Ambulatory Visit (INDEPENDENT_AMBULATORY_CARE_PROVIDER_SITE_OTHER): Payer: Medicare Other | Admitting: *Deleted

## 2019-01-15 ENCOUNTER — Other Ambulatory Visit: Payer: Self-pay

## 2019-01-15 DIAGNOSIS — I442 Atrioventricular block, complete: Secondary | ICD-10-CM

## 2019-01-16 LAB — CUP PACEART REMOTE DEVICE CHECK
Battery Remaining Longevity: 130 mo
Battery Remaining Percentage: 95.5 %
Battery Voltage: 3.01 V
Brady Statistic AP VP Percent: 42 %
Brady Statistic AP VS Percent: 1 %
Brady Statistic AS VP Percent: 57 %
Brady Statistic AS VS Percent: 1 %
Brady Statistic RA Percent Paced: 41 %
Brady Statistic RV Percent Paced: 99 %
Date Time Interrogation Session: 20200421055022
Implantable Lead Implant Date: 20180103
Implantable Lead Implant Date: 20180103
Implantable Lead Location: 753859
Implantable Lead Location: 753860
Implantable Pulse Generator Implant Date: 20180103
Lead Channel Impedance Value: 400 Ohm
Lead Channel Impedance Value: 510 Ohm
Lead Channel Pacing Threshold Amplitude: 0.375 V
Lead Channel Pacing Threshold Amplitude: 0.5 V
Lead Channel Pacing Threshold Pulse Width: 0.5 ms
Lead Channel Pacing Threshold Pulse Width: 0.5 ms
Lead Channel Sensing Intrinsic Amplitude: 12 mV
Lead Channel Sensing Intrinsic Amplitude: 2.6 mV
Lead Channel Setting Pacing Amplitude: 0.625
Lead Channel Setting Pacing Amplitude: 1.5 V
Lead Channel Setting Pacing Pulse Width: 0.5 ms
Lead Channel Setting Sensing Sensitivity: 4 mV
Pulse Gen Model: 2272
Pulse Gen Serial Number: 7988263

## 2019-01-17 ENCOUNTER — Other Ambulatory Visit: Payer: Self-pay

## 2019-01-17 ENCOUNTER — Ambulatory Visit (INDEPENDENT_AMBULATORY_CARE_PROVIDER_SITE_OTHER): Payer: Medicare Other | Admitting: Orthopedic Surgery

## 2019-01-17 ENCOUNTER — Other Ambulatory Visit: Payer: Self-pay | Admitting: Cardiology

## 2019-01-17 ENCOUNTER — Encounter (INDEPENDENT_AMBULATORY_CARE_PROVIDER_SITE_OTHER): Payer: Self-pay | Admitting: Orthopedic Surgery

## 2019-01-17 DIAGNOSIS — M75121 Complete rotator cuff tear or rupture of right shoulder, not specified as traumatic: Secondary | ICD-10-CM | POA: Diagnosis not present

## 2019-01-19 ENCOUNTER — Encounter (INDEPENDENT_AMBULATORY_CARE_PROVIDER_SITE_OTHER): Payer: Self-pay | Admitting: Orthopedic Surgery

## 2019-01-19 DIAGNOSIS — M75121 Complete rotator cuff tear or rupture of right shoulder, not specified as traumatic: Secondary | ICD-10-CM | POA: Diagnosis not present

## 2019-01-19 MED ORDER — METHYLPREDNISOLONE ACETATE 40 MG/ML IJ SUSP
40.0000 mg | INTRAMUSCULAR | Status: AC | PRN
  Administered 2019-01-19: 40 mg via INTRA_ARTICULAR

## 2019-01-19 MED ORDER — BUPIVACAINE HCL 0.5 % IJ SOLN
9.0000 mL | INTRAMUSCULAR | Status: AC | PRN
  Administered 2019-01-19: 9 mL via INTRA_ARTICULAR

## 2019-01-19 MED ORDER — LIDOCAINE HCL 1 % IJ SOLN
5.0000 mL | INTRAMUSCULAR | Status: AC | PRN
  Administered 2019-01-19: 5 mL

## 2019-01-19 NOTE — Progress Notes (Signed)
Office Visit Note   Patient: Christine Gay           Date of Birth: [birth year]           MRN: [id number] Visit Date: 01/17/2019 Requested by: Seward Carol, MD [address] [place], [place] [address] PCP: Seward Carol, MD  Subjective: Chief Complaint  Patient presents with  . Right Shoulder - Pain  . Left Shoulder - Pain  . Right Wrist - Pain    HPI: Christine Gay is a patient with right shoulder pain.  Since have seen her she has had CT arthrogram which shows 1 cm rotator cuff tear as well as AC joint arthritis.  She also has had a CT scan of the right wrist which does show early [place] box disease.  Her right wrist is doing well the shoulder is mildly symptomatic at this time.  She localizes the pain primarily in the deltoid region.              ROS: All systems reviewed are negative as they relate to the chief complaint within the history of present illness.  Patient denies  fevers or chills.   Assessment & Plan: Visit Diagnoses:  1. Nontraumatic complete tear of right rotator cuff     Plan: Impression is right shoulder pain with known rotator cuff tear.  She is currently fairly functional and does not really want to proceed with any intervention other than injection in the subacromial space for pain relief.  That is performed today.  In regards to that right wrist she is doing better and I do not think that based on her functional limitations which are minimal that we should do any intervention for the wrist.  I will see her back as needed.  Follow-Up Instructions: Return if symptoms worsen or fail to improve.   Orders:  No orders of the defined types were placed in this encounter.  No orders of the defined types were placed in this encounter.     Procedures: Large Joint Inj: R subacromial bursa on 01/19/2019 9:33 AM Indications: diagnostic evaluation and pain Details: 18 G 1.5 in needle, posterior approach  Arthrogram: No  Medications: 9 mL bupivacaine  0.5 %; 40 mg methylPREDNISolone acetate 40 MG/ML; 5 mL lidocaine 1 % Outcome: tolerated well, no immediate complications Procedure, treatment alternatives, risks and benefits explained, specific risks discussed. Consent was given by the patient. Immediately prior to procedure a time out was called to verify the correct patient, procedure, equipment, support staff and site/side marked as required. Patient was prepped and draped in the usual sterile fashion.       Clinical Data: No additional findings.  Objective: Vital Signs: There were no vitals taken for this visit.  Physical Exam:   Constitutional: Patient appears well-developed HEENT:  Head: Normocephalic Eyes:EOM are normal Neck: Normal range of motion Cardiovascular: Normal rate Pulmonary/chest: Effort normal Neurologic: Patient is alert Skin: Skin is warm Psychiatric: Patient has normal mood and affect    Ortho Exam: Ortho exam demonstrates full active and passive range of motion of the cervical spine.  Right shoulder she can get forward flexion abduction to 90.  Slightly weak to supraspinatus testing on the right.  Some coarse grinding and crepitus with active and passive range of motion.  On the right wrist she does have some pain to palpation both dorsally and palmarly.  No change in range of motion from prior exam.  Finger flexion extension is functional.  Radial pulses  intact.  Specialty Comments:  No specialty comments available.  Imaging: No results found.   PMFS History: Patient Active Problem List   Diagnosis Date Noted  . Chronic anticoagulation 03/23/2018  . Pacemaker 03/23/2018  . Abnormal nuclear cardiac imaging test   . NSTEMI (non-ST elevated myocardial infarction) ([place]) 03/18/2018  . Shoulder pain, left 03/09/2017  . Seizure ([place])   . Chest pain 03/08/2017  . Muscle spasm 03/08/2017  . Hypocalcemia 03/08/2017  . Encounter for therapeutic drug monitoring 02/08/2017  . Atrial fibrillation ([place])  02/08/2017  . Complete heart block ([place]) 09/28/2016  . Third degree heart block ([place])   . Unstable angina ([place])   . CAD (coronary artery disease) 11/11/2014  . Dizziness 07/29/2014  . GI bleed 06/12/2014  . GERD (gastroesophageal reflux disease) 06/11/2014  . Type II or unspecified type diabetes mellitus without mention of complication, not stated as uncontrolled 06/11/2014  . Glaucoma 06/11/2014  . Microcytic hypochromic anemia 06/10/2014  . Chronic diastolic CHF (congestive heart failure) ([place]) 08/09/2013  . Hypotension 05/18/2013  . Exertional dyspnea 03/15/2013  . CHF (congestive heart failure) ([place]) 03/15/2013  . MURMUR 03/02/2010  . Hypothyroidism 03/20/2009  . HYPERCHOLESTEROLEMIA  IIA 03/20/2009  . HYPOKALEMIA 03/20/2009  . HYPERTENSION, BENIGN 03/20/2009  . CAD, ARTERY BYPASS GRAFT 03/20/2009   Past Medical History:  Diagnosis Date  . Anemia    a. Noted on labs 04/2013.  . Anginal pain ([place])   . Arthritis    "legs" (06/10/2014)  . CAD (coronary artery disease)    a. s/p CABG 1992, b. s/p PCI several years ago; c. LHC 11/08: L-LAD ok, S-OM1 ok, S-OM2 CTO, prox to mid CFX 70% (unchanged), RCA non-dominant, occluded;  d.  Echo 2/11: EF 55%, mod LVH, Ao sclerosis;  e. Echo 6/14: mild LVH, mild focal basal sept hypertrophy, EF 55-65%, inf HK, Mild AI, mild MR, mild BAE;  f. Myoview 7/14: int risk-> cath 04/2013 s/p PTCA/DES to prox Cx 05/04/13.  . Chronic diastolic CHF (congestive heart failure) ([place])   . GERD (gastroesophageal reflux disease)   . Glaucoma   . High cholesterol   . Hypertension   . Hypokalemia 02/2017  . Hypothyroidism   . LBBB (left bundle branch block)    chronic  . Myocardial infarction ([place]) 03/1991 X 2; 07/1991  . Obesity   . Seizures ([place])    "I'm on Dilantin" (06/10/2014)  . Sickle cell trait ([place])   . Type II diabetes mellitus ([facility])     Family History  Problem Relation Age of Onset  . Diabetes Mother     Past Surgical History:  Procedure  Laterality Date  . CARDIAC CATHETERIZATION     "several;  they went up to check"  . CARDIAC CATHETERIZATION N/A 09/28/2016   Procedure: Left Heart Cath and Coronary Angiography;  Surgeon: Peter M Martinique, MD;  Location: [facility];  Service: Cardiovascular;  Laterality: N/A;  . CATARACT EXTRACTION W/ INTRAOCULAR LENS  IMPLANT, BILATERAL Bilateral   . CHOLECYSTECTOMY    . COLONOSCOPY N/A 06/12/2014   Procedure: COLONOSCOPY;  Surgeon: Cleotis Nipper, MD;  Location: [facility];  Service: Endoscopy;  Laterality: N/A;  . CORONARY ANGIOPLASTY WITH STENT PLACEMENT  ? date; 05/04/2013   ? location; DES  to [place]  . CORONARY ARTERY BYPASS GRAFT  07/1991   "CABG X 3"  . EP IMPLANTABLE DEVICE N/A 09/29/2016   Procedure: Pacemaker Implant;  Surgeon: Will Meredith Leeds, MD;  Location: [facility];  Service: Cardiovascular;  Laterality: N/A;  . ESOPHAGOGASTRODUODENOSCOPY N/A 06/12/2014   Procedure: ESOPHAGOGASTRODUODENOSCOPY (EGD);  Surgeon: Cleotis Nipper, MD;  Location: [facility];  Service: Endoscopy;  Laterality: N/A;  . LEFT AND RIGHT HEART CATHETERIZATION WITH CORONARY ANGIOGRAM N/A 05/04/2013   Procedure: LEFT AND RIGHT HEART CATHETERIZATION WITH CORONARY ANGIOGRAM;  Surgeon: Larey Dresser, MD;  Location: [facility] C[facility] LAB;  Service: Cardiovascular;  Laterality: N/A;  . LEFT HEART CATH AND CORS/GRAFTS ANGIOGRAPHY N/A 03/22/2018   Procedure: LEFT HEART CATH AND CORS/GRAFTS ANGIOGRAPHY;  Surgeon: Sherren Mocha, MD;  Location: [facility];  Service: Cardiovascular;  Laterality: N/A;  . PERCUTANEOUS STENT INTERVENTION  05/04/2013   Procedure: PERCUTANEOUS STENT INTERVENTION;  Surgeon: Larey Dresser, MD;  Location: [facility] C[facility] LAB;  Service: Cardiovascular;;  . TUBAL LIGATION Bilateral 1969   Social History   Occupational History  . Not on file  Tobacco Use  . Smoking status: Never Smoker  . Smokeless tobacco: Never Used  Substance and Sexual Activity  . Alcohol use: No  .  Drug use: No  . Sexual activity: Never

## 2019-01-24 ENCOUNTER — Encounter: Payer: Self-pay | Admitting: Cardiology

## 2019-01-24 NOTE — Progress Notes (Signed)
Remote pacemaker transmission.   

## 2019-01-29 ENCOUNTER — Telehealth: Payer: Self-pay

## 2019-01-29 NOTE — Telephone Encounter (Signed)

## 2019-01-30 ENCOUNTER — Ambulatory Visit (INDEPENDENT_AMBULATORY_CARE_PROVIDER_SITE_OTHER): Payer: Medicare Other | Admitting: *Deleted

## 2019-01-30 ENCOUNTER — Other Ambulatory Visit: Payer: Self-pay | Admitting: *Deleted

## 2019-01-30 DIAGNOSIS — I480 Paroxysmal atrial fibrillation: Secondary | ICD-10-CM | POA: Diagnosis not present

## 2019-01-30 DIAGNOSIS — Z5181 Encounter for therapeutic drug level monitoring: Secondary | ICD-10-CM | POA: Diagnosis not present

## 2019-01-30 DIAGNOSIS — I4891 Unspecified atrial fibrillation: Secondary | ICD-10-CM

## 2019-01-30 LAB — POCT INR: INR: 1.5 — AB (ref 2.0–3.0)

## 2019-01-30 MED ORDER — WARFARIN SODIUM 5 MG PO TABS: ORAL_TABLET | ORAL | 2 refills | Status: DC

## 2019-01-31 ENCOUNTER — Other Ambulatory Visit: Payer: Self-pay

## 2019-02-07 ENCOUNTER — Other Ambulatory Visit: Payer: Self-pay | Admitting: Internal Medicine

## 2019-02-12 ENCOUNTER — Telehealth: Payer: Self-pay

## 2019-02-12 NOTE — Telephone Encounter (Signed)

## 2019-02-13 ENCOUNTER — Other Ambulatory Visit: Payer: Self-pay

## 2019-02-13 ENCOUNTER — Ambulatory Visit (INDEPENDENT_AMBULATORY_CARE_PROVIDER_SITE_OTHER): Payer: Medicare Other | Admitting: *Deleted

## 2019-02-13 DIAGNOSIS — Z5181 Encounter for therapeutic drug level monitoring: Secondary | ICD-10-CM

## 2019-02-13 DIAGNOSIS — I4891 Unspecified atrial fibrillation: Secondary | ICD-10-CM | POA: Diagnosis not present

## 2019-02-13 LAB — POCT INR: INR: 1.7 — AB (ref 2.0–3.0)

## 2019-02-13 NOTE — Patient Instructions (Signed)
Description    Spoke with pt and instructed her to take 1.5 tabs today and then changed dose to 1.5 tablets daily, except for 1 tablet on Tuesdays. Recheck in 2 weeks.  [facility] [phone] Main [phone]

## 2019-02-22 ENCOUNTER — Telehealth: Payer: Self-pay | Admitting: Pharmacist

## 2019-02-22 NOTE — Telephone Encounter (Signed)
1. COVID-19 Pre-Screening Questions:  . In the past 7 to 10 days have you had a cough,  shortness of breath, headache, congestion, fever (100 or greater) body aches, chills, sore throat, or sudden loss of taste or sense of smell? no . Have you been around anyone with known Covid 19. no . Have you been around anyone who is awaiting Covid 19 test results in the past 7 to 10 days? no . Have you been around anyone who has been exposed to Covid 19, or has mentioned symptoms of Covid 19 within the past 7 to 10 days? no   2. Pt advised of visitor restrictions (no visitors allowed except if needed to conduct the visit). Also advised to arrive at appointment time and wear a mask.   3. Patient aware of in-office visit   

## 2019-02-27 ENCOUNTER — Other Ambulatory Visit: Payer: Self-pay

## 2019-02-27 ENCOUNTER — Ambulatory Visit (INDEPENDENT_AMBULATORY_CARE_PROVIDER_SITE_OTHER): Payer: Medicare Other

## 2019-02-27 DIAGNOSIS — I4891 Unspecified atrial fibrillation: Secondary | ICD-10-CM

## 2019-02-27 DIAGNOSIS — Z5181 Encounter for therapeutic drug level monitoring: Secondary | ICD-10-CM | POA: Diagnosis not present

## 2019-02-27 LAB — POCT INR: INR: 1.8 — AB (ref 2.0–3.0)

## 2019-02-27 NOTE — Patient Instructions (Signed)
Description   Take 2 tablets today, then start taking 1.5 tablets daily. Recheck in 3 weeks.  [facility] [phone] Main [phone]

## 2019-03-15 ENCOUNTER — Telehealth: Payer: Self-pay

## 2019-03-15 NOTE — Telephone Encounter (Signed)

## 2019-03-19 ENCOUNTER — Other Ambulatory Visit: Payer: Self-pay | Admitting: Cardiology

## 2019-03-22 ENCOUNTER — Ambulatory Visit (INDEPENDENT_AMBULATORY_CARE_PROVIDER_SITE_OTHER): Payer: Medicare Other | Admitting: Pharmacist

## 2019-03-22 ENCOUNTER — Other Ambulatory Visit: Payer: Self-pay

## 2019-03-22 DIAGNOSIS — I4891 Unspecified atrial fibrillation: Secondary | ICD-10-CM

## 2019-03-22 DIAGNOSIS — Z5181 Encounter for therapeutic drug level monitoring: Secondary | ICD-10-CM | POA: Diagnosis not present

## 2019-03-22 LAB — POCT INR: INR: 2.5 (ref 2.0–3.0)

## 2019-03-22 NOTE — Patient Instructions (Signed)
Description   Continue taking 1.5 tablets daily. Recheck in 4 weeks.  [facility] [phone] Main [phone]

## 2019-03-28 ENCOUNTER — Encounter: Payer: Self-pay | Admitting: Podiatry

## 2019-03-28 ENCOUNTER — Ambulatory Visit (INDEPENDENT_AMBULATORY_CARE_PROVIDER_SITE_OTHER): Payer: Medicare Other | Admitting: Podiatry

## 2019-03-28 ENCOUNTER — Other Ambulatory Visit: Payer: Self-pay

## 2019-03-28 DIAGNOSIS — B351 Tinea unguium: Secondary | ICD-10-CM | POA: Insufficient documentation

## 2019-03-28 DIAGNOSIS — M79675 Pain in left toe(s): Secondary | ICD-10-CM

## 2019-03-28 DIAGNOSIS — D689 Coagulation defect, unspecified: Secondary | ICD-10-CM | POA: Insufficient documentation

## 2019-03-28 DIAGNOSIS — M79674 Pain in right toe(s): Secondary | ICD-10-CM | POA: Diagnosis not present

## 2019-03-28 NOTE — Progress Notes (Signed)
Patient ID: Christine Gay, female   DOB: 09/21/1941, 78 y.o.   MRN: [id number] Complaint:  Visit Type: Patient returns to my office for continued preventative foot care services. Complaint: Patient states" my nails have grown long and thick and become painful to walk and wear shoes" Patient has been diagnosed with DM with no foot complications. The patient presents for preventative foot care services. No changes to ROS  Podiatric Exam: Vascular: dorsalis pedis and posterior tibial pulses are palpable bilateral. Capillary return is immediate. Temperature gradient is WNL. Skin turgor WNL  Sensorium: Diminished  Semmes Weinstein monofilament test. Normal tactile sensation bilaterally. Nail Exam: Pt has thick disfigured discolored nails with subungual debris noted bilateral entire nail hallux through fifth toenails Ulcer Exam: There is no evidence of ulcer or pre-ulcerative changes or infection. Orthopedic Exam: Muscle tone and strength are WNL. No limitations in general ROM. No crepitus or effusions noted. Foot type and digits show no abnormalities. Bony prominences are unremarkable. HAV 1st  MPJ B/L.  Hammer toes 2 B/l Skin: No Porokeratosis. No infection or ulcers.  Asymptomatic Callus sub 5th B/L  Diagnosis:  Onychomycosis, , Pain in right toe, pain in left toes,   Treatment & Plan Procedures and Treatment: Consent by patient was obtained for treatment procedures. The patient understood the discussion of treatment and procedures well. All questions were answered thoroughly reviewed. Debridement of mycotic and hypertrophic toenails, 1 through 5 bilateral and clearing of subungual debris. No ulceration, no infection noted. Patient to see Liliane Channel for diabetic shoes next visit.  Patient was appointed on Tuesday for an appointment. Return Visit-Office Procedure: Patient instructed to return to the office for a follow up visit 3 months for continued evaluation and treatment.  Gardiner Barefoot DPM

## 2019-04-02 ENCOUNTER — Other Ambulatory Visit: Payer: Self-pay | Admitting: Internal Medicine

## 2019-04-04 ENCOUNTER — Emergency Department (HOSPITAL_COMMUNITY)
Admission: EM | Admit: 2019-04-04 | Discharge: 2019-04-04 | Disposition: A | Discharge status: Home / Self Care | Payer: Medicare Other | Attending: Emergency Medicine | Admitting: Emergency Medicine

## 2019-04-04 ENCOUNTER — Encounter (HOSPITAL_COMMUNITY): Payer: Self-pay

## 2019-04-04 ENCOUNTER — Emergency Department (HOSPITAL_COMMUNITY): Payer: Medicare Other

## 2019-04-04 ENCOUNTER — Other Ambulatory Visit: Payer: Self-pay

## 2019-04-04 DIAGNOSIS — Z7984 Long term (current) use of oral hypoglycemic drugs: Secondary | ICD-10-CM | POA: Diagnosis not present

## 2019-04-04 DIAGNOSIS — S2242XA Multiple fractures of ribs, left side, initial encounter for closed fracture: Secondary | ICD-10-CM | POA: Diagnosis not present

## 2019-04-04 DIAGNOSIS — E119 Type 2 diabetes mellitus without complications: Secondary | ICD-10-CM | POA: Insufficient documentation

## 2019-04-04 DIAGNOSIS — I110 Hypertensive heart disease with heart failure: Secondary | ICD-10-CM | POA: Insufficient documentation

## 2019-04-04 DIAGNOSIS — Z7901 Long term (current) use of anticoagulants: Secondary | ICD-10-CM | POA: Diagnosis not present

## 2019-04-04 DIAGNOSIS — Y939 Activity, unspecified: Secondary | ICD-10-CM | POA: Insufficient documentation

## 2019-04-04 DIAGNOSIS — S299XXA Unspecified injury of thorax, initial encounter: Secondary | ICD-10-CM | POA: Diagnosis present

## 2019-04-04 DIAGNOSIS — E039 Hypothyroidism, unspecified: Secondary | ICD-10-CM | POA: Diagnosis not present

## 2019-04-04 DIAGNOSIS — Z79899 Other long term (current) drug therapy: Secondary | ICD-10-CM | POA: Insufficient documentation

## 2019-04-04 DIAGNOSIS — I252 Old myocardial infarction: Secondary | ICD-10-CM | POA: Diagnosis not present

## 2019-04-04 DIAGNOSIS — Y999 Unspecified external cause status: Secondary | ICD-10-CM | POA: Diagnosis not present

## 2019-04-04 DIAGNOSIS — Y929 Unspecified place or not applicable: Secondary | ICD-10-CM | POA: Diagnosis not present

## 2019-04-04 DIAGNOSIS — I5032 Chronic diastolic (congestive) heart failure: Secondary | ICD-10-CM | POA: Insufficient documentation

## 2019-04-04 DIAGNOSIS — Z951 Presence of aortocoronary bypass graft: Secondary | ICD-10-CM | POA: Insufficient documentation

## 2019-04-04 DIAGNOSIS — I2510 Atherosclerotic heart disease of native coronary artery without angina pectoris: Secondary | ICD-10-CM | POA: Insufficient documentation

## 2019-04-04 DIAGNOSIS — W19XXXA Unspecified fall, initial encounter: Secondary | ICD-10-CM

## 2019-04-04 DIAGNOSIS — W010XXA Fall on same level from slipping, tripping and stumbling without subsequent striking against object, initial encounter: Secondary | ICD-10-CM | POA: Diagnosis not present

## 2019-04-04 LAB — CBC WITH DIFFERENTIAL/PLATELET
Abs Immature Granulocytes: 0.02 10*3/uL (ref 0.00–0.07)
Basophils Absolute: 0.1 10*3/uL (ref 0.0–0.1)
Basophils Relative: 1 %
Eosinophils Absolute: 0.1 10*3/uL (ref 0.0–0.5)
Eosinophils Relative: 2 %
HCT: 35.1 % — ABNORMAL LOW (ref 36.0–46.0)
Hemoglobin: 11.7 g/dL — ABNORMAL LOW (ref 12.0–15.0)
Immature Granulocytes: 0 %
Lymphocytes Relative: 30 %
Lymphs Abs: 1.6 10*3/uL (ref 0.7–4.0)
MCH: 33.1 pg (ref 26.0–34.0)
MCHC: 33.3 g/dL (ref 30.0–36.0)
MCV: 99.4 fL (ref 80.0–100.0)
Monocytes Absolute: 0.5 10*3/uL (ref 0.1–1.0)
Monocytes Relative: 10 %
Neutro Abs: 3 10*3/uL (ref 1.7–7.7)
Neutrophils Relative %: 57 %
Platelets: 150 10*3/uL (ref 150–400)
RBC: 3.53 MIL/uL — ABNORMAL LOW (ref 3.87–5.11)
RDW: 11.9 % (ref 11.5–15.5)
WBC: 5.4 10*3/uL (ref 4.0–10.5)
nRBC: 0 % (ref 0.0–0.2)

## 2019-04-04 LAB — BASIC METABOLIC PANEL
Anion gap: 10 (ref 5–15)
BUN: 11 mg/dL (ref 8–23)
CO2: 23 mmol/L (ref 22–32)
Calcium: 7.4 mg/dL — ABNORMAL LOW (ref 8.9–10.3)
Chloride: 104 mmol/L (ref 98–111)
Creatinine, Ser: 0.85 mg/dL (ref 0.44–1.00)
GFR calc Af Amer: >60 mL/min (ref >60)
GFR calc non Af Amer: >60 mL/min (ref >60)
Glucose, Bld: 95 mg/dL (ref 70–99)
Potassium: 5.2 mmol/L — ABNORMAL HIGH (ref 3.5–5.1)
Sodium: 137 mmol/L (ref 135–145)

## 2019-04-04 LAB — PROTIME-INR
INR: 3.5 — ABNORMAL HIGH (ref 0.8–1.2)
Prothrombin Time: 34.4 seconds — ABNORMAL HIGH (ref 11.4–15.2)

## 2019-04-04 MED ORDER — FUROSEMIDE 20 MG PO TABS: 20.0000 mg | ORAL_TABLET | Freq: Once | ORAL | Status: DC

## 2019-04-04 MED ORDER — ENOXAPARIN SODIUM 120 MG/0.8ML ~~LOC~~ SOLN: 120.0000 mg | SUBCUTANEOUS | Status: DC

## 2019-04-04 MED ORDER — WARFARIN SODIUM 7.5 MG PO TABS: 7.5000 mg | ORAL_TABLET | Freq: Every day | ORAL | Status: DC

## 2019-04-04 MED ORDER — ACETAMINOPHEN 500 MG PO TABS
500.0000 mg | ORAL_TABLET | Freq: Four times a day (QID) | ORAL | Status: DC | PRN
  Administered 2019-04-04: 500 mg via ORAL
  Filled 2019-04-04: qty 1

## 2019-04-04 MED ORDER — PHENYTOIN SODIUM EXTENDED 100 MG PO CAPS: 200.0000 mg | ORAL_CAPSULE | Freq: Once | ORAL | Status: DC

## 2019-04-04 MED ORDER — FENTANYL CITRATE (PF) 100 MCG/2ML IJ SOLN
12.5000 ug | Freq: Once | INTRAMUSCULAR | Status: DC
  Filled 2019-04-04: qty 2

## 2019-04-04 MED ORDER — FENTANYL CITRATE (PF) 100 MCG/2ML IJ SOLN
12.5000 ug | Freq: Once | INTRAMUSCULAR | Status: AC
  Administered 2019-04-04: 12.5 ug via INTRAMUSCULAR

## 2019-04-04 MED ORDER — ROSUVASTATIN CALCIUM 20 MG PO TABS: 40.0000 mg | ORAL_TABLET | Freq: Every day | ORAL | Status: DC

## 2019-04-04 MED ORDER — EZETIMIBE 10 MG PO TABS: 10.0000 mg | ORAL_TABLET | Freq: Every day | ORAL | Status: DC

## 2019-04-04 MED ORDER — METFORMIN HCL ER 500 MG PO TB24: 500.0000 mg | ORAL_TABLET | Freq: Two times a day (BID) | ORAL | Status: DC

## 2019-04-04 MED ORDER — DICLOFENAC EPOLAMINE 1.3 % TD PTCH: 1.0000 | MEDICATED_PATCH | Freq: Two times a day (BID) | TRANSDERMAL | 1 refills | Status: DC

## 2019-04-04 MED ORDER — DICLOFENAC EPOLAMINE 1.3 % TD PTCH
1.0000 | MEDICATED_PATCH | Freq: Two times a day (BID) | TRANSDERMAL | Status: DC
  Administered 2019-04-04: 1 via TRANSDERMAL
  Filled 2019-04-04 (×2): qty 1

## 2019-04-04 MED ORDER — ISOSORBIDE MONONITRATE ER 30 MG PO TB24: 60.0000 mg | ORAL_TABLET | Freq: Two times a day (BID) | ORAL | Status: DC

## 2019-04-04 MED ORDER — CARVEDILOL 12.5 MG PO TABS: 6.2500 mg | ORAL_TABLET | Freq: Once | ORAL | Status: DC

## 2019-04-04 NOTE — Discharge Instructions (Addendum)
As discussed, it is normal to feel pain and soreness following a fall with rib fractures. You have 2 rib fractures on the left side. Please use the provided topical medication patches, switching them every 12 hours for the next 3 days for pain relief as needed. Return here for concerning changes or follow-up with your physician.

## 2019-04-04 NOTE — ED Provider Notes (Signed)
[facility] EMERGENCY DEPARTMENT Provider Note   CSN: [id number] Arrival date & time: 04/04/19  1026     History   Chief Complaint Chief Complaint  Patient presents with  . Fall    HPI Christine Gay is a 78 y.o. female.     HPI Patient presents 2 days after mechanical fall with pain in her left axilla. Pain is focal, nonradiating, worse with palpation, motion, deep inspiration or coughing. Pain is sore, severe, transiently improved with Tylenol. No head trauma, she recalls the file in its entirety, denies any weakness, or other changes from baseline medical condition.  Past Medical History:  Diagnosis Date  . Anemia    a. Noted on labs 04/2013.  . Anginal pain ([place])   . Arthritis    "legs" (06/10/2014)  . CAD (coronary artery disease)    a. s/p CABG 1992, b. s/p PCI several years ago; c. LHC 11/08: L-LAD ok, S-OM1 ok, S-OM2 CTO, prox to mid CFX 70% (unchanged), RCA non-dominant, occluded;  d.  Echo 2/11: EF 55%, mod LVH, Ao sclerosis;  e. Echo 6/14: mild LVH, mild focal basal sept hypertrophy, EF 55-65%, inf HK, Mild AI, mild MR, mild BAE;  f. Myoview 7/14: int risk-> cath 04/2013 s/p PTCA/DES to prox Cx 05/04/13.  . Chronic diastolic CHF (congestive heart failure) ([place])   . GERD (gastroesophageal reflux disease)   . Glaucoma   . High cholesterol   . Hypertension   . Hypokalemia 02/2017  . Hypothyroidism   . LBBB (left bundle branch block)    chronic  . Myocardial infarction ([place]) 03/1991 X 2; 07/1991  . Obesity   . Seizures ([place])    "I'm on Dilantin" (06/10/2014)  . Sickle cell trait ([place])   . Type II diabetes mellitus [facility])     Patient Active Problem List   Diagnosis Date Noted  . Pain due to onychomycosis of toenails of both feet 03/28/2019  . Coagulation disorder ([place]) 03/28/2019  . Chronic anticoagulation 03/23/2018  . Pacemaker 03/23/2018  . Abnormal nuclear cardiac imaging test   . NSTEMI (non-ST elevated myocardial infarction) ([place])  03/18/2018  . Shoulder pain, left 03/09/2017  . Seizure ([place])   . Chest pain 03/08/2017  . Muscle spasm 03/08/2017  . Hypocalcemia 03/08/2017  . Encounter for therapeutic drug monitoring 02/08/2017  . Atrial fibrillation ([place]) 02/08/2017  . Complete heart block ([place]) 09/28/2016  . Third degree heart block ([place])   . Unstable angina ([place])   . CAD (coronary artery disease) 11/11/2014  . Dizziness 07/29/2014  . GI bleed 06/12/2014  . GERD (gastroesophageal reflux disease) 06/11/2014  . Type II or unspecified type diabetes mellitus without mention of complication, not stated as uncontrolled 06/11/2014  . Glaucoma 06/11/2014  . Microcytic hypochromic anemia 06/10/2014  . Chronic diastolic CHF (congestive heart failure) ([place]) 08/09/2013  . Hypotension 05/18/2013  . Exertional dyspnea 03/15/2013  . CHF (congestive heart failure) ([place]) 03/15/2013  . MURMUR 03/02/2010  . Hypothyroidism 03/20/2009  . HYPERCHOLESTEROLEMIA  IIA 03/20/2009  . HYPOKALEMIA 03/20/2009  . HYPERTENSION, BENIGN 03/20/2009  . CAD, ARTERY BYPASS GRAFT 03/20/2009    Past Surgical History:  Procedure Laterality Date  . CARDIAC CATHETERIZATION     "several;  they went up to check"  . CARDIAC CATHETERIZATION N/A 09/28/2016   Procedure: Left Heart Cath and Coronary Angiography;  Surgeon: Peter M Martinique, MD;  Location: [facility] CV LAB;  Service: Cardiovascular;  Laterality: N/A;  . CATARACT EXTRACTION W/ INTRAOCULAR LENS  IMPLANT, BILATERAL Bilateral   . CHOLECYSTECTOMY    . COLONOSCOPY N/A 06/12/2014   Procedure: COLONOSCOPY;  Surgeon: Cleotis Nipper, MD;  Location: [facility] ENDOSCOPY;  Service: Endoscopy;  Laterality: N/A;  . CORONARY ANGIOPLASTY WITH STENT PLACEMENT  ? date; 05/04/2013   ? location; DES  to [place]  . CORONARY ARTERY BYPASS GRAFT  07/1991   "CABG X 3"  . EP IMPLANTABLE DEVICE N/A 09/29/2016   Procedure: Pacemaker Implant;  Surgeon: Will Meredith Leeds, MD;  Location: [facility] CV LAB;  Service:  Cardiovascular;  Laterality: N/A;  . ESOPHAGOGASTRODUODENOSCOPY N/A 06/12/2014   Procedure: ESOPHAGOGASTRODUODENOSCOPY (EGD);  Surgeon: Cleotis Nipper, MD;  Location: [facility] ENDOSCOPY;  Service: Endoscopy;  Laterality: N/A;  . LEFT AND RIGHT HEART CATHETERIZATION WITH CORONARY ANGIOGRAM N/A 05/04/2013   Procedure: LEFT AND RIGHT HEART CATHETERIZATION WITH CORONARY ANGIOGRAM;  Surgeon: Larey Dresser, MD;  Location: [facility] CATH LAB;  Service: Cardiovascular;  Laterality: N/A;  . LEFT HEART CATH AND CORS/GRAFTS ANGIOGRAPHY N/A 03/22/2018   Procedure: LEFT HEART CATH AND CORS/GRAFTS ANGIOGRAPHY;  Surgeon: Sherren Mocha, MD;  Location: [facility] CV LAB;  Service: Cardiovascular;  Laterality: N/A;  . PERCUTANEOUS STENT INTERVENTION  05/04/2013   Procedure: PERCUTANEOUS STENT INTERVENTION;  Surgeon: Larey Dresser, MD;  Location: [facility] CATH LAB;  Service: Cardiovascular;;  . TUBAL LIGATION Bilateral 1969     OB History   No obstetric history on file.      Home Medications    Prior to Admission medications   Medication Sig Start Date End Date Taking? Authorizing Provider  acetaminophen (TYLENOL) 325 MG tablet Take 1-2 tablets (325-650 mg total) by mouth every 4 (four) hours as needed for mild pain. 09/30/16  Yes Shirley Friar, PA-C  warfarin (COUMADIN) 5 MG tablet TAKE AS DIRECTED BY COUMADIN CLINIC Patient taking differently: Take 7.5 mg by mouth daily.  01/30/19  Yes Camnitz, Will Hassell Done, MD  carvedilol (COREG) 6.25 MG tablet TAKE 1/2 TABLET BY MOUTH 2 TIMES A DAY WITH A MEAL. 03/19/19   Larey Dresser, MD  cycloSPORINE (RESTASIS) 0.05 % ophthalmic emulsion Place 1 drop into both eyes 2 (two) times daily.    [provider]  enoxaparin (LOVENOX) 120 MG/0.8ML injection Inject 0.8 mLs (120 mg total) into the skin daily. 12/07/18   Larey Dresser, MD  ezetimibe (ZETIA) 10 MG tablet TAKE 1 TABLET BY MOUTH DAILY. 01/17/19   Larey Dresser, MD  ferrous sulfate 325 (65 FE) MG tablet Take  325 mg by mouth daily with breakfast.    [provider]  furosemide (LASIX) 40 MG tablet TAKE 1 AND 1/2 TABLETS BY MOUTH 2 TIMES DAILY. 02/07/19   Bensimhon, Shaune Pascal, MD  glucose blood test strip Test blood glucose three times daily. 06/30/17   Larey Dresser, MD  ibandronate (BONIVA) 150 MG tablet Take 150 mg by mouth every 30 (thirty) days. Take in the morning with a full glass of water, on an empty stomach, and do not take anything else by mouth or lie down for the next 30 min.    [provider]  isosorbide mononitrate (IMDUR) 60 MG 24 hr tablet Take 1 tablet (60 mg total) by mouth 2 (two) times daily. 11/29/18   Larey Dresser, MD  levothyroxine (SYNTHROID, LEVOTHROID) 100 MCG tablet Take 100 mcg by mouth daily before breakfast.    [provider]  metFORMIN (GLUCOPHAGE-XR) 500 MG 24 hr tablet Take 500 mg by mouth 2 (two) times daily.  05/08/13   Dunn, Nedra Hai, PA-C  nitroGLYCERIN (NITROSTAT) 0.4 MG SL tablet PLACE 1 TABLET (0.4 MG TOTAL) UNDER THE TONGUE EVERY 5 (FIVE) MINUTES AS NEEDED FOR CHEST PAIN. 05/12/18   Larey Dresser, MD  pantoprazole (PROTONIX) 40 MG tablet TAKE 1 TABLET BY MOUTH DAILY. 08/17/18   Larey Dresser, MD  phenytoin (DILANTIN) 100 MG ER capsule Take 100-200 mg by mouth 2 (two) times daily. 200 mg in the morning and 100 mg in the evening    [provider]  potassium chloride SA (K-DUR,KLOR-CON) 20 MEQ tablet TAKE 1 TABLET BY MOUTH 2 TIMES DAILY. 11/09/18   Larey Dresser, MD  rosuvastatin (CRESTOR) 40 MG tablet Take 1 tablet (40 mg total) by mouth daily. Please schedule an appointment for further refills 04/03/19   Bensimhon, Shaune Pascal, MD  timolol (TIMOPTIC) 0.5 % ophthalmic solution Place 1 drop into both eyes daily.  09/21/16   [provider]    Family History Family History  Problem Relation Age of Onset  . Diabetes Mother     Social History Social History   Tobacco Use  . Smoking status: Never Smoker  .  Smokeless tobacco: Never Used  Substance Use Topics  . Alcohol use: No  . Drug use: No     Allergies   Meperidine hcl, Nitroglycerin, and Morphine   Review of Systems Review of Systems  Constitutional:       Per HPI, otherwise negative  HENT:       Per HPI, otherwise negative  Respiratory:       Per HPI, otherwise negative  Cardiovascular:       Per HPI, otherwise negative  Gastrointestinal: Negative for vomiting.  Endocrine:       Negative aside from HPI  Genitourinary:       Neg aside from HPI   Musculoskeletal:       Per HPI, otherwise negative  Skin: Negative.   Neurological: Negative for syncope.     Physical Exam Updated Vital Signs BP (!) 118/25   Pulse 61   Temp 97.8 F (36.6 C) (Oral)   Resp 16   Ht 5' (1.524 m)   Wt 73.9 kg   SpO2 98%   BMI 31.83 kg/m   Physical Exam Vitals signs and nursing note reviewed.  Constitutional:      General: She is not in acute distress.    Appearance: She is well-developed.  HENT:     Head: Normocephalic and atraumatic.  Eyes:     Conjunctiva/sclera: Conjunctivae normal.  Cardiovascular:     Rate and Rhythm: Normal rate and regular rhythm.  Pulmonary:     Effort: Pulmonary effort is normal. No respiratory distress.     Breath sounds: Normal breath sounds. No stridor.  Chest:    Abdominal:     General: There is no distension.  Skin:    General: Skin is warm and dry.  Neurological:     Mental Status: She is alert and oriented to person, place, and time.     Cranial Nerves: No cranial nerve deficit.      ED Treatments / Results  Labs (all labs ordered are listed, but only abnormal results are displayed) Labs Reviewed  BASIC METABOLIC PANEL - Abnormal; Notable for the following components:      Result Value   Potassium 5.2 (*)    Calcium 7.4 (*)    All other components within normal limits  CBC WITH DIFFERENTIAL/PLATELET - Abnormal; Notable for  the following components:   RBC 3.53 (*)     Hemoglobin 11.7 (*)    HCT 35.1 (*)    All other components within normal limits  PROTIME-INR - Abnormal; Notable for the following components:   Prothrombin Time 34.4 (*)    INR 3.5 (*)    All other components within normal limits    EKG EKG Interpretation  Date/Time:  Wednesday April 04 2019 10:32:14 EDT Ventricular Rate:  60 PR Interval:    QRS Duration: 165 QT Interval:  501 QTC Calculation: 501 R Axis:   -53 Text Interpretation:  VENTRICULAR PACED RHYTHM Abnormal ECG Confirmed by Carmin Muskrat [phone]) on 04/04/2019 10:36:48 AM   Radiology Dg Ribs Unilateral W/chest Left  Result Date: 04/04/2019 CLINICAL DATA:  Chest pain after fall. EXAM: LEFT RIBS AND CHEST - 3+ VIEW COMPARISON:  Radiographs of March 18, 2018. FINDINGS: No fracture or other bone lesions are seen involving the ribs. There is no evidence of pneumothorax or pleural effusion. Both lungs are clear. Stable cardiomegaly. IMPRESSION: Normal left ribs.  No acute cardiopulmonary abnormality seen. Electronically Signed   By: Marijo Conception M.D.   On: 04/04/2019 11:51   Ct Chest Wo Contrast  Result Date: 04/04/2019 CLINICAL DATA:  Pain with deep breath. Fall onto left side 2 days ago. EXAM: CT CHEST WITHOUT CONTRAST TECHNIQUE: Multidetector CT imaging of the chest was performed following the standard protocol without IV contrast. COMPARISON:  Chest x-ray March 18, 2018 FINDINGS: Cardiovascular: Cardiomegaly is identified. Coronary artery calcifications are noted. The thoracic aorta is nonaneurysmal. The main pulmonary artery measures 3.6 cm. Mediastinum/Nodes: 2 prominent retrocrural nodes are identified. A representative node on series 3, image 120 measures 8 mm. No other adenopathy identified on today's study. No effusions. The distal esophagus is normal. No other acute abnormalities. Lungs/Pleura: Central airways are normal. No pneumothorax. No pulmonary nodules, masses, or focal infiltrates. Upper Abdomen: Cholecystectomy  clips are noted. No other abnormalities are seen in the upper abdomen. Musculoskeletal: Fractures are seen through the anterolateral left 6 and seventh ribs. No other bony abnormalities. IMPRESSION: 1. Fractures through the anterolateral left sixth and seventh ribs without pneumothorax. 2. Cardiomegaly. 3. Coronary artery calcifications. Atherosclerotic changes in the thoracic aorta. 4. Dilatation of the main pulmonary artery measuring 3.6 cm raising the possibility of pulmonary arterial hypertension. 5. Two prominent retrocrural nodes may be reactive but are nonspecific. 6. No other abnormalities. Electronically Signed   By: Dorise Bullion III M.D   On: 04/04/2019 13:15    Procedures Procedures (including critical care time)  Medications Ordered in ED Medications - No data to display   Initial Impression / Assessment and Plan / ED Course  I have reviewed the triage vital signs and the nursing notes.  Pertinent labs & imaging results that were available during my care of the patient were reviewed by me and considered in my medical decision making (see chart for details).    On repeat exam the patient is in no distress, but with point tenderness.  With x-rays that do not demonstrate pneumothorax, there is still suspicion for rib fracture, CT scan will be performed.    CT demonstrates fracture of left sixth and seventh ribs.  No pneumothorax.  Labs unremarkable, with mild hemolysis of potassium, unremarkable. On repeat exam patient is awake, alert, no hypoxia, no increased work of breathing. With a lengthy conversation about the findings, and with no decompensation, now 2 days after the fall, no indication for admission.  Patient  provided diclofenac, with first dose applied here for additional pain control.   Final Clinical Impressions(s) / ED Diagnoses   Final diagnoses:  Fall, initial encounter  Closed fracture of multiple ribs of left side, initial encounter     Carmin Muskrat, MD  04/04/19 1515

## 2019-04-04 NOTE — ED Notes (Signed)
Patient transported to CT 

## 2019-04-04 NOTE — ED Notes (Addendum)
Pt is sinus rhythm and AV paced on monitor

## 2019-04-04 NOTE — ED Triage Notes (Signed)
Pt fell on Monday (04/02/19), she denies feeling dizzy before it happen or any LOC, she landed on her Lt side, the floor was concrete. She endorses pain when she moves & takes a deep breath, no other concerns of SOB, afebrile, VSS, A/Ox4.

## 2019-04-16 ENCOUNTER — Ambulatory Visit (INDEPENDENT_AMBULATORY_CARE_PROVIDER_SITE_OTHER): Payer: Medicare Other | Admitting: *Deleted

## 2019-04-16 DIAGNOSIS — I442 Atrioventricular block, complete: Secondary | ICD-10-CM

## 2019-04-17 ENCOUNTER — Telehealth: Payer: Self-pay

## 2019-04-17 LAB — CUP PACEART REMOTE DEVICE CHECK
Date Time Interrogation Session: 20200721092700
Implantable Lead Implant Date: 20180103
Implantable Lead Implant Date: 20180103
Implantable Lead Location: 753859
Implantable Lead Location: 753860
Implantable Pulse Generator Implant Date: 20180103
Pulse Gen Model: 2272
Pulse Gen Serial Number: 7988263

## 2019-04-17 NOTE — Telephone Encounter (Signed)
lmom for prescreen  

## 2019-04-19 ENCOUNTER — Other Ambulatory Visit: Payer: Self-pay

## 2019-04-19 ENCOUNTER — Ambulatory Visit (INDEPENDENT_AMBULATORY_CARE_PROVIDER_SITE_OTHER): Payer: Medicare Other | Admitting: *Deleted

## 2019-04-19 DIAGNOSIS — Z5181 Encounter for therapeutic drug level monitoring: Secondary | ICD-10-CM

## 2019-04-19 DIAGNOSIS — I4891 Unspecified atrial fibrillation: Secondary | ICD-10-CM | POA: Diagnosis not present

## 2019-04-19 LAB — POCT INR: INR: 3.1 — AB (ref 2.0–3.0)

## 2019-04-19 NOTE — Patient Instructions (Signed)
Description    Take 1/2 a tablet today. Then continue taking 1.5 tablets daily. Recheck in 3 weeks.  [facility] [phone] Main [phone]

## 2019-05-02 ENCOUNTER — Encounter: Payer: Self-pay | Admitting: Cardiology

## 2019-05-02 NOTE — Progress Notes (Signed)
Remote pacemaker transmission.   

## 2019-05-03 ENCOUNTER — Other Ambulatory Visit (HOSPITAL_COMMUNITY): Payer: Self-pay | Admitting: Cardiology

## 2019-05-03 MED ORDER — ROSUVASTATIN CALCIUM 40 MG PO TABS: 40.0000 mg | ORAL_TABLET | Freq: Every day | ORAL | 3 refills | Status: DC

## 2019-05-09 ENCOUNTER — Other Ambulatory Visit (HOSPITAL_COMMUNITY): Payer: Self-pay | Admitting: Cardiology

## 2019-05-11 ENCOUNTER — Other Ambulatory Visit: Payer: Self-pay

## 2019-05-11 ENCOUNTER — Ambulatory Visit (INDEPENDENT_AMBULATORY_CARE_PROVIDER_SITE_OTHER): Payer: Medicare Other | Admitting: *Deleted

## 2019-05-11 DIAGNOSIS — Z5181 Encounter for therapeutic drug level monitoring: Secondary | ICD-10-CM

## 2019-05-11 DIAGNOSIS — I4891 Unspecified atrial fibrillation: Secondary | ICD-10-CM | POA: Diagnosis not present

## 2019-05-11 LAB — POCT INR: INR: 2.1 (ref 2.0–3.0)

## 2019-05-11 NOTE — Patient Instructions (Signed)
Description   Continue taking 1.5 tablets daily. Recheck in 4 weeks.  [facility] [phone] Main [phone]

## 2019-05-21 ENCOUNTER — Other Ambulatory Visit: Payer: Self-pay | Admitting: Cardiology

## 2019-06-08 ENCOUNTER — Other Ambulatory Visit: Payer: Self-pay

## 2019-06-08 ENCOUNTER — Ambulatory Visit (INDEPENDENT_AMBULATORY_CARE_PROVIDER_SITE_OTHER): Payer: Medicare Other | Admitting: *Deleted

## 2019-06-08 DIAGNOSIS — R931 Abnormal findings on diagnostic imaging of heart and coronary circulation: Secondary | ICD-10-CM

## 2019-06-08 DIAGNOSIS — I4891 Unspecified atrial fibrillation: Secondary | ICD-10-CM

## 2019-06-08 DIAGNOSIS — Z5181 Encounter for therapeutic drug level monitoring: Secondary | ICD-10-CM | POA: Diagnosis not present

## 2019-06-08 LAB — POCT INR: INR: 3.2 — AB (ref 2.0–3.0)

## 2019-06-08 NOTE — Patient Instructions (Signed)
Description    Hold today, then continue taking 1.5 tablets daily. Recheck in 3 weeks.  [facility] Clinic [phone] Main [phone]

## 2019-06-19 ENCOUNTER — Other Ambulatory Visit (HOSPITAL_COMMUNITY): Payer: Self-pay | Admitting: Cardiology

## 2019-06-22 ENCOUNTER — Ambulatory Visit: Payer: Medicare Other | Admitting: Podiatry

## 2019-06-26 ENCOUNTER — Other Ambulatory Visit: Payer: Self-pay

## 2019-06-26 ENCOUNTER — Ambulatory Visit: Payer: Medicare Other | Admitting: Orthotics

## 2019-06-26 ENCOUNTER — Ambulatory Visit (INDEPENDENT_AMBULATORY_CARE_PROVIDER_SITE_OTHER): Payer: Medicare Other | Admitting: Podiatry

## 2019-06-26 ENCOUNTER — Encounter: Payer: Self-pay | Admitting: Podiatry

## 2019-06-26 DIAGNOSIS — B351 Tinea unguium: Secondary | ICD-10-CM

## 2019-06-26 DIAGNOSIS — M79675 Pain in left toe(s): Secondary | ICD-10-CM | POA: Diagnosis not present

## 2019-06-26 DIAGNOSIS — D689 Coagulation defect, unspecified: Secondary | ICD-10-CM

## 2019-06-26 DIAGNOSIS — E1140 Type 2 diabetes mellitus with diabetic neuropathy, unspecified: Secondary | ICD-10-CM

## 2019-06-26 DIAGNOSIS — M79674 Pain in right toe(s): Secondary | ICD-10-CM

## 2019-06-26 DIAGNOSIS — M2041 Other hammer toe(s) (acquired), right foot: Secondary | ICD-10-CM

## 2019-06-26 DIAGNOSIS — M2042 Other hammer toe(s) (acquired), left foot: Secondary | ICD-10-CM

## 2019-06-26 NOTE — Progress Notes (Signed)
Patient ID: Christine Gay, female   DOB: 02-Apr-1941, 78 y.o.   MRN: [id number] Complaint:  Visit Type: Patient returns to my office for continued preventative foot care services. Complaint: Patient states" my nails have grown long and thick and become painful to walk and wear shoes" Patient has been diagnosed with DM with no foot complications. The patient presents for preventative foot care services. No changes to ROS  Podiatric Exam: Vascular: dorsalis pedis and posterior tibial pulses are palpable bilateral. Capillary return is immediate. Temperature gradient is WNL. Skin turgor WNL  Sensorium: Diminished  Semmes Weinstein monofilament test. Normal tactile sensation bilaterally. Nail Exam: Pt has thick disfigured discolored nails with subungual debris noted bilateral entire nail hallux through fifth toenails Ulcer Exam: There is no evidence of ulcer or pre-ulcerative changes or infection. Orthopedic Exam: Muscle tone and strength are WNL. No limitations in general ROM. No crepitus or effusions noted. Foot type and digits show no abnormalities. Bony prominences are unremarkable. HAV 1st  MPJ B/L.  Hammer toes 2 B/l Skin: No Porokeratosis. No infection or ulcers.  Asymptomatic Callus sub 5th B/L  Diagnosis:  Onychomycosis, , Pain in right toe, pain in left toes,   Treatment & Plan Procedures and Treatment: Consent by patient was obtained for treatment procedures. The patient understood the discussion of treatment and procedures well. All questions were answered thoroughly reviewed. Debridement of mycotic and hypertrophic toenails, 1 through 5 bilateral and clearing of subungual debris. No ulceration, no infection noted. Patient qualifies for diabetic shoes. Return Visit-Office Procedure: Patient instructed to return to the office for a follow up visit 3 months for continued evaluation and treatment.  Gardiner Barefoot DPM

## 2019-06-26 NOTE — Progress Notes (Signed)

## 2019-06-29 ENCOUNTER — Ambulatory Visit (HOSPITAL_COMMUNITY)
Admission: RE | Admit: 2019-06-29 | Discharge: 2019-06-29 | Disposition: A | Discharge status: Home / Self Care | Payer: Medicare Other | Source: Ambulatory Visit | Attending: Cardiology | Admitting: Cardiology

## 2019-06-29 ENCOUNTER — Telehealth (HOSPITAL_COMMUNITY): Payer: Self-pay

## 2019-06-29 ENCOUNTER — Encounter (HOSPITAL_COMMUNITY): Payer: Self-pay | Admitting: Cardiology

## 2019-06-29 ENCOUNTER — Other Ambulatory Visit: Payer: Self-pay

## 2019-06-29 VITALS — BP 126/62 | HR 60 | Wt 171.0 lb

## 2019-06-29 DIAGNOSIS — E7800 Pure hypercholesterolemia, unspecified: Secondary | ICD-10-CM

## 2019-06-29 DIAGNOSIS — Z951 Presence of aortocoronary bypass graft: Secondary | ICD-10-CM | POA: Diagnosis not present

## 2019-06-29 DIAGNOSIS — I2510 Atherosclerotic heart disease of native coronary artery without angina pectoris: Secondary | ICD-10-CM | POA: Diagnosis not present

## 2019-06-29 DIAGNOSIS — G40909 Epilepsy, unspecified, not intractable, without status epilepticus: Secondary | ICD-10-CM | POA: Insufficient documentation

## 2019-06-29 DIAGNOSIS — I5032 Chronic diastolic (congestive) heart failure: Secondary | ICD-10-CM | POA: Insufficient documentation

## 2019-06-29 DIAGNOSIS — Z7984 Long term (current) use of oral hypoglycemic drugs: Secondary | ICD-10-CM | POA: Diagnosis not present

## 2019-06-29 DIAGNOSIS — E119 Type 2 diabetes mellitus without complications: Secondary | ICD-10-CM | POA: Diagnosis not present

## 2019-06-29 DIAGNOSIS — Z7901 Long term (current) use of anticoagulants: Secondary | ICD-10-CM | POA: Diagnosis not present

## 2019-06-29 DIAGNOSIS — E039 Hypothyroidism, unspecified: Secondary | ICD-10-CM | POA: Diagnosis not present

## 2019-06-29 DIAGNOSIS — Z79899 Other long term (current) drug therapy: Secondary | ICD-10-CM | POA: Insufficient documentation

## 2019-06-29 DIAGNOSIS — Z955 Presence of coronary angioplasty implant and graft: Secondary | ICD-10-CM | POA: Insufficient documentation

## 2019-06-29 DIAGNOSIS — I480 Paroxysmal atrial fibrillation: Secondary | ICD-10-CM | POA: Diagnosis not present

## 2019-06-29 DIAGNOSIS — E785 Hyperlipidemia, unspecified: Secondary | ICD-10-CM | POA: Insufficient documentation

## 2019-06-29 DIAGNOSIS — H409 Unspecified glaucoma: Secondary | ICD-10-CM | POA: Diagnosis not present

## 2019-06-29 DIAGNOSIS — K219 Gastro-esophageal reflux disease without esophagitis: Secondary | ICD-10-CM | POA: Diagnosis not present

## 2019-06-29 DIAGNOSIS — I110 Hypertensive heart disease with heart failure: Secondary | ICD-10-CM | POA: Diagnosis not present

## 2019-06-29 DIAGNOSIS — I4891 Unspecified atrial fibrillation: Secondary | ICD-10-CM

## 2019-06-29 LAB — BASIC METABOLIC PANEL
Anion gap: 8 (ref 5–15)
BUN: 11 mg/dL (ref 8–23)
CO2: 25 mmol/L (ref 22–32)
Calcium: 8.2 mg/dL — ABNORMAL LOW (ref 8.9–10.3)
Chloride: 105 mmol/L (ref 98–111)
Creatinine, Ser: 0.96 mg/dL (ref 0.44–1.00)
GFR calc Af Amer: >60 mL/min (ref >60)
GFR calc non Af Amer: 57 mL/min — ABNORMAL LOW (ref >60)
Glucose, Bld: 125 mg/dL — ABNORMAL HIGH (ref 70–99)
Potassium: 4.7 mmol/L (ref 3.5–5.1)
Sodium: 138 mmol/L (ref 135–145)

## 2019-06-29 LAB — LIPID PANEL
Cholesterol: 151 mg/dL (ref 0–200)
HDL: 52 mg/dL (ref >40)
LDL Cholesterol: 86 mg/dL (ref 0–99)
Total CHOL/HDL Ratio: 2.9 RATIO
Triglycerides: 66 mg/dL (ref <150)
VLDL: 13 mg/dL (ref 0–40)

## 2019-06-29 NOTE — Patient Instructions (Signed)
EKG done today in office  Labs today We will only contact you if something comes back abnormal or we need to make some changes. Otherwise no news is good news!  Marland KitchenYour physician recommends that you schedule a follow-up appointment in: 4 months with Dr Aundra Dubin.  We do not have our schedules built for 2021.  Please call us on December 1st to schedule this appointment.   At the [facility], you and your health needs are our priority. As part of our continuing mission to provide you with exceptional heart care, we have created designated Provider Care Teams. These Care Teams include your primary Cardiologist (physician) and Advanced Practice Providers (APPs- Physician Assistants and Nurse Practitioners) who all work together to provide you with the care you need, when you need it.   You may see any of the following providers on your designated Care Team at your next follow up: Marland Kitchen Dr Glori Bickers . Dr [name] . Darrick Grinder, NP   Please be sure to bring in all your medications bottles to every appointment.

## 2019-06-29 NOTE — Telephone Encounter (Signed)
Pt says she is taking zetia and crestor. Advised to monitor fat in diet.  Verbalized understanding.

## 2019-06-29 NOTE — Telephone Encounter (Signed)
-----   Message from Larey Dresser, MD sent at 06/29/2019  3:49 PM EDT ----- Make sure she is taking Zetia and Crestor.

## 2019-07-01 NOTE — Progress Notes (Signed)
Patient ID: Christine Gay, female   DOB: 11/22/1940, 78 y.o.   MRN: [id number] PCP: Dr. Delfina Redwood Cardiology: Dr. Aundra Dubin  78 y.o. with history of CAD s/p CABG, DM, and HTN presents for followup of CHF and CAD.  She had a Lexiscan Cardiolite in 2014 that was an intermediate risk study and echo showed basal to mid inferior hypokinesis with EF 55-60%.  I did a cardaic catheterization in 8/14 that showed patent LIMA-LAD, occluded SVG-OM1, and 95% ostial LCx stenosis.  She had PCI to ostial LCx with Promus DES.  Cardiolite in 5/15 showed no ischemia or infarction.  She had an upper GI bleed in 9/15, she is now off Plavix.   In 1/18, she was admitted with severe fatigue and dyspnea and was found to be in complete heart block.  Troponin was up to 4.9.   She had LHC showing patent LIMA-LAD and SVG-OM1, patent LCx stent, and occluded SVG-OM2 (known from past), no evidence for plaque rupture/new disease. Echo showed EF stable at 50-55%.  She had St Jude PPM placed.   She was started on warfarin due to paroxysmal atrial fibrillation noted by device interrogation.   She was admitted with chest pain in 6/19, concerning for unstable angina.  LHC showed stable anatomy compared to 1/18 cath. Echo showed EF 50-55%.   She is stable symptomatically.  Weight is up about 6 lbs.  She has not had any chest pain.  She does not get short of breath walking on flat ground.  No orthopnea/PND.  She walks with a walker for stability/balance.  She had 1 fall back in 7/20, tripped and did not pass out.  She broke 2 ribs.  Main complaint now is right shoulder pain from rotator cuff tear .Marland Kitchen   ECG (personally reviewed): A-RV sequential pacing  Labs (3/13): K 4.2, creatinine 0.77 Labs (6/14): LDL 80, HDL 69 Labs (8/14): BNP 971 => 420 => 138, creatinine 0.8, K 4.3 Labs (11/14): K 4.1, creatinine 0.8, BNP 88 Labs (3/15): K 3.4, creatinine 0.86, LDL 85 Labs (9/15): K 3.2, creatinine 0.63, hgb 8 Labs (1/16): LDL 96 Labs (2/16): K 4.2,  creatinine 0.89 Labs (3/16): LDL 91, HDL 56 Labs (7/16): TSH normal, K 4.7, creatinine 0.9, hgb 11.4, LDL 93 Labs (8/17): LDL 83, HDL 62 Labs (1/18): K 4, creatinine 1.09, hgb 12.3 Labs (2/18): K 4.2, creatinine 0.81, BNP 163 Labs (3/18): K 3.9, creatinine 0.96 Labs (6/18): K 3.9, creatinine 0.86, hgb 11.2 Labs (6/19): K 3.7, creatinine 0.88, LDL 63, HDL 37 Labs (8/19): K 4.6, creatinine 0.85, hgb 11.5 Labs (7/20): K 5.2, creatinine 0.85, hgb 11.7  PMH: 1. GERD 2. Seizure disorder 3. Type II diabetes 4. Chronic LBBB 5. CAD: s/p CABG in 1992, had PCI several years ago.  Echo (2/11) with EF 55%, moderate LVH, aortic sclerosis.  LHC (8/14) with patent LIMA-LAD, totally occluded SVG-OM1, 95% ostial LCx, totally occluded small nondominant RCA with EF 55%.  Patient had Promus DES to pLCx. Lexiscan Cardiolite (5/15) with EF 57%, mild breast attenuation, no ischemia or infarction.  Lexiscan Cardiolite (2/16) with EF 44%, small partially reversible mid anterior perfusion defect may be due to soft tissue attenuation.   - LHC (1/18): patent LIMA-LAD, totally occluded LAD, patent SVG-OM1, totally occluded OM2 and SVG-OM2, patent stent in LCx, totally occluded nondominant RCA with right->right collaterals.  - LHC (6/19): Same as 1/18. 6. Glaucoma 7. Hypothyroidism 8. HTN 9. Cholecystectomy 10. Diastolic CHF: Echo (123XX123) with EF 55-60%, basal to mid  inferior hypokinesis, mild MR and mild AI. Echo (2/16) with EF 55-60%, mild LAE. - Echo (1/18): EF 50-55%, mild LVH, mildly decreased RV systolic function, mild AI.   - Echo (6/19): EF 50-55%, basal inferolateral HK, mild MR, mild AI.  11. Carotid stenosis: Carotid dopplers (6/15) with 40-59% bilateral ICA stenosis.  - Carotid dopplers (123XX123) with 123456 LICA stenosis.  - Carotid dopplers (5/18): Mild disease bilaterally.  - Carotid dopplers (5/19): Mild disease bilaterally 12. GI bleed 9/15: EGD showed a polypoid gastric lesion that was friable, benign  pathology.  13. Complete heart block: St Jude PPM in 1/18.  14. Atrial fibrillation: Paroxysmal.   SH: Widow, lives in [place], does not smoke.   FH: No premature CAD.  Diabetes.   ROS: All systems reviewed and negative except as per HPI.   Current Outpatient Medications  Medication Sig Dispense Refill  . acetaminophen (TYLENOL) 325 MG tablet Take 1-2 tablets (325-650 mg total) by mouth every 4 (four) hours as needed for mild pain.    . carvedilol (COREG) 6.25 MG tablet TAKE 1/2 TABLET BY MOUTH 2 TIMES A DAY WITH A MEAL. 90 tablet 4  . cycloSPORINE (RESTASIS) 0.05 % ophthalmic emulsion Place 1 drop into both eyes 2 (two) times daily.    . diclofenac (FLECTOR) 1.3 % PTCH Place 1 patch onto the skin 2 (two) times daily. 5 patch 1  . enoxaparin (LOVENOX) 120 MG/0.8ML injection Inject 0.8 mLs (120 mg total) into the skin daily. 10 Syringe 1  . ezetimibe (ZETIA) 10 MG tablet TAKE 1 TABLET BY MOUTH DAILY. 90 tablet 3  . ferrous sulfate 325 (65 FE) MG tablet Take 325 mg by mouth daily with breakfast.    . furosemide (LASIX) 40 MG tablet TAKE 1 AND 1/2 TABLETS BY MOUTH 2 TIMES DAILY. 180 tablet 2  . glucose blood test strip Test blood glucose three times daily. 100 each 0  . ibandronate (BONIVA) 150 MG tablet Take 150 mg by mouth every 30 (thirty) days. Take in the morning with a full glass of water, on an empty stomach, and do not take anything else by mouth or lie down for the next 30 min.    . isosorbide mononitrate (IMDUR) 60 MG 24 hr tablet Take 1 tablet (60 mg total) by mouth 2 (two) times daily. 180 tablet 1  . levothyroxine (SYNTHROID, LEVOTHROID) 100 MCG tablet Take 100 mcg by mouth daily before breakfast.    . metFORMIN (GLUCOPHAGE-XR) 500 MG 24 hr tablet Take 500 mg by mouth 2 (two) times daily.     . nitroGLYCERIN (NITROSTAT) 0.4 MG SL tablet PLACE 1 TABLET (0.4 MG TOTAL) UNDER THE TONGUE EVERY 5 (FIVE) MINUTES AS NEEDED FOR CHEST PAIN. 25 tablet 11  . pantoprazole (PROTONIX) 40 MG  tablet TAKE 1 TABLET BY MOUTH DAILY. 90 tablet 2  . phenytoin (DILANTIN) 100 MG ER capsule Take 100-200 mg by mouth 2 (two) times daily. 200 mg in the morning and 100 mg in the evening    . potassium chloride SA (K-DUR) 20 MEQ tablet TAKE 1 TABLET BY MOUTH 2 TIMES DAILY. 180 tablet 1  . rosuvastatin (CRESTOR) 40 MG tablet Take 1 tablet (40 mg total) by mouth daily. 90 tablet 3  . timolol (TIMOPTIC) 0.5 % ophthalmic solution Place 1 drop into both eyes daily.   6  . warfarin (COUMADIN) 5 MG tablet TAKE AS DIRECTED BY COUMADIN CLINIC 50 tablet 2   No current facility-administered medications for this encounter.  BP 126/62   Pulse 60   Wt 77.6 kg (171 lb)   SpO2 98%   BMI 33.40 kg/m  General: NAD Neck: No JVD, no thyromegaly or thyroid nodule.  Lungs: Clear to auscultation bilaterally with normal respiratory effort. CV: Nondisplaced PMI.  Heart regular S1/S2, no S3/S4, 1/6 SEM RUSB.  No edema.  No carotid bruit.  Normal pedal pulses.  Abdomen: Soft, nontender, no hepatosplenomegaly, no distention.  Skin: Intact without lesions or rashes.  Neurologic: Alert and oriented x 3.  Psych: Normal affect. Extremities: No clubbing or cyanosis.  HEENT: Normal.   Assessment/Plan: 1. CAD: Status post Promus DES to ostial LCx in 8/14.  Last cath in 6/19 showed stable anatomy.  No chest pain.  - Continue Imdur and Coreg at current doses.  - She is not a ranolazine candidate given use of Dilantin.  - Continue ASA 81 and statin/Zetia.  2. Diastolic CHF: EF 99991111 on 6/19 echo.  NYHA class II. Not volume overloaded on exam.  - Continue Lasix 60 mg bid.  BMET today.  3. Hyperlipidemia: She is on Zetia and Crestor. Check lipids today.  4. Carotid bruit: Mild disease on carotid dopplers in 5/19.  5. H/o GI bleeding: No melena or hematochezia.  6. Complete heart block: S/p St Jude PPM, device functioning appropriately.  7. Paroxysmal atrial fibrillation: She is not in afib today.  Continue warfarin.    Followup in 4 months.   [name] 07/01/2019

## 2019-07-06 ENCOUNTER — Other Ambulatory Visit: Payer: Self-pay

## 2019-07-06 ENCOUNTER — Ambulatory Visit (INDEPENDENT_AMBULATORY_CARE_PROVIDER_SITE_OTHER): Payer: Medicare Other | Admitting: *Deleted

## 2019-07-06 DIAGNOSIS — Z5181 Encounter for therapeutic drug level monitoring: Secondary | ICD-10-CM

## 2019-07-06 DIAGNOSIS — I4891 Unspecified atrial fibrillation: Secondary | ICD-10-CM

## 2019-07-06 LAB — POCT INR: INR: 2.3 (ref 2.0–3.0)

## 2019-07-06 NOTE — Patient Instructions (Signed)
Description   Continue taking 1.5 tablets daily. Recheck in 4 weeks.  [facility] [phone] Main [phone]    

## 2019-07-16 ENCOUNTER — Ambulatory Visit (INDEPENDENT_AMBULATORY_CARE_PROVIDER_SITE_OTHER): Payer: Medicare Other | Admitting: *Deleted

## 2019-07-16 DIAGNOSIS — I442 Atrioventricular block, complete: Secondary | ICD-10-CM

## 2019-07-16 DIAGNOSIS — I4891 Unspecified atrial fibrillation: Secondary | ICD-10-CM | POA: Diagnosis not present

## 2019-07-17 LAB — CUP PACEART REMOTE DEVICE CHECK
Battery Remaining Longevity: 126 mo
Battery Remaining Percentage: 95.5 %
Battery Voltage: 3.01 V
Brady Statistic AP VP Percent: 45 %
Brady Statistic AP VS Percent: 1 %
Brady Statistic AS VP Percent: 55 %
Brady Statistic AS VS Percent: 1 %
Brady Statistic RA Percent Paced: 44 %
Brady Statistic RV Percent Paced: 99 %
Date Time Interrogation Session: 20201019060021
Implantable Lead Implant Date: 20180103
Implantable Lead Implant Date: 20180103
Implantable Lead Location: 753859
Implantable Lead Location: 753860
Implantable Pulse Generator Implant Date: 20180103
Lead Channel Impedance Value: 360 Ohm
Lead Channel Impedance Value: 490 Ohm
Lead Channel Pacing Threshold Amplitude: 0.375 V
Lead Channel Pacing Threshold Amplitude: 0.5 V
Lead Channel Pacing Threshold Pulse Width: 0.5 ms
Lead Channel Pacing Threshold Pulse Width: 0.5 ms
Lead Channel Sensing Intrinsic Amplitude: 3.2 mV
Lead Channel Sensing Intrinsic Amplitude: 9.6 mV
Lead Channel Setting Pacing Amplitude: 0.75 V
Lead Channel Setting Pacing Amplitude: 1.375
Lead Channel Setting Pacing Pulse Width: 0.5 ms
Lead Channel Setting Sensing Sensitivity: 4 mV
Pulse Gen Model: 2272
Pulse Gen Serial Number: 7988263

## 2019-08-03 ENCOUNTER — Other Ambulatory Visit: Payer: Self-pay

## 2019-08-03 ENCOUNTER — Ambulatory Visit (INDEPENDENT_AMBULATORY_CARE_PROVIDER_SITE_OTHER): Payer: Medicare Other

## 2019-08-03 ENCOUNTER — Encounter (INDEPENDENT_AMBULATORY_CARE_PROVIDER_SITE_OTHER): Payer: Self-pay

## 2019-08-03 DIAGNOSIS — Z5181 Encounter for therapeutic drug level monitoring: Secondary | ICD-10-CM | POA: Diagnosis not present

## 2019-08-03 DIAGNOSIS — I4891 Unspecified atrial fibrillation: Secondary | ICD-10-CM

## 2019-08-03 LAB — POCT INR: INR: 3.3 — AB (ref 2.0–3.0)

## 2019-08-03 NOTE — Patient Instructions (Signed)
Description   Skip today's dosage of Coumadin, then start taking 1.5 tablets daily except 1 tablet on Fridays. Recheck in 4 weeks.  [facility] [phone] Main [phone]

## 2019-08-07 NOTE — Progress Notes (Signed)
Remote pacemaker transmission.   

## 2019-08-15 ENCOUNTER — Other Ambulatory Visit: Payer: Self-pay | Admitting: Cardiology

## 2019-08-15 ENCOUNTER — Other Ambulatory Visit: Payer: Self-pay | Admitting: Internal Medicine

## 2019-09-05 ENCOUNTER — Other Ambulatory Visit: Payer: Self-pay

## 2019-09-05 ENCOUNTER — Ambulatory Visit (INDEPENDENT_AMBULATORY_CARE_PROVIDER_SITE_OTHER): Payer: Medicare Other

## 2019-09-05 DIAGNOSIS — I4891 Unspecified atrial fibrillation: Secondary | ICD-10-CM

## 2019-09-05 DIAGNOSIS — Z5181 Encounter for therapeutic drug level monitoring: Secondary | ICD-10-CM

## 2019-09-05 LAB — POCT INR: INR: 4.3 — AB (ref 2.0–3.0)

## 2019-09-05 NOTE — Patient Instructions (Addendum)
Description   Skip today and tomorrow's dosage of Coumadin, then start taking 1.5 tablets daily except 1 tablet on Mondays, Wednesdays and Fridays. Recheck in 2 weeks.  Pt going out of town to [place],  has rx to get PT/INR done there unsure if they will accept.  Will schedule follow-up in the clinic in 4 weeks once pt returns home . [facility] [phone] Main [phone]

## 2019-09-26 ENCOUNTER — Ambulatory Visit: Payer: Medicare Other | Admitting: Podiatry

## 2019-10-04 ENCOUNTER — Other Ambulatory Visit: Payer: Self-pay

## 2019-10-04 ENCOUNTER — Ambulatory Visit (INDEPENDENT_AMBULATORY_CARE_PROVIDER_SITE_OTHER): Payer: Medicare Other | Admitting: *Deleted

## 2019-10-04 DIAGNOSIS — Z5181 Encounter for therapeutic drug level monitoring: Secondary | ICD-10-CM

## 2019-10-04 DIAGNOSIS — I4891 Unspecified atrial fibrillation: Secondary | ICD-10-CM

## 2019-10-04 LAB — POCT INR: INR: 1.9 — AB (ref 2.0–3.0)

## 2019-10-04 NOTE — Patient Instructions (Addendum)
Description   Take 2 tablets today, then continue taking 1.5 tablets daily except 1 tablet on Mondays, Wednesdays and Fridays. Recheck in 3 weeks. [facility] [phone] Main [phone]

## 2019-10-15 ENCOUNTER — Ambulatory Visit (INDEPENDENT_AMBULATORY_CARE_PROVIDER_SITE_OTHER): Payer: Medicare Other | Admitting: *Deleted

## 2019-10-15 DIAGNOSIS — I4891 Unspecified atrial fibrillation: Secondary | ICD-10-CM | POA: Diagnosis not present

## 2019-10-16 LAB — CUP PACEART REMOTE DEVICE CHECK
Battery Remaining Longevity: 125 mo
Battery Remaining Percentage: 95.5 %
Battery Voltage: 2.99 V
Brady Statistic AP VP Percent: 46 %
Brady Statistic AP VS Percent: 1 %
Brady Statistic AS VP Percent: 53 %
Brady Statistic AS VS Percent: 1 %
Brady Statistic RA Percent Paced: 45 %
Brady Statistic RV Percent Paced: 99 %
Date Time Interrogation Session: 20210119055517
Implantable Lead Implant Date: 20180103
Implantable Lead Implant Date: 20180103
Implantable Lead Location: 753859
Implantable Lead Location: 753860
Implantable Pulse Generator Implant Date: 20180103
Lead Channel Impedance Value: 390 Ohm
Lead Channel Impedance Value: 490 Ohm
Lead Channel Pacing Threshold Amplitude: 0.5 V
Lead Channel Pacing Threshold Amplitude: 0.5 V
Lead Channel Pacing Threshold Pulse Width: 0.5 ms
Lead Channel Pacing Threshold Pulse Width: 0.5 ms
Lead Channel Sensing Intrinsic Amplitude: 3.7 mV
Lead Channel Sensing Intrinsic Amplitude: 9.6 mV
Lead Channel Setting Pacing Amplitude: 0.75 V
Lead Channel Setting Pacing Amplitude: 1.5 V
Lead Channel Setting Pacing Pulse Width: 0.5 ms
Lead Channel Setting Sensing Sensitivity: 4 mV
Pulse Gen Model: 2272
Pulse Gen Serial Number: 7988263

## 2019-10-25 ENCOUNTER — Other Ambulatory Visit: Payer: Self-pay

## 2019-10-25 ENCOUNTER — Ambulatory Visit (INDEPENDENT_AMBULATORY_CARE_PROVIDER_SITE_OTHER): Payer: Medicare Other

## 2019-10-25 DIAGNOSIS — Z5181 Encounter for therapeutic drug level monitoring: Secondary | ICD-10-CM | POA: Diagnosis not present

## 2019-10-25 DIAGNOSIS — I4891 Unspecified atrial fibrillation: Secondary | ICD-10-CM

## 2019-10-25 LAB — POCT INR: INR: 2.4 (ref 2.0–3.0)

## 2019-10-25 NOTE — Patient Instructions (Signed)
Description   Continue on same dosage 1.5 tablets daily except 1 tablet on Mondays, Wednesdays and Fridays. Recheck in 4 weeks. [facility] [phone] Main [phone]

## 2019-11-12 ENCOUNTER — Other Ambulatory Visit (HOSPITAL_COMMUNITY): Payer: Self-pay | Admitting: Cardiology

## 2019-11-12 ENCOUNTER — Ambulatory Visit (INDEPENDENT_AMBULATORY_CARE_PROVIDER_SITE_OTHER): Payer: Medicare Other | Admitting: Podiatry

## 2019-11-12 ENCOUNTER — Encounter: Payer: Self-pay | Admitting: Podiatry

## 2019-11-12 ENCOUNTER — Other Ambulatory Visit: Payer: Self-pay

## 2019-11-12 DIAGNOSIS — D689 Coagulation defect, unspecified: Secondary | ICD-10-CM

## 2019-11-12 DIAGNOSIS — M79674 Pain in right toe(s): Secondary | ICD-10-CM

## 2019-11-12 DIAGNOSIS — M2041 Other hammer toe(s) (acquired), right foot: Secondary | ICD-10-CM

## 2019-11-12 DIAGNOSIS — M79675 Pain in left toe(s): Secondary | ICD-10-CM | POA: Diagnosis not present

## 2019-11-12 DIAGNOSIS — B351 Tinea unguium: Secondary | ICD-10-CM | POA: Diagnosis not present

## 2019-11-12 DIAGNOSIS — M2042 Other hammer toe(s) (acquired), left foot: Secondary | ICD-10-CM

## 2019-11-12 NOTE — Progress Notes (Signed)
Patient ID: Christine Gay, female   DOB: 02-Apr-1941, 79 y.o.   MRN: [id number] Complaint:  Visit Type: Patient returns to my office for continued preventative foot care services. Complaint: Patient states" my nails have grown long and thick and become painful to walk and wear shoes" Patient has been diagnosed with DM with no foot complications. The patient presents for preventative foot care services. No changes to ROS  Podiatric Exam: Vascular: dorsalis pedis and posterior tibial pulses are palpable bilateral. Capillary return is immediate. Temperature gradient is WNL. Skin turgor WNL  Sensorium: Diminished  Semmes Weinstein monofilament test. Normal tactile sensation bilaterally. Nail Exam: Pt has thick disfigured discolored nails with subungual debris noted bilateral entire nail hallux through fifth toenails Ulcer Exam: There is no evidence of ulcer or pre-ulcerative changes or infection. Orthopedic Exam: Muscle tone and strength are WNL. No limitations in general ROM. No crepitus or effusions noted. Foot type and digits show no abnormalities. Bony prominences are unremarkable. HAV 1st  MPJ B/L.  Hammer toes 2 B/l Skin: No Porokeratosis. No infection or ulcers.  Asymptomatic Callus sub 5th B/L  Diagnosis:  Onychomycosis, , Pain in right toe, pain in left toes,   Treatment & Plan Procedures and Treatment: Consent by patient was obtained for treatment procedures. The patient understood the discussion of treatment and procedures well. All questions were answered thoroughly reviewed. Debridement of mycotic and hypertrophic toenails, 1 through 5 bilateral and clearing of subungual debris. No ulceration, no infection noted. Patient qualifies for diabetic shoes. Return Visit-Office Procedure: Patient instructed to return to the office for a follow up visit 3 months for continued evaluation and treatment.  Gardiner Barefoot DPM

## 2019-11-14 ENCOUNTER — Other Ambulatory Visit (HOSPITAL_COMMUNITY): Payer: Self-pay | Admitting: *Deleted

## 2019-11-14 MED ORDER — ISOSORBIDE MONONITRATE ER 60 MG PO TB24: 60.0000 mg | ORAL_TABLET | Freq: Two times a day (BID) | ORAL | 1 refills | Status: AC

## 2019-11-22 ENCOUNTER — Other Ambulatory Visit: Payer: Self-pay

## 2019-11-22 ENCOUNTER — Ambulatory Visit (INDEPENDENT_AMBULATORY_CARE_PROVIDER_SITE_OTHER): Payer: Medicare Other | Admitting: *Deleted

## 2019-11-22 DIAGNOSIS — Z5181 Encounter for therapeutic drug level monitoring: Secondary | ICD-10-CM | POA: Diagnosis not present

## 2019-11-22 DIAGNOSIS — I4891 Unspecified atrial fibrillation: Secondary | ICD-10-CM | POA: Diagnosis not present

## 2019-11-22 LAB — POCT INR: INR: 2.3 (ref 2.0–3.0)

## 2019-11-22 NOTE — Patient Instructions (Addendum)
Description   Continue on same dosage 1.5 tablets daily except 1 tablet on Mondays, Wednesdays and Fridays. Recheck in 5 weeks. [facility] [phone] Main [phone]

## 2019-12-18 ENCOUNTER — Other Ambulatory Visit: Payer: Self-pay | Admitting: Cardiology

## 2019-12-18 ENCOUNTER — Other Ambulatory Visit (HOSPITAL_COMMUNITY): Payer: Self-pay | Admitting: Cardiology

## 2019-12-27 ENCOUNTER — Other Ambulatory Visit: Payer: Self-pay

## 2019-12-27 ENCOUNTER — Ambulatory Visit (INDEPENDENT_AMBULATORY_CARE_PROVIDER_SITE_OTHER): Payer: Medicare Other | Admitting: *Deleted

## 2019-12-27 DIAGNOSIS — I4891 Unspecified atrial fibrillation: Secondary | ICD-10-CM | POA: Diagnosis not present

## 2019-12-27 DIAGNOSIS — Z5181 Encounter for therapeutic drug level monitoring: Secondary | ICD-10-CM

## 2019-12-27 LAB — POCT INR: INR: 2.9 (ref 2.0–3.0)

## 2019-12-27 NOTE — Patient Instructions (Signed)
Description   Continue on same dosage 1.5 tablets daily except 1 tablet on Mondays, Wednesdays and Fridays. Recheck in 6 weeks. [facility] [phone] Main [phone]

## 2020-01-14 ENCOUNTER — Ambulatory Visit (INDEPENDENT_AMBULATORY_CARE_PROVIDER_SITE_OTHER): Payer: Medicare Other | Admitting: *Deleted

## 2020-01-14 DIAGNOSIS — I4891 Unspecified atrial fibrillation: Secondary | ICD-10-CM

## 2020-01-14 LAB — CUP PACEART REMOTE DEVICE CHECK
Battery Remaining Longevity: 127 mo
Battery Remaining Percentage: 95.5 %
Battery Voltage: 2.99 V
Brady Statistic AP VP Percent: 47 %
Brady Statistic AP VS Percent: 1 %
Brady Statistic AS VP Percent: 52 %
Brady Statistic AS VS Percent: 1 %
Brady Statistic RA Percent Paced: 46 %
Brady Statistic RV Percent Paced: 99 %
Date Time Interrogation Session: 20210419033722
Implantable Lead Implant Date: 20180103
Implantable Lead Implant Date: 20180103
Implantable Lead Location: 753859
Implantable Lead Location: 753860
Implantable Pulse Generator Implant Date: 20180103
Lead Channel Impedance Value: 380 Ohm
Lead Channel Impedance Value: 480 Ohm
Lead Channel Pacing Threshold Amplitude: 0.5 V
Lead Channel Pacing Threshold Amplitude: 0.5 V
Lead Channel Pacing Threshold Pulse Width: 0.5 ms
Lead Channel Pacing Threshold Pulse Width: 0.5 ms
Lead Channel Sensing Intrinsic Amplitude: 3.1 mV
Lead Channel Sensing Intrinsic Amplitude: 9.6 mV
Lead Channel Setting Pacing Amplitude: 0.75 V
Lead Channel Setting Pacing Amplitude: 1.5 V
Lead Channel Setting Pacing Pulse Width: 0.5 ms
Lead Channel Setting Sensing Sensitivity: 4 mV
Pulse Gen Model: 2272
Pulse Gen Serial Number: 7988263

## 2020-01-15 NOTE — Progress Notes (Signed)
PPM Remote  

## 2020-01-21 ENCOUNTER — Other Ambulatory Visit: Payer: Self-pay | Admitting: Cardiology

## 2020-02-07 ENCOUNTER — Ambulatory Visit (INDEPENDENT_AMBULATORY_CARE_PROVIDER_SITE_OTHER): Payer: Medicare Other | Admitting: *Deleted

## 2020-02-07 ENCOUNTER — Other Ambulatory Visit: Payer: Self-pay

## 2020-02-07 ENCOUNTER — Encounter (INDEPENDENT_AMBULATORY_CARE_PROVIDER_SITE_OTHER): Payer: Self-pay

## 2020-02-07 DIAGNOSIS — I4891 Unspecified atrial fibrillation: Secondary | ICD-10-CM | POA: Diagnosis not present

## 2020-02-07 DIAGNOSIS — Z5181 Encounter for therapeutic drug level monitoring: Secondary | ICD-10-CM

## 2020-02-07 LAB — POCT INR: INR: 2 (ref 2.0–3.0)

## 2020-02-07 NOTE — Patient Instructions (Signed)
Description   Today take 2 tablets then continue on same dosage 1.5 tablets daily except 1 tablet on Mondays, Wednesdays and Fridays. Recheck in 6 weeks. [facility] [phone] Main [phone]

## 2020-02-13 ENCOUNTER — Encounter: Payer: Self-pay | Admitting: Podiatry

## 2020-02-13 ENCOUNTER — Other Ambulatory Visit: Payer: Self-pay

## 2020-02-13 ENCOUNTER — Ambulatory Visit (INDEPENDENT_AMBULATORY_CARE_PROVIDER_SITE_OTHER): Payer: Medicare Other | Admitting: Podiatry

## 2020-02-13 DIAGNOSIS — M2041 Other hammer toe(s) (acquired), right foot: Secondary | ICD-10-CM

## 2020-02-13 DIAGNOSIS — E1140 Type 2 diabetes mellitus with diabetic neuropathy, unspecified: Secondary | ICD-10-CM

## 2020-02-13 DIAGNOSIS — D689 Coagulation defect, unspecified: Secondary | ICD-10-CM | POA: Diagnosis not present

## 2020-02-13 DIAGNOSIS — M2042 Other hammer toe(s) (acquired), left foot: Secondary | ICD-10-CM

## 2020-02-13 DIAGNOSIS — M79675 Pain in left toe(s): Secondary | ICD-10-CM | POA: Diagnosis not present

## 2020-02-13 DIAGNOSIS — M79674 Pain in right toe(s): Secondary | ICD-10-CM

## 2020-02-13 DIAGNOSIS — B351 Tinea unguium: Secondary | ICD-10-CM | POA: Diagnosis not present

## 2020-02-13 NOTE — Progress Notes (Signed)
This patient returns to my office for at risk foot care.  This patient requires this care by a professional since this patient will be at risk due to having coagulation defect, diabetes.  Patient is taking coumadin.  This patient is unable to cut nails herself since the patient cannot reach hiernails.These nails are painful walking and wearing shoes.  This patient presents for at risk foot care today.  General Appearance  Alert, conversant and in no acute stress.  Vascular  Dorsalis pedis and posterior tibial  pulses are palpable  bilaterally.  Capillary return is within normal limits  bilaterally. Temperature is within normal limits  bilaterally.  Neurologic  Senn-Weinstein monofilament wire test diminished   bilaterally. Muscle power within normal limits bilaterally.  Nails Thick disfigured discolored nails with subungual debris  from hallux to fifth toes bilaterally. No evidence of bacterial infection or drainage bilaterally.  Orthopedic  No limitations of motion  feet .  No crepitus or effusions noted.  HAV 1st MPJ .  Hammer toes 2  B/L.  Skin  normotropic skin with no porokeratosis noted bilaterally.  No signs of infections or ulcers noted.   Asymptomatic callus sub 5th met  B/L  Onychomycosis  Pain in right toes  Pain in left toes  Consent was obtained for treatment procedures.   Mechanical debridement of nails 1-5  bilaterally performed with a nail nipper.  Filed with dremel without incident.    Return office visit     12 weeks                 Told patient to return for periodic foot care and evaluation due to potential at risk complications.   Logyn Dedominicis DPM  

## 2020-03-20 ENCOUNTER — Other Ambulatory Visit: Payer: Self-pay

## 2020-03-20 ENCOUNTER — Ambulatory Visit (INDEPENDENT_AMBULATORY_CARE_PROVIDER_SITE_OTHER): Payer: Medicare Other

## 2020-03-20 DIAGNOSIS — Z5181 Encounter for therapeutic drug level monitoring: Secondary | ICD-10-CM

## 2020-03-20 DIAGNOSIS — I4891 Unspecified atrial fibrillation: Secondary | ICD-10-CM

## 2020-03-20 LAB — POCT INR: INR: 2.1 (ref 2.0–3.0)

## 2020-03-20 NOTE — Patient Instructions (Signed)
Continue on same dosage 1.5 tablets daily except 1 tablet on Mondays, Wednesdays and Fridays. Recheck in 6 weeks. [facility] [phone] Main [phone]

## 2020-04-09 ENCOUNTER — Other Ambulatory Visit: Payer: Self-pay | Admitting: Cardiology

## 2020-04-09 ENCOUNTER — Other Ambulatory Visit: Payer: Self-pay | Admitting: Internal Medicine

## 2020-04-14 ENCOUNTER — Ambulatory Visit (INDEPENDENT_AMBULATORY_CARE_PROVIDER_SITE_OTHER): Payer: Medicare Other | Admitting: *Deleted

## 2020-04-14 DIAGNOSIS — I442 Atrioventricular block, complete: Secondary | ICD-10-CM | POA: Diagnosis not present

## 2020-04-15 LAB — CUP PACEART REMOTE DEVICE CHECK
Battery Remaining Longevity: 124 mo
Battery Remaining Percentage: 95.5 %
Battery Voltage: 2.99 V
Brady Statistic AP VP Percent: 48 %
Brady Statistic AP VS Percent: 1 %
Brady Statistic AS VP Percent: 51 %
Brady Statistic AS VS Percent: 1 %
Brady Statistic RA Percent Paced: 47 %
Brady Statistic RV Percent Paced: 99 %
Date Time Interrogation Session: 20210719020012
Implantable Lead Implant Date: 20180103
Implantable Lead Implant Date: 20180103
Implantable Lead Location: 753859
Implantable Lead Location: 753860
Implantable Pulse Generator Implant Date: 20180103
Lead Channel Impedance Value: 360 Ohm
Lead Channel Impedance Value: 480 Ohm
Lead Channel Pacing Threshold Amplitude: 0.5 V
Lead Channel Pacing Threshold Amplitude: 0.5 V
Lead Channel Pacing Threshold Pulse Width: 0.5 ms
Lead Channel Pacing Threshold Pulse Width: 0.5 ms
Lead Channel Sensing Intrinsic Amplitude: 12 mV
Lead Channel Sensing Intrinsic Amplitude: 3.6 mV
Lead Channel Setting Pacing Amplitude: 0.75 V
Lead Channel Setting Pacing Amplitude: 1.5 V
Lead Channel Setting Pacing Pulse Width: 0.5 ms
Lead Channel Setting Sensing Sensitivity: 4 mV
Pulse Gen Model: 2272
Pulse Gen Serial Number: 7988263

## 2020-04-15 NOTE — Progress Notes (Signed)
Remote pacemaker transmission.   

## 2020-04-24 ENCOUNTER — Other Ambulatory Visit: Payer: Self-pay

## 2020-04-24 ENCOUNTER — Emergency Department (HOSPITAL_BASED_OUTPATIENT_CLINIC_OR_DEPARTMENT_OTHER)
Admission: EM | Admit: 2020-04-24 | Discharge: 2020-04-24 | Disposition: A | Discharge status: Home / Self Care | Payer: Medicare Other | Attending: Emergency Medicine | Admitting: Emergency Medicine

## 2020-04-24 ENCOUNTER — Encounter (HOSPITAL_BASED_OUTPATIENT_CLINIC_OR_DEPARTMENT_OTHER): Payer: Self-pay | Admitting: Emergency Medicine

## 2020-04-24 ENCOUNTER — Emergency Department (HOSPITAL_BASED_OUTPATIENT_CLINIC_OR_DEPARTMENT_OTHER): Payer: Medicare Other

## 2020-04-24 DIAGNOSIS — E119 Type 2 diabetes mellitus without complications: Secondary | ICD-10-CM | POA: Diagnosis not present

## 2020-04-24 DIAGNOSIS — I5032 Chronic diastolic (congestive) heart failure: Secondary | ICD-10-CM | POA: Diagnosis not present

## 2020-04-24 DIAGNOSIS — Y9289 Other specified places as the place of occurrence of the external cause: Secondary | ICD-10-CM | POA: Insufficient documentation

## 2020-04-24 DIAGNOSIS — S93402A Sprain of unspecified ligament of left ankle, initial encounter: Secondary | ICD-10-CM | POA: Diagnosis not present

## 2020-04-24 DIAGNOSIS — I2510 Atherosclerotic heart disease of native coronary artery without angina pectoris: Secondary | ICD-10-CM | POA: Insufficient documentation

## 2020-04-24 DIAGNOSIS — I110 Hypertensive heart disease with heart failure: Secondary | ICD-10-CM | POA: Insufficient documentation

## 2020-04-24 DIAGNOSIS — E039 Hypothyroidism, unspecified: Secondary | ICD-10-CM | POA: Insufficient documentation

## 2020-04-24 DIAGNOSIS — S99912A Unspecified injury of left ankle, initial encounter: Secondary | ICD-10-CM | POA: Diagnosis present

## 2020-04-24 DIAGNOSIS — Y999 Unspecified external cause status: Secondary | ICD-10-CM | POA: Diagnosis not present

## 2020-04-24 DIAGNOSIS — Z7984 Long term (current) use of oral hypoglycemic drugs: Secondary | ICD-10-CM | POA: Diagnosis not present

## 2020-04-24 DIAGNOSIS — Y939 Activity, unspecified: Secondary | ICD-10-CM | POA: Diagnosis not present

## 2020-04-24 DIAGNOSIS — W19XXXA Unspecified fall, initial encounter: Secondary | ICD-10-CM | POA: Insufficient documentation

## 2020-04-24 NOTE — ED Triage Notes (Signed)
Pt arrives from home with c/o left foot pain after a fall. Pt states she was attempting to get out of a recliner and fells like she stepped on a ball or something, resulting in a fall. Left ankle pain increased with ambulation.

## 2020-04-24 NOTE — ED Provider Notes (Signed)
[facility] EMERGENCY DEPARTMENT Provider Note   CSN: [id number] Arrival date & time: 04/24/20  1932     History Chief Complaint  Patient presents with  . Fall    Christine Gay is a 79 y.o. female.  The history is provided by the patient.  Fall This is a new problem. The problem occurs constantly. The problem has not changed since onset.Pertinent negatives include no headaches. Associated symptoms comments: Left ankle pain, twisted it . The symptoms are aggravated by walking. Nothing relieves the symptoms. She has tried nothing for the symptoms. The treatment provided no relief.       Past Medical History:  Diagnosis Date  . Anemia    a. Noted on labs 04/2013.  . Anginal pain ([place])   . Arthritis    "legs" (06/10/2014)  . CAD (coronary artery disease)    a. s/p CABG 1992, b. s/p PCI several years ago; c. LHC 11/08: L-LAD ok, S-OM1 ok, S-OM2 CTO, prox to mid CFX 70% (unchanged), RCA non-dominant, occluded;  d.  Echo 2/11: EF 55%, mod LVH, Ao sclerosis;  e. Echo 6/14: mild LVH, mild focal basal sept hypertrophy, EF 55-65%, inf HK, Mild AI, mild MR, mild BAE;  f. Myoview 7/14: int risk-> cath 04/2013 s/p PTCA/DES to prox Cx 05/04/13.  . Chronic diastolic CHF (congestive heart failure) ([place])   . GERD (gastroesophageal reflux disease)   . Glaucoma   . High cholesterol   . Hypertension   . Hypokalemia 02/2017  . Hypothyroidism   . LBBB (left bundle branch block)    chronic  . Myocardial infarction ([place]) 03/1991 X 2; 07/1991  . Obesity   . Seizures ([place])    "I'm on Dilantin" (06/10/2014)  . Sickle cell trait ([place])   . Type II diabetes mellitus [facility])     Patient Active Problem List   Diagnosis Date Noted  . Pain due to onychomycosis of toenails of both feet 03/28/2019  . Coagulation disorder ([place]) 03/28/2019  . Chronic anticoagulation 03/23/2018  . Pacemaker 03/23/2018  . Abnormal nuclear cardiac imaging test   . NSTEMI (non-ST elevated myocardial infarction)  ([place]) 03/18/2018  . Shoulder pain, left 03/09/2017  . Seizure ([place])   . Chest pain 03/08/2017  . Muscle spasm 03/08/2017  . Hypocalcemia 03/08/2017  . Encounter for therapeutic drug monitoring 02/08/2017  . Atrial fibrillation ([place]) 02/08/2017  . Complete heart block ([place]) 09/28/2016  . Third degree heart block ([place])   . Unstable angina ([place])   . CAD (coronary artery disease) 11/11/2014  . Dizziness 07/29/2014  . GI bleed 06/12/2014  . GERD (gastroesophageal reflux disease) 06/11/2014  . Type II or unspecified type diabetes mellitus without mention of complication, not stated as uncontrolled 06/11/2014  . Glaucoma 06/11/2014  . Microcytic hypochromic anemia 06/10/2014  . Chronic diastolic CHF (congestive heart failure) ([place]) 08/09/2013  . Hypotension 05/18/2013  . Exertional dyspnea 03/15/2013  . CHF (congestive heart failure) ([place]) 03/15/2013  . MURMUR 03/02/2010  . Hypothyroidism 03/20/2009  . HYPERCHOLESTEROLEMIA  IIA 03/20/2009  . HYPOKALEMIA 03/20/2009  . HYPERTENSION, BENIGN 03/20/2009  . CAD, ARTERY BYPASS GRAFT 03/20/2009    Past Surgical History:  Procedure Laterality Date  . CARDIAC CATHETERIZATION     "several;  they went up to check"  . CARDIAC CATHETERIZATION N/A 09/28/2016   Procedure: Left Heart Cath and Coronary Angiography;  Surgeon: Peter M Martinique, MD;  Location: [facility] CV LAB;  Service: Cardiovascular;  Laterality: N/A;  . CATARACT EXTRACTION W/  INTRAOCULAR LENS  IMPLANT, BILATERAL Bilateral   . CHOLECYSTECTOMY    . COLONOSCOPY N/A 06/12/2014   Procedure: COLONOSCOPY;  Surgeon: Cleotis Nipper, MD;  Location: [facility] ENDOSCOPY;  Service: Endoscopy;  Laterality: N/A;  . CORONARY ANGIOPLASTY WITH STENT PLACEMENT  ? date; 05/04/2013   ? location; DES  to [place]  . CORONARY ARTERY BYPASS GRAFT  07/1991   "CABG X 3"  . EP IMPLANTABLE DEVICE N/A 09/29/2016   Procedure: Pacemaker Implant;  Surgeon: Will Meredith Leeds, MD;  Location: [facility] CV LAB;   Service: Cardiovascular;  Laterality: N/A;  . ESOPHAGOGASTRODUODENOSCOPY N/A 06/12/2014   Procedure: ESOPHAGOGASTRODUODENOSCOPY (EGD);  Surgeon: Cleotis Nipper, MD;  Location: [facility] ENDOSCOPY;  Service: Endoscopy;  Laterality: N/A;  . LEFT AND RIGHT HEART CATHETERIZATION WITH CORONARY ANGIOGRAM N/A 05/04/2013   Procedure: LEFT AND RIGHT HEART CATHETERIZATION WITH CORONARY ANGIOGRAM;  Surgeon: Larey Dresser, MD;  Location: [facility] CATH LAB;  Service: Cardiovascular;  Laterality: N/A;  . LEFT HEART CATH AND CORS/GRAFTS ANGIOGRAPHY N/A 03/22/2018   Procedure: LEFT HEART CATH AND CORS/GRAFTS ANGIOGRAPHY;  Surgeon: Sherren Mocha, MD;  Location: [place] [facility] [place] CV LAB;  Service: Cardiovascular;  Laterality: N/A;  . PERCUTANEOUS STENT INTERVENTION  05/04/2013   Procedure: PERCUTANEOUS STENT INTERVENTION;  Surgeon: Larey Dresser, MD;  Location: [facility] CATH LAB;  Service: Cardiovascular;;  . TUBAL LIGATION Bilateral 1969     OB History   No obstetric history on file.     Family History  Problem Relation Age of Onset  . Diabetes Mother     Social History   Tobacco Use  . Smoking status: Never Smoker  . Smokeless tobacco: Never Used  Vaping Use  . Vaping Use: Never used  Substance Use Topics  . Alcohol use: No  . Drug use: No    Home Medications Prior to Admission medications   Medication Sig Start Date End Date Taking? Authorizing Provider  Accu-Chek Softclix Lancets lancets  07/16/19   [provider]  acetaminophen (TYLENOL) 325 MG tablet Take 1-2 tablets (325-650 mg total) by mouth every 4 (four) hours as needed for mild pain. 09/30/16   Shirley Friar, PA-C  Blood Glucose Monitoring Suppl (ACCU-CHEK AVIVA PLUS) w/Device KIT  07/16/19   [provider]  carvedilol (COREG) 6.25 MG tablet TAKE 1/2 TABLET BY MOUTH 2 TIMES A DAY WITH A MEAL. 03/19/19   Larey Dresser, MD  cycloSPORINE (RESTASIS) 0.05 % ophthalmic emulsion Place 1 drop into both eyes 2 (two) times daily.     [provider]  diclofenac (FLECTOR) 1.3 % PTCH Place 1 patch onto the skin 2 (two) times daily. 04/04/19   Carmin Muskrat, MD  enoxaparin (LOVENOX) 120 MG/0.8ML injection Inject 0.8 mLs (120 mg total) into the skin daily. 12/07/18   Larey Dresser, MD  ezetimibe (ZETIA) 10 MG tablet TAKE 1 TABLET BY MOUTH DAILY. 01/21/20   Larey Dresser, MD  ferrous sulfate 325 (65 FE) MG tablet Take 325 mg by mouth daily with breakfast.    [provider]  furosemide (LASIX) 40 MG tablet TAKE 1 AND 1/2 TABLETS BY MOUTH 2 TIMES DAILY. 04/10/20   Bensimhon, Shaune Pascal, MD  glucose blood test strip Test blood glucose three times daily. 06/30/17   Larey Dresser, MD  ibandronate (BONIVA) 150 MG tablet Take 150 mg by mouth every 30 (thirty) days. Take in the morning with a full glass of water, on an empty stomach, and do not take anything else by  mouth or lie down for the next 30 min.    [provider]  isosorbide mononitrate (IMDUR) 60 MG 24 hr tablet Take 1 tablet (60 mg total) by mouth 2 (two) times daily. 11/14/19   Larey Dresser, MD  levothyroxine (SYNTHROID, LEVOTHROID) 100 MCG tablet Take 100 mcg by mouth daily before breakfast.    [provider]  metFORMIN (GLUCOPHAGE-XR) 500 MG 24 hr tablet Take 500 mg by mouth 2 (two) times daily.  05/08/13   Dunn, Nedra Hai, PA-C  nitroGLYCERIN (NITROSTAT) 0.4 MG SL tablet PLACE 1 TABLET (0.4 MG TOTAL) UNDER THE TONGUE EVERY 5 (FIVE) MINUTES AS NEEDED FOR CHEST PAIN. 05/12/18   Larey Dresser, MD  pantoprazole (PROTONIX) 40 MG tablet TAKE 1 TABLET BY MOUTH DAILY. 05/09/19   Larey Dresser, MD  phenytoin (DILANTIN) 100 MG ER capsule Take 100-200 mg by mouth 2 (two) times daily. 200 mg in the morning and 100 mg in the evening    [provider]  potassium chloride SA (KLOR-CON) 20 MEQ tablet TAKE 1 TABLET BY MOUTH 2 TIMES DAILY. 12/18/19   Larey Dresser, MD  rosuvastatin (CRESTOR) 40 MG tablet Take 1 tablet (40 mg total) by  mouth daily. 05/03/19   Larey Dresser, MD  timolol (TIMOPTIC) 0.5 % ophthalmic solution Place 1 drop into both eyes daily.  09/21/16   [provider]  warfarin (COUMADIN) 5 MG tablet Take 1-1.5 tablets daily or as directed by Anticoagulation Clinic. 04/10/20   Larey Dresser, MD    Allergies    Meperidine hcl, Nitroglycerin, and Morphine  Review of Systems   Review of Systems  Constitutional: Negative for fever.  Musculoskeletal: Positive for arthralgias, gait problem and joint swelling.  Skin: Negative for color change, pallor, rash and wound.  Neurological: Negative for dizziness, tremors, seizures, syncope, facial asymmetry, speech difficulty, weakness, light-headedness, numbness and headaches.    Physical Exam Updated Vital Signs  ED Triage Vitals  Enc Vitals Group     BP 04/24/20 1938 (!) 144/71     Pulse Rate 04/24/20 1938 67     Resp 04/24/20 1938 16     Temp 04/24/20 1938 98.2 F (36.8 C)     Temp Source 04/24/20 1938 Oral     SpO2 04/24/20 1938 99 %     Weight 04/24/20 1936 176 lb (79.8 kg)     Height 04/24/20 1936 5' (1.524 m)     Head Circumference --      Peak Flow --      Pain Score 04/24/20 1936 7     Pain Loc --      Pain Edu? --      Excl. in [place]? --     Physical Exam Constitutional:      General: She is not in acute distress.    Appearance: She is not ill-appearing.  HENT:     Head: Normocephalic and atraumatic.  Cardiovascular:     Pulses: Normal pulses.  Musculoskeletal:        General: Swelling and tenderness present. Normal range of motion.     Cervical back: Normal range of motion. No tenderness.     Comments: TTP to left ankle with some swelling   Skin:    General: Skin is warm.     Capillary Refill: Capillary refill takes less than 2 seconds.     Findings: No erythema.  Neurological:     General: No focal deficit present.  Mental Status: She is alert.     Sensory: No sensory deficit.     Motor: No weakness.     ED  Results / Procedures / Treatments   Labs (all labs ordered are listed, but only abnormal results are displayed) Labs Reviewed - No data to display  EKG None  Radiology DG Ankle Complete Left  Result Date: 04/24/2020 CLINICAL DATA:  79 year old female with fall and trauma to the left ankle. EXAM: LEFT ANKLE COMPLETE - 3+ VIEW COMPARISON:  None. FINDINGS: There is no acute fracture or dislocation. The bones are osteopenic. The ankle mortise is intact. There is diffuse subcutaneous edema. IMPRESSION: No acute fracture or dislocation. Electronically Signed   By: Anner Crete M.D.   On: 04/24/2020 20:16    Procedures Procedures (including critical care time)  Medications Ordered in ED Medications - No data to display  ED Course  I have reviewed the triage vital signs and the nursing notes.  Pertinent labs & imaging results that were available during my care of the patient were reviewed by me and considered in my medical decision making (see chart for details).    MDM Rules/Calculators/A&P                          Christine Gay is a 79 year old female who presents to the ED with left ankle pain after twisting it.  X-ray shows no acute fracture.  Likely ankle sprain.  Neurovascularly neuromuscularly intact.  Patient does use a walker at home at times will have her use the walker.  Ankle splint was placed.  Recommend RICE with rest, ice, compression, elevation as well as Tylenol.  Understands return precautions and discharged from ED in good condition.  This chart was dictated using voice recognition software.  Despite best efforts to proofread,  errors can occur which can change the documentation meaning.    Final Clinical Impression(s) / ED Diagnoses Final diagnoses:  Sprain of left ankle, unspecified ligament, initial encounter    Rx / DC Orders ED Discharge Orders    None       Lennice Sites, DO 04/24/20 2052

## 2020-04-27 ENCOUNTER — Emergency Department (HOSPITAL_COMMUNITY): Payer: Medicare Other

## 2020-04-27 ENCOUNTER — Other Ambulatory Visit: Payer: Self-pay

## 2020-04-27 ENCOUNTER — Encounter (HOSPITAL_COMMUNITY): Payer: Self-pay | Admitting: Emergency Medicine

## 2020-04-27 ENCOUNTER — Inpatient Hospital Stay (HOSPITAL_COMMUNITY)
Admission: EM | Admit: 2020-04-27 | Discharge: 2020-04-30 | DRG: 247 | Disposition: A | Discharge status: Home Health | Payer: Medicare Other | Attending: Family Medicine | Admitting: Family Medicine

## 2020-04-27 DIAGNOSIS — Z955 Presence of coronary angioplasty implant and graft: Secondary | ICD-10-CM

## 2020-04-27 DIAGNOSIS — E039 Hypothyroidism, unspecified: Secondary | ICD-10-CM | POA: Diagnosis present

## 2020-04-27 DIAGNOSIS — R1310 Dysphagia, unspecified: Secondary | ICD-10-CM | POA: Diagnosis present

## 2020-04-27 DIAGNOSIS — I25810 Atherosclerosis of coronary artery bypass graft(s) without angina pectoris: Secondary | ICD-10-CM | POA: Diagnosis present

## 2020-04-27 DIAGNOSIS — I5032 Chronic diastolic (congestive) heart failure: Secondary | ICD-10-CM | POA: Diagnosis present

## 2020-04-27 DIAGNOSIS — E1169 Type 2 diabetes mellitus with other specified complication: Secondary | ICD-10-CM | POA: Diagnosis present

## 2020-04-27 DIAGNOSIS — Z7901 Long term (current) use of anticoagulants: Secondary | ICD-10-CM

## 2020-04-27 DIAGNOSIS — E119 Type 2 diabetes mellitus without complications: Secondary | ICD-10-CM

## 2020-04-27 DIAGNOSIS — Z7902 Long term (current) use of antithrombotics/antiplatelets: Secondary | ICD-10-CM

## 2020-04-27 DIAGNOSIS — I214 Non-ST elevation (NSTEMI) myocardial infarction: Principal | ICD-10-CM | POA: Diagnosis present

## 2020-04-27 DIAGNOSIS — Z79890 Hormone replacement therapy (postmenopausal): Secondary | ICD-10-CM

## 2020-04-27 DIAGNOSIS — E876 Hypokalemia: Secondary | ICD-10-CM | POA: Diagnosis present

## 2020-04-27 DIAGNOSIS — Z7982 Long term (current) use of aspirin: Secondary | ICD-10-CM

## 2020-04-27 DIAGNOSIS — I110 Hypertensive heart disease with heart failure: Secondary | ICD-10-CM | POA: Diagnosis present

## 2020-04-27 DIAGNOSIS — Z20822 Contact with and (suspected) exposure to covid-19: Secondary | ICD-10-CM | POA: Diagnosis present

## 2020-04-27 DIAGNOSIS — E7800 Pure hypercholesterolemia, unspecified: Secondary | ICD-10-CM | POA: Diagnosis present

## 2020-04-27 DIAGNOSIS — I2510 Atherosclerotic heart disease of native coronary artery without angina pectoris: Secondary | ICD-10-CM | POA: Diagnosis present

## 2020-04-27 DIAGNOSIS — I480 Paroxysmal atrial fibrillation: Secondary | ICD-10-CM | POA: Diagnosis present

## 2020-04-27 DIAGNOSIS — E782 Mixed hyperlipidemia: Secondary | ICD-10-CM | POA: Diagnosis present

## 2020-04-27 DIAGNOSIS — Z950 Presence of cardiac pacemaker: Secondary | ICD-10-CM

## 2020-04-27 DIAGNOSIS — K219 Gastro-esophageal reflux disease without esophagitis: Secondary | ICD-10-CM | POA: Diagnosis present

## 2020-04-27 DIAGNOSIS — I252 Old myocardial infarction: Secondary | ICD-10-CM

## 2020-04-27 DIAGNOSIS — Z79899 Other long term (current) drug therapy: Secondary | ICD-10-CM

## 2020-04-27 DIAGNOSIS — G8929 Other chronic pain: Secondary | ICD-10-CM | POA: Diagnosis present

## 2020-04-27 DIAGNOSIS — K559 Vascular disorder of intestine, unspecified: Secondary | ICD-10-CM | POA: Diagnosis present

## 2020-04-27 DIAGNOSIS — G40909 Epilepsy, unspecified, not intractable, without status epilepticus: Secondary | ICD-10-CM | POA: Diagnosis present

## 2020-04-27 DIAGNOSIS — Z6833 Body mass index (BMI) 33.0-33.9, adult: Secondary | ICD-10-CM

## 2020-04-27 DIAGNOSIS — E669 Obesity, unspecified: Secondary | ICD-10-CM | POA: Diagnosis present

## 2020-04-27 DIAGNOSIS — Z951 Presence of aortocoronary bypass graft: Secondary | ICD-10-CM

## 2020-04-27 DIAGNOSIS — D573 Sickle-cell trait: Secondary | ICD-10-CM | POA: Diagnosis present

## 2020-04-27 DIAGNOSIS — Z7984 Long term (current) use of oral hypoglycemic drugs: Secondary | ICD-10-CM

## 2020-04-27 LAB — CBC
HCT: 36.1 % (ref 36.0–46.0)
Hemoglobin: 11.7 g/dL — ABNORMAL LOW (ref 12.0–15.0)
MCH: 32.8 pg (ref 26.0–34.0)
MCHC: 32.4 g/dL (ref 30.0–36.0)
MCV: 101.1 fL — ABNORMAL HIGH (ref 80.0–100.0)
Platelets: 168 10*3/uL (ref 150–400)
RBC: 3.57 MIL/uL — ABNORMAL LOW (ref 3.87–5.11)
RDW: 11.8 % (ref 11.5–15.5)
WBC: 6.2 10*3/uL (ref 4.0–10.5)
nRBC: 0 % (ref 0.0–0.2)

## 2020-04-27 LAB — BASIC METABOLIC PANEL
Anion gap: 10 (ref 5–15)
BUN: 15 mg/dL (ref 8–23)
CO2: 26 mmol/L (ref 22–32)
Calcium: 8.6 mg/dL — ABNORMAL LOW (ref 8.9–10.3)
Chloride: 104 mmol/L (ref 98–111)
Creatinine, Ser: 1.2 mg/dL — ABNORMAL HIGH (ref 0.44–1.00)
GFR calc Af Amer: 50 mL/min — ABNORMAL LOW (ref >60)
GFR calc non Af Amer: 43 mL/min — ABNORMAL LOW (ref >60)
Glucose, Bld: 112 mg/dL — ABNORMAL HIGH (ref 70–99)
Potassium: 3.6 mmol/L (ref 3.5–5.1)
Sodium: 140 mmol/L (ref 135–145)

## 2020-04-27 LAB — TROPONIN I (HIGH SENSITIVITY): Troponin I (High Sensitivity): 62 ng/L — ABNORMAL HIGH (ref <18)

## 2020-04-27 MED ORDER — SODIUM CHLORIDE 0.9% FLUSH: 3.0000 mL | Freq: Once | INTRAVENOUS | Status: DC

## 2020-04-27 NOTE — ED Notes (Signed)
Pt called x 3  No answer. 

## 2020-04-27 NOTE — ED Provider Notes (Signed)
[facility] EMERGENCY DEPARTMENT Provider Note   CSN: [id number] Arrival date & time: 04/27/20  1649     History Chief Complaint  Patient presents with  . Chest Pain    Christine Gay is a 79 y.o. female.  Patient is a 79 year old female with history of coronary artery disease with CABG in the past, pacemaker, CHF, hypertension, diabetes.  Patient presents today with complaints of chest discomfort.  This started at approximately 230 this morning.  Patient states she was up pacing trying to get the pain to subside.  She did take nitroglycerin and shortly thereafter pain resolved.  It returned this afternoon and patient presents for evaluation of this.  She describes this as similar to what she experienced with her prior MI many years ago.  She denies any fevers or chills.  She denies any productive cough.  Patient most recent heart cath in 2019.  The history is provided by the patient.  Chest Pain Pain location:  Substernal area Pain quality: tightness   Pain radiates to:  Does not radiate Pain severity:  Moderate Onset quality:  Sudden Duration:  12 hours Timing:  Intermittent Relieved by:  Nitroglycerin Worsened by:  Nothing Associated symptoms: shortness of breath   Associated symptoms: no fever and no nausea        Past Medical History:  Diagnosis Date  . Anemia    a. Noted on labs 04/2013.  . Anginal pain ([place])   . Arthritis    "legs" (06/10/2014)  . CAD (coronary artery disease)    a. s/p CABG 1992, b. s/p PCI several years ago; c. LHC 11/08: L-LAD ok, S-OM1 ok, S-OM2 CTO, prox to mid CFX 70% (unchanged), RCA non-dominant, occluded;  d.  Echo 2/11: EF 55%, mod LVH, Ao sclerosis;  e. Echo 6/14: mild LVH, mild focal basal sept hypertrophy, EF 55-65%, inf HK, Mild AI, mild MR, mild BAE;  f. Myoview 7/14: int risk-> cath 04/2013 s/p PTCA/DES to prox Cx 05/04/13.  . Chronic diastolic CHF (congestive heart failure) ([place])   . GERD (gastroesophageal reflux  disease)   . Glaucoma   . High cholesterol   . Hypertension   . Hypokalemia 02/2017  . Hypothyroidism   . LBBB (left bundle branch block)    chronic  . Myocardial infarction ([place]) 03/1991 X 2; 07/1991  . Obesity   . Seizures ([place])    "I'm on Dilantin" (06/10/2014)  . Sickle cell trait ([place])   . Type II diabetes mellitus [facility])     Patient Active Problem List   Diagnosis Date Noted  . Pain due to onychomycosis of toenails of both feet 03/28/2019  . Coagulation disorder ([place]) 03/28/2019  . Chronic anticoagulation 03/23/2018  . Pacemaker 03/23/2018  . Abnormal nuclear cardiac imaging test   . NSTEMI (non-ST elevated myocardial infarction) ([place]) 03/18/2018  . Shoulder pain, left 03/09/2017  . Seizure ([place])   . Chest pain 03/08/2017  . Muscle spasm 03/08/2017  . Hypocalcemia 03/08/2017  . Encounter for therapeutic drug monitoring 02/08/2017  . Atrial fibrillation ([place]) 02/08/2017  . Complete heart block ([place]) 09/28/2016  . Third degree heart block ([place])   . Unstable angina ([place])   . CAD (coronary artery disease) 11/11/2014  . Dizziness 07/29/2014  . GI bleed 06/12/2014  . GERD (gastroesophageal reflux disease) 06/11/2014  . Type II or unspecified type diabetes mellitus without mention of complication, not stated as uncontrolled 06/11/2014  . Glaucoma 06/11/2014  . Microcytic hypochromic anemia 06/10/2014  .  Chronic diastolic CHF (congestive heart failure) ([place]) 08/09/2013  . Hypotension 05/18/2013  . Exertional dyspnea 03/15/2013  . CHF (congestive heart failure) ([place]) 03/15/2013  . MURMUR 03/02/2010  . Hypothyroidism 03/20/2009  . HYPERCHOLESTEROLEMIA  IIA 03/20/2009  . HYPOKALEMIA 03/20/2009  . HYPERTENSION, BENIGN 03/20/2009  . CAD, ARTERY BYPASS GRAFT 03/20/2009    Past Surgical History:  Procedure Laterality Date  . CARDIAC CATHETERIZATION     "several;  they went up to check"  . CARDIAC CATHETERIZATION N/A 09/28/2016   Procedure: Left Heart Cath and Coronary  Angiography;  Surgeon: Peter M Martinique, MD;  Location: [facility] CV LAB;  Service: Cardiovascular;  Laterality: N/A;  . CATARACT EXTRACTION W/ INTRAOCULAR LENS  IMPLANT, BILATERAL Bilateral   . CHOLECYSTECTOMY    . COLONOSCOPY N/A 06/12/2014   Procedure: COLONOSCOPY;  Surgeon: Cleotis Nipper, MD;  Location: [facility] ENDOSCOPY;  Service: Endoscopy;  Laterality: N/A;  . CORONARY ANGIOPLASTY WITH STENT PLACEMENT  ? date; 05/04/2013   ? location; DES  to [place]  . CORONARY ARTERY BYPASS GRAFT  07/1991   "CABG X 3"  . EP IMPLANTABLE DEVICE N/A 09/29/2016   Procedure: Pacemaker Implant;  Surgeon: Will Meredith Leeds, MD;  Location: [facility] CV LAB;  Service: Cardiovascular;  Laterality: N/A;  . ESOPHAGOGASTRODUODENOSCOPY N/A 06/12/2014   Procedure: ESOPHAGOGASTRODUODENOSCOPY (EGD);  Surgeon: Cleotis Nipper, MD;  Location: [facility] ENDOSCOPY;  Service: Endoscopy;  Laterality: N/A;  . LEFT AND RIGHT HEART CATHETERIZATION WITH CORONARY ANGIOGRAM N/A 05/04/2013   Procedure: LEFT AND RIGHT HEART CATHETERIZATION WITH CORONARY ANGIOGRAM;  Surgeon: Larey Dresser, MD;  Location: [facility] CATH LAB;  Service: Cardiovascular;  Laterality: N/A;  . LEFT HEART CATH AND CORS/GRAFTS ANGIOGRAPHY N/A 03/22/2018   Procedure: LEFT HEART CATH AND CORS/GRAFTS ANGIOGRAPHY;  Surgeon: Sherren Mocha, MD;  Location: [facility] CV LAB;  Service: Cardiovascular;  Laterality: N/A;  . PERCUTANEOUS STENT INTERVENTION  05/04/2013   Procedure: PERCUTANEOUS STENT INTERVENTION;  Surgeon: Larey Dresser, MD;  Location: [facility] CATH LAB;  Service: Cardiovascular;;  . TUBAL LIGATION Bilateral 1969     OB History   No obstetric history on file.     Family History  Problem Relation Age of Onset  . Diabetes Mother     Social History   Tobacco Use  . Smoking status: Never Smoker  . Smokeless tobacco: Never Used  Vaping Use  . Vaping Use: Never used  Substance Use Topics  . Alcohol use: No  . Drug use: No    Home Medications Prior to  Admission medications   Medication Sig Start Date End Date Taking? Authorizing Provider  Accu-Chek Softclix Lancets lancets  07/16/19   [provider]  acetaminophen (TYLENOL) 325 MG tablet Take 1-2 tablets (325-650 mg total) by mouth every 4 (four) hours as needed for mild pain. 09/30/16   Shirley Friar, PA-C  Blood Glucose Monitoring Suppl (ACCU-CHEK AVIVA PLUS) w/Device KIT  07/16/19   [provider]  carvedilol (COREG) 6.25 MG tablet TAKE 1/2 TABLET BY MOUTH 2 TIMES A DAY WITH A MEAL. 03/19/19   Larey Dresser, MD  cycloSPORINE (RESTASIS) 0.05 % ophthalmic emulsion Place 1 drop into both eyes 2 (two) times daily.    [provider]  diclofenac (FLECTOR) 1.3 % PTCH Place 1 patch onto the skin 2 (two) times daily. 04/04/19   Carmin Muskrat, MD  enoxaparin (LOVENOX) 120 MG/0.8ML injection Inject 0.8 mLs (120 mg total) into the skin daily. 12/07/18   Larey Dresser, MD  ezetimibe (ZETIA) 10 MG tablet TAKE 1 TABLET BY MOUTH DAILY. 01/21/20   Larey Dresser, MD  ferrous sulfate 325 (65 FE) MG tablet Take 325 mg by mouth daily with breakfast.    [provider]  furosemide (LASIX) 40 MG tablet TAKE 1 AND 1/2 TABLETS BY MOUTH 2 TIMES DAILY. 04/10/20   Bensimhon, Shaune Pascal, MD  glucose blood test strip Test blood glucose three times daily. 06/30/17   Larey Dresser, MD  ibandronate (BONIVA) 150 MG tablet Take 150 mg by mouth every 30 (thirty) days. Take in the morning with a full glass of water, on an empty stomach, and do not take anything else by mouth or lie down for the next 30 min.    [provider]  isosorbide mononitrate (IMDUR) 60 MG 24 hr tablet Take 1 tablet (60 mg total) by mouth 2 (two) times daily. 11/14/19   Larey Dresser, MD  levothyroxine (SYNTHROID, LEVOTHROID) 100 MCG tablet Take 100 mcg by mouth daily before breakfast.    [provider]  metFORMIN (GLUCOPHAGE-XR) 500 MG 24 hr tablet Take 500 mg by mouth 2 (two) times  daily.  05/08/13   Dunn, Nedra Hai, PA-C  nitroGLYCERIN (NITROSTAT) 0.4 MG SL tablet PLACE 1 TABLET (0.4 MG TOTAL) UNDER THE TONGUE EVERY 5 (FIVE) MINUTES AS NEEDED FOR CHEST PAIN. 05/12/18   Larey Dresser, MD  pantoprazole (PROTONIX) 40 MG tablet TAKE 1 TABLET BY MOUTH DAILY. 05/09/19   Larey Dresser, MD  phenytoin (DILANTIN) 100 MG ER capsule Take 100-200 mg by mouth 2 (two) times daily. 200 mg in the morning and 100 mg in the evening    [provider]  potassium chloride SA (KLOR-CON) 20 MEQ tablet TAKE 1 TABLET BY MOUTH 2 TIMES DAILY. 12/18/19   Larey Dresser, MD  rosuvastatin (CRESTOR) 40 MG tablet Take 1 tablet (40 mg total) by mouth daily. 05/03/19   Larey Dresser, MD  timolol (TIMOPTIC) 0.5 % ophthalmic solution Place 1 drop into both eyes daily.  09/21/16   [provider]  warfarin (COUMADIN) 5 MG tablet Take 1-1.5 tablets daily or as directed by Anticoagulation Clinic. 04/10/20   Larey Dresser, MD    Allergies    Meperidine hcl, Nitroglycerin, and Morphine  Review of Systems   Review of Systems  Constitutional: Negative for fever.  Respiratory: Positive for shortness of breath.   Cardiovascular: Positive for chest pain.  Gastrointestinal: Negative for nausea.  All other systems reviewed and are negative.   Physical Exam Updated Vital Signs BP (!) 131/69 (BP Location: Left Arm)   Pulse 63   Temp 98.4 F (36.9 C) (Oral)   Resp 18   SpO2 97%   Physical Exam Vitals and nursing note reviewed.  Constitutional:      General: She is not in acute distress.    Appearance: She is well-developed. She is not diaphoretic.  HENT:     Head: Normocephalic and atraumatic.  Cardiovascular:     Rate and Rhythm: Normal rate and regular rhythm.     Heart sounds: No murmur heard.  No friction rub. No gallop.   Pulmonary:     Effort: Pulmonary effort is normal. No respiratory distress.     Breath sounds: Normal breath sounds. No wheezing.  Abdominal:      General: Bowel sounds are normal. There is no distension.     Palpations: Abdomen is soft.     Tenderness: There is no abdominal tenderness.  Musculoskeletal:        General: Normal range of motion.     Cervical back: Normal range of motion and neck supple.     Right lower leg: No tenderness. No edema.     Left lower leg: No tenderness. No edema.  Skin:    General: Skin is warm and dry.  Neurological:     Mental Status: She is alert and oriented to person, place, and time.     ED Results / Procedures / Treatments   Labs (all labs ordered are listed, but only abnormal results are displayed) Labs Reviewed  BASIC METABOLIC PANEL - Abnormal; Notable for the following components:      Result Value   Glucose, Bld 112 (*)    Creatinine, Ser 1.20 (*)    Calcium 8.6 (*)    GFR calc non Af Amer 43 (*)    GFR calc Af Amer 50 (*)    All other components within normal limits  CBC - Abnormal; Notable for the following components:   RBC 3.57 (*)    Hemoglobin 11.7 (*)    MCV 101.1 (*)    All other components within normal limits  TROPONIN I (HIGH SENSITIVITY) - Abnormal; Notable for the following components:   Troponin I (High Sensitivity) 62 (*)    All other components within normal limits  TROPONIN I (HIGH SENSITIVITY)    EKG EKG Interpretation  Date/Time:  Sunday April 27 2020 16:55:07 EDT Ventricular Rate:  60 PR Interval:    QRS Duration: 172 QT Interval:  504 QTC Calculation: 504 R Axis:   -77 Text Interpretation: AV dual-paced rhythm Abnormal ECG No significant change since 06/29/2019 Confirmed by Veryl Speak [phone]) on 04/27/2020 11:02:43 PM   Radiology DG Chest 2 View  Result Date: 04/27/2020 CLINICAL DATA:  Chest pain EXAM: CHEST - 2 VIEW COMPARISON:  Chest x-ray dated 04/04/2019 FINDINGS: Stable cardiomegaly. LEFT chest wall pacemaker/ICD hardware appears stable. Lungs are clear. No pleural effusion or pneumothorax is seen. Mild degenerative spurring in the  midthoracic spine. No acute or suspicious osseous finding. Aortic atherosclerosis 2 IMPRESSION: No active cardiopulmonary disease. No evidence of pneumonia or pulmonary edema. Electronically Signed   By: Franki Cabot M.D.   On: 04/27/2020 17:28    Procedures Procedures (including critical care time)  Medications Ordered in ED Medications  sodium chloride flush (NS) 0.9 % injection 3 mL (has no administration in time range)    ED Course  I have reviewed the triage vital signs and the nursing notes.  Pertinent labs & imaging results that were available during my care of the patient were reviewed by me and considered in my medical decision making (see chart for details).    MDM Rules/Calculators/A&P  Patient presenting here with complaints of chest pain.  Patient with extensive cardiac history as described in the HPI.  Her symptoms today are consistent with what she experienced with her prior heart pain.  Initial EKG showing no acute abnormality, but troponin mildly elevated at 60.  Troponin repeated once patient roomed and returns at 2379.  Care discussed with cardiology.  Patient to be started on a heparin drip and admitted to the hospitalist service with cardiology consultation.  Patient tells me she is currently symptom-free.  CRITICAL CARE Performed by: Veryl Speak Total critical care time: 45 minutes Critical care time was exclusive of separately billable procedures and treating other patients. Critical care was necessary to treat or prevent imminent or life-threatening deterioration. Critical care was  time spent personally by me on the following activities: development of treatment plan with patient and/or surrogate as well as nursing, discussions with consultants, evaluation of patient's response to treatment, examination of patient, obtaining history from patient or surrogate, ordering and performing treatments and interventions, ordering and review of laboratory studies,  ordering and review of radiographic studies, pulse oximetry and re-evaluation of patient's condition.   Final Clinical Impression(s) / ED Diagnoses Final diagnoses:  None    Rx / DC Orders ED Discharge Orders    None       Veryl Speak, MD 04/28/20 0301

## 2020-04-27 NOTE — ED Triage Notes (Signed)
Patient from home with [facility] with complaint of sudden complaint of chest pressure at 1500 today. Took 1 SL NTG at home with some relief, received 2x SL NTG from EMS with some improvement. Received 324 mg aspirin PTA. 18g saline lock in right AC.

## 2020-04-28 ENCOUNTER — Inpatient Hospital Stay (HOSPITAL_COMMUNITY): Payer: Medicare Other

## 2020-04-28 ENCOUNTER — Other Ambulatory Visit: Payer: Self-pay

## 2020-04-28 ENCOUNTER — Encounter (HOSPITAL_COMMUNITY): Payer: Self-pay | Admitting: Internal Medicine

## 2020-04-28 ENCOUNTER — Inpatient Hospital Stay (HOSPITAL_COMMUNITY)
Admission: EM | Disposition: A | Discharge status: Home Health | Payer: Self-pay | Source: Home / Self Care | Attending: Internal Medicine

## 2020-04-28 DIAGNOSIS — I340 Nonrheumatic mitral (valve) insufficiency: Secondary | ICD-10-CM

## 2020-04-28 DIAGNOSIS — I5032 Chronic diastolic (congestive) heart failure: Secondary | ICD-10-CM

## 2020-04-28 DIAGNOSIS — I110 Hypertensive heart disease with heart failure: Secondary | ICD-10-CM | POA: Diagnosis present

## 2020-04-28 DIAGNOSIS — I214 Non-ST elevation (NSTEMI) myocardial infarction: Principal | ICD-10-CM

## 2020-04-28 DIAGNOSIS — K219 Gastro-esophageal reflux disease without esophagitis: Secondary | ICD-10-CM

## 2020-04-28 DIAGNOSIS — E782 Mixed hyperlipidemia: Secondary | ICD-10-CM

## 2020-04-28 DIAGNOSIS — I2510 Atherosclerotic heart disease of native coronary artery without angina pectoris: Secondary | ICD-10-CM | POA: Diagnosis present

## 2020-04-28 DIAGNOSIS — G40909 Epilepsy, unspecified, not intractable, without status epilepticus: Secondary | ICD-10-CM | POA: Diagnosis present

## 2020-04-28 DIAGNOSIS — Z7901 Long term (current) use of anticoagulants: Secondary | ICD-10-CM | POA: Diagnosis not present

## 2020-04-28 DIAGNOSIS — Z7984 Long term (current) use of oral hypoglycemic drugs: Secondary | ICD-10-CM | POA: Diagnosis not present

## 2020-04-28 DIAGNOSIS — I480 Paroxysmal atrial fibrillation: Secondary | ICD-10-CM | POA: Diagnosis present

## 2020-04-28 DIAGNOSIS — Z79890 Hormone replacement therapy (postmenopausal): Secondary | ICD-10-CM | POA: Diagnosis not present

## 2020-04-28 DIAGNOSIS — I351 Nonrheumatic aortic (valve) insufficiency: Secondary | ICD-10-CM

## 2020-04-28 DIAGNOSIS — I25810 Atherosclerosis of coronary artery bypass graft(s) without angina pectoris: Secondary | ICD-10-CM

## 2020-04-28 DIAGNOSIS — Z20822 Contact with and (suspected) exposure to covid-19: Secondary | ICD-10-CM | POA: Diagnosis present

## 2020-04-28 DIAGNOSIS — E119 Type 2 diabetes mellitus without complications: Secondary | ICD-10-CM

## 2020-04-28 DIAGNOSIS — K559 Vascular disorder of intestine, unspecified: Secondary | ICD-10-CM | POA: Diagnosis present

## 2020-04-28 DIAGNOSIS — D573 Sickle-cell trait: Secondary | ICD-10-CM | POA: Diagnosis present

## 2020-04-28 DIAGNOSIS — I361 Nonrheumatic tricuspid (valve) insufficiency: Secondary | ICD-10-CM

## 2020-04-28 DIAGNOSIS — E039 Hypothyroidism, unspecified: Secondary | ICD-10-CM

## 2020-04-28 DIAGNOSIS — E1169 Type 2 diabetes mellitus with other specified complication: Secondary | ICD-10-CM

## 2020-04-28 DIAGNOSIS — I252 Old myocardial infarction: Secondary | ICD-10-CM | POA: Diagnosis not present

## 2020-04-28 DIAGNOSIS — Z955 Presence of coronary angioplasty implant and graft: Secondary | ICD-10-CM | POA: Diagnosis not present

## 2020-04-28 DIAGNOSIS — G8929 Other chronic pain: Secondary | ICD-10-CM | POA: Diagnosis present

## 2020-04-28 DIAGNOSIS — Z951 Presence of aortocoronary bypass graft: Secondary | ICD-10-CM | POA: Diagnosis not present

## 2020-04-28 DIAGNOSIS — E669 Obesity, unspecified: Secondary | ICD-10-CM | POA: Diagnosis present

## 2020-04-28 DIAGNOSIS — E7800 Pure hypercholesterolemia, unspecified: Secondary | ICD-10-CM | POA: Diagnosis present

## 2020-04-28 DIAGNOSIS — Z79899 Other long term (current) drug therapy: Secondary | ICD-10-CM | POA: Diagnosis not present

## 2020-04-28 HISTORY — PX: LEFT HEART CATH AND CORS/GRAFTS ANGIOGRAPHY: CATH118250

## 2020-04-28 HISTORY — PX: CORONARY STENT INTERVENTION: CATH118234

## 2020-04-28 LAB — POCT ACTIVATED CLOTTING TIME: Activated Clotting Time: 312 seconds

## 2020-04-28 LAB — GLUCOSE, CAPILLARY
Glucose-Capillary: 60 mg/dL — ABNORMAL LOW (ref 70–99)
Glucose-Capillary: 109 mg/dL — ABNORMAL HIGH (ref 70–99)
Glucose-Capillary: 170 mg/dL — ABNORMAL HIGH (ref 70–99)

## 2020-04-28 LAB — COMPREHENSIVE METABOLIC PANEL
ALT: 26 U/L (ref 0–44)
AST: 44 U/L — ABNORMAL HIGH (ref 15–41)
Albumin: 3.4 g/dL — ABNORMAL LOW (ref 3.5–5.0)
Alkaline Phosphatase: 60 U/L (ref 38–126)
Anion gap: 9 (ref 5–15)
BUN: 14 mg/dL (ref 8–23)
CO2: 27 mmol/L (ref 22–32)
Calcium: 8.5 mg/dL — ABNORMAL LOW (ref 8.9–10.3)
Chloride: 104 mmol/L (ref 98–111)
Creatinine, Ser: 1.11 mg/dL — ABNORMAL HIGH (ref 0.44–1.00)
GFR calc Af Amer: 55 mL/min — ABNORMAL LOW (ref >60)
GFR calc non Af Amer: 47 mL/min — ABNORMAL LOW (ref >60)
Glucose, Bld: 89 mg/dL (ref 70–99)
Potassium: 3.5 mmol/L (ref 3.5–5.1)
Sodium: 140 mmol/L (ref 135–145)
Total Bilirubin: 0.6 mg/dL (ref 0.3–1.2)
Total Protein: 6.7 g/dL (ref 6.5–8.1)

## 2020-04-28 LAB — ECHOCARDIOGRAM COMPLETE
Area-P 1/2: 2.26 cm2
Calc EF: 50.2 %
P 1/2 time: 592 msec
S' Lateral: 2.8 cm
Single Plane A2C EF: 56.7 %
Single Plane A4C EF: 41 %

## 2020-04-28 LAB — TROPONIN I (HIGH SENSITIVITY)
Troponin I (High Sensitivity): 2379 ng/L (ref <18)
Troponin I (High Sensitivity): 2363 ng/L (ref <18)
Troponin I (High Sensitivity): 2273 ng/L (ref <18)
Troponin I (High Sensitivity): 3629 ng/L (ref <18)

## 2020-04-28 LAB — LIPID PANEL
Cholesterol: 166 mg/dL (ref 0–200)
HDL: 57 mg/dL (ref >40)
LDL Cholesterol: 92 mg/dL (ref 0–99)
Total CHOL/HDL Ratio: 2.9 RATIO
Triglycerides: 84 mg/dL (ref <150)
VLDL: 17 mg/dL (ref 0–40)

## 2020-04-28 LAB — APTT: aPTT: 29 seconds (ref 24–36)

## 2020-04-28 LAB — PROTIME-INR
INR: 1.6 — ABNORMAL HIGH (ref 0.8–1.2)
Prothrombin Time: 18 seconds — ABNORMAL HIGH (ref 11.4–15.2)

## 2020-04-28 LAB — MAGNESIUM: Magnesium: 2.2 mg/dL (ref 1.7–2.4)

## 2020-04-28 LAB — SARS CORONAVIRUS 2 BY RT PCR (HOSPITAL ORDER, PERFORMED IN ~~LOC~~ HOSPITAL LAB): SARS Coronavirus 2: NEGATIVE

## 2020-04-28 LAB — HEMOGLOBIN A1C
Hgb A1c MFr Bld: 5.7 % — ABNORMAL HIGH (ref 4.8–5.6)
Mean Plasma Glucose: 116.89 mg/dL

## 2020-04-28 LAB — LACTIC ACID, PLASMA
Lactic Acid, Venous: 0.9 mmol/L (ref 0.5–1.9)
Lactic Acid, Venous: 1.4 mmol/L (ref 0.5–1.9)

## 2020-04-28 LAB — HEMOGLOBIN AND HEMATOCRIT, BLOOD
HCT: 35.3 % — ABNORMAL LOW (ref 36.0–46.0)
Hemoglobin: 11.7 g/dL — ABNORMAL LOW (ref 12.0–15.0)

## 2020-04-28 LAB — CBG MONITORING, ED: Glucose-Capillary: 82 mg/dL (ref 70–99)

## 2020-04-28 LAB — TSH: TSH: 7.443 u[IU]/mL — ABNORMAL HIGH (ref 0.350–4.500)

## 2020-04-28 SURGERY — LEFT HEART CATH AND CORS/GRAFTS ANGIOGRAPHY
Anesthesia: LOCAL

## 2020-04-28 MED ORDER — MIDAZOLAM HCL 2 MG/2ML IJ SOLN
INTRAMUSCULAR | Status: AC
  Filled 2020-04-28: qty 2

## 2020-04-28 MED ORDER — MIDAZOLAM HCL 2 MG/2ML IJ SOLN
INTRAMUSCULAR | Status: DC | PRN
  Administered 2020-04-28 (×2): 1 mg via INTRAVENOUS

## 2020-04-28 MED ORDER — ASPIRIN EC 325 MG PO TBEC: 325.0000 mg | DELAYED_RELEASE_TABLET | Freq: Every day | ORAL | Status: DC

## 2020-04-28 MED ORDER — PHENYTOIN SODIUM EXTENDED 100 MG PO CAPS
200.0000 mg | ORAL_CAPSULE | Freq: Every day | ORAL | Status: DC
  Administered 2020-04-28 – 2020-04-30 (×3): 200 mg via ORAL
  Filled 2020-04-28 (×3): qty 2

## 2020-04-28 MED ORDER — ACETAMINOPHEN 325 MG PO TABS
650.0000 mg | ORAL_TABLET | ORAL | Status: DC | PRN
  Administered 2020-04-28 – 2020-04-29 (×2): 650 mg via ORAL
  Filled 2020-04-28 (×2): qty 2

## 2020-04-28 MED ORDER — CLOPIDOGREL BISULFATE 300 MG PO TABS
ORAL_TABLET | ORAL | Status: AC
  Filled 2020-04-28: qty 1

## 2020-04-28 MED ORDER — SODIUM CHLORIDE 0.9% FLUSH: 3.0000 mL | INTRAVENOUS | Status: DC | PRN

## 2020-04-28 MED ORDER — SODIUM CHLORIDE 0.9% FLUSH
3.0000 mL | Freq: Two times a day (BID) | INTRAVENOUS | Status: DC
  Administered 2020-04-30: 10 mL via INTRAVENOUS

## 2020-04-28 MED ORDER — FENTANYL CITRATE (PF) 100 MCG/2ML IJ SOLN
INTRAMUSCULAR | Status: DC | PRN
  Administered 2020-04-28 (×2): 25 ug via INTRAVENOUS

## 2020-04-28 MED ORDER — LIDOCAINE HCL (PF) 1 % IJ SOLN
INTRAMUSCULAR | Status: DC | PRN
  Administered 2020-04-28: 3 mL

## 2020-04-28 MED ORDER — TIMOLOL MALEATE 0.5 % OP SOLN
1.0000 [drp] | Freq: Two times a day (BID) | OPHTHALMIC | Status: DC
  Administered 2020-04-28 – 2020-04-30 (×4): 1 [drp] via OPHTHALMIC
  Filled 2020-04-28: qty 5

## 2020-04-28 MED ORDER — EZETIMIBE 10 MG PO TABS
10.0000 mg | ORAL_TABLET | Freq: Every evening | ORAL | Status: DC
  Administered 2020-04-28 – 2020-04-29 (×2): 10 mg via ORAL
  Filled 2020-04-28 (×3): qty 1

## 2020-04-28 MED ORDER — NITROGLYCERIN IN D5W 200-5 MCG/ML-% IV SOLN
0.0000 ug/min | INTRAVENOUS | Status: DC
  Administered 2020-04-28: 5 ug/min via INTRAVENOUS

## 2020-04-28 MED ORDER — CYCLOSPORINE 0.05 % OP EMUL
1.0000 [drp] | Freq: Two times a day (BID) | OPHTHALMIC | Status: DC
  Administered 2020-04-28 – 2020-04-30 (×4): 1 [drp] via OPHTHALMIC
  Filled 2020-04-28 (×6): qty 1

## 2020-04-28 MED ORDER — INSULIN ASPART 100 UNIT/ML ~~LOC~~ SOLN
0.0000 [IU] | Freq: Four times a day (QID) | SUBCUTANEOUS | Status: DC
  Administered 2020-04-29: 2 [IU] via SUBCUTANEOUS

## 2020-04-28 MED ORDER — PANTOPRAZOLE SODIUM 40 MG IV SOLR
40.0000 mg | Freq: Every day | INTRAVENOUS | Status: DC
  Administered 2020-04-28 – 2020-04-30 (×3): 40 mg via INTRAVENOUS
  Filled 2020-04-28 (×3): qty 40

## 2020-04-28 MED ORDER — ONDANSETRON HCL 4 MG/2ML IJ SOLN
INTRAMUSCULAR | Status: AC
  Filled 2020-04-28: qty 2

## 2020-04-28 MED ORDER — IOHEXOL 350 MG/ML SOLN
INTRAVENOUS | Status: DC | PRN
  Administered 2020-04-28: 20 mL via INTRA_ARTERIAL

## 2020-04-28 MED ORDER — ASPIRIN 300 MG RE SUPP: 300.0000 mg | RECTAL | Status: AC

## 2020-04-28 MED ORDER — SODIUM CHLORIDE 0.9 % IV SOLN
INTRAVENOUS | Status: AC | PRN
  Administered 2020-04-28: 75 mL/h via INTRAVENOUS

## 2020-04-28 MED ORDER — HEPARIN (PORCINE) IN NACL 1000-0.9 UT/500ML-% IV SOLN
INTRAVENOUS | Status: DC | PRN
  Administered 2020-04-28 (×2): 500 mL

## 2020-04-28 MED ORDER — HEPARIN SODIUM (PORCINE) 1000 UNIT/ML IJ SOLN
INTRAMUSCULAR | Status: DC | PRN
  Administered 2020-04-28: 4000 [IU] via INTRAVENOUS

## 2020-04-28 MED ORDER — SODIUM CHLORIDE 0.9 % IV SOLN: 250.0000 mL | INTRAVENOUS | Status: DC | PRN

## 2020-04-28 MED ORDER — ISOSORBIDE MONONITRATE ER 60 MG PO TB24
60.0000 mg | ORAL_TABLET | Freq: Two times a day (BID) | ORAL | Status: DC
  Administered 2020-04-28 – 2020-04-30 (×5): 60 mg via ORAL
  Filled 2020-04-28: qty 1
  Filled 2020-04-28: qty 2
  Filled 2020-04-28 (×3): qty 1

## 2020-04-28 MED ORDER — IOHEXOL 350 MG/ML SOLN
INTRAVENOUS | Status: DC | PRN
  Administered 2020-04-28: 90 mL

## 2020-04-28 MED ORDER — MIDAZOLAM HCL 2 MG/2ML IJ SOLN
INTRAMUSCULAR | Status: DC | PRN
  Administered 2020-04-28: 2 mg via INTRAVENOUS

## 2020-04-28 MED ORDER — CLOPIDOGREL BISULFATE 300 MG PO TABS
ORAL_TABLET | ORAL | Status: DC | PRN
  Administered 2020-04-28: 600 mg via ORAL

## 2020-04-28 MED ORDER — PHENYTOIN SODIUM EXTENDED 100 MG PO CAPS
100.0000 mg | ORAL_CAPSULE | Freq: Every day | ORAL | Status: DC
  Administered 2020-04-28 – 2020-04-29 (×2): 100 mg via ORAL
  Filled 2020-04-28 (×3): qty 1

## 2020-04-28 MED ORDER — NITROGLYCERIN IN D5W 200-5 MCG/ML-% IV SOLN
INTRAVENOUS | Status: AC
  Filled 2020-04-28: qty 250

## 2020-04-28 MED ORDER — HEPARIN SODIUM (PORCINE) 1000 UNIT/ML IJ SOLN
INTRAMUSCULAR | Status: AC
  Filled 2020-04-28: qty 1

## 2020-04-28 MED ORDER — SODIUM CHLORIDE 0.9 % WEIGHT BASED INFUSION: 1.0000 mL/kg/h | INTRAVENOUS | Status: AC

## 2020-04-28 MED ORDER — NITROGLYCERIN 0.4 MG SL SUBL
0.4000 mg | SUBLINGUAL_TABLET | SUBLINGUAL | Status: DC | PRN
  Administered 2020-04-28 (×2): 0.4 mg via SUBLINGUAL
  Filled 2020-04-28: qty 1

## 2020-04-28 MED ORDER — FENTANYL CITRATE (PF) 100 MCG/2ML IJ SOLN
INTRAMUSCULAR | Status: DC | PRN
  Administered 2020-04-28: 50 ug via INTRAVENOUS

## 2020-04-28 MED ORDER — CARVEDILOL 3.125 MG PO TABS
3.1250 mg | ORAL_TABLET | Freq: Two times a day (BID) | ORAL | Status: DC
  Administered 2020-04-28 – 2020-04-30 (×5): 3.125 mg via ORAL
  Filled 2020-04-28 (×5): qty 1

## 2020-04-28 MED ORDER — LIDOCAINE VISCOUS HCL 2 % MT SOLN
15.0000 mL | Freq: Once | OROMUCOSAL | Status: AC
  Administered 2020-04-28: 15 mL via ORAL
  Filled 2020-04-28: qty 15

## 2020-04-28 MED ORDER — VERAPAMIL HCL 2.5 MG/ML IV SOLN
INTRAVENOUS | Status: DC | PRN
  Administered 2020-04-28 (×4): 200 ug via INTRACORONARY

## 2020-04-28 MED ORDER — PHENYTOIN SODIUM EXTENDED 100 MG PO CAPS: 100.0000 mg | ORAL_CAPSULE | ORAL | Status: DC

## 2020-04-28 MED ORDER — WARFARIN - PHARMACIST DOSING INPATIENT: Freq: Every day | Status: DC

## 2020-04-28 MED ORDER — ASPIRIN 81 MG PO CHEW
324.0000 mg | CHEWABLE_TABLET | ORAL | Status: AC
  Administered 2020-04-28: 324 mg via ORAL
  Filled 2020-04-28: qty 4

## 2020-04-28 MED ORDER — ONDANSETRON HCL 4 MG/2ML IJ SOLN
INTRAMUSCULAR | Status: DC | PRN
  Administered 2020-04-28: 4 mg via INTRAVENOUS

## 2020-04-28 MED ORDER — PANTOPRAZOLE SODIUM 40 MG PO TBEC: 40.0000 mg | DELAYED_RELEASE_TABLET | Freq: Every day | ORAL | Status: DC

## 2020-04-28 MED ORDER — SODIUM CHLORIDE 0.9 % IV SOLN: INTRAVENOUS | Status: DC

## 2020-04-28 MED ORDER — LIDOCAINE HCL (PF) 1 % IJ SOLN
INTRAMUSCULAR | Status: AC
  Filled 2020-04-28: qty 30

## 2020-04-28 MED ORDER — WARFARIN SODIUM 7.5 MG PO TABS
7.5000 mg | ORAL_TABLET | Freq: Once | ORAL | Status: AC
  Administered 2020-04-28: 7.5 mg via ORAL
  Filled 2020-04-28 (×2): qty 1

## 2020-04-28 MED ORDER — ONDANSETRON HCL 4 MG/2ML IJ SOLN: 4.0000 mg | Freq: Four times a day (QID) | INTRAMUSCULAR | Status: DC | PRN

## 2020-04-28 MED ORDER — HEPARIN SODIUM (PORCINE) 1000 UNIT/ML IJ SOLN
INTRAMUSCULAR | Status: DC | PRN
  Administered 2020-04-28: 5000 [IU] via INTRAVENOUS

## 2020-04-28 MED ORDER — FUROSEMIDE 40 MG PO TABS
60.0000 mg | ORAL_TABLET | Freq: Two times a day (BID) | ORAL | Status: DC
  Administered 2020-04-28 – 2020-04-30 (×4): 60 mg via ORAL
  Filled 2020-04-28 (×4): qty 1

## 2020-04-28 MED ORDER — HEPARIN (PORCINE) IN NACL 1000-0.9 UT/500ML-% IV SOLN
INTRAVENOUS | Status: AC
  Filled 2020-04-28: qty 1000

## 2020-04-28 MED ORDER — VERAPAMIL HCL 2.5 MG/ML IV SOLN
INTRAVENOUS | Status: AC
  Filled 2020-04-28: qty 2

## 2020-04-28 MED ORDER — VERAPAMIL HCL 2.5 MG/ML IV SOLN
INTRAVENOUS | Status: DC | PRN
  Administered 2020-04-28: 10 mL via INTRA_ARTERIAL

## 2020-04-28 MED ORDER — HYDRALAZINE HCL 20 MG/ML IJ SOLN: 10.0000 mg | INTRAMUSCULAR | Status: AC | PRN

## 2020-04-28 MED ORDER — NITROGLYCERIN 1 MG/10 ML FOR IR/CATH LAB
INTRA_ARTERIAL | Status: AC
  Filled 2020-04-28: qty 10

## 2020-04-28 MED ORDER — FENTANYL CITRATE (PF) 100 MCG/2ML IJ SOLN
INTRAMUSCULAR | Status: AC
  Filled 2020-04-28: qty 2

## 2020-04-28 MED ORDER — ALUM & MAG HYDROXIDE-SIMETH 200-200-20 MG/5ML PO SUSP
30.0000 mL | Freq: Once | ORAL | Status: AC
  Administered 2020-04-28: 30 mL via ORAL
  Filled 2020-04-28: qty 30

## 2020-04-28 MED ORDER — SODIUM CHLORIDE 0.9% FLUSH: 3.0000 mL | Freq: Two times a day (BID) | INTRAVENOUS | Status: DC

## 2020-04-28 MED ORDER — FUROSEMIDE 20 MG PO TABS: 60.0000 mg | ORAL_TABLET | Freq: Two times a day (BID) | ORAL | Status: DC

## 2020-04-28 MED ORDER — LEVOTHYROXINE SODIUM 100 MCG PO TABS
100.0000 ug | ORAL_TABLET | Freq: Every day | ORAL | Status: DC
  Administered 2020-04-28 – 2020-04-30 (×3): 100 ug via ORAL
  Filled 2020-04-28 (×3): qty 1

## 2020-04-28 MED ORDER — CLOPIDOGREL BISULFATE 75 MG PO TABS
75.0000 mg | ORAL_TABLET | Freq: Every day | ORAL | Status: DC
  Administered 2020-04-29 – 2020-04-30 (×2): 75 mg via ORAL
  Filled 2020-04-28 (×2): qty 1

## 2020-04-28 MED ORDER — LABETALOL HCL 5 MG/ML IV SOLN: 10.0000 mg | INTRAVENOUS | Status: AC | PRN

## 2020-04-28 MED ORDER — ROSUVASTATIN CALCIUM 20 MG PO TABS
40.0000 mg | ORAL_TABLET | Freq: Every evening | ORAL | Status: DC
  Administered 2020-04-28 – 2020-04-29 (×3): 40 mg via ORAL
  Filled 2020-04-28 (×3): qty 2

## 2020-04-28 MED ORDER — HEPARIN BOLUS VIA INFUSION
1500.0000 [IU] | Freq: Once | INTRAVENOUS | Status: AC
  Administered 2020-04-28: 1500 [IU] via INTRAVENOUS
  Filled 2020-04-28: qty 1500

## 2020-04-28 MED ORDER — HEPARIN (PORCINE) 25000 UT/250ML-% IV SOLN
850.0000 [IU]/h | INTRAVENOUS | Status: DC
  Administered 2020-04-28: 850 [IU]/h via INTRAVENOUS
  Filled 2020-04-28: qty 250

## 2020-04-28 SURGICAL SUPPLY — 20 items
BALLN SAPPHIRE 2.5X15 (BALLOONS) ×2
BALLN SAPPHIRE ~~LOC~~ 3.5X15 (BALLOONS) ×1 IMPLANT
BALLOON SAPPHIRE 2.5X15 (BALLOONS) IMPLANT
CATH INFINITI 5FR MULTPACK ANG (CATHETERS) ×1 IMPLANT
CATH LAUNCHER 6FR AL1 (CATHETERS) IMPLANT
CATHETER LAUNCHER 6FR AL1 (CATHETERS) ×2
DEVICE RAD COMP TR BAND LRG (VASCULAR PRODUCTS) ×1 IMPLANT
GLIDESHEATH SLEND SS 6F .021 (SHEATH) ×1 IMPLANT
GUIDEWIRE INQWIRE 1.5J.035X260 (WIRE) IMPLANT
INQWIRE 1.5J .035X260CM (WIRE) ×2
KIT ENCORE 26 ADVANTAGE (KITS) ×1 IMPLANT
KIT HEART LEFT (KITS) ×2 IMPLANT
PACK CARDIAC CATHETERIZATION (CUSTOM PROCEDURE TRAY) ×2 IMPLANT
SHEATH PROBE COVER 6X72 (BAG) ×1 IMPLANT
STENT SYNERGY XD 3.0X20 (Permanent Stent) IMPLANT
SYNERGY XD 3.0X20 (Permanent Stent) ×2 IMPLANT
TRANSDUCER W/STOPCOCK (MISCELLANEOUS) ×2 IMPLANT
TUBING CIL FLEX 10 FLL-RA (TUBING) ×2 IMPLANT
WIRE AMPLATZ ST .035X145CM (WIRE) ×1 IMPLANT
WIRE COUGAR XT STRL 190CM (WIRE) ×1 IMPLANT

## 2020-04-28 NOTE — Progress Notes (Addendum)
Progress Note  Patient Name: Christine Gay Date of Encounter: 04/28/2020  Primary Cardiologist:  [name], MD  Subjective   CP resolved w/ nitro, but since being in ER, she has developed severe abd pain, all over her abdomen. Nitro now off, GI cocktail tried, she vomited it up  Inpatient Medications    Scheduled Meds: . [START ON 04/29/2020] aspirin EC  325 mg Oral Daily  . carvedilol  3.125 mg Oral BID WC  . cycloSPORINE  1 drop Both Eyes BID  . ezetimibe  10 mg Oral QPM  . furosemide  60 mg Oral BID  . insulin aspart  0-15 Units Subcutaneous Q6H  . isosorbide mononitrate  60 mg Oral BID  . levothyroxine  100 mcg Oral QAC breakfast  . pantoprazole (PROTONIX) IV  40 mg Intravenous Daily  . phenytoin  200 mg Oral Daily   And  . phenytoin  100 mg Oral QHS  . rosuvastatin  40 mg Oral QPM  . sodium chloride flush  3 mL Intravenous Once  . timolol  1 drop Both Eyes BID   Continuous Infusions: . heparin 850 Units/hr (04/28/20 1194)  . nitroGLYCERIN Stopped (04/28/20 1740)   PRN Meds: acetaminophen, nitroGLYCERIN, ondansetron (ZOFRAN) IV   Vital Signs    Vitals:   04/28/20 0556 04/28/20 0600 04/28/20 0620 04/28/20 0627  BP: (!) 154/74 (!) 129/86 104/76 115/72  Pulse:  61 65 59  Resp: 17 17 22 13   Temp:      TempSrc:      SpO2:  96% 96% 98%   No intake or output data in the 24 hours ending 04/28/20 0709 There were no vitals filed for this visit.  Weight change:    Telemetry    A paced, +ectopy - Personally Reviewed  ECG    AV paced, HR 60, LBBB is old, PVCs and PACs - Personally Reviewed  Physical Exam   General: Well developed, well nourished, female appearing in acute distress. Head: Normocephalic, atraumatic.  Neck: Supple without bruits, JVD 9 cm. Lungs:  Resp regular and unlabored, decreased BS bases, minimal exp wheeze. Heart: mostly RRR, S1, S2, no S3, S4, or murmur; no rub. Abdomen: Soft, ++tender, non-distended with normoactive bowel  sounds. No hepatomegaly. No rebound/guarding. No obvious abdominal masses. Extremities: No clubbing, cyanosis, no edema. Distal pedal pulses are 2+ bilaterally. Neuro: Alert and oriented X 3. Moves all extremities spontaneously. Psych: Normal affect.  Labs    Hematology Recent Labs  Lab 04/27/20 1705  WBC 6.2  RBC 3.57*  HGB 11.7*  HCT 36.1  MCV 101.1*  MCH 32.8  MCHC 32.4  RDW 11.8  PLT 168    Chemistry Recent Labs  Lab 04/27/20 1705 04/28/20 0525  NA 140 140  K 3.6 3.5  CL 104 104  CO2 26 27  GLUCOSE 112* 89  BUN 15 14  CREATININE 1.20* 1.11*  CALCIUM 8.6* 8.5*  PROT  --  6.7  ALBUMIN  --  3.4*  AST  --  44*  ALT  --  26  ALKPHOS  --  60  BILITOT  --  0.6  GFRNONAA 43* 47*  GFRAA 50* 55*  ANIONGAP 10 9     High Sensitivity Troponin:   Recent Labs  Lab 04/27/20 1705 04/28/20 0104  TROPONINIHS 62* 2,379*     Lab Results  Component Value Date   INR 1.6 (H) 04/28/2020   INR 2.1 03/20/2020   INR 2.0 02/07/2020    BNPNo  results for input(s): BNP, PROBNP in the last 168 hours.   DDimer No results for input(s): DDIMER in the last 168 hours.   Radiology    DG Chest 2 View  Result Date: 04/27/2020 CLINICAL DATA:  Chest pain EXAM: CHEST - 2 VIEW COMPARISON:  Chest x-ray dated 04/04/2019 FINDINGS: Stable cardiomegaly. LEFT chest wall pacemaker/ICD hardware appears stable. Lungs are clear. No pleural effusion or pneumothorax is seen. Mild degenerative spurring in the midthoracic spine. No acute or suspicious osseous finding. Aortic atherosclerosis 2 IMPRESSION: No active cardiopulmonary disease. No evidence of pneumonia or pulmonary edema. Electronically Signed   By: Franki Cabot M.D.   On: 04/27/2020 17:28   DG Ankle Complete Left  Result Date: 04/24/2020 CLINICAL DATA:  79 year old female with fall and trauma to the left ankle. EXAM: LEFT ANKLE COMPLETE - 3+ VIEW COMPARISON:  None. FINDINGS: There is no acute fracture or dislocation. The bones are  osteopenic. The ankle mortise is intact. There is diffuse subcutaneous edema. IMPRESSION: No acute fracture or dislocation. Electronically Signed   By: Anner Crete M.D.   On: 04/24/2020 20:16     Cardiac Studies   ECHO: ordered CATH: ordered   Patient Profile     79 y.o. female w/ hx  CAD s/p CABG, DM, and HTN presents for followup of CHF and CAD.  She had a Lexiscan Cardiolite in 2014 that was an intermediate risk study and echo showed basal to mid inferior hypokinesis with EF 55-60%.  I did a cardaic catheterization in 8/14 that showed patent LIMA-LAD, occluded SVG-OM1, and 95% ostial LCx stenosis.  She had PCI to ostial LCx with Promus DES.  Cardiolite in 5/15 showed no ischemia or infarction.  She had an upper GI bleed in 9/15, she is now off Plavix.   In 1/18, she was admitted with severe fatigue and dyspnea and was found to be in complete heart block.  Troponin was up to 4.9.   She had LHC showing patent LIMA-LAD and SVG-OM1, patent LCx stent, and occluded SVG-OM2 (known from past), no evidence for plaque rupture/new disease. Echo showed EF stable at 50-55%.  She had [employer] Jude PPM placed.   She was started on warfarin due to paroxysmal atrial fibrillation noted by device interrogation.   She was admitted with chest pain in 6/19, concerning for unstable angina.  LHC showed stable anatomy compared to 1/18 cath. Echo showed EF 50-55%.   Admitted 08/01 with CP, ez c/w NSTEMI.   Assessment & Plan    1. NSTEMI  - CP resolved w/ nitrates, ECG w/ LBBB at baseline - continue to cycle trops - pt on cath board, but need to figure out abd issue first  2. Abd pain - will order stat H&H, stat CT abd w/out IV contrast, will not use oral contrast either since pt vomited - will message Dr Eliseo Squires about this, but need to get her stabilized before cath  3. Afib - ECG currently appears to be AV paced w/ ectopy - INR subtherapeutic, but coumadin on hold for cath, on heparin  4. Chronic CHF -  no wt now, wt 07/29 when she was in ER w/ ankle sprain, was 176, 6 lbs up from 06/2019 wt, up 13 lbs from 03/2019 - some JVD but CXR is ok  Otherwise, per IM Principal Problem:   NSTEMI (non-ST elevated myocardial infarction) [facility]) Active Problems:   Hypothyroidism   Chronic diastolic CHF (congestive heart failure) ([facility])   GERD (gastroesophageal reflux disease)  Type 2 diabetes mellitus without complication, without long-term current use of insulin ([facility])   AF (paroxysmal atrial fibrillation) ([place])   Mixed diabetic hyperlipidemia associated with type 2 diabetes mellitus ([place])  Signed, Rosaria Ferries , PA-C 7:09 AM 04/28/2020 Pager: [phone]  Patient seen with PA, agree with the above note.   Patient presented with NSTEMI.  She developed abdominal pain today with resolution of chest pain. CT abdomen/pelvis showed possible area of ischemic colitis at splenic flexure.  Lactate normal.   She was seen by GI and ok'd for cath and possible intervention.   Cath today showed tight stenosis ostial SVG-OM2, likely culprit.  Plan for PCI by Dr. Burt Knack.    She will get Plavix for antiplatelet agent, will need to restart coumadin for paroxysmal atrial fibrillation.   [name] 04/28/2020 3:49 PM

## 2020-04-28 NOTE — ED Notes (Signed)
Cards PA at bedside. 

## 2020-04-28 NOTE — Consult Note (Addendum)
CARDIOLOGY CONSULT NOTE  Patient ID: Christine Gay MRN: [id number] DOB/AGE: [birth year] 79 y.o.  Admit date: 04/27/2020 Primary Cardiologist Dr. Aundra Dubin Chief Complaint  Chest Pressure Requesting  ED  HPI:  Christine Gay is a 79 year old woman with CAD s/p remote CABG (1992) and PCI, DMII (A1c 5.6% in 2019),  HTN, paroxysmal atrial fibrillation (on warfarin), HFpEF, CHB s/p St. Jude dc-PPM, hypothyroidism, and seizure disorder who presents for evaluation of chest pressure and was subsequently diagnosed with an NSTEMI.  Cardiology is consulted for assistance with evaluation and management.  Patient was in her usual state of health until approximately 230 on 04/27/2020 when she developed dull, squeezing, pressure-like chest pain.  At worst she rates the pain as office scale, but it was intermittent/stuttering in quality.  Did not improve with position changes or worsen with deep inspiration.  She took 1 dose of her sublingual nitroglycerin, which she had not taken over 2 years, and after about 1-1.5 hours she felt well enough to go to sleep.   When she woke later in the morning of 8/1, patient reports that she felt overall well. However her pain returned around noon or so while she was making lunch.  She again took 1 dose of sublingual nitroglycerin, but this time her pain did not improve and so she called EMS for further assistance and was brought to the [facility] for evaluation.  She was given full dose aspirin via EMS and sublingual nitroglycerin which reportedly made Christine Gay' pain dramatically better.  The ED her initial vital signs showed BP 144/71, heart rate 67, satting 99% on room air, and afebrile.  For creatinine of 1.20 (was 0.96 in late 2020).  Hemoglobin was 11.7 stays (stable from prior) and there is no leukocytosis.  Troponin was 62 and this up trended to 2379 on recheck 8 hours later.  She was started on a heparin drip thereafter.  At the time of my interview, patient states that she is  comfortable and denies any active chest pain, exertional chest pressure/discomfort, dyspnea/tachypnea, paroxysmal nocturnal dyspnea/orthopnea, irregular heart beat/palpitations, presyncope/syncope, lower extremity edema, claudication, or abdominal distention. She is reporting some shoulder pain, with her right being the most problematic. She denies tobacco or illicit drug use. She has been adherent with her medication regimen.   Past Medical History:  Diagnosis Date  . Anemia    a. Noted on labs 04/2013.  . Anginal pain ([place])   . Arthritis    "legs" (06/10/2014)  . CAD (coronary artery disease)    a. s/p CABG 1992, b. s/p PCI several years ago; c. LHC 11/08: L-LAD ok, S-OM1 ok, S-OM2 CTO, prox to mid CFX 70% (unchanged), RCA non-dominant, occluded;  d.  Echo 2/11: EF 55%, mod LVH, Ao sclerosis;  e. Echo 6/14: mild LVH, mild focal basal sept hypertrophy, EF 55-65%, inf HK, Mild AI, mild MR, mild BAE;  f. Myoview 7/14: int risk-> cath 04/2013 s/p PTCA/DES to prox Cx 05/04/13.  . Chronic diastolic CHF (congestive heart failure) ([place])   . GERD (gastroesophageal reflux disease)   . Glaucoma   . High cholesterol   . Hypertension   . Hypokalemia 02/2017  . Hypothyroidism   . LBBB (left bundle branch block)    chronic  . Myocardial infarction ([place]) 03/1991 X 2; 07/1991  . Obesity   . Seizures ([place])    "I'm on Dilantin" (06/10/2014)  . Sickle cell trait ([place])   . Type II diabetes mellitus ([place])  Past Surgical History:  Procedure Laterality Date  . CARDIAC CATHETERIZATION     "several;  they went up to check"  . CARDIAC CATHETERIZATION N/A 09/28/2016   Procedure: Left Heart Cath and Coronary Angiography;  Surgeon: Peter M Martinique, MD;  Location: [place] CV LAB;  Service: Cardiovascular;  Laterality: N/A;  . CATARACT EXTRACTION W/ INTRAOCULAR LENS  IMPLANT, BILATERAL Bilateral   . CHOLECYSTECTOMY    . COLONOSCOPY N/A 06/12/2014   Procedure: COLONOSCOPY;  Surgeon: Cleotis Nipper, MD;  Location:  [facility] ENDOSCOPY;  Service: Endoscopy;  Laterality: N/A;  . CORONARY ANGIOPLASTY WITH STENT PLACEMENT  ? date; 05/04/2013   ? location; DES  to [place]  . CORONARY ARTERY BYPASS GRAFT  07/1991   "CABG X 3"  . EP IMPLANTABLE DEVICE N/A 09/29/2016   Procedure: Pacemaker Implant;  Surgeon: Will Meredith Leeds, MD;  Location: [place] CV LAB;  Service: Cardiovascular;  Laterality: N/A;  . ESOPHAGOGASTRODUODENOSCOPY N/A 06/12/2014   Procedure: ESOPHAGOGASTRODUODENOSCOPY (EGD);  Surgeon: Cleotis Nipper, MD;  Location: [facility] ENDOSCOPY;  Service: Endoscopy;  Laterality: N/A;  . LEFT AND RIGHT HEART CATHETERIZATION WITH CORONARY ANGIOGRAM N/A 05/04/2013   Procedure: LEFT AND RIGHT HEART CATHETERIZATION WITH CORONARY ANGIOGRAM;  Surgeon: Larey Dresser, MD;  Location: [facility] CATH LAB;  Service: Cardiovascular;  Laterality: N/A;  . LEFT HEART CATH AND CORS/GRAFTS ANGIOGRAPHY N/A 03/22/2018   Procedure: LEFT HEART CATH AND CORS/GRAFTS ANGIOGRAPHY;  Surgeon: Sherren Mocha, MD;  Location: [place] CV LAB;  Service: Cardiovascular;  Laterality: N/A;  . PERCUTANEOUS STENT INTERVENTION  05/04/2013   Procedure: PERCUTANEOUS STENT INTERVENTION;  Surgeon: Larey Dresser, MD;  Location: [facility] CATH LAB;  Service: Cardiovascular;;  . TUBAL LIGATION Bilateral 1969    Allergies  Allergen Reactions  . Meperidine Hcl Other (See Comments)    Demerol - mental status changes    . Nitroglycerin Other (See Comments)    Patch and Cream only - blisters  . Morphine Palpitations   (Not in a hospital admission)  Family History  Problem Relation Age of Onset  . Diabetes Mother     Social History   Socioeconomic History  . Marital status: Married    Spouse name: Not on file  . Number of children: Not on file  . Years of education: Not on file  . Highest education level: Not on file  Occupational History  . Not on file  Tobacco Use  . Smoking status: Never Smoker  . Smokeless tobacco: Never Used  Vaping Use  . Vaping  Use: Never used  Substance and Sexual Activity  . Alcohol use: No  . Drug use: No  . Sexual activity: Never  Other Topics Concern  . Not on file  Social History Narrative  . Not on file   Social Determinants of Health   Financial Resource Strain:   . Difficulty of Paying Living Expenses:   Food Insecurity:   . Worried About Charity fundraiser in the Last Year:   . Arboriculturist in the Last Year:   Transportation Needs:   . Film/video editor (Medical):   Marland Kitchen Lack of Transportation (Non-Medical):   Physical Activity:   . Days of Exercise per Week:   . Minutes of Exercise per Session:   Stress:   . Feeling of Stress :   Social Connections:   . Frequency of Communication with Friends and Family:   . Frequency of Social Gatherings with Friends and Family:   . Attends  Religious Services:   . Active Member of Clubs or Organizations:   . Attends Archivist Meetings:   Marland Kitchen Marital Status:   Intimate Partner Violence:   . Fear of Current or Ex-Partner:   . Emotionally Abused:   Marland Kitchen Physically Abused:   . Sexually Abused:      Review of Systems: [y] = yes, [ ]  = no       General: Weight gain [] ; Weight loss [ ] ; Anorexia [ ] ; Fatigue [ ] ; Fever [ ] ; Chills [ ] ; Weakness [ ]     Cardiac: Chest pain/pressure [y]; Resting SOB [ ] ; Exertional SOB [ ] ; Orthopnea [ ] ; Pedal Edema [ ] ; Palpitations [ ] ; Syncope [ ] ; Presyncope [ ] ; Paroxysmal nocturnal dyspnea[ ]     Pulmonary: Cough [ ] ; Wheezing[ ] ; Hemoptysis[ ] ; Sputum [ ] ; Snoring [ ]     GI: Vomiting[ ] ; Dysphagia[ ] ; Melena[ ] ; Hematochezia [ ] ; Heartburn[ ] ; Abdominal pain [ ] ; Constipation [ ] ; Diarrhea [ ] ; BRBPR [ ]     GU: Hematuria[ ] ; Dysuria [ ] ; Nocturia[ ]   Vascular: Pain in legs with walking [ ] ; Pain in feet with lying flat [ ] ; Non-healing sores [ ] ; Stroke [ ] ; TIA [ ] ; Slurred speech [ ] ;    Neuro: Headaches[ ] ; Vertigo[ ] ; Seizures[ ] ; Paresthesias[ ] ;Blurred vision [ ] ; Diplopia [ ] ; Vision changes  [ ]     Ortho/Skin: Arthritis [ ] ; Joint pain [ y]; Muscle pain [ ] ; Joint swelling [ ] ; Back Pain [ ] ; Rash [ ]     Psych: Depression[ ] ; Anxiety[ ]     Heme: Bleeding problems [ ] ; Clotting disorders [ ] ; Anemia [ ]     Endocrine: Diabetes [ ] ; Thyroid dysfunction[ ]   Physical Exam: Blood pressure (!) 131/57, pulse 59, temperature 98.4 F (36.9 C), temperature source Oral, resp. rate 21, SpO2 95 %.   GENERAL: Patient is afebrile, Vital signs reviewed, Well appearing, Patient appears comfortable, Alert and lucid. EYES: Normal inspection. HEENT:  normocephalic, atraumatic.  NECK:  supple , normal inspection. LAD at clavicle at 45 degrees CARD:  regular rate and rhythm with occasional ectopy, heart sounds normal. RESP:  no respiratory distress, breath sounds normal. ABD: soft, nontender to palpation, BS present, soft MUSC:  Pain with movement of bilateral right and left shoulders. Non-tender to palpation, no pedal edema . SKIN: color normal, no rash, warm, dry . NEURO: awake & alert, lucid, no motor/sensory deficit. Gait stable. PSYCH: mood/affect normal.  Labs: Lab Results  Component Value Date   BUN 15 04/27/2020   Lab Results  Component Value Date   CREATININE 1.20 (H) 04/27/2020   Lab Results  Component Value Date   NA 140 04/27/2020   K 3.6 04/27/2020   CL 104 04/27/2020   CO2 26 04/27/2020   Lab Results  Component Value Date   TROPONINI <0.03 03/19/2018   Lab Results  Component Value Date   WBC 6.2 04/27/2020   HGB 11.7 (L) 04/27/2020   HCT 36.1 04/27/2020   MCV 101.1 (H) 04/27/2020   PLT 168 04/27/2020   Lab Results  Component Value Date   CHOL 151 06/29/2019   HDL 52 06/29/2019   LDLCALC 86 06/29/2019   TRIG 66 06/29/2019   CHOLHDL 2.9 06/29/2019   Lab Results  Component Value Date   ALT 13 (L) 03/10/2017   AST 19 03/10/2017   ALKPHOS 102 03/10/2017   BILITOT 0.5 03/10/2017      Radiology:  CXR: IMPRESSION: No active cardiopulmonary disease.  No evidence of pneumonia or pulmonary edema.  EKG: Dual AV paced rhythm at 60 bpm   LHC (03/22/2018)  LM lesion is 35% stenosed.  Prox LAD lesion is 100% stenosed.  Prox Cx to Mid Cx lesion is 30% stenosed.  Ost 2nd Mrg to 2nd Mrg lesion is 100% stenosed.  Ost 1st Mrg to 1st Mrg lesion is 100% stenosed.  Ost RCA to Prox RCA lesion is 100% stenosed.  LIMA and is normal in caliber.  SVG and is normal in caliber.  SVG due to known occlusion.  Origin to Prox Graft lesion is 100% stenosed.  Mid LM lesion is 50% stenosed.   1. Severe native vessel CAD with total occlusion of the LAD, RCA, and OM branch of the circumflex 2. S/p CABG With continued patency of the SVG-OM and LIMA-LAD 3. Moderate left main stenosis and mild LCx in-stent restenosis   Echo (03/20/2018) Study Conclusions   - Left ventricle: The cavity size was normal. There was moderate  concentric hypertrophy. Systolic function was at the lower limits  of normal. The estimated ejection fraction was in the range of  50% to 55%. Hypokinesis of the basalinferolateral myocardium.  Doppler parameters are consistent with abnormal left ventricular  relaxation (grade 1 diastolic dysfunction). Indeterminate mean  left atrial filling pressure.  - Ventricular septum: Septal motion showed abnormal function,  dyssynergy, and paradox. These changes are consistent with right  ventricular pacing.  - Aortic valve: There was mild regurgitation.  - Mitral valve: There was mild regurgitation.    ASSESSMENT AND PLAN:  Christine Gay is a 79 year old woman with CAD s/p remote CABG (1992) and PCI, DMII (A1c 5.6% in 2019),  HTN, paroxysmal atrial fibrillation (on warfarin), HFpEF, CHB s/p St. Jude dc-PPM, hypothyroidism, and seizure disorder who presents for evaluation of chest pressure and was subsequently diagnosed with an NSTEMI.  Cardiology is consulted for assistance with evaluation and management.  Last LHC in 2019  showed severe native vessel CAD s/p bypass with continued patency of the vein graft to the OM and LIMA-LAD. There was moderate LM stenosis and mild LCx in-stent restenosis, either of which could have progressed to become hemodynamically significant since then.    # NSTEMI # CAD s/p CABG and remote PCI # DMII # CHB s/p PPM # HTN # pAF # HFpEF # Hypothyroidism # History of Seizure Disorder  - Admit to Medicine; appreciate assistance with this medically complex patient - Plan for diagnostic LHC in AM  - Patient should remain NPO except for meds - Start heparin IV infusion protocol - Monitor on telemetry - Symptom management with SLNTG - Trend trop and serial ECGs - TTE in AM - Aggressive risk factor modification  - Cont home regimen of carvedilol, rosuvastatin, Imdur and Zetia - Hold Lasix and warfarin overnight (INR pending)      Signed: Clois Dupes 04/28/2020, 2:52 AM

## 2020-04-28 NOTE — Interval H&P Note (Signed)
History and Physical Interval Note:  04/28/2020 2:44 PM  Christine Gay  has presented today for surgery, with the diagnosis of nstemi.  The various methods of treatment have been discussed with the patient and family. After consideration of risks, benefits and other options for treatment, the patient has consented to  Procedure(s): LEFT HEART CATH AND CORS/GRAFTS ANGIOGRAPHY (N/A) as a surgical intervention.  The patient's history has been reviewed, patient examined, no change in status, stable for surgery.  I have reviewed the patient's chart and labs.  Questions were answered to the patient's satisfaction.     Takeila Thayne [employer]

## 2020-04-28 NOTE — ED Notes (Signed)
Cards updated on new s/s, pt will have cardiac cath today

## 2020-04-28 NOTE — Progress Notes (Signed)
Patient admitted after midnight but care began prior to midnight.  Please see H&P by Dr. Cyd Silence.     NSTEMI (non-ST elevated myocardial infarction) [facility])  Patient presenting with several episodes of substantial chest discomfort in the past 24 hours, with typical features concerning for acute coronary syndrome.  Serial cardiac enzymes reveal a dramatic elevation in troponin, initially at 62 with second troponin being 2371 seeming to confirm a NSTEMI in this patient.  Patient is n.p.o. for  cardiac catheterization l  Admitting patient to stepdown unit for continued close monitoring.  Abdominal pain  CT scan shows: Caliber change in the colon at the splenic flexure with mild surrounding stranding. Findings could reflect colitis related to watershed ischemia given patient history. Consider close attention on follow-up and correlation with lactate  GI Consult appreciated: most likely ischemic colitis and to proceed with cath    Hypothyroidism  Continue home regimen of Synthroid  TSH actually may need to consider increase    Chronic diastolic CHF (congestive heart failure) (HCC)  No evidence of cardiogenic volume overload  Per recommendation of cardiology, overnight and early morning doses of Lasix have been held.      Type 2 diabetes mellitus without complication, without long-term current use of insulin (HCC)  Accu-Cheks every 6 hours while n.p.o. with sliding scale insulin  Hemoglobin A1c: 5.7  Holding home regimen of oral hypoglycemics    AF (paroxysmal atrial fibrillation) (HCC)  Monitoring patient on telemetry  Temporarily holding warfarin as we place the patient on a heparin infusion in preparation for cardiac catheterization per cardiology's recommendations.    GERD (gastroesophageal reflux disease)  Protonix 40 mg IV every 24 hours    Mixed diabetic hyperlipidemia associated with type 2 diabetes mellitus ([place])  Continue on Crestor and Zetia  LDL  [place] DO

## 2020-04-28 NOTE — H&P (Signed)
History and Physical    Christine Gay MRN:[id number] DOB: 02/15/1941 DOA: 04/27/2020  PCP: Polite, Ronald, MD  Patient coming from:    Chief Complaint:  Chief Complaint  Patient presents with  . Chest Pain     HPI:    79-year-old female with past medical history of coronary artery disease status post CABG (S/P multiple interventions,last cardiac cath 02/2018), hyperlipidemia, hypertension, paroxysmal atrial fibrillation, complete heart block status post pacemaker placement, diastolic congestive heart failure (Echo 02/2018 with EF 50-55% and diastolic dysfunction) who presents to [facility] emergency department complaints of chest discomfort.  Patient explains that at approximately 2:30 in the morning on 8/1 she was awoken from sleep with chest discomfort.  Chest discomfort was midsternal in location, pressure-like in quality, severe in intensity and radiating to the neck.  This lasted for at least a half an hour and eventually resolved with administration of nitroglycerin.  Patient was able to go back to sleep.  In the late morning, while the patient was preparing lunch in the kitchen she began to develop yet another episode of chest discomfort similar to the prior episode which lasted for several minutes.  Patient again took sublingual nitroglycerin which improved her symptoms somewhat but unfortunately they did not resolve.  At this point the patient called EMS which brought the patient into [facility] emergency department for evaluation.  In route to the emergency department patient was administered 325 mg of aspirin.  Upon patient's lengthy evaluation in the emergency department patient states that she has experienced additional milder episodes of chest discomfort similar in quality to the previous episodes.  Patient underwent multiple cardiac enzymes with the first troponin being 62 and second troponin being 2379 concerning for developing NSTEMI.  At this point, the  emergency department provider called Dr. Ugowe with cardiology who came and evaluated the patient at the bedside.  It was recommended that the patient be placed on a heparin infusion and made n.p.o. for likely cardiac catheterization later this morning.  Hospitalist group was then called to assess patient for admission the hospital.  Review of Systems: A 10-system review of systems has been performed and all systems are negative with the exception of what is listed in the HPI.    Past Medical History:  Diagnosis Date  . Anemia    a. Noted on labs 04/2013.  . Anginal pain (HCC)   . Arthritis    "legs" (06/10/2014)  . CAD (coronary artery disease)    a. s/p CABG 1992, b. s/p PCI several years ago; c. LHC 11/08: L-LAD ok, S-OM1 ok, S-OM2 CTO, prox to mid CFX 70% (unchanged), RCA non-dominant, occluded;  d.  Echo 2/11: EF 55%, mod LVH, Ao sclerosis;  e. Echo 6/14: mild LVH, mild focal basal sept hypertrophy, EF 55-65%, inf HK, Mild AI, mild MR, mild BAE;  f. Myoview 7/14: int risk-> cath 04/2013 s/p PTCA/DES to prox Cx 05/04/13.  . Chronic diastolic CHF (congestive heart failure) (HCC)   . GERD (gastroesophageal reflux disease)   . Glaucoma   . High cholesterol   . Hypertension   . Hypokalemia 02/2017  . Hypothyroidism   . LBBB (left bundle branch block)    chronic  . Myocardial infarction (HCC) 03/1991 X 2; 07/1991  . Obesity   . Seizures (HCC)    "I'm on Dilantin" (06/10/2014)  . Sickle cell trait (HCC)   . Type II diabetes mellitus (HCC)     Past Surgical History:  Procedure Laterality   Date  . CARDIAC CATHETERIZATION     "several;  they went up to check"  . CARDIAC CATHETERIZATION N/A 09/28/2016   Procedure: Left Heart Cath and Coronary Angiography;  Surgeon: Peter M Jordan, MD;  Location: [facility] INVASIVE CV LAB;  Service: Cardiovascular;  Laterality: N/A;  . CATARACT EXTRACTION W/ INTRAOCULAR LENS  IMPLANT, BILATERAL Bilateral   . CHOLECYSTECTOMY    . COLONOSCOPY N/A 06/12/2014   Procedure:  COLONOSCOPY;  Surgeon: Robert Buccini V, MD;  Location: [facility] ENDOSCOPY;  Service: Endoscopy;  Laterality: N/A;  . CORONARY ANGIOPLASTY WITH STENT PLACEMENT  ? date; 05/04/2013   ? location; DES  to CIRCUMFLEX  . CORONARY ARTERY BYPASS GRAFT  07/1991   "CABG X 3"  . EP IMPLANTABLE DEVICE N/A 09/29/2016   Procedure: Pacemaker Implant;  Surgeon: Will Martin Camnitz, MD;  Location: [facility] INVASIVE CV LAB;  Service: Cardiovascular;  Laterality: N/A;  . ESOPHAGOGASTRODUODENOSCOPY N/A 06/12/2014   Procedure: ESOPHAGOGASTRODUODENOSCOPY (EGD);  Surgeon: Robert Buccini V, MD;  Location: [facility] ENDOSCOPY;  Service: Endoscopy;  Laterality: N/A;  . LEFT AND RIGHT HEART CATHETERIZATION WITH CORONARY ANGIOGRAM N/A 05/04/2013   Procedure: LEFT AND RIGHT HEART CATHETERIZATION WITH CORONARY ANGIOGRAM;  Surgeon: Dalton S McLean, MD;  Location: [facility] CATH LAB;  Service: Cardiovascular;  Laterality: N/A;  . LEFT HEART CATH AND CORS/GRAFTS ANGIOGRAPHY N/A 03/22/2018   Procedure: LEFT HEART CATH AND CORS/GRAFTS ANGIOGRAPHY;  Surgeon: Cooper, Michael, MD;  Location: [facility] INVASIVE CV LAB;  Service: Cardiovascular;  Laterality: N/A;  . PERCUTANEOUS STENT INTERVENTION  05/04/2013   Procedure: PERCUTANEOUS STENT INTERVENTION;  Surgeon: Dalton S McLean, MD;  Location: [facility] CATH LAB;  Service: Cardiovascular;;  . TUBAL LIGATION Bilateral 1969     reports that she has never smoked. She has never used smokeless tobacco. She reports that she does not drink alcohol and does not use drugs.  Allergies  Allergen Reactions  . Meperidine Hcl Other (See Comments)    Demerol - mental status changes    . Nitroglycerin Other (See Comments)    Patch and Cream only - blisters  . Morphine Palpitations    Family History  Problem Relation Age of Onset  . Diabetes Mother      Prior to Admission medications   Medication Sig Start Date End Date Taking? Authorizing Provider  acetaminophen (TYLENOL) 325 MG tablet Take 1-2 tablets (325-650 mg total) by  mouth every 4 (four) hours as needed for mild pain. 09/30/16  Yes Tillery, Michael Andrew, PA-C  CALCIUM PO Take 1 tablet by mouth daily.   Yes [provider]  carvedilol (COREG) 6.25 MG tablet TAKE 1/2 TABLET BY MOUTH 2 TIMES A DAY WITH A MEAL. Patient taking differently: Take 3.125 mg by mouth 2 (two) times daily with a meal.  03/19/19  Yes McLean, Dalton S, MD  cycloSPORINE (RESTASIS) 0.05 % ophthalmic emulsion Place 1 drop into both eyes 2 (two) times daily.   Yes [provider]  ezetimibe (ZETIA) 10 MG tablet TAKE 1 TABLET BY MOUTH DAILY. Patient taking differently: Take 10 mg by mouth every evening.  01/21/20  Yes McLean, Dalton S, MD  ferrous sulfate 325 (65 FE) MG tablet Take 325 mg by mouth daily with breakfast.   Yes [provider]  furosemide (LASIX) 40 MG tablet TAKE 1 AND 1/2 TABLETS BY MOUTH 2 TIMES DAILY. Patient taking differently: Take 60 mg by mouth 2 (two) times daily.  04/10/20  Yes Bensimhon, Daniel R, MD  ibandronate (BONIVA) 150 MG tablet Take 150   mg by mouth every 30 (thirty) days. Take in the morning with a full glass of water, on an empty stomach, and do not take anything else by mouth or lie down for the next 30 min.   Yes [provider]  isosorbide mononitrate (IMDUR) 60 MG 24 hr tablet Take 1 tablet (60 mg total) by mouth 2 (two) times daily. 11/14/19  Yes McLean, Dalton S, MD  levothyroxine (SYNTHROID, LEVOTHROID) 100 MCG tablet Take 100 mcg by mouth daily before breakfast.   Yes [provider]  metFORMIN (GLUCOPHAGE-XR) 500 MG 24 hr tablet Take 500 mg by mouth 2 (two) times daily.  05/08/13  Yes Dunn, Dayna N, PA-C  Multiple Vitamin (MULTIVITAMIN WITH MINERALS) TABS tablet Take 1 tablet by mouth daily.   Yes [provider]  nitroGLYCERIN (NITROSTAT) 0.4 MG SL tablet PLACE 1 TABLET (0.4 MG TOTAL) UNDER THE TONGUE EVERY 5 (FIVE) MINUTES AS NEEDED FOR CHEST PAIN. Patient taking differently: Place 0.4 mg under the tongue  every 5 (five) minutes as needed for chest pain.  05/12/18  Yes McLean, Dalton S, MD  pantoprazole (PROTONIX) 40 MG tablet TAKE 1 TABLET BY MOUTH DAILY. Patient taking differently: Take 40 mg by mouth daily.  05/09/19  Yes McLean, Dalton S, MD  phenytoin (DILANTIN) 100 MG ER capsule Take 100-200 mg by mouth See admin instructions. 200 mg in the morning and 100 mg in the evening   Yes [provider]  potassium chloride SA (KLOR-CON) 20 MEQ tablet TAKE 1 TABLET BY MOUTH 2 TIMES DAILY. Patient taking differently: Take 20 mEq by mouth 2 (two) times daily.  12/18/19  Yes McLean, Dalton S, MD  rosuvastatin (CRESTOR) 40 MG tablet Take 1 tablet (40 mg total) by mouth daily. Patient taking differently: Take 40 mg by mouth every evening.  05/03/19  Yes McLean, Dalton S, MD  timolol (TIMOPTIC) 0.5 % ophthalmic solution Place 1 drop into both eyes 2 (two) times daily.  09/21/16  Yes [provider]  warfarin (COUMADIN) 5 MG tablet Take 1-1.5 tablets daily or as directed by Anticoagulation Clinic. Patient taking differently: Take 5-7.5 mg by mouth See admin instructions. Take 1 tablet (5mg) by mouth once daily on Mon, Wed, Fri, and 1 1/2 tablet (7.5mg) all other days Tues, Thurs, Sat, and Sun. 04/10/20  Yes McLean, Dalton S, MD  Accu-Chek Softclix Lancets lancets  07/16/19   [provider]  Blood Glucose Monitoring Suppl (ACCU-CHEK AVIVA PLUS) w/Device KIT  07/16/19   [provider]  diclofenac (FLECTOR) 1.3 % PTCH Place 1 patch onto the skin 2 (two) times daily. Patient not taking: Reported on 04/28/2020 04/04/19   Lockwood, Robert, MD  enoxaparin (LOVENOX) 120 MG/0.8ML injection Inject 0.8 mLs (120 mg total) into the skin daily. Patient not taking: Reported on 04/28/2020 12/07/18   McLean, Dalton S, MD  glucose blood test strip Test blood glucose three times daily. 06/30/17   McLean, Dalton S, MD    Physical Exam: Vitals:   04/27/20 2224 04/28/20 0030 04/28/20 0116 04/28/20 0315    BP: (!) 131/69 (!) 131/72 (!) 131/57 (!) 136/56  Pulse: 63 60 59 77  Resp: 18 (!) 26 21 16  Temp:      TempSrc:      SpO2: 97% 100% 95% 96%    Constitutional: Acute alert and oriented x3, no associated distress.   Skin: no rashes, no lesions, good skin turgor noted. Eyes: Pupils are equally reactive to light.  No evidence of scleral   icterus or conjunctival pallor.  ENMT: Moist mucous membranes noted.  Posterior pharynx clear of any exudate or lesions.   Neck: normal, supple, no masses, no thyromegaly.  No evidence of jugular venous distension.   Respiratory: clear to auscultation bilaterally, no wheezing, no crackles. Normal respiratory effort. No accessory muscle use.  Cardiovascular: Regular rate and rhythm, no murmurs / rubs / gallops. No extremity edema. 2+ pedal pulses. No carotid bruits.  Chest:   Very mild anterior chest wall tenderness without evidence of crepitus or deformity. Back:   Nontender without crepitus or deformity. Abdomen: Abdomen is soft and nontender.  No evidence of intra-abdominal masses.  Positive bowel sounds noted in all quadrants.   Musculoskeletal: No joint deformity upper and lower extremities. Good ROM, no contractures. Normal muscle tone.  Neurologic: CN 2-12 grossly intact. Sensation intact, strength noted to be 5 out of 5 in all 4 extremities.  Patient is following all commands.  Patient is responsive to verbal stimuli.   Psychiatric: Patient presents as a normal mood with appropriate affect.  Patient seems to possess insight as to theircurrent situation.     Labs on Admission: I have personally reviewed following labs and imaging studies -   CBC: Recent Labs  Lab 04/27/20 1705  WBC 6.2  HGB 11.7*  HCT 36.1  MCV 101.1*  PLT 168   Basic Metabolic Panel: Recent Labs  Lab 04/27/20 1705  NA 140  K 3.6  CL 104  CO2 26  GLUCOSE 112*  BUN 15  CREATININE 1.20*  CALCIUM 8.6*   GFR: Estimated Creatinine Clearance: 35.5 mL/min (A) (by C-G  formula based on SCr of 1.2 mg/dL (H)). Liver Function Tests: No results for input(s): AST, ALT, ALKPHOS, BILITOT, PROT, ALBUMIN in the last 168 hours. No results for input(s): LIPASE, AMYLASE in the last 168 hours. No results for input(s): AMMONIA in the last 168 hours. Coagulation Profile: No results for input(s): INR, PROTIME in the last 168 hours. Cardiac Enzymes: No results for input(s): CKTOTAL, CKMB, CKMBINDEX, TROPONINI in the last 168 hours. BNP (last 3 results) No results for input(s): PROBNP in the last 8760 hours. HbA1C: No results for input(s): HGBA1C in the last 72 hours. CBG: No results for input(s): GLUCAP in the last 168 hours. Lipid Profile: No results for input(s): CHOL, HDL, LDLCALC, TRIG, CHOLHDL, LDLDIRECT in the last 72 hours. Thyroid Function Tests: No results for input(s): TSH, T4TOTAL, FREET4, T3FREE, THYROIDAB in the last 72 hours. Anemia Panel: No results for input(s): VITAMINB12, FOLATE, FERRITIN, TIBC, IRON, RETICCTPCT in the last 72 hours. Urine analysis:    Component Value Date/Time   COLORURINE YELLOW 06/10/2014 1805   APPEARANCEUR CLEAR 06/10/2014 1805   LABSPEC 1.015 06/10/2014 1805   PHURINE 5.5 06/10/2014 1805   GLUCOSEU NEGATIVE 06/10/2014 1805   HGBUR NEGATIVE 06/10/2014 1805   BILIRUBINUR NEGATIVE 06/10/2014 1805   KETONESUR NEGATIVE 06/10/2014 1805   PROTEINUR NEGATIVE 06/10/2014 1805   UROBILINOGEN 0.2 06/10/2014 1805   NITRITE NEGATIVE 06/10/2014 1805   LEUKOCYTESUR TRACE (A) 06/10/2014 1805    Radiological Exams on Admission - Personally Reviewed: DG Chest 2 View  Result Date: 04/27/2020 CLINICAL DATA:  Chest pain EXAM: CHEST - 2 VIEW COMPARISON:  Chest x-ray dated 04/04/2019 FINDINGS: Stable cardiomegaly. LEFT chest wall pacemaker/ICD hardware appears stable. Lungs are clear. No pleural effusion or pneumothorax is seen. Mild degenerative spurring in the midthoracic spine. No acute or suspicious osseous finding. Aortic  atherosclerosis 2 IMPRESSION: No active cardiopulmonary disease. No evidence of   pneumonia or pulmonary edema. Electronically Signed   By: Franki Cabot M.D.   On: 04/27/2020 17:28    EKG: Personally reviewed.  Rhythm is paced rhythm with heart rate of 62 bpm.  No dynamic ST segment changes appreciated.  Assessment/Plan Principal Problem:   NSTEMI (non-ST elevated myocardial infarction) [facility])   Patient presenting with several episodes of substantial chest discomfort in the past 24 hours, with typical features concerning for acute coronary syndrome.  Serial cardiac enzymes reveal a dramatic elevation in troponin, initially at 62 with second troponin being 2371 seeming to confirm a NSTEMI in this patient.  Case has already been discussed with Dr. Vickki Muff with cardiology who has graciously come to see the patient and written a note with recommendations.  Per cardiology recommendations, patient has been placed on a heparin infusion (with concurrent withholding of Warfarin).  Patient will be provided with as needed nitroglycerin.  Patient is being continued on aspirin, statin and Zetia.  Patient is n.p.o. for possible cardiac catheterization later this morning at the recommendation of cardiology.  Admitting patient to stepdown unit for continued close monitoring.  Active Problems:   Hypothyroidism   Continue home regimen of Synthroid  TSH pending    Chronic diastolic CHF (congestive heart failure) (HCC)   No evidence of cardiogenic volume overload  Per recommendation of cardiology, overnight and early morning doses of Lasix have been held.  This medication will be resumed after cardiac catheterization.    Type 2 diabetes mellitus without complication, without long-term current use of insulin (HCC)   Accu-Cheks every 6 hours while n.p.o. with sliding scale insulin  Hemoglobin A1c pending  Holding home regimen of oral hypoglycemics    AF (paroxysmal atrial fibrillation)  ([place])   Patient currently exhibiting a paced rhythm with controlled rate  Monitoring patient on telemetry  Temporarily holding warfarin as we place the patient on a heparin infusion in preparation for cardiac catheterization per cardiology's recommendations.    GERD (gastroesophageal reflux disease)   Protonix 40 mg IV every 24 hours    Mixed diabetic hyperlipidemia associated with type 2 diabetes mellitus (HCC)  Continue on Crestor and Zetia  Lipid panel pending   Code Status:  Full code Family Communication: Deferred  Status is: Inpatient  Remains inpatient appropriate because:Ongoing diagnostic testing needed not appropriate for outpatient work up, IV treatments appropriate due to intensity of illness or inability to take PO and Inpatient level of care appropriate due to severity of illness   Dispo: The patient is from: Home              Anticipated d/c is to: Home              Anticipated d/c date is: 3 days              Patient currently is not medically stable to d/c.        Vernelle Emerald MD [facility] Pager [phone]  If 7PM-7AM, please contact night-coverage [email or web address] Use universal [place] password for that web site. If you do not have the password, please call the hospital operator.  04/28/2020, 4:25 AM

## 2020-04-28 NOTE — Progress Notes (Addendum)
Asked to evaluate due to abdominal pain. Presented with chest pain.  HS trop 16>1096 >2363  She was just evaluated by Rosaria Ferries PA-C---> CT abd/pelvis ordered.  No chest pain. Having abdominal discomfort with diffuse tenderness. She says she thinks its because she is hungry. Last BM 8/1 and she denies BRBPR. Says bowel movement was brown.   VSS. Volume status stable.  INR 1.6. Missed coumadin dose 8/1.  Hgb stable.   CT Abd/pelvis completed with results pending. Switched to IV protonix this morning.   No additional orders. Will follow up with Dr Aundra Dubin.  Possible cath later today.  Darrick Grinder NP-C  8:41 AM   Dr Aundra Dubin reviewed CT abd/pelvis. Concern for colitis splenic flexure. GI consulted [facility])  per Dr Aundra Dubin. She needs LHC but will need CT  reviewed by GI first. Lactate ordered.    Marvel Sapp NP-C  10:12 AM ' Advanced Heart Failure Team Pager [phone] (M-F; [place])  Please contact [facility] for night-coverage after hours (4p -7a ) and weekends on [email or web address]

## 2020-04-28 NOTE — Consult Note (Signed)
Referring Provider:  Dr. [name] Primary Care Physician:  Seward Carol, MD Primary Gastroenterologist:  Dr. Cristina Gong [facility])  Reason for Consultation:  Abnormal CT  HPI: Christine Gay is a 79 y.o. female with history of CAD s/p CABG, HTN, A fib (on Coumadin), complete heart block s/p pacemaker, and CHF presenting for consultation of abnormal abdominal CT.  CT today showed: Caliber change in the colon at the splenic flexure with mild surrounding stranding. Findings could reflect colitis related to watershed ischemia given patient history. Consider close attention on follow-up and correlation with lactate. No masslike features.  Patient reports diffuse abdominal pain today.  Has soft stools in general, on average 3/day, but denies liquid stools.  Denies any hematochezia or melena.  Had an episode of emesis earlier today after GI cocktail.  Endorses chronic GERD for which she takes Protonix.  Denies any recent changes in appetite or unexplained weight loss.  Has occasional dysphagia to solids which has been ongoing.  Denies any family history of colon cancer or gastrointestinal malignancy.  Colonoscopy 06/12/2014: Small colon polyp removed (bx: tubular adenoma); no repeat recommended due to age/comorbidities  EGD 06/12/2014:  Polypoid gastric lesion with friability; proximal esophageal ring, widely patent, not felt likely to be a cervical esophageal web as in Plummer-Vinson syndrome. Otherwise normal endoscopic evaluation   Past Medical History:  Diagnosis Date  . Anemia    a. Noted on labs 04/2013.  . Anginal pain ([place])   . Arthritis    "legs" (06/10/2014)  . CAD (coronary artery disease)    a. s/p CABG 1992, b. s/p PCI several years ago; c. LHC 11/08: L-LAD ok, S-OM1 ok, S-OM2 CTO, prox to mid CFX 70% (unchanged), RCA non-dominant, occluded;  d.  Echo 2/11: EF 55%, mod LVH, Ao sclerosis;  e. Echo 6/14: mild LVH, mild focal basal sept hypertrophy, EF 55-65%, inf HK, Mild AI, mild  MR, mild BAE;  f. Myoview 7/14: int risk-> cath 04/2013 s/p PTCA/DES to prox Cx 05/04/13.  . Chronic diastolic CHF (congestive heart failure) ([place])   . GERD (gastroesophageal reflux disease)   . Glaucoma   . High cholesterol   . Hypertension   . Hypokalemia 02/2017  . Hypothyroidism   . LBBB (left bundle branch block)    chronic  . Myocardial infarction ([place]) 03/1991 X 2; 07/1991  . Obesity   . Seizures ([place])    "I'm on Dilantin" (06/10/2014)  . Sickle cell trait ([place])   . Type II diabetes mellitus ([place])     Past Surgical History:  Procedure Laterality Date  . CARDIAC CATHETERIZATION     "several;  they went up to check"  . CARDIAC CATHETERIZATION N/A 09/28/2016   Procedure: Left Heart Cath and Coronary Angiography;  Surgeon: Peter M Martinique, MD;  Location: [place] [facility] LAB;  Service: Cardiovascular;  Laterality: N/A;  . CATARACT EXTRACTION W/ INTRAOCULAR LENS  IMPLANT, BILATERAL Bilateral   . CHOLECYSTECTOMY    . COLONOSCOPY N/A 06/12/2014   Procedure: COLONOSCOPY;  Surgeon: Cleotis Nipper, MD;  Location[facility] ENDOSCOPY;  Service: Endoscopy;  Laterality: N/A;  . CORONARY ANGIOPLASTY WITH STENT PLACEMENT  ? date; 05/04/2013   ? location; DES  to [place]  . CORONARY ARTERY BYPASS GRAFT  07/1991   "CABG X 3"  . EP IMPLANTABLE DEVICE N/A 09/29/2016   Procedure: Pacemaker Implant;  Surgeon: Will Meredith Leeds, MD;  Location: [place] [facility];  Service: Cardiovascular;  Laterality: N/A;  . ESOPHAGOGASTRODUODENOSCOPY N/A 06/12/2014  Procedure: ESOPHAGOGASTRODUODENOSCOPY (EGD);  Surgeon: Cleotis Nipper, MD;  Location: [facility] ENDOSCOPY;  Service: Endoscopy;  Laterality: N/A;  . LEFT AND RIGHT HEART CATHETERIZATION WITH CORONARY ANGIOGRAM N/A 05/04/2013   Procedure: LEFT AND RIGHT HEART CATHETERIZATION WITH CORONARY ANGIOGRAM;  Surgeon: Larey Dresser, MD;  Location: [facility] CATH LAB;  Service: Cardiovascular;  Laterality: N/A;  . LEFT HEART CATH AND CORS/GRAFTS ANGIOGRAPHY N/A 03/22/2018    Procedure: LEFT HEART CATH AND CORS/GRAFTS ANGIOGRAPHY;  Surgeon: Sherren Mocha, MD;  Location: [place] [facility];  Service: Cardiovascular;  Laterality: N/A;  . PERCUTANEOUS STENT INTERVENTION  05/04/2013   Procedure: PERCUTANEOUS STENT INTERVENTION;  Surgeon: Larey Dresser, MD;  Location: [facility] CATH LAB;  Service: Cardiovascular;;  . TUBAL LIGATION Bilateral 1969    Prior to Admission medications   Medication Sig Start Date End Date Taking? Authorizing Provider  acetaminophen (TYLENOL) 325 MG tablet Take 1-2 tablets (325-650 mg total) by mouth every 4 (four) hours as needed for mild pain. 09/30/16  Yes Shirley Friar, PA-C  CALCIUM PO Take 1 tablet by mouth daily.   Yes [provider]  carvedilol (COREG) 6.25 MG tablet TAKE 1/2 TABLET BY MOUTH 2 TIMES A DAY WITH A MEAL. Patient taking differently: Take 3.125 mg by mouth 2 (two) times daily with a meal.  03/19/19  Yes Larey Dresser, MD  cycloSPORINE (RESTASIS) 0.05 % ophthalmic emulsion Place 1 drop into both eyes 2 (two) times daily.   Yes [provider]  ezetimibe (ZETIA) 10 MG tablet TAKE 1 TABLET BY MOUTH DAILY. Patient taking differently: Take 10 mg by mouth every evening.  01/21/20  Yes Larey Dresser, MD  ferrous sulfate 325 (65 FE) MG tablet Take 325 mg by mouth daily with breakfast.   Yes [provider]  furosemide (LASIX) 40 MG tablet TAKE 1 AND 1/2 TABLETS BY MOUTH 2 TIMES DAILY. Patient taking differently: Take 60 mg by mouth 2 (two) times daily.  04/10/20  Yes Bensimhon, Shaune Pascal, MD  ibandronate (BONIVA) 150 MG tablet Take 150 mg by mouth every 30 (thirty) days. Take in the morning with a full glass of water, on an empty stomach, and do not take anything else by mouth or lie down for the next 30 min.   Yes [provider]  isosorbide mononitrate (IMDUR) 60 MG 24 hr tablet Take 1 tablet (60 mg total) by mouth 2 (two) times daily. 11/14/19  Yes Larey Dresser, MD  levothyroxine  (SYNTHROID, LEVOTHROID) 100 MCG tablet Take 100 mcg by mouth daily before breakfast.   Yes [provider]  metFORMIN (GLUCOPHAGE-XR) 500 MG 24 hr tablet Take 500 mg by mouth 2 (two) times daily.  05/08/13  Yes Dunn, Dayna N, PA-C  Multiple Vitamin (MULTIVITAMIN WITH MINERALS) TABS tablet Take 1 tablet by mouth daily.   Yes [provider]  nitroGLYCERIN (NITROSTAT) 0.4 MG SL tablet PLACE 1 TABLET (0.4 MG TOTAL) UNDER THE TONGUE EVERY 5 (FIVE) MINUTES AS NEEDED FOR CHEST PAIN. Patient taking differently: Place 0.4 mg under the tongue every 5 (five) minutes as needed for chest pain.  05/12/18  Yes Larey Dresser, MD  pantoprazole (PROTONIX) 40 MG tablet TAKE 1 TABLET BY MOUTH DAILY. Patient taking differently: Take 40 mg by mouth daily.  05/09/19  Yes Larey Dresser, MD  phenytoin (DILANTIN) 100 MG ER capsule Take 100-200 mg by mouth See admin instructions. 200 mg in the morning and 100 mg in the evening   Yes [provider]  potassium chloride SA (KLOR-CON) 20 MEQ tablet TAKE 1 TABLET BY MOUTH 2 TIMES DAILY. Patient taking differently: Take 20 mEq by mouth 2 (two) times daily.  12/18/19  Yes Larey Dresser, MD  rosuvastatin (CRESTOR) 40 MG tablet Take 1 tablet (40 mg total) by mouth daily. Patient taking differently: Take 40 mg by mouth every evening.  05/03/19  Yes Larey Dresser, MD  timolol (TIMOPTIC) 0.5 % ophthalmic solution Place 1 drop into both eyes 2 (two) times daily.  09/21/16  Yes [provider]  warfarin (COUMADIN) 5 MG tablet Take 1-1.5 tablets daily or as directed by Anticoagulation Clinic. Patient taking differently: Take 5-7.5 mg by mouth See admin instructions. Take 1 tablet (71m) by mouth once daily on Mon, Wed, Fri, and 1 1/2 tablet (7.520m all other days Tues, Thurs, Sat, and Sun. 04/10/20  Yes McLarey DresserMD  Accu-Chek Softclix Lancets lancets  07/16/19   [provider]  Blood Glucose Monitoring Suppl (ACCU-CHEK AVIVA PLUS)  w/Device KIT  07/16/19   [provider]  diclofenac (FLECTOR) 1.3 % PTCH Place 1 patch onto the skin 2 (two) times daily. Patient not taking: Reported on 04/28/2020 04/04/19   LoCarmin MuskratMD  enoxaparin (LOVENOX) 120 MG/0.8ML injection Inject 0.8 mLs (120 mg total) into the skin daily. Patient not taking: Reported on 04/28/2020 12/07/18   McLarey DresserMD  glucose blood test strip Test blood glucose three times daily. 06/30/17   McLarey DresserMD    Scheduled Meds: . [START ON 04/29/2020] aspirin EC  325 mg Oral Daily  . carvedilol  3.125 mg Oral BID WC  . cycloSPORINE  1 drop Both Eyes BID  . ezetimibe  10 mg Oral QPM  . furosemide  60 mg Oral BID  . insulin aspart  0-15 Units Subcutaneous Q6H  . isosorbide mononitrate  60 mg Oral BID  . levothyroxine  100 mcg Oral QAC breakfast  . pantoprazole (PROTONIX) IV  40 mg Intravenous Daily  . phenytoin  200 mg Oral Daily   And  . phenytoin  100 mg Oral QHS  . rosuvastatin  40 mg Oral QPM  . sodium chloride flush  3 mL Intravenous Once  . timolol  1 drop Both Eyes BID   Continuous Infusions: . heparin 850 Units/hr (04/28/20 069628 . nitroGLYCERIN Stopped (04/28/20 0639)   PRN Meds:.acetaminophen, nitroGLYCERIN, ondansetron (ZOFRAN) IV  Allergies as of 04/27/2020 - Review Complete 04/27/2020  Allergen Reaction Noted  . Meperidine hcl Other (See Comments) 03/20/2009  . Nitroglycerin Other (See Comments) 03/20/2012  . Morphine Palpitations 03/20/2009    Family History  Problem Relation Age of Onset  . Diabetes Mother     Social History   Socioeconomic History  . Marital status: Married    Spouse name: Not on file  . Number of children: Not on file  . Years of education: Not on file  . Highest education level: Not on file  Occupational History  . Not on file  Tobacco Use  . Smoking status: Never Smoker  . Smokeless tobacco: Never Used  Vaping Use  . Vaping Use: Never used  Substance and Sexual Activity  .  Alcohol use: No  . Drug use: No  . Sexual activity: Never  Other Topics Concern  . Not on file  Social History Narrative  . Not on file   Social Determinants of Health   Financial Resource Strain:   . Difficulty of Paying Living  Expenses:   Food Insecurity:   . Worried About Charity fundraiser in the Last Year:   . Arboriculturist in the Last Year:   Transportation Needs:   . Film/video editor (Medical):   Marland Kitchen Lack of Transportation (Non-Medical):   Physical Activity:   . Days of Exercise per Week:   . Minutes of Exercise per Session:   Stress:   . Feeling of Stress :   Social Connections:   . Frequency of Communication with Friends and Family:   . Frequency of Social Gatherings with Friends and Family:   . Attends Religious Services:   . Active Member of Clubs or Organizations:   . Attends Archivist Meetings:   Marland Kitchen Marital Status:   Intimate Partner Violence:   . Fear of Current or Ex-Partner:   . Emotionally Abused:   Marland Kitchen Physically Abused:   . Sexually Abused:     Review of Systems: Review of Systems  Constitutional: Negative for chills, fever and weight loss.  HENT: Negative for hearing loss and tinnitus.   Eyes: Negative for pain and redness.  Respiratory: Negative for cough and sputum production.   Cardiovascular: Positive for chest pain. Negative for palpitations.  Gastrointestinal: Positive for abdominal pain, heartburn and vomiting. Negative for blood in stool, constipation, diarrhea, melena and nausea.  Genitourinary: Negative for flank pain.  Musculoskeletal: Negative for falls and joint pain.  Skin: Negative for itching and rash.  Neurological: Negative for seizures and loss of consciousness.  Endo/Heme/Allergies: Negative for polydipsia. Bruises/bleeds easily.  Psychiatric/Behavioral: Negative for substance abuse. The patient is not nervous/anxious.     Physical Exam: Vital signs: Vitals:   04/28/20 0658 04/28/20 1002  BP: (!) 163/69 (!)  153/64  Pulse: 82 60  Resp: 19 21  Temp:    SpO2: 96% 96%     Physical Exam Constitutional:      General: She is not in acute distress.    Appearance: She is well-developed. She is obese.  HENT:     Head: Normocephalic and atraumatic.     Nose: Nose normal.     Mouth/Throat:     Mouth: Mucous membranes are moist.     Pharynx: Oropharynx is clear.  Eyes:     General: No scleral icterus.    Extraocular Movements: Extraocular movements intact.     Conjunctiva/sclera: Conjunctivae normal.  Cardiovascular:     Rate and Rhythm: Normal rate. Rhythm irregular.     Pulses: Normal pulses.     Heart sounds: Normal heart sounds.  Pulmonary:     Effort: Pulmonary effort is normal. No respiratory distress.     Breath sounds: Normal breath sounds.  Abdominal:     General: Bowel sounds are normal. There is distension (mild).     Palpations: There is no mass.     Tenderness: There is no abdominal tenderness. There is no guarding or rebound.     Hernia: No hernia is present.  Musculoskeletal:        General: No tenderness.     Cervical back: Normal range of motion and neck supple.     Right lower leg: No edema.     Left lower leg: No edema.  Skin:    General: Skin is warm and dry.  Neurological:     General: No focal deficit present.     Mental Status: She is alert and oriented to person, place, and time.  Psychiatric:  Mood and Affect: Mood normal.        Behavior: Behavior normal.     GI:  Lab Results: Recent Labs    04/27/20 1705 04/28/20 0728  WBC 6.2  --   HGB 11.7* 11.7*  HCT 36.1 35.3*  PLT 168  --    BMET Recent Labs    04/27/20 1705 04/28/20 0525  NA 140 140  K 3.6 3.5  CL 104 104  CO2 26 27  GLUCOSE 112* 89  BUN 15 14  CREATININE 1.20* 1.11*  CALCIUM 8.6* 8.5*   LFT Recent Labs    04/28/20 0525  PROT 6.7  ALBUMIN 3.4*  AST 44*  ALT 26  ALKPHOS 60  BILITOT 0.6   PT/INR Recent Labs    04/28/20 0529  LABPROT 18.0*  INR 1.6*      Studies/Results: CT ABDOMEN PELVIS WO CONTRAST  Result Date: 04/28/2020 CLINICAL DATA:  Abdominal pain, acute abdominal pain EXAM: CT ABDOMEN AND PELVIS WITHOUT CONTRAST TECHNIQUE: Multidetector CT imaging of the abdomen and pelvis was performed following the standard protocol without IV contrast. COMPARISON:  December 19, 2010 FINDINGS: Lower chest: No consolidation. No pleural effusion. Cardiac pacer device partially imaged in an enlarged heart with areas of coronary artery calcification and potentially prior percutaneous coronary intervention. Status post median sternotomy. Hepatobiliary: No focal, suspicious hepatic lesion. Post cholecystectomy. No gross biliary duct dilation. Pancreas: Pancreas is normal without contour abnormality or gross ductal dilation. No peripancreatic inflammation. Spleen: Spleen normal in size and contour. Adrenals/Urinary Tract: Hip down adrenal glands are normal. Renal cyst on the RIGHT with enlargement measuring 4.4 x 3.4 cm showing homogeneous low-density. No nephrolithiasis. No hydronephrosis. No ureteral calculi. Stomach/Bowel: Mild distension of the stomach. No signs of small bowel obstruction or acute small bowel process. Midline position of the cecum without signs of mesenteric twist or stranding. Mild distension of the cecum to approximately 6 cm. Perhaps mild stranding at the splenic flexure with caliber change at this location. Appendix not visualized.  No secondary signs of appendicitis. Vascular/Lymphatic: Vascular structures with abundant atheromatous plaque. Tracking into branch vessels in the abdomen. No adenopathy in the retroperitoneum though there is a mildly enlarged retrocrural lymph node approximately 9 mm. Scattered celiac nodes. As compared to the previous CT of the chest from 2020 these are unchanged. No pelvic lymphadenopathy. Reproductive: Uri's is unremarkable.  No adnexal mass. Other: No free air or ascites. Musculoskeletal: Spinal degenerative  changes. Signs of prior LEFT rib fracture. No acute bone finding. IMPRESSION: 1. Caliber change in the colon at the splenic flexure with mild surrounding stranding. Findings could reflect colitis related to watershed ischemia given patient history. Consider close attention on follow-up and correlation with lactate. No masslike features at this location consider colonoscopic assessment as warranted based on follow-up evaluations. 2. Midline position of the cecum without mesenteric twist. Findings could be seen in the setting of cecal bascule or mobile cecum but are not associated with acute colonic features at this time. This could be a cause for intermittent abdominal pain. 3. Enlarging RIGHT renal cyst 4. Cardiomegaly with areas of coronary artery calcification and potentially prior percutaneous coronary intervention. 5. Aortic atherosclerosis. Aortic Atherosclerosis (ICD10-I70.0). Electronically Signed   By: Zetta Bills M.D.   On: 04/28/2020 08:35   DG Chest 2 View  Result Date: 04/27/2020 CLINICAL DATA:  Chest pain EXAM: CHEST - 2 VIEW COMPARISON:  Chest x-ray dated 04/04/2019 FINDINGS: Stable cardiomegaly. LEFT chest wall pacemaker/ICD hardware appears stable. Lungs are clear.  No pleural effusion or pneumothorax is seen. Mild degenerative spurring in the midthoracic spine. No acute or suspicious osseous finding. Aortic atherosclerosis 2 IMPRESSION: No active cardiopulmonary disease. No evidence of pneumonia or pulmonary edema. Electronically Signed   By: Franki Cabot M.D.   On: 04/27/2020 17:28    Impression: Abnormal CT: Suspect ischemic colitis -CT 04/28/2020: Caliber change in the colon at the splenic flexure with mild surrounding stranding. Findings could reflect colitis related to watershed ischemia given patient history. Consider close attention on follow-up and correlation with lactate. No masslike features. -Lactate normal (0.9) -Hemoglobin 11.7 yesterday, stable as compared to  baseline. -Mild abdominal distention but no tenderness or guarding; no acute abdomen  NSTEMI, cardiology plans for Cath later today  Plan: Patient presentation most consistent with ischemic colitis.  No further GI work-up needed at this time.  Recommend proceeding with cardiac interventions as per cardiology team recommendations.  Continue supportive care.  Continue Protonix qd for GERD.   LOS: 0 days   Salley Slaughter  PA-C 04/28/2020, 11:37 AM  Contact #  [phone]

## 2020-04-28 NOTE — ED Notes (Signed)
Pt reports pain now returned to 8/10, pain now in shoulders, arms and ear

## 2020-04-28 NOTE — ED Notes (Signed)
Emesis after drinking GI cocktail

## 2020-04-28 NOTE — H&P (View-Only) (Signed)
CARDIOLOGY CONSULT NOTE  Patient ID: Christine Gay MRN: [id number] DOB/AGE: 06-07-1941 79 y.o.  Admit date: 04/27/2020 Primary Cardiologist Dr. Aundra Dubin Chief Complaint  Chest Pressure Requesting  ED  HPI:  Christine Gay is a 79 year old woman with CAD s/p remote CABG (1992) and PCI, DMII (A1c 5.6% in 2019),  HTN, paroxysmal atrial fibrillation (on warfarin), HFpEF, CHB s/p [employer] dc-PPM, hypothyroidism, and seizure disorder who presents for evaluation of chest pressure and was subsequently diagnosed with an NSTEMI.  Cardiology is consulted for assistance with evaluation and management.  Patient was in her usual state of health until approximately 230 on 04/27/2020 when she developed dull, squeezing, pressure-like chest pain.  At worst she rates the pain as office scale, but it was intermittent/stuttering in quality.  Did not improve with position changes or worsen with deep inspiration.  She took 1 dose of her sublingual nitroglycerin, which she had not taken over 2 years, and after about 1-1.5 hours she felt well enough to go to sleep.   When she woke later in the morning of 8/1, patient reports that she felt overall well. However her pain returned around noon or so while she was making lunch.  She again took 1 dose of sublingual nitroglycerin, but this time her pain did not improve and so she called EMS for further assistance and was brought to the [facility] for evaluation.  She was given full dose aspirin via EMS and sublingual nitroglycerin which reportedly made Christine Gay' pain dramatically better.  The ED her initial vital signs showed BP 144/71, heart rate 67, satting 99% on room air, and afebrile.  For creatinine of 1.20 (was 0.96 in late 2020).  Hemoglobin was 11.7 stays (stable from prior) and there is no leukocytosis.  Troponin was 62 and this up trended to 2379 on recheck 8 hours later.  She was started on a heparin drip thereafter.  At the time of my interview, patient states that she is  comfortable and denies any active chest pain, exertional chest pressure/discomfort, dyspnea/tachypnea, paroxysmal nocturnal dyspnea/orthopnea, irregular heart beat/palpitations, presyncope/syncope, lower extremity edema, claudication, or abdominal distention. She is reporting some shoulder pain, with her right being the most problematic. She denies tobacco or illicit drug use. She has been adherent with her medication regimen.   Past Medical History:  Diagnosis Date  . Anemia    a. Noted on labs 04/2013.  . Anginal pain ([place])   . Arthritis    "legs" (06/10/2014)  . CAD (coronary artery disease)    a. s/p CABG 1992, b. s/p PCI several years ago; c. LHC 11/08: L-LAD ok, S-OM1 ok, S-OM2 CTO, prox to mid CFX 70% (unchanged), RCA non-dominant, occluded;  d.  Echo 2/11: EF 55%, mod LVH, Ao sclerosis;  e. Echo 6/14: mild LVH, mild focal basal sept hypertrophy, EF 55-65%, inf HK, Mild AI, mild MR, mild BAE;  f. Myoview 7/14: int risk-> cath 04/2013 s/p PTCA/DES to prox Cx 05/04/13.  . Chronic diastolic CHF (congestive heart failure) ([place])   . GERD (gastroesophageal reflux disease)   . Glaucoma   . High cholesterol   . Hypertension   . Hypokalemia 02/2017  . Hypothyroidism   . LBBB (left bundle branch block)    chronic  . Myocardial infarction ([place]) 03/1991 X 2; 07/1991  . Obesity   . Seizures ([place])    "I'm on Dilantin" (06/10/2014)  . Sickle cell trait ([place])   . Type II diabetes mellitus ([place])  Past Surgical History:  Procedure Laterality Date  . CARDIAC CATHETERIZATION     "several;  they went up to check"  . CARDIAC CATHETERIZATION N/A 09/28/2016   Procedure: Left Heart Cath and Coronary Angiography;  Surgeon: Peter M Martinique, MD;  Location: [place] [facility];  Service: Cardiovascular;  Laterality: N/A;  . CATARACT EXTRACTION W/ INTRAOCULAR LENS  IMPLANT, BILATERAL Bilateral   . CHOLECYSTECTOMY    . COLONOSCOPY N/A 06/12/2014   Procedure: COLONOSCOPY;  Surgeon: Cleotis Nipper, MD;  Location:  [facility] ENDOSCOPY;  Service: Endoscopy;  Laterality: N/A;  . CORONARY ANGIOPLASTY WITH STENT PLACEMENT  ? date; 05/04/2013   ? location; DES  to [place]  . CORONARY ARTERY BYPASS GRAFT  07/1991   "CABG X 3"  . EP IMPLANTABLE DEVICE N/A 09/29/2016   Procedure: Pacemaker Implant;  Surgeon: Will Meredith Leeds, MD;  Location: [place] [facility];  Service: Cardiovascular;  Laterality: N/A;  . ESOPHAGOGASTRODUODENOSCOPY N/A 06/12/2014   Procedure: ESOPHAGOGASTRODUODENOSCOPY (EGD);  Surgeon: Cleotis Nipper, MD;  Location: [facility] ENDOSCOPY;  Service: Endoscopy;  Laterality: N/A;  . LEFT AND RIGHT HEART CATHETERIZATION WITH CORONARY ANGIOGRAM N/A 05/04/2013   Procedure: LEFT AND RIGHT HEART CATHETERIZATION WITH CORONARY ANGIOGRAM;  Surgeon: Larey Dresser, MD;  Location: [facility] CATH LAB;  Service: Cardiovascular;  Laterality: N/A;  . LEFT HEART CATH AND CORS/GRAFTS ANGIOGRAPHY N/A 03/22/2018   Procedure: LEFT HEART CATH AND CORS/GRAFTS ANGIOGRAPHY;  Surgeon: Sherren Mocha, MD;  Location: [facility];  Service: Cardiovascular;  Laterality: N/A;  . PERCUTANEOUS STENT INTERVENTION  05/04/2013   Procedure: PERCUTANEOUS STENT INTERVENTION;  Surgeon: Larey Dresser, MD;  Location: [facility] CATH LAB;  Service: Cardiovascular;;  . TUBAL LIGATION Bilateral 1969    Allergies  Allergen Reactions  . Meperidine Hcl Other (See Comments)    Demerol - mental status changes    . Nitroglycerin Other (See Comments)    Patch and Cream only - blisters  . Morphine Palpitations   (Not in a hospital admission)  Family History  Problem Relation Age of Onset  . Diabetes Mother     Social History   Socioeconomic History  . Marital status: Married    Spouse name: Not on file  . Number of children: Not on file  . Years of education: Not on file  . Highest education level: Not on file  Occupational History  . Not on file  Tobacco Use  . Smoking status: Never Smoker  . Smokeless tobacco: Never Used  Vaping Use  . Vaping  Use: Never used  Substance and Sexual Activity  . Alcohol use: No  . Drug use: No  . Sexual activity: Never  Other Topics Concern  . Not on file  Social History Narrative  . Not on file   Social Determinants of Health   Financial Resource Strain:   . Difficulty of Paying Living Expenses:   Food Insecurity:   . Worried About Charity fundraiser in the Last Year:   . Arboriculturist in the Last Year:   Transportation Needs:   . Film/video editor (Medical):   Marland Kitchen Lack of Transportation (Non-Medical):   Physical Activity:   . Days of Exercise per Week:   . Minutes of Exercise per Session:   Stress:   . Feeling of Stress :   Social Connections:   . Frequency of Communication with Friends and Family:   . Frequency of Social Gatherings with Friends and Family:   . Attends  Religious Services:   . Active Member of Clubs or Organizations:   . Attends Archivist Meetings:   Marland Kitchen Marital Status:   Intimate Partner Violence:   . Fear of Current or Ex-Partner:   . Emotionally Abused:   Marland Kitchen Physically Abused:   . Sexually Abused:      Review of Systems: [y] = yes, [ ]  = no       General: Weight gain [] ; Weight loss [ ] ; Anorexia [ ] ; Fatigue [ ] ; Fever [ ] ; Chills [ ] ; Weakness [ ]     Cardiac: Chest pain/pressure [y]; Resting SOB [ ] ; Exertional SOB [ ] ; Orthopnea [ ] ; Pedal Edema [ ] ; Palpitations [ ] ; Syncope [ ] ; Presyncope [ ] ; Paroxysmal nocturnal dyspnea[ ]     Pulmonary: Cough [ ] ; Wheezing[ ] ; Hemoptysis[ ] ; Sputum [ ] ; Snoring [ ]     GI: Vomiting[ ] ; Dysphagia[ ] ; Melena[ ] ; Hematochezia [ ] ; Heartburn[ ] ; Abdominal pain [ ] ; Constipation [ ] ; Diarrhea [ ] ; BRBPR [ ]     GU: Hematuria[ ] ; Dysuria [ ] ; Nocturia[ ]   Vascular: Pain in legs with walking [ ] ; Pain in feet with lying flat [ ] ; Non-healing sores [ ] ; Stroke [ ] ; TIA [ ] ; Slurred speech [ ] ;    Neuro: Headaches[ ] ; Vertigo[ ] ; Seizures[ ] ; Paresthesias[ ] ;Blurred vision [ ] ; Diplopia [ ] ; Vision changes  [ ]     Ortho/Skin: Arthritis [ ] ; Joint pain [ y]; Muscle pain [ ] ; Joint swelling [ ] ; Back Pain [ ] ; Rash [ ]     Psych: Depression[ ] ; Anxiety[ ]     Heme: Bleeding problems [ ] ; Clotting disorders [ ] ; Anemia [ ]     Endocrine: Diabetes [ ] ; Thyroid dysfunction[ ]   Physical Exam: Blood pressure (!) 131/57, pulse 59, temperature 98.4 F (36.9 C), temperature source Oral, resp. rate 21, SpO2 95 %.   GENERAL: Patient is afebrile, Vital signs reviewed, Well appearing, Patient appears comfortable, Alert and lucid. EYES: Normal inspection. HEENT:  normocephalic, atraumatic.  NECK:  supple , normal inspection. LAD at clavicle at 45 degrees CARD:  regular rate and rhythm with occasional ectopy, heart sounds normal. RESP:  no respiratory distress, breath sounds normal. ABD: soft, nontender to palpation, BS present, soft MUSC:  Pain with movement of bilateral right and left shoulders. Non-tender to palpation, no pedal edema . SKIN: color normal, no rash, warm, dry . NEURO: awake & alert, lucid, no motor/sensory deficit. Gait stable. PSYCH: mood/affect normal.  Labs: Lab Results  Component Value Date   BUN 15 04/27/2020   Lab Results  Component Value Date   CREATININE 1.20 (H) 04/27/2020   Lab Results  Component Value Date   NA 140 04/27/2020   K 3.6 04/27/2020   CL 104 04/27/2020   CO2 26 04/27/2020   Lab Results  Component Value Date   TROPONINI <0.03 03/19/2018   Lab Results  Component Value Date   WBC 6.2 04/27/2020   HGB 11.7 (L) 04/27/2020   HCT 36.1 04/27/2020   MCV 101.1 (H) 04/27/2020   PLT 168 04/27/2020   Lab Results  Component Value Date   CHOL 151 06/29/2019   HDL 52 06/29/2019   LDLCALC 86 06/29/2019   TRIG 66 06/29/2019   CHOLHDL 2.9 06/29/2019   Lab Results  Component Value Date   ALT 13 (L) 03/10/2017   AST 19 03/10/2017   ALKPHOS 102 03/10/2017   BILITOT 0.5 03/10/2017      Radiology:  CXR: IMPRESSION: No active cardiopulmonary disease.  No evidence of pneumonia or pulmonary edema.  EKG: Dual AV paced rhythm at 60 bpm   LHC (03/22/2018)  LM lesion is 35% stenosed.  Prox LAD lesion is 100% stenosed.  Prox Cx to Mid Cx lesion is 30% stenosed.  Ost 2nd Mrg to 2nd Mrg lesion is 100% stenosed.  Ost 1st Mrg to 1st Mrg lesion is 100% stenosed.  Ost RCA to Prox RCA lesion is 100% stenosed.  LIMA and is normal in caliber.  SVG and is normal in caliber.  SVG due to known occlusion.  Origin to Prox Graft lesion is 100% stenosed.  Mid LM lesion is 50% stenosed.   1. Severe native vessel CAD with total occlusion of the LAD, RCA, and OM branch of the circumflex 2. S/p CABG With continued patency of the SVG-OM and LIMA-LAD 3. Moderate left main stenosis and mild LCx in-stent restenosis   Echo (03/20/2018) Study Conclusions   - Left ventricle: The cavity size was normal. There was moderate  concentric hypertrophy. Systolic function was at the lower limits  of normal. The estimated ejection fraction was in the range of  50% to 55%. Hypokinesis of the basalinferolateral myocardium.  Doppler parameters are consistent with abnormal left ventricular  relaxation (grade 1 diastolic dysfunction). Indeterminate mean  left atrial filling pressure.  - Ventricular septum: Septal motion showed abnormal function,  dyssynergy, and paradox. These changes are consistent with right  ventricular pacing.  - Aortic valve: There was mild regurgitation.  - Mitral valve: There was mild regurgitation.    ASSESSMENT AND PLAN:  Christine Gay is a 79 year old woman with CAD s/p remote CABG (1992) and PCI, DMII (A1c 5.6% in 2019),  HTN, paroxysmal atrial fibrillation (on warfarin), HFpEF, CHB s/p St. Jude dc-PPM, hypothyroidism, and seizure disorder who presents for evaluation of chest pressure and was subsequently diagnosed with an NSTEMI.  Cardiology is consulted for assistance with evaluation and management.  Last LHC in 2019  showed severe native vessel CAD s/p bypass with continued patency of the vein graft to the OM and LIMA-LAD. There was moderate LM stenosis and mild LCx in-stent restenosis, either of which could have progressed to become hemodynamically significant since then.    # NSTEMI # CAD s/p CABG and remote PCI # DMII # CHB s/p PPM # HTN # pAF # HFpEF # Hypothyroidism # History of Seizure Disorder  - Admit to Medicine; appreciate assistance with this medically complex patient - Plan for diagnostic LHC in AM  - Patient should remain NPO except for meds - Start heparin IV infusion protocol - Monitor on telemetry - Symptom management with SLNTG - Trend trop and serial ECGs - TTE in AM - Aggressive risk factor modification  - Cont home regimen of carvedilol, rosuvastatin, Imdur and Zetia - Hold Lasix and warfarin overnight (INR pending)      Signed: Clois Dupes 04/28/2020, 2:52 AM

## 2020-04-28 NOTE — Progress Notes (Addendum)
ANTICOAGULATION CONSULT NOTE - Follow Up Consult  Pharmacy Consult for Warfarin Indication: Atrial Fibrillation  Allergies  Allergen Reactions  . Meperidine Hcl Other (See Comments)    Demerol - mental status changes    . Nitroglycerin Other (See Comments)    Patch and Cream only - blisters  . Morphine Palpitations    Patient Measurements: Total Body Weight: 79.8 kg Height: 60 inches Heparin Dosing Weight: 65 kg  Vital Signs: BP: 153/68 (08/02 1630) Pulse Rate: 71 (08/02 1602)  Labs: Recent Labs    04/27/20 1705 04/27/20 1705 04/28/20 0104 04/28/20 0525 04/28/20 0529 04/28/20 0728 04/28/20 1051  HGB 11.7*  --   --   --   --  11.7*  --   HCT 36.1  --   --   --   --  35.3*  --   PLT 168  --   --   --   --   --   --   APTT  --   --   --   --  29  --   --   LABPROT  --   --   --   --  18.0*  --   --   INR  --   --   --   --  1.6*  --   --   CREATININE 1.20*  --   --  1.11*  --   --   --   TROPONINIHS 62*   < > 2,379*  --   --  2,363* 2,273*   < > = values in this interval not displayed.    Estimated Creatinine Clearance: 38.4 mL/min (A) (by C-G formula based on SCr of 1.11 mg/dL (H)).   Medical History: Past Medical History:  Diagnosis Date  . Anemia    a. Noted on labs 04/2013.  . Anginal pain ([place])   . Arthritis    "legs" (06/10/2014)  . CAD (coronary artery disease)    a. s/p CABG 1992, b. s/p PCI several years ago; c. LHC 11/08: L-LAD ok, S-OM1 ok, S-OM2 CTO, prox to mid CFX 70% (unchanged), RCA non-dominant, occluded;  d.  Echo 2/11: EF 55%, mod LVH, Ao sclerosis;  e. Echo 6/14: mild LVH, mild focal basal sept hypertrophy, EF 55-65%, inf HK, Mild AI, mild MR, mild BAE;  f. Myoview 7/14: int risk-> cath 04/2013 s/p PTCA/DES to prox Cx 05/04/13.  . Chronic diastolic CHF (congestive heart failure) ([place])   . GERD (gastroesophageal reflux disease)   . Glaucoma   . High cholesterol   . Hypertension   . Hypokalemia 02/2017  . Hypothyroidism   . LBBB (left bundle  branch block)    chronic  . Myocardial infarction ([place]) 03/1991 X 2; 07/1991  . Obesity   . Seizures ([place])    "I'm on Dilantin" (06/10/2014)  . Sickle cell trait ([place])   . Type II diabetes mellitus ([facility])     Assessment: Pharmacy was consulted to dose heparin for 79 yr old female admitted with chest pain and elevated troponins; pt has hx of atrial fibrillation for which she takes warfarin PTA (INR subtherapeutic at 1.6 on admission). Admission H/H 11.7/36.1, platelets 168; H/H stable today.  Pt is S/P cardiac cath this afternoon. Pharmacy is consulted to resume pt's home warfarin today; per cath report, INR can drift up without post-procedure heparin; heparin has been discontinued on signed and held orders post cath. Per cath lab RN, no bleeding issues post cath.  Pt's home warfarin  regimen: 5 mg PO daily on MWF 7.5 mg PO daily on TTTS (per med rec, pt took last dose at 04/27/20 at 1700; INR subtherapeutic on admission; INR was therapeutic at 2.1 at 03/20/20 coag clinic visit)  Albumin 3.4, AST/ALT 44/26, pt no longer NPO post cath; pt on levothyroxine, phenytoin (was on PTA while taking warfarin)   Goal of Therapy:  INR: 2.0-3.0 Monitor platelets by anticoagulation protocol: Yes   Plan:  Discontinue heparin post cath Warfarin 7.5 mg PO X 1 this evening (INR subtherapeutic on admission) Monitor daily INR, CBC  Gillermina Hu, PharmD, BCPS, [facility] Clinical Pharmacist 04/28/2020,4:40 PM

## 2020-04-28 NOTE — Progress Notes (Signed)
ANTICOAGULATION CONSULT NOTE - Initial Consult  Pharmacy Consult for Heparin Indication: chest pain/ACS  Allergies  Allergen Reactions  . Meperidine Hcl Other (See Comments)    Demerol - mental status changes    . Nitroglycerin Other (See Comments)    Patch and Cream only - blisters  . Morphine Palpitations    Patient Measurements:   Heparin Dosing Weight: 65 kg  Vital Signs: Temp: 98.4 F (36.9 C) (08/01 1854) Temp Source: Oral (08/01 1854) BP: 131/57 (08/02 0116) Pulse Rate: 59 (08/02 0116)  Labs: Recent Labs    04/27/20 1705 04/28/20 0104  HGB 11.7*  --   HCT 36.1  --   PLT 168  --   CREATININE 1.20*  --   TROPONINIHS 62* 2,379*    Estimated Creatinine Clearance: 35.5 mL/min (A) (by C-G formula based on SCr of 1.2 mg/dL (H)).   Medical History: Past Medical History:  Diagnosis Date  . Anemia    a. Noted on labs 04/2013.  . Anginal pain ([place])   . Arthritis    "legs" (06/10/2014)  . CAD (coronary artery disease)    a. s/p CABG 1992, b. s/p PCI several years ago; c. LHC 11/08: L-LAD ok, S-OM1 ok, S-OM2 CTO, prox to mid CFX 70% (unchanged), RCA non-dominant, occluded;  d.  Echo 2/11: EF 55%, mod LVH, Ao sclerosis;  e. Echo 6/14: mild LVH, mild focal basal sept hypertrophy, EF 55-65%, inf HK, Mild AI, mild MR, mild BAE;  f. Myoview 7/14: int risk-> cath 04/2013 s/p PTCA/DES to prox Cx 05/04/13.  . Chronic diastolic CHF (congestive heart failure) ([place])   . GERD (gastroesophageal reflux disease)   . Glaucoma   . High cholesterol   . Hypertension   . Hypokalemia 02/2017  . Hypothyroidism   . LBBB (left bundle branch block)    chronic  . Myocardial infarction ([place]) 03/1991 X 2; 07/1991  . Obesity   . Seizures ([place])    "I'm on Dilantin" (06/10/2014)  . Sickle cell trait ([place])   . Type II diabetes mellitus ([facility])     Medications:  No current facility-administered medications on file prior to encounter.   Current Outpatient Medications on File Prior to Encounter   Medication Sig Dispense Refill  . Accu-Chek Softclix Lancets lancets     . acetaminophen (TYLENOL) 325 MG tablet Take 1-2 tablets (325-650 mg total) by mouth every 4 (four) hours as needed for mild pain.    . Blood Glucose Monitoring Suppl (ACCU-CHEK AVIVA PLUS) w/Device KIT     . carvedilol (COREG) 6.25 MG tablet TAKE 1/2 TABLET BY MOUTH 2 TIMES A DAY WITH A MEAL. 90 tablet 4  . cycloSPORINE (RESTASIS) 0.05 % ophthalmic emulsion Place 1 drop into both eyes 2 (two) times daily.    . diclofenac (FLECTOR) 1.3 % PTCH Place 1 patch onto the skin 2 (two) times daily. 5 patch 1  . enoxaparin (LOVENOX) 120 MG/0.8ML injection Inject 0.8 mLs (120 mg total) into the skin daily. 10 Syringe 1  . ezetimibe (ZETIA) 10 MG tablet TAKE 1 TABLET BY MOUTH DAILY. 90 tablet 3  . ferrous sulfate 325 (65 FE) MG tablet Take 325 mg by mouth daily with breakfast.    . furosemide (LASIX) 40 MG tablet TAKE 1 AND 1/2 TABLETS BY MOUTH 2 TIMES DAILY. 180 tablet 2  . glucose blood test strip Test blood glucose three times daily. 100 each 0  . ibandronate (BONIVA) 150 MG tablet Take 150 mg by mouth every 30 (  thirty) days. Take in the morning with a full glass of water, on an empty stomach, and do not take anything else by mouth or lie down for the next 30 min.    . isosorbide mononitrate (IMDUR) 60 MG 24 hr tablet Take 1 tablet (60 mg total) by mouth 2 (two) times daily. 180 tablet 1  . levothyroxine (SYNTHROID, LEVOTHROID) 100 MCG tablet Take 100 mcg by mouth daily before breakfast.    . metFORMIN (GLUCOPHAGE-XR) 500 MG 24 hr tablet Take 500 mg by mouth 2 (two) times daily.     . nitroGLYCERIN (NITROSTAT) 0.4 MG SL tablet PLACE 1 TABLET (0.4 MG TOTAL) UNDER THE TONGUE EVERY 5 (FIVE) MINUTES AS NEEDED FOR CHEST PAIN. 25 tablet 11  . pantoprazole (PROTONIX) 40 MG tablet TAKE 1 TABLET BY MOUTH DAILY. 90 tablet 2  . phenytoin (DILANTIN) 100 MG ER capsule Take 100-200 mg by mouth 2 (two) times daily. 200 mg in the morning and 100 mg  in the evening    . potassium chloride SA (KLOR-CON) 20 MEQ tablet TAKE 1 TABLET BY MOUTH 2 TIMES DAILY. 180 tablet 1  . rosuvastatin (CRESTOR) 40 MG tablet Take 1 tablet (40 mg total) by mouth daily. 90 tablet 3  . timolol (TIMOPTIC) 0.5 % ophthalmic solution Place 1 drop into both eyes daily.   6  . warfarin (COUMADIN) 5 MG tablet Take 1-1.5 tablets daily or as directed by Anticoagulation Clinic. 135 tablet 1     Assessment: 79 y.o. female admitted with chest pain, h/o Afib on Coumadin and INR subtherapeutic, for heparin.     Goal of Therapy:  Heparin level 0.3-0.7 units/ml Monitor platelets by anticoagulation protocol: Yes   Plan:  Heparin 1500 units IV bolus, then start heparin 850 units/hr Check heparin level in 8 hours.     Amaria Mundorf, [place]onson Curb 04/28/2020,2:41 AM

## 2020-04-28 NOTE — Progress Notes (Signed)
  Echocardiogram 2D Echocardiogram with definity has been performed.  Christine Gay M 04/28/2020, 10:26 AM

## 2020-04-28 NOTE — ED Notes (Signed)
Pt c/o 8/10 epigastric pain with excessive belching. MD notified, will try NTG first then maalox

## 2020-04-28 NOTE — ED Notes (Signed)
Pt reports abd pain now significantly worse after starting nitro gtt, requesting gtt be stopped. Nitro paused

## 2020-04-29 ENCOUNTER — Encounter (HOSPITAL_COMMUNITY): Payer: Self-pay | Admitting: Cardiology

## 2020-04-29 LAB — GLUCOSE, CAPILLARY
Glucose-Capillary: 106 mg/dL — ABNORMAL HIGH (ref 70–99)
Glucose-Capillary: 106 mg/dL — ABNORMAL HIGH (ref 70–99)
Glucose-Capillary: 128 mg/dL — ABNORMAL HIGH (ref 70–99)
Glucose-Capillary: 128 mg/dL — ABNORMAL HIGH (ref 70–99)
Glucose-Capillary: 86 mg/dL (ref 70–99)
Glucose-Capillary: 96 mg/dL (ref 70–99)
Glucose-Capillary: 96 mg/dL (ref 70–99)

## 2020-04-29 LAB — COMPREHENSIVE METABOLIC PANEL
ALT: 26 U/L (ref 0–44)
AST: 40 U/L (ref 15–41)
Albumin: 3.1 g/dL — ABNORMAL LOW (ref 3.5–5.0)
Alkaline Phosphatase: 61 U/L (ref 38–126)
Anion gap: 9 (ref 5–15)
BUN: 12 mg/dL (ref 8–23)
CO2: 25 mmol/L (ref 22–32)
Calcium: 8.3 mg/dL — ABNORMAL LOW (ref 8.9–10.3)
Chloride: 103 mmol/L (ref 98–111)
Creatinine, Ser: 1.07 mg/dL — ABNORMAL HIGH (ref 0.44–1.00)
GFR calc Af Amer: 57 mL/min — ABNORMAL LOW (ref >60)
GFR calc non Af Amer: 49 mL/min — ABNORMAL LOW (ref >60)
Glucose, Bld: 153 mg/dL — ABNORMAL HIGH (ref 70–99)
Potassium: 3.4 mmol/L — ABNORMAL LOW (ref 3.5–5.1)
Sodium: 137 mmol/L (ref 135–145)
Total Bilirubin: 0.7 mg/dL (ref 0.3–1.2)
Total Protein: 6.3 g/dL — ABNORMAL LOW (ref 6.5–8.1)

## 2020-04-29 LAB — CBC
HCT: 33.7 % — ABNORMAL LOW (ref 36.0–46.0)
Hemoglobin: 11.1 g/dL — ABNORMAL LOW (ref 12.0–15.0)
MCH: 32.6 pg (ref 26.0–34.0)
MCHC: 32.9 g/dL (ref 30.0–36.0)
MCV: 98.8 fL (ref 80.0–100.0)
Platelets: 157 10*3/uL (ref 150–400)
RBC: 3.41 MIL/uL — ABNORMAL LOW (ref 3.87–5.11)
RDW: 11.8 % (ref 11.5–15.5)
WBC: 6.6 10*3/uL (ref 4.0–10.5)
nRBC: 0 % (ref 0.0–0.2)

## 2020-04-29 LAB — PROTIME-INR
INR: 1.5 — ABNORMAL HIGH (ref 0.8–1.2)
Prothrombin Time: 17.3 seconds — ABNORMAL HIGH (ref 11.4–15.2)

## 2020-04-29 MED ORDER — ASPIRIN 81 MG PO CHEW
81.0000 mg | CHEWABLE_TABLET | Freq: Every day | ORAL | Status: DC
  Administered 2020-04-29 – 2020-04-30 (×2): 81 mg via ORAL
  Filled 2020-04-29 (×2): qty 1

## 2020-04-29 MED ORDER — WARFARIN SODIUM 7.5 MG PO TABS
7.5000 mg | ORAL_TABLET | Freq: Once | ORAL | Status: AC
  Administered 2020-04-29: 7.5 mg via ORAL
  Filled 2020-04-29: qty 1

## 2020-04-29 MED ORDER — DICLOFENAC SODIUM 1 % EX GEL
2.0000 g | Freq: Three times a day (TID) | CUTANEOUS | Status: DC
  Administered 2020-04-29 (×2): 2 g via TOPICAL
  Filled 2020-04-29: qty 100

## 2020-04-29 MED ORDER — ALUM & MAG HYDROXIDE-SIMETH 200-200-20 MG/5ML PO SUSP
30.0000 mL | ORAL | Status: DC | PRN
  Filled 2020-04-29: qty 30

## 2020-04-29 MED ORDER — POTASSIUM CHLORIDE CRYS ER 20 MEQ PO TBCR
40.0000 meq | EXTENDED_RELEASE_TABLET | Freq: Once | ORAL | Status: AC
  Administered 2020-04-29: 40 meq via ORAL
  Filled 2020-04-29: qty 2

## 2020-04-29 MED FILL — Nitroglycerin IV Soln 100 MCG/ML in D5W: INTRA_ARTERIAL | Qty: 10 | Status: AC

## 2020-04-29 NOTE — Progress Notes (Signed)
[facility] Progress Note  Christine Gay 79 y.o. August 24, 1941  CC:  Abnormal CT  Subjective: Patient reports some upper abdominal pain overnight with radiation into her chest.  Denies any current abdominal pain.  Denies any nausea or vomiting.  Has not had a bowel movement since admission.  ROS : Review of Systems  Cardiovascular: Negative for chest pain and palpitations.  Gastrointestinal: Negative for abdominal pain, blood in stool, constipation, diarrhea, heartburn, melena, nausea and vomiting.   Objective: Vital signs in last 24 hours: Vitals:   04/29/20 0424 04/29/20 0848  BP: (!) 100/50 100/64  Pulse: 60 (!) 57  Resp: 20 15  Temp: 98.6 F (37 C) 98.4 F (36.9 C)  SpO2: 93% 100%    Physical Exam:  General:  Alert, pleasant, cooperative, no acute distress  Head:  Normocephalic, without obvious abnormality, atraumatic  Eyes:  Anicteric sclera, EOMs intact  Lungs:   Clear to auscultation bilaterally, respirations unlabored  Heart:  Regular rate and rhythm, S1, S2 normal  Abdomen:   Soft, non-tender, non-distended, bowel sounds active all four quadrants,  no guarding or peritoneal signs  Extremities: Extremities normal, atraumatic, no  edema  Pulses: 2+ and symmetric    Lab Results: Recent Labs    04/28/20 0525 04/29/20 0546  NA 140 137  K 3.5 3.4*  CL 104 103  CO2 27 25  GLUCOSE 89 153*  BUN 14 12  CREATININE 1.11* 1.07*  CALCIUM 8.5* 8.3*  MG 2.2  --    Recent Labs    04/28/20 0525 04/29/20 0546  AST 44* 40  ALT 26 26  ALKPHOS 60 61  BILITOT 0.6 0.7  PROT 6.7 6.3*  ALBUMIN 3.4* 3.1*   Recent Labs    04/27/20 1705 04/27/20 1705 04/28/20 0728 04/29/20 0546  WBC 6.2  --   --  6.6  HGB 11.7*   < > 11.7* 11.1*  HCT 36.1   < > 35.3* 33.7*  MCV 101.1*  --   --  98.8  PLT 168  --   --  157   < > = values in this interval not displayed.   Recent Labs    04/28/20 0529 04/29/20 0546  LABPROT 18.0* 17.3*  INR 1.6* 1.5*    Impression: Abnormal CT: Suspect ischemic colitis -CT 04/28/2020: Caliber change in the colon at the splenic flexure with mild surrounding stranding. Findings could reflect colitis related to watershed ischemia given patient history. Consider close attention on follow-up and correlation with lactate. No masslike features. -Lactate normal (0.9) 8/2 -Hemoglobin 11.1 today, mildly decreased from 11.7 yesterday  NSTEMI s/p PCI yesterday  Plan: Patient is feeling well. Denies abdominal pain this morning.  Is tolerating a diet.   Continue supportive care.  Continue Protonix qd for GERD.  [facility] will sign off. Please contact us if we can be of any further assistance during this hospital stay.  Salley Slaughter PA-C 04/29/2020, 9:04 AM  Contact #  [phone]

## 2020-04-29 NOTE — Progress Notes (Signed)
Patient ID: Christine Gay, female   DOB: 10-22-1940, 79 y.o.   MRN: [id number]     Advanced Heart Failure Rounding Note  PCP-Cardiologist: [name], MD   Subjective:    Stable s/p PCI to SVG-OM2 yesterday.  No chest pain.   She has had some right shoulder pain, this is chronic.   She had some abdominal pain overnight but now resolved and she is eating.  CT abdomen/pelvis yesterday was concerning for area of ischemic colitis.  Lactate was normal.   Echo showed EF 50-55%, mild LVH, mildly decreased RV systolic function, IVC normal.    Objective:   Weight Range: 78.7 kg Body mass index is 33.9 kg/m.   Vital Signs:   Temp:  [97.6 F (36.4 C)-98.6 F (37 C)] 98.4 F (36.9 C) (08/03 0848) Pulse Rate:  [57-88] 57 (08/03 0848) Resp:  [10-22] 15 (08/03 0848) BP: (89-170)/(50-138) 100/64 (08/03 0848) SpO2:  [93 %-100 %] 100 % (08/03 0848) Weight:  [78.7 kg-82 kg] 78.7 kg (08/03 0442)    Weight change: Filed Weights   04/28/20 1756 04/29/20 0442  Weight: 82 kg 78.7 kg    Intake/Output:   Intake/Output Summary (Last 24 hours) at 04/29/2020 0946 Last data filed at 04/29/2020 0400 Gross per 24 hour  Intake 240.85 ml  Output 1400 ml  Net -1159.15 ml      Physical Exam    General:  Well appearing. No resp difficulty HEENT: Normal Neck: Supple. JVP not elevated. Carotids 2+ bilat; no bruits. No lymphadenopathy or thyromegaly appreciated. Cor: PMI nondisplaced. Regular rate & rhythm. No rubs, gallops or murmurs. Lungs: Clear Abdomen: Soft, nontender, nondistended. No hepatosplenomegaly. No bruits or masses. Good bowel sounds. Extremities: No cyanosis, clubbing, rash, edema.  Left radial cath site benign.  Neuro: Alert & orientedx3, cranial nerves grossly intact. moves all 4 extremities w/o difficulty. Affect pleasant   Telemetry   A-v sequential pacing (personally reviewed)  EKG    NSR with V-pacing (personally reviewed).   Labs    CBC Recent Labs     04/27/20 1705 04/27/20 1705 04/28/20 0728 04/29/20 0546  WBC 6.2  --   --  6.6  HGB 11.7*   < > 11.7* 11.1*  HCT 36.1   < > 35.3* 33.7*  MCV 101.1*  --   --  98.8  PLT 168  --   --  157   < > = values in this interval not displayed.   Basic Metabolic Panel Recent Labs    04/28/20 0525 04/29/20 0546  NA 140 137  K 3.5 3.4*  CL 104 103  CO2 27 25  GLUCOSE 89 153*  BUN 14 12  CREATININE 1.11* 1.07*  CALCIUM 8.5* 8.3*  MG 2.2  --    Liver Function Tests Recent Labs    04/28/20 0525 04/29/20 0546  AST 44* 40  ALT 26 26  ALKPHOS 60 61  BILITOT 0.6 0.7  PROT 6.7 6.3*  ALBUMIN 3.4* 3.1*   No results for input(s): LIPASE, AMYLASE in the last 72 hours. Cardiac Enzymes No results for input(s): CKTOTAL, CKMB, CKMBINDEX, TROPONINI in the last 72 hours.  BNP: BNP (last 3 results) No results for input(s): BNP in the last 8760 hours.  ProBNP (last 3 results) No results for input(s): PROBNP in the last 8760 hours.   D-Dimer No results for input(s): DDIMER in the last 72 hours. Hemoglobin A1C Recent Labs    04/28/20 0528  HGBA1C 5.7*   Fasting Lipid Panel  Recent Labs    04/28/20 0525  CHOL 166  HDL 57  LDLCALC 92  TRIG 84  CHOLHDL 2.9   Thyroid Function Tests Recent Labs    04/28/20 0526  TSH 7.443*    Other results:   Imaging    CARDIAC CATHETERIZATION  Result Date: 04/28/2020 1. Severe multivessel CAD with continued patency of the LIMA graft-LAD and severe stenosis of the SVG-OM (see Dr Claris Gladden full report) 2. Successful PCI of the SVG-OM with a 3.0x20 mm Synergy DES Recommend:  Resume warfarin today per pharmacy (INR can drift up without post-procedural heparin)  ASA 81 mg x 30 days  plavix 75 mg x 6 months minimum, consider longer term Rx in this patient with extensive SVG disease  OK for discharge tomorrow am if no complications arise  CARDIAC CATHETERIZATION  Result Date: 04/28/2020 1. Patent LIMA-LAD. 2. Known occluded SVG-OM1 and RCA.  3. SVG-OM2 with 95% ostial stenosis. Suspect SVG-OM2 lesion is the culprit for NSTEMI, plan intervention with Dr. Burt Knack.  Will plan intervention.  Will use Plavix as she will need coumadin going forward.   ECHOCARDIOGRAM COMPLETE  Result Date: 04/28/2020    ECHOCARDIOGRAM REPORT   Patient Name:   REITA SHINDLER Date of Exam: 04/28/2020 Medical Rec #:  [id number]        Height:       60.0 in Accession #:    [id number]       Weight:       176.0 lb Date of Birth:  [birth year]         BSA:          1.768 m Patient Age:    72 years         BP:           163/69 mmHg Patient Gender: F                HR:           82 bpm. Exam Location:  Inpatient Procedure: 2D Echo Indications:    NSTEMI I21.4  History:        Patient has prior history of Echocardiogram examinations, most                 recent 03/20/2018. CHF, CAD and Acute MI, Prior CABG and                 Pacemaker, Arrythmias:Complete heart block and LBBB; Risk                 Factors:Hypertension, Diabetes and Dyslipidemia. Severe fatigue                 and dyspnea. History of Afib.  Sonographer:    Darlina Sicilian RDCS Referring Phys: [id number] [place]  1. Left ventricular ejection fraction, by estimation, is 50 to 55%. The left ventricle has low normal function. The left ventricle demonstrates regional wall motion abnormalities (see scoring diagram/findings for description). There is mild left ventricular hypertrophy. Left ventricular diastolic parameters are consistent with Grade I diastolic dysfunction (impaired relaxation).  2. Right ventricular systolic function is mildly reduced. The right ventricular size is normal. There is normal pulmonary artery systolic pressure. The estimated right ventricular systolic pressure is 74.2 mmHg.  3. The mitral valve is normal in structure. Mild mitral valve regurgitation. No evidence of mitral stenosis.  4. The aortic valve is normal in structure. Aortic valve regurgitation is mild to moderate. No aortic  stenosis is present.  5. The inferior vena cava is normal in size with greater than 50% respiratory variability, suggesting right atrial pressure of 3 mmHg. FINDINGS  Left Ventricle: Left ventricular ejection fraction, by estimation, is 50 to 55%. The left ventricle has low normal function. The left ventricle demonstrates regional wall motion abnormalities. The left ventricular internal cavity size was normal in size. There is mild left ventricular hypertrophy. Abnormal (paradoxical) septal motion, consistent with left bundle branch block. Left ventricular diastolic parameters are consistent with Grade I diastolic dysfunction (impaired relaxation).  LV Wall Scoring: The antero-lateral wall and posterior wall are hypokinetic. Right Ventricle: The right ventricular size is normal. No increase in right ventricular wall thickness. Right ventricular systolic function is mildly reduced. There is normal pulmonary artery systolic pressure. The tricuspid regurgitant velocity is 2.41 m/s, and with an assumed right atrial pressure of 3 mmHg, the estimated right ventricular systolic pressure is 70.9 mmHg. Left Atrium: Left atrial size was normal in size. Right Atrium: Right atrial size was normal in size. Pericardium: There is no evidence of pericardial effusion. Presence of pericardial fat pad. Mitral Valve: The mitral valve is normal in structure. Normal mobility of the mitral valve leaflets. Mild mitral annular calcification. Mild mitral valve regurgitation. No evidence of mitral valve stenosis. Tricuspid Valve: The tricuspid valve is normal in structure. Tricuspid valve regurgitation is mild . No evidence of tricuspid stenosis. Aortic Valve: Vena contracta of AR 0.4 cm. The aortic valve is normal in structure. Aortic valve regurgitation is mild to moderate. Aortic regurgitation PHT measures 592 msec. No aortic stenosis is present. Pulmonic Valve: The pulmonic valve was not well visualized. Pulmonic valve regurgitation is  not visualized. No evidence of pulmonic stenosis. Aorta: The aortic root is normal in size and structure. Venous: The inferior vena cava is normal in size with greater than 50% respiratory variability, suggesting right atrial pressure of 3 mmHg. IAS/Shunts: The interatrial septum was not well visualized.  LEFT VENTRICLE PLAX 2D LVIDd:         5.80 cm      Diastology LVIDs:         2.80 cm      LV e' lateral:   2.70 cm/s LV PW:         1.20 cm      LV E/e' lateral: 19.7 LV IVS:        1.40 cm      LV e' medial:    3.15 cm/s LVOT diam:     1.85 cm      LV E/e' medial:  16.9 LV SV:         48 LV SV Index:   27 LVOT Area:     2.69 cm  LV Volumes (MOD) LV vol d, MOD A2C: 98.1 ml LV vol d, MOD A4C: 113.0 ml LV vol s, MOD A2C: 42.5 ml LV vol s, MOD A4C: 66.7 ml LV SV MOD A2C:     55.6 ml LV SV MOD A4C:     113.0 ml LV SV MOD BP:      53.8 ml RIGHT VENTRICLE RV S prime:     9.90 cm/s TAPSE (M-mode): 1.4 cm LEFT ATRIUM             Index       RIGHT ATRIUM           Index LA diam:        3.30 cm 1.87 cm/m  RA Area:     11.10 cm LA  Vol [facility]):   53.6 ml 30.32 ml/m RA Volume:   20.50 ml  11.60 ml/m LA Vol (A4C):   48.6 ml 27.49 ml/m LA Biplane Vol: 50.8 ml 28.73 ml/m  AORTIC VALVE LVOT Vmax:   89.40 cm/s LVOT Vmean:  53.800 cm/s LVOT VTI:    0.178 m AI PHT:      592 msec  AORTA Ao Root diam: 3.00 cm MITRAL VALVE               TRICUSPID VALVE MV Area (PHT): 2.26 cm    TR Peak grad:   23.2 mmHg MV Decel Time: 335 msec    TR Vmax:        241.00 cm/s MV E velocity: 53.10 cm/s MV A velocity: 81.00 cm/s  SHUNTS MV E/A ratio:  0.66        Systemic VTI:  0.18 m                            Systemic Diam: 1.85 cm Cherlynn Kaiser MD Electronically signed by Cherlynn Kaiser MD Signature Date/Time: 04/28/2020/3:26:33 PM    Final       Medications:     Scheduled Medications: . carvedilol  3.125 mg Oral BID WC  . clopidogrel  75 mg Oral Q breakfast  . cycloSPORINE  1 drop Both Eyes BID  . diclofenac Sodium  2 g Topical TID AC  & HS  . ezetimibe  10 mg Oral QPM  . furosemide  60 mg Oral BID  . insulin aspart  0-15 Units Subcutaneous Q6H  . isosorbide mononitrate  60 mg Oral BID  . levothyroxine  100 mcg Oral QAC breakfast  . pantoprazole (PROTONIX) IV  40 mg Intravenous Daily  . phenytoin  200 mg Oral Daily   And  . phenytoin  100 mg Oral QHS  . rosuvastatin  40 mg Oral QPM  . sodium chloride flush  3 mL Intravenous Once  . sodium chloride flush  3 mL Intravenous Q12H  . timolol  1 drop Both Eyes BID  . Warfarin - Pharmacist Dosing Inpatient   Does not apply q1600     Infusions: . sodium chloride       PRN Medications:  sodium chloride, acetaminophen, alum & mag hydroxide-simeth, nitroGLYCERIN, ondansetron (ZOFRAN) IV, sodium chloride flush   Assessment/Plan   1. NSTEMI: H/o CABG remotely.  Cath yesterday with tight stenosis ostial SVG-OM2, now s/p PCI.  - ASA x 1 month - Plavix x 6 months minimum - Resume warfarin - Continue Zetia/Crestor 40.  LDL still elevated, would refer to lipid clinic for PCSK9 inhibitor as outpatient.  - Cardiac rehab.  - Continue Coreg 3.125 mg bid.  2. Chronic diastolic CHF: Echo stable this admission with EF 50-55%.  - Resume home Lasix 60 mg bid.  3. Atrial fibrillation: Paroxysmal.  She is in NSR.  4. Abdominal pain: CT abdomen/pelvis showed possible area of ischemic colitis at splenic flexure.  Lactate normal.   She was seen by GI and ok'd for cath and possible intervention. Mild pain overnight, now resolved and she is eating.  - Would not add any medications that would lower BP.  5. Out of bed, mobilize.   Would probably watch in hospital today, if abdominal pain controlled and otherwise stable, home tomorrow.   Length of Stay: 1  [name], MD  04/29/2020, 9:46 AM  Advanced Heart Failure Team Pager [phone] (M-F; 7a - 4p)  Please contact [facility] for night-coverage after hours (4p -7a ) and weekends on [email or web address]

## 2020-04-29 NOTE — Evaluation (Signed)
Physical Therapy Evaluation Patient Details Name: Christine Gay MRN: [id number] DOB: 1941/01/27 Today's Date: 04/29/2020   History of Present Illness  79 yo female with onset of chest pain with no relief from nitroglycerin was admitted to hosp and received L heart cath 04/28/20.  Pt has diagnosis of NSTEMI, with cardiomegaly, suspected colitis, abd pain, EF was 50-55%.  PMHx:  R shoulder pain, pacemaker, ICD, atherosclerosis, thoracic DJD, CABG, CHF, L BBB, R renal cyst  Clinical Impression  Pt was seen for mobility with telemetry in place, and monitored all vitals with good response by pt to doing exercise.  Pt does drop O2 sat with gait on room air, but replaced her finger probe to get better contact.  Pt is at 93% baseline, then down to 89% sitting up, back to 92% and walking dropped suddenly to 67%.  Pt may not have been reading so low with activity but did replace her finger contact to get better reading.  Sat 100% at rest with new probe.  Follow acutely and will need a new rollator for home.    Follow Up Recommendations Home health PT;Supervision for mobility/OOB;Other (comment)    Equipment Recommendations  Other (comment) (Rollator 4 wheel walker as pt has a broken device)    Recommendations for Other Services       Precautions / Restrictions Precautions Precautions: Fall Precaution Comments: monitor BP, pulse and sats Restrictions Weight Bearing Restrictions: No Other Position/Activity Restrictions: elevate RUE at rest until 6PM      Mobility  Bed Mobility Overal bed mobility: Needs Assistance Bed Mobility: Supine to Sit;Sit to Supine     Supine to sit: Min guard;Min assist Sit to supine: Min guard      Transfers Overall transfer level: Needs assistance Equipment used: None Transfers: Sit to/from Stand Sit to Stand: Min guard         General transfer comment: min guard for steadying balance  Ambulation/Gait Ambulation/Gait assistance: Min guard Gait  Distance (Feet): 60 Feet Assistive device: Rolling walker (2 wheeled);1 person hand held assist Gait Pattern/deviations: Step-through pattern;Decreased stride length;Wide base of support Gait velocity: .reduced Gait velocity interpretation: <1.31 ft/sec, indicative of household ambulator General Gait Details: pt is turning quickly and asking to walk down the hall but had to stop her over low O2 sats on monitor.  Values may have been artificially low but were surely down given sats measured 67% briefly.  Replaced hand monitor which was looser on one side  Stairs            Wheelchair Mobility    Modified Rankin (Stroke Patients Only)       Balance Overall balance assessment: Needs assistance Sitting-balance support: Feet supported Sitting balance-Leahy Scale: Good     Standing balance support: Bilateral upper extremity supported;During functional activity Standing balance-Leahy Scale: Fair                               Pertinent Vitals/Pain Pain Assessment: No/denies pain    Home Living Family/patient expects to be discharged to:: Private residence Living Arrangements: Children Available Help at Discharge: Family;Available PRN/intermittently Type of Home: House Home Access: Level entry     Home Layout: One level Home Equipment: Walker - 4 wheels;Cane - single point Additional Comments: SPC is with a friend    Prior Function Level of Independence: Independent with assistive device(s)         Comments: pt has used rollator to  sit and rest when doing kitchen tasks     Hand Dominance   Dominant Hand: Right    Extremity/Trunk Assessment   Upper Extremity Assessment Upper Extremity Assessment: Overall WFL for tasks assessed    Lower Extremity Assessment Lower Extremity Assessment: Generalized weakness    Cervical / Trunk Assessment Cervical / Trunk Assessment: Kyphotic  Communication   Communication: No difficulties  Cognition  Arousal/Alertness: Awake/alert Behavior During Therapy: WFL for tasks assessed/performed Overall Cognitive Status: Within Functional Limits for tasks assessed                                 General Comments: Pt is asking to use Rollator and explained to family why RW used first      General Comments General comments (skin integrity, edema, etc.): pt is up on room air but sats fluctuating from the beginning of the session.  Low 90's with supine posture, down to 89% sitting initially, then 92%.  Quick drop to 67% and back to 72 sitting initially after walk.  BP supported supine and sitting activity    Exercises     Assessment/Plan    PT Assessment Patient needs continued PT services  PT Problem List Decreased strength;Decreased range of motion;Decreased activity tolerance;Decreased mobility;Decreased balance;Decreased coordination;Decreased knowledge of use of DME;Cardiopulmonary status limiting activity       PT Treatment Interventions DME instruction;Gait training;Functional mobility training;Therapeutic activities;Therapeutic exercise;Balance training;Neuromuscular re-education;Patient/family education    PT Goals (Current goals can be found in the Care Plan section)  Acute Rehab PT Goals Patient Stated Goal: to get stronger and get home PT Goal Formulation: With patient/family Time For Goal Achievement: 05/13/20 Potential to Achieve Goals: Good    Frequency Min 3X/week   Barriers to discharge Decreased caregiver support home with daughter who will be mostly home    Co-evaluation               AM-PAC PT "6 Clicks" Mobility  Outcome Measure Help needed turning from your back to your side while in a flat bed without using bedrails?: None Help needed moving from lying on your back to sitting on the side of a flat bed without using bedrails?: None Help needed moving to and from a bed to a chair (including a wheelchair)?: A Little Help needed standing up  from a chair using your arms (e.g., wheelchair or bedside chair)?: A Little Help needed to walk in hospital room?: A Little Help needed climbing 3-5 steps with a railing? : A Lot 6 Click Score: 19    End of Session Equipment Utilized During Treatment: Gait belt Activity Tolerance: Treatment limited secondary to medical complications (Comment) Patient left: in bed;with call bell/phone within reach;with bed alarm set;with family/visitor present Nurse Communication: Mobility status PT Visit Diagnosis: Unsteadiness on feet (R26.81);Muscle weakness (generalized) (M62.81)    Time: 2947-6546 PT Time Calculation (min) (ACUTE ONLY): 31 min   Charges:   PT Evaluation $PT Eval Moderate Complexity: 1 Mod PT Treatments $Gait Training: 8-22 mins       Ramond Dial 04/29/2020, 3:53 PM  Mee Hives, PT MS Acute Rehab Dept. Number: [address]

## 2020-04-29 NOTE — Progress Notes (Addendum)
Progress Note    Christine Gay  IFO:[id number] DOB: July 18, 1941  DOA: 04/27/2020 PCP: Seward Carol, MD    Brief Narrative:     Medical records reviewed and are as summarized below:  Christine Gay is an 79 y.o. female with past medical history of coronary artery disease status post CABG (S/P multiple interventions,last cardiac cath 02/2018), hyperlipidemia, hypertension, paroxysmal atrial fibrillation, complete heart block status post pacemaker placement, diastolic congestive heart failure (Echo 02/2018 with EF 67-67% and diastolic dysfunction) who presents to [facility] emergency department complaints of chest discomfort. S/p cath and PCI.  Also found to have suspected ischemic colitis.   Assessment/Plan:   Principal Problem:   NSTEMI (non-ST elevated myocardial infarction) ([place]) Active Problems:   Hypothyroidism   Chronic diastolic CHF (congestive heart failure) (HCC)   GERD (gastroesophageal reflux disease)   Type 2 diabetes mellitus without complication, without long-term current use of insulin (HCC)   AF (paroxysmal atrial fibrillation) (HCC)   Mixed diabetic hyperlipidemia associated with type 2 diabetes mellitus ([place])   NSTEMI (non-ST elevated myocardial infarction) [facility])  Patient presenting with several episodes of substantial chest discomfort in the past 24 hours, with typical features concerning for acute coronary syndrome.  Serial cardiac enzymes reveal a dramatic elevation in troponin, initially at 62 with second troponin being 2371 seeming to confirm a NSTEMI in this patient.  S/p cardiac cath: Suspect SVG-OM2 lesion is the culprit for NSTEMI, plan intervention with Dr. Burt Knack. Cath yesterday with tight stenosis ostial SVG-OM2, now s/p PCI.  - ASA x 1 month - Plavix x 6 months minimum - Resume warfarin - Continue Zetia/Crestor 40.  LDL still elevated, would refer to lipid clinic for PCSK9 inhibitor as outpatient.  - Cardiac rehab.    Abdominal  pain due ischemic colitis  CT scan shows: Caliber change in the colon at the splenic flexure with mild surrounding stranding. Findings could reflect colitis related to watershed ischemia given patient history. Consider close attention on follow-up and correlation with lactate  GI Consult appreciated: most likely ischemic colitis and to proceed with cath- no further intervention unless she develops hematochezia or other signs suggesting worsening ischemic colitis  Hypothyroidism  Continue home regimen of Synthroid  TSH actually may need to consider increase  Chronic diastolic CHF (congestive heart failure) (HCC)  No evidence of cardiogenic volume overload  Lasix per cards  Type 2 diabetes mellitus without complication, without long-term current use of insulin ([place])  Accu-Cheks every 6 hours while n.p.o. with sliding scale insulin  Hemoglobin A1c: 5.7  Holding home regimen of oral hypoglycemics  AF (paroxysmal atrial fibrillation) ([place])  Monitoring patient on telemetry  Temporarily holding warfarin as we place the patient on a heparin infusion in preparation for cardiac catheterization per cardiology's recommendations.  GERD (gastroesophageal reflux disease)  Protonix 40 mg IV every 24 hours  Mixed diabetic hyperlipidemia associated with type 2 diabetes mellitus (HCC)  Continue on Crestor and Zetia  LDL 92  Hypokalemia -replete  obesity Body mass index is 33.9 kg/m.   Family Communication/Anticipated D/C date and plan/Code Status   DVT prophylaxis: heparin gtt Code Status: Full Code.  Disposition Plan: Status is: Inpatient  Remains inpatient appropriate because:Inpatient level of care appropriate due to severity of illness   Dispo: The patient is from: Home              Anticipated d/c is to: Home  Anticipated d/c date is: 3 days              Patient currently is not medically stable to d/c.         Medical Consultants:     Cardiology  GI     Subjective:   C/o right shoulder pain  Objective:    Vitals:   04/28/20 2026 04/29/20 0003 04/29/20 0424 04/29/20 0442  BP: (!) 137/56 122/73 (!) 100/50   Pulse: 60 60 60   Resp: 19 13 20    Temp:  97.9 F (36.6 C) 98.6 F (37 C)   TempSrc:  Oral Oral   SpO2: 95% 97% 93%   Weight:    78.7 kg  Height:        Intake/Output Summary (Last 24 hours) at 04/29/2020 0804 Last data filed at 04/29/2020 0400 Gross per 24 hour  Intake 240.85 ml  Output 1400 ml  Net -1159.15 ml   Filed Weights   04/28/20 1756 04/29/20 0442  Weight: 82 kg 78.7 kg    Exam:  General: Appearance:    Obese female in no acute distress   Abdomen mildly tender to palpation  Lungs:     respirations unlabored  Heart:    Normal heart rate.   MS:   All extremities are intact.   Neurologic:   Awake, alert.  No apparent focal neurological defect.     Data Reviewed:   I have personally reviewed following labs and imaging studies:  Labs: Labs show the following:   Basic Metabolic Panel: Recent Labs  Lab 04/27/20 1705 04/27/20 1705 04/28/20 0525 04/29/20 0546  NA 140  --  140 137  K 3.6   < > 3.5 3.4*  CL 104  --  104 103  CO2 26  --  27 25  GLUCOSE 112*  --  89 153*  BUN 15  --  14 12  CREATININE 1.20*  --  1.11* 1.07*  CALCIUM 8.6*  --  8.5* 8.3*  MG  --   --  2.2  --    < > = values in this interval not displayed.   GFR Estimated Creatinine Clearance: 39.6 mL/min (A) (by C-G formula based on SCr of 1.07 mg/dL (H)). Liver Function Tests: Recent Labs  Lab 04/28/20 0525 04/29/20 0546  AST 44* 40  ALT 26 26  ALKPHOS 60 61  BILITOT 0.6 0.7  PROT 6.7 6.3*  ALBUMIN 3.4* 3.1*   No results for input(s): LIPASE, AMYLASE in the last 168 hours. No results for input(s): AMMONIA in the last 168 hours. Coagulation profile Recent Labs  Lab 04/28/20 0529 04/29/20 0546  INR 1.6* 1.5*    CBC: Recent Labs  Lab 04/27/20 1705 04/28/20 0728 04/29/20 0546  WBC  6.2  --  6.6  HGB 11.7* 11.7* 11.1*  HCT 36.1 35.3* 33.7*  MCV 101.1*  --  98.8  PLT 168  --  157   Cardiac Enzymes: No results for input(s): CKTOTAL, CKMB, CKMBINDEX, TROPONINI in the last 168 hours. BNP (last 3 results) No results for input(s): PROBNP in the last 8760 hours. CBG: Recent Labs  Lab 04/28/20 1721 04/28/20 1820 04/28/20 2117 04/29/20 0030 04/29/20 0549  GLUCAP 60* 109* 170* 96 128*   D-Dimer: No results for input(s): DDIMER in the last 72 hours. Hgb A1c: Recent Labs    04/28/20 0528  HGBA1C 5.7*   Lipid Profile: Recent Labs    04/28/20 0525  CHOL 166  HDL 57  LDLCALC  92  TRIG 84  CHOLHDL 2.9   Thyroid function studies: Recent Labs    04/28/20 0526  TSH 7.443*   Anemia work up: No results for input(s): VITAMINB12, FOLATE, FERRITIN, TIBC, IRON, RETICCTPCT in the last 72 hours. Sepsis Labs: Recent Labs  Lab 04/27/20 1705 04/28/20 1051 04/28/20 1753 04/29/20 0546  WBC 6.2  --   --  6.6  LATICACIDVEN  --  0.9 1.4  --     Microbiology Recent Results (from the past 240 hour(s))  SARS Coronavirus 2 by RT PCR (hospital order, performed in [facility] hospital lab) Nasopharyngeal Nasopharyngeal Swab     Status: None   Collection Time: 04/28/20  3:21 AM   Specimen: Nasopharyngeal Swab  Result Value Ref Range Status   SARS Coronavirus 2 NEGATIVE NEGATIVE Final    Comment: (NOTE) SARS-CoV-2 target nucleic acids are NOT DETECTED.  The SARS-CoV-2 RNA is generally detectable in upper and lower respiratory specimens during the acute phase of infection. The lowest concentration of SARS-CoV-2 viral copies this assay can detect is 250 copies / mL. A negative result does not preclude SARS-CoV-2 infection and should not be used as the sole basis for treatment or other patient management decisions.  A negative result may occur with improper specimen collection / handling, submission of specimen other than nasopharyngeal swab, presence of viral  mutation(s) within the areas targeted by this assay, and inadequate number of viral copies (<250 copies / mL). A negative result must be combined with clinical observations, patient history, and epidemiological information.  Fact Sheet for Patients:   StrictlyIdeas.no  Fact Sheet for Healthcare Providers: BankingDealers.co.za  This test is not yet approved or  cleared by the [place] FDA and has been authorized for detection and/or diagnosis of SARS-CoV-2 by FDA under an Emergency Use Authorization (EUA).  This EUA will remain in effect (meaning this test can be used) for the duration of the COVID-19 declaration under Section 564(b)(1) of the Act, 21 U.S.C. section 360bbb-3(b)(1), unless the authorization is terminated or revoked sooner.  Performed at [facility], [place] [address], [place],  [address]     Procedures and diagnostic studies:  CT ABDOMEN PELVIS WO CONTRAST  Result Date: 04/28/2020 CLINICAL DATA:  Abdominal pain, acute abdominal pain EXAM: CT ABDOMEN AND PELVIS WITHOUT CONTRAST TECHNIQUE: Multidetector CT imaging of the abdomen and pelvis was performed following the standard protocol without IV contrast. COMPARISON:  December 19, 2010 FINDINGS: Lower chest: No consolidation. No pleural effusion. Cardiac pacer device partially imaged in an enlarged heart with areas of coronary artery calcification and potentially prior percutaneous coronary intervention. Status post median sternotomy. Hepatobiliary: No focal, suspicious hepatic lesion. Post cholecystectomy. No gross biliary duct dilation. Pancreas: Pancreas is normal without contour abnormality or gross ductal dilation. No peripancreatic inflammation. Spleen: Spleen normal in size and contour. Adrenals/Urinary Tract: Hip down adrenal glands are normal. Renal cyst on the RIGHT with enlargement measuring 4.4 x 3.4 cm showing homogeneous low-density. No nephrolithiasis.  No hydronephrosis. No ureteral calculi. Stomach/Bowel: Mild distension of the stomach. No signs of small bowel obstruction or acute small bowel process. Midline position of the cecum without signs of mesenteric twist or stranding. Mild distension of the cecum to approximately 6 cm. Perhaps mild stranding at the splenic flexure with caliber change at this location. Appendix not visualized.  No secondary signs of appendicitis. Vascular/Lymphatic: Vascular structures with abundant atheromatous plaque. Tracking into branch vessels in the abdomen. No adenopathy in the retroperitoneum though there is  a mildly enlarged retrocrural lymph node approximately 9 mm. Scattered celiac nodes. As compared to the previous CT of the chest from 2020 these are unchanged. No pelvic lymphadenopathy. Reproductive: Uri's is unremarkable.  No adnexal mass. Other: No free air or ascites. Musculoskeletal: Spinal degenerative changes. Signs of prior LEFT rib fracture. No acute bone finding. IMPRESSION: 1. Caliber change in the colon at the splenic flexure with mild surrounding stranding. Findings could reflect colitis related to watershed ischemia given patient history. Consider close attention on follow-up and correlation with lactate. No masslike features at this location consider colonoscopic assessment as warranted based on follow-up evaluations. 2. Midline position of the cecum without mesenteric twist. Findings could be seen in the setting of cecal bascule or mobile cecum but are not associated with acute colonic features at this time. This could be a cause for intermittent abdominal pain. 3. Enlarging RIGHT renal cyst 4. Cardiomegaly with areas of coronary artery calcification and potentially prior percutaneous coronary intervention. 5. Aortic atherosclerosis. Aortic Atherosclerosis (ICD10-I70.0). Electronically Signed   By: Zetta Bills M.D.   On: 04/28/2020 08:35   DG Chest 2 View  Result Date: 04/27/2020 CLINICAL DATA:  Chest  pain EXAM: CHEST - 2 VIEW COMPARISON:  Chest x-ray dated 04/04/2019 FINDINGS: Stable cardiomegaly. LEFT chest wall pacemaker/ICD hardware appears stable. Lungs are clear. No pleural effusion or pneumothorax is seen. Mild degenerative spurring in the midthoracic spine. No acute or suspicious osseous finding. Aortic atherosclerosis 2 IMPRESSION: No active cardiopulmonary disease. No evidence of pneumonia or pulmonary edema. Electronically Signed   By: Franki Cabot M.D.   On: 04/27/2020 17:28   CARDIAC CATHETERIZATION  Result Date: 04/28/2020 1. Severe multivessel CAD with continued patency of the LIMA graft-LAD and severe stenosis of the SVG-OM (see Dr Claris Gladden full report) 2. Successful PCI of the SVG-OM with a 3.0x20 mm Synergy DES Recommend:  Resume warfarin today per pharmacy (INR can drift up without post-procedural heparin)  ASA 81 mg x 30 days  plavix 75 mg x 6 months minimum, consider longer term Rx in this patient with extensive SVG disease  OK for discharge tomorrow am if no complications arise  CARDIAC CATHETERIZATION  Result Date: 04/28/2020 1. Patent LIMA-LAD. 2. Known occluded SVG-OM1 and RCA. 3. SVG-OM2 with 95% ostial stenosis. Suspect SVG-OM2 lesion is the culprit for NSTEMI, plan intervention with Dr. Burt Knack.  Will plan intervention.  Will use Plavix as she will need coumadin going forward.   ECHOCARDIOGRAM COMPLETE  Result Date: 04/28/2020    ECHOCARDIOGRAM REPORT   Patient Name:   KANEISHA ELLENBERGER Date of Exam: 04/28/2020 Medical Rec #:  [id number]        Height:       60.0 in Accession #:    [id number]       Weight:       176.0 lb Date of Birth:  1940/12/01         BSA:          1.768 m Patient Age:    2 years         BP:           163/69 mmHg Patient Gender: F                HR:           82 bpm. Exam Location:  Inpatient Procedure: 2D Echo Indications:    NSTEMI I21.4  History:        Patient has prior history of Echocardiogram examinations,  most                 recent 03/20/2018. CHF,  CAD and Acute MI, Prior CABG and                 Pacemaker, Arrythmias:Complete heart block and LBBB; Risk                 Factors:Hypertension, Diabetes and Dyslipidemia. Severe fatigue                 and dyspnea. History of Afib.  Sonographer:    Darlina Sicilian RDCS Referring Phys: [id number] [place]  1. Left ventricular ejection fraction, by estimation, is 50 to 55%. The left ventricle has low normal function. The left ventricle demonstrates regional wall motion abnormalities (see scoring diagram/findings for description). There is mild left ventricular hypertrophy. Left ventricular diastolic parameters are consistent with Grade I diastolic dysfunction (impaired relaxation).  2. Right ventricular systolic function is mildly reduced. The right ventricular size is normal. There is normal pulmonary artery systolic pressure. The estimated right ventricular systolic pressure is 31.4 mmHg.  3. The mitral valve is normal in structure. Mild mitral valve regurgitation. No evidence of mitral stenosis.  4. The aortic valve is normal in structure. Aortic valve regurgitation is mild to moderate. No aortic stenosis is present.  5. The inferior vena cava is normal in size with greater than 50% respiratory variability, suggesting right atrial pressure of 3 mmHg. FINDINGS  Left Ventricle: Left ventricular ejection fraction, by estimation, is 50 to 55%. The left ventricle has low normal function. The left ventricle demonstrates regional wall motion abnormalities. The left ventricular internal cavity size was normal in size. There is mild left ventricular hypertrophy. Abnormal (paradoxical) septal motion, consistent with left bundle branch block. Left ventricular diastolic parameters are consistent with Grade I diastolic dysfunction (impaired relaxation).  LV Wall Scoring: The antero-lateral wall and posterior wall are hypokinetic. Right Ventricle: The right ventricular size is normal. No increase in right  ventricular wall thickness. Right ventricular systolic function is mildly reduced. There is normal pulmonary artery systolic pressure. The tricuspid regurgitant velocity is 2.41 m/s, and with an assumed right atrial pressure of 3 mmHg, the estimated right ventricular systolic pressure is 97.0 mmHg. Left Atrium: Left atrial size was normal in size. Right Atrium: Right atrial size was normal in size. Pericardium: There is no evidence of pericardial effusion. Presence of pericardial fat pad. Mitral Valve: The mitral valve is normal in structure. Normal mobility of the mitral valve leaflets. Mild mitral annular calcification. Mild mitral valve regurgitation. No evidence of mitral valve stenosis. Tricuspid Valve: The tricuspid valve is normal in structure. Tricuspid valve regurgitation is mild . No evidence of tricuspid stenosis. Aortic Valve: Vena contracta of AR 0.4 cm. The aortic valve is normal in structure. Aortic valve regurgitation is mild to moderate. Aortic regurgitation PHT measures 592 msec. No aortic stenosis is present. Pulmonic Valve: The pulmonic valve was not well visualized. Pulmonic valve regurgitation is not visualized. No evidence of pulmonic stenosis. Aorta: The aortic root is normal in size and structure. Venous: The inferior vena cava is normal in size with greater than 50% respiratory variability, suggesting right atrial pressure of 3 mmHg. IAS/Shunts: The interatrial septum was not well visualized.  LEFT VENTRICLE PLAX 2D LVIDd:         5.80 cm      Diastology LVIDs:         2.80 cm  LV e' lateral:   2.70 cm/s LV PW:         1.20 cm      LV E/e' lateral: 19.7 LV IVS:        1.40 cm      LV e' medial:    3.15 cm/s LVOT diam:     1.85 cm      LV E/e' medial:  16.9 LV SV:         48 LV SV Index:   27 LVOT Area:     2.69 cm  LV Volumes (MOD) LV vol d, MOD A2C: 98.1 ml LV vol d, MOD A4C: 113.0 ml LV vol s, MOD A2C: 42.5 ml LV vol s, MOD A4C: 66.7 ml LV SV MOD A2C:     55.6 ml LV SV MOD A4C:      113.0 ml LV SV MOD BP:      53.8 ml RIGHT VENTRICLE RV S prime:     9.90 cm/s TAPSE (M-mode): 1.4 cm LEFT ATRIUM             Index       RIGHT ATRIUM           Index LA diam:        3.30 cm 1.87 cm/m  RA Area:     11.10 cm LA Vol (A2C):   53.6 ml 30.32 ml/m RA Volume:   20.50 ml  11.60 ml/m LA Vol (A4C):   48.6 ml 27.49 ml/m LA Biplane Vol: 50.8 ml 28.73 ml/m  AORTIC VALVE LVOT Vmax:   89.40 cm/s LVOT Vmean:  53.800 cm/s LVOT VTI:    0.178 m AI PHT:      592 msec  AORTA Ao Root diam: 3.00 cm MITRAL VALVE               TRICUSPID VALVE MV Area (PHT): 2.26 cm    TR Peak grad:   23.2 mmHg MV Decel Time: 335 msec    TR Vmax:        241.00 cm/s MV E velocity: 53.10 cm/s MV A velocity: 81.00 cm/s  SHUNTS MV E/A ratio:  0.66        Systemic VTI:  0.18 m                            Systemic Diam: 1.85 cm Cherlynn Kaiser MD Electronically signed by Cherlynn Kaiser MD Signature Date/Time: 04/28/2020/3:26:33 PM    Final     Medications:   . carvedilol  3.125 mg Oral BID WC  . clopidogrel  75 mg Oral Q breakfast  . cycloSPORINE  1 drop Both Eyes BID  . diclofenac Sodium  2 g Topical TID AC & HS  . ezetimibe  10 mg Oral QPM  . furosemide  60 mg Oral BID  . insulin aspart  0-15 Units Subcutaneous Q6H  . isosorbide mononitrate  60 mg Oral BID  . levothyroxine  100 mcg Oral QAC breakfast  . pantoprazole (PROTONIX) IV  40 mg Intravenous Daily  . phenytoin  200 mg Oral Daily   And  . phenytoin  100 mg Oral QHS  . potassium chloride  40 mEq Oral Once  . rosuvastatin  40 mg Oral QPM  . sodium chloride flush  3 mL Intravenous Once  . sodium chloride flush  3 mL Intravenous Q12H  . timolol  1 drop Both Eyes BID  . Warfarin - Pharmacist Dosing Inpatient  Does not apply q1600   Continuous Infusions: . sodium chloride       LOS: 1 day   Geradine Girt  [facility]   How to contact the [facility] Attending or Consulting provider [place] or covering provider during after hours [place], for this patient?    1. Check the care team in [facility] and look for a) attending/consulting TR[facility] provider listed and b) the [facility] team listed 2. Log into [email or web address] and use [place]'s universal password to access. If you do not have the password, please contact the hospital operator. 3. Locate the [facility] provider you are looking for under [facility] and page to a number that you can be directly reached. 4. If you still have difficulty reaching the provider, please page the [facility] (Director on Call) for the Hospitalists listed on amion for assistance.  04/29/2020, 8:04 AM

## 2020-04-29 NOTE — Progress Notes (Signed)
ANTICOAGULATION CONSULT NOTE - Follow Up Consult  Pharmacy Consult for Warfarin Indication: Atrial Fibrillation  Allergies  Allergen Reactions   Meperidine Hcl Other (See Comments)    Demerol - mental status changes     Nitroglycerin Other (See Comments)    Patch and Cream only - blisters   Morphine Palpitations    Patient Measurements: Total Body Weight: 79.8 kg Height: 60 inches Heparin Dosing Weight: 65 kg  Vital Signs: Temp: 98.1 F (36.7 C) (08/03 1100) Temp Source: Oral (08/03 1100) BP: 130/116 (08/03 1100) Pulse Rate: 62 (08/03 1100)  Labs: Recent Labs    04/27/20 1705 04/27/20 1705 04/28/20 0104 04/28/20 0525 04/28/20 0529 04/28/20 0728 04/28/20 1051 04/28/20 1753 04/29/20 0546  HGB 11.7*   < >  --   --   --  11.7*  --   --  11.1*  HCT 36.1  --   --   --   --  35.3*  --   --  33.7*  PLT 168  --   --   --   --   --   --   --  157  APTT  --   --   --   --  29  --   --   --   --   LABPROT  --   --   --   --  18.0*  --   --   --  17.3*  INR  --   --   --   --  1.6*  --   --   --  1.5*  CREATININE 1.20*  --   --  1.11*  --   --   --   --  1.07*  TROPONINIHS 62*   < >   < >  --   --  2,363* 2,273* 3,629*  --    < > = values in this interval not displayed.    Estimated Creatinine Clearance: 39.6 mL/min (A) (by C-G formula based on SCr of 1.07 mg/dL (H)).   Medical History: Past Medical History:  Diagnosis Date   Anemia    a. Noted on labs 04/2013.   Anginal pain ([place])    Arthritis    "legs" (06/10/2014)   CAD (coronary artery disease)    a. s/p CABG 1992, b. s/p PCI several years ago; c. LHC 11/08: L-LAD ok, S-OM1 ok, S-OM2 CTO, prox to mid CFX 70% (unchanged), RCA non-dominant, occluded;  d.  Echo 2/11: EF 55%, mod LVH, Ao sclerosis;  e. Echo 6/14: mild LVH, mild focal basal sept hypertrophy, EF 55-65%, inf HK, Mild AI, mild MR, mild BAE;  f. Myoview 7/14: int risk-> cath 04/2013 s/p PTCA/DES to prox Cx 05/04/13.   Chronic diastolic CHF (congestive  heart failure) (HCC)    GERD (gastroesophageal reflux disease)    Glaucoma    High cholesterol    Hypertension    Hypokalemia 02/2017   Hypothyroidism    LBBB (left bundle branch block)    chronic   Myocardial infarction ([place]) 03/1991 X 2; 07/1991   Obesity    Seizures ([place])    "I'm on Dilantin" (06/10/2014)   Sickle cell trait (HCC)    Type II diabetes mellitus ([place])     Assessment: Pharmacy was consulted to dose heparin for 79 yr old female admitted with chest pain and elevated troponins; pt has hx of atrial fibrillation for which she takes warfarin PTA (INR subtherapeutic at 1.6 on admission). Admission H/H 11.7/36.1, platelets 168.  Pt is S/P cardiac cath 8/2. Pharmacy is consulted to resume pt's home warfarin today; per cath report.   Pt's home warfarin regimen: 5 mg PO daily on MWF 7.5 mg PO daily on TTTS (per med rec, pt took last dose at 04/27/20 at 1700; INR subtherapeutic on admission; INR was therapeutic at 2.1 at 03/20/20 coag clinic visit)  INR down slightly to 1.5 this morning. CBC stable. No bleeding noted.    Goal of Therapy:  INR: 2.0-3.0 Monitor platelets by anticoagulation protocol: Yes   Plan:  Warfarin 7.5 mg PO X 1 this evening Monitor daily INR  Erin Hearing PharmD., BCPS Clinical Pharmacist 04/29/2020 1:54 PM

## 2020-04-29 NOTE — Progress Notes (Signed)
CARDIAC REHAB PHASE I   PRE:  Rate/Rhythm: 62 pacing    BP: sitting 86/69    SaO2: 97 RA  MODE:  Ambulation: 180 ft   POST:  Rate/Rhythm: 97 pacing    BP: sitting 96/78     SaO2: 95 RA  Pt able to walk in hall with RW. Slow, fairly steady. Trouble turning at times, got feet outside RW. She normally uses rollator at home. Pt denied c/o walking however quite fatigued after sitting.  Discussed with her conserving energy, she sts she has a habit of doing too much. To recliner. Discussed MI, stent, Plavix, low sodium, daily wts, NTG, and CRPII. Pt receptive. She only weighs once a week so encouraged daily. Will refer to [place], she is thinking about program.  [phone]  [place], ACSM 04/29/2020 11:20 AM

## 2020-04-29 NOTE — Progress Notes (Deleted)
TR band removed from right radial. Site is a level 0. Applied gauze and Tegaderm to site. Educated pt to leave in place for 24 hours. Pt verbalizes understanding.

## 2020-04-30 LAB — CBC
HCT: 34.1 % — ABNORMAL LOW (ref 36.0–46.0)
Hemoglobin: 11.2 g/dL — ABNORMAL LOW (ref 12.0–15.0)
MCH: 32.5 pg (ref 26.0–34.0)
MCHC: 32.8 g/dL (ref 30.0–36.0)
MCV: 98.8 fL (ref 80.0–100.0)
Platelets: 150 10*3/uL (ref 150–400)
RBC: 3.45 MIL/uL — ABNORMAL LOW (ref 3.87–5.11)
RDW: 11.9 % (ref 11.5–15.5)
WBC: 6.9 10*3/uL (ref 4.0–10.5)
nRBC: 0 % (ref 0.0–0.2)

## 2020-04-30 LAB — BASIC METABOLIC PANEL
Anion gap: 12 (ref 5–15)
BUN: 10 mg/dL (ref 8–23)
CO2: 24 mmol/L (ref 22–32)
Calcium: 8.6 mg/dL — ABNORMAL LOW (ref 8.9–10.3)
Chloride: 104 mmol/L (ref 98–111)
Creatinine, Ser: 1.18 mg/dL — ABNORMAL HIGH (ref 0.44–1.00)
GFR calc Af Amer: 51 mL/min — ABNORMAL LOW (ref >60)
GFR calc non Af Amer: 44 mL/min — ABNORMAL LOW (ref >60)
Glucose, Bld: 122 mg/dL — ABNORMAL HIGH (ref 70–99)
Potassium: 3.4 mmol/L — ABNORMAL LOW (ref 3.5–5.1)
Sodium: 140 mmol/L (ref 135–145)

## 2020-04-30 LAB — PROTIME-INR
INR: 1.6 — ABNORMAL HIGH (ref 0.8–1.2)
Prothrombin Time: 18.4 seconds — ABNORMAL HIGH (ref 11.4–15.2)

## 2020-04-30 LAB — TROPONIN I (HIGH SENSITIVITY): Troponin I (High Sensitivity): 2059 ng/L (ref <18)

## 2020-04-30 LAB — GLUCOSE, CAPILLARY
Glucose-Capillary: 84 mg/dL (ref 70–99)
Glucose-Capillary: 123 mg/dL — ABNORMAL HIGH (ref 70–99)

## 2020-04-30 MED ORDER — WARFARIN SODIUM 7.5 MG PO TABS: 7.5000 mg | ORAL_TABLET | Freq: Once | ORAL | Status: DC

## 2020-04-30 MED ORDER — POTASSIUM CHLORIDE CRYS ER 20 MEQ PO TBCR: 40.0000 meq | EXTENDED_RELEASE_TABLET | Freq: Once | ORAL | Status: AC

## 2020-04-30 MED ORDER — CLOPIDOGREL BISULFATE 75 MG PO TABS: 75.0000 mg | ORAL_TABLET | Freq: Every day | ORAL | 0 refills | Status: DC

## 2020-04-30 MED ORDER — ASPIRIN 81 MG PO CHEW: 81.0000 mg | CHEWABLE_TABLET | Freq: Every day | ORAL | 0 refills | Status: AC

## 2020-04-30 MED ORDER — HYDROCODONE-ACETAMINOPHEN 5-325 MG PO TABS
1.0000 | ORAL_TABLET | Freq: Once | ORAL | Status: AC
  Administered 2020-04-30: 1 via ORAL
  Filled 2020-04-30: qty 1

## 2020-04-30 MED ORDER — POTASSIUM CHLORIDE CRYS ER 20 MEQ PO TBCR
40.0000 meq | EXTENDED_RELEASE_TABLET | Freq: Every day | ORAL | Status: DC
  Administered 2020-04-30: 40 meq via ORAL
  Filled 2020-04-30: qty 2

## 2020-04-30 NOTE — Progress Notes (Signed)
PT Cancellation Note  Patient Details Name: Christine Gay MRN: [id number] DOB: July 07, 1941   Cancelled Treatment:    Reason Eval/Treat Not Completed: Other (comment).  Discharge is pending from hosp and is actively preparing to get home.  HHPT is following up.   Ramond Dial 04/30/2020, 12:38 PM   Mee Hives, PT MS Acute Rehab Dept. Number: [place] [facility] [place]

## 2020-04-30 NOTE — Discharge Summary (Signed)
Physician Discharge Summary  Kelechi Astarita Raske MPN:[id number] DOB: 09/22/1941 DOA: 04/27/2020  PCP: Seward Carol, MD  Admit date: 04/27/2020 Discharge date: 04/30/2020  Admitted From: Home Disposition: Home  Recommendations for Outpatient Follow-up:  1. Follow up with PCP in 1-2 weeks 2. Follow-up with cardiology per their instructions 3. Please obtain BMP/CBC in one week 4. Please follow up with your PCP on the following pending results: Unresulted Labs (From admission, onward) Comment          Start     Ordered   04/29/20 0500  Protime-INR  Daily,   R      04/28/20 [place]: Yes Equipment/Devices: None  Discharge Condition: Stable CODE STATUS: Full code Diet recommendation: Cardiac/low-sodium  Subjective: Patient seen and examined. Only complaint is right shoulder pain which is chronic. No other complaint. She expressed her desire to go home now that she has been cleared by cardiology.  Brief/Interim Summary: Christine Gay is an 79 y.o. female with past medical history of coronary artery disease status post CABG(S/Pmultiple interventions,last cardiac cath6/2019), hyperlipidemia, hypertension, paroxysmal atrial fibrillation, complete heart block status post pacemaker placement, diastolic congestive heart failure(Echo 02/2018 with EF 00-86% and diastolic dysfunction)who presented to [facility] emergency department complaints of chest discomfort. She was hemodynamically stable upon presentation to ED. Patient underwent multiple cardiac enzymes with the first troponin being 62 and second troponin being 2379 concerning for developing an NSTEMI. Cardiology was consulted and patient was admitted under hospitalist service. S/p cath and PCI. S/p cardiac cath: Suspect SVG-OM2 lesion is the culprit for NSTEMI, plan intervention with Dr. Burt Knack. Cath yesterday with tight stenosis ostial SVG-OM2, now s/p PCI. Patient was already on warfarin. Cardiology recommended  1 month of aspirin low-dose along with Plavix for 6 months. Continue Zetia/Crestor. LDL was elevated. Cardiology will refer her to the lipid clinic for consideration of PCSK9 inhibitor.  [name] rest of the medical issues remained stable however patient then complained of abdominal pain which prompted further evaluation with CT of the abdomen which showed caliber change in the colon at the splenic flexure with mild surrounding stranding, findings were suspected to be due to ischemic colitis. GI was consulted who also agreed that patient likely has ischemic colitis however patient's symptoms are mainly minimal and she was tolerating solid diet so GI did not recommend any further evaluation. Please note that I saw this patient today for the first time. During my evaluation, patient did not complain of any abdominal pain, any GI symptoms. Her only complaint was right shoulder pain. She told me that she sees Dr. Marlou Sa of orthopedics as outpatient and she has had steroid injections in the past, last one being in 2019. Her pain was bearable. She wanted to go home. She told me that she will call Dr. Marlou Sa to get an appointment. Cardiology saw her and they have cleared her as well. She is being discharged in stable condition.  Discharge Diagnoses:  Principal Problem:   NSTEMI (non-ST elevated myocardial infarction) ([place]) Active Problems:   Hypothyroidism   Chronic diastolic CHF (congestive heart failure) (HCC)   GERD (gastroesophageal reflux disease)   Type 2 diabetes mellitus without complication, without long-term current use of insulin (HCC)   AF (paroxysmal atrial fibrillation) ([place])   Mixed diabetic hyperlipidemia associated with type 2 diabetes mellitus [facility])    Discharge Instructions  Discharge Instructions    Amb Referral to Cardiac Rehabilitation   Complete by:  As directed    Diagnosis:  Coronary Stents NSTEMI PTCA     After initial evaluation and assessments completed: Virtual Based Care  may be provided alone or in conjunction with Phase 2 Cardiac Rehab based on patient barriers.: Yes     Allergies as of 04/30/2020      Reactions   Meperidine Hcl Other (See Comments)   Demerol - mental status changes    Nitroglycerin Other (See Comments)   Patch and Cream only - blisters   Morphine Palpitations      Medication List    STOP taking these medications   diclofenac 1.3 % Ptch Commonly known as: FLECTOR   enoxaparin 120 MG/0.8ML injection Commonly known as: LOVENOX     TAKE these medications   Accu-Chek Aviva Plus w/Device Kit   Accu-Chek Softclix Lancets lancets   acetaminophen 325 MG tablet Commonly known as: TYLENOL Take 1-2 tablets (325-650 mg total) by mouth every 4 (four) hours as needed for mild pain.   aspirin 81 MG chewable tablet Chew 1 tablet (81 mg total) by mouth daily. Start taking on: May 01, 2020   CALCIUM PO Take 1 tablet by mouth daily.   carvedilol 6.25 MG tablet Commonly known as: COREG TAKE 1/2 TABLET BY MOUTH 2 TIMES A DAY WITH A MEAL. What changed: See the new instructions.   clopidogrel 75 MG tablet Commonly known as: PLAVIX Take 1 tablet (75 mg total) by mouth daily with breakfast. Start taking on: May 01, 2020   cycloSPORINE 0.05 % ophthalmic emulsion Commonly known as: RESTASIS Place 1 drop into both eyes 2 (two) times daily.   ezetimibe 10 MG tablet Commonly known as: ZETIA TAKE 1 TABLET BY MOUTH DAILY. What changed: when to take this   ferrous sulfate 325 (65 FE) MG tablet Take 325 mg by mouth daily with breakfast.   furosemide 40 MG tablet Commonly known as: LASIX TAKE 1 AND 1/2 TABLETS BY MOUTH 2 TIMES DAILY. What changed: See the new instructions.   glucose blood test strip Test blood glucose three times daily.   ibandronate 150 MG tablet Commonly known as: BONIVA Take 150 mg by mouth every 30 (thirty) days. Take in the morning with a full glass of water, on an empty stomach, and do not take anything  else by mouth or lie down for the next 30 min.   isosorbide mononitrate 60 MG 24 hr tablet Commonly known as: IMDUR Take 1 tablet (60 mg total) by mouth 2 (two) times daily.   levothyroxine 100 MCG tablet Commonly known as: SYNTHROID Take 100 mcg by mouth daily before breakfast.   metFORMIN 500 MG 24 hr tablet Commonly known as: GLUCOPHAGE-XR Take 500 mg by mouth 2 (two) times daily.   multivitamin with minerals Tabs tablet Take 1 tablet by mouth daily.   nitroGLYCERIN 0.4 MG SL tablet Commonly known as: Nitrostat PLACE 1 TABLET (0.4 MG TOTAL) UNDER THE TONGUE EVERY 5 (FIVE) MINUTES AS NEEDED FOR CHEST PAIN. What changed:   how much to take  how to take this  when to take this  reasons to take this  additional instructions   pantoprazole 40 MG tablet Commonly known as: PROTONIX TAKE 1 TABLET BY MOUTH DAILY.   phenytoin 100 MG ER capsule Commonly known as: DILANTIN Take 100-200 mg by mouth See admin instructions. 200 mg in the morning and 100 mg in the evening   potassium chloride SA 20 MEQ tablet Commonly known as: KLOR-CON TAKE 1 TABLET BY MOUTH  2 TIMES DAILY.   rosuvastatin 40 MG tablet Commonly known as: CRESTOR Take 1 tablet (40 mg total) by mouth daily. What changed: when to take this   timolol 0.5 % ophthalmic solution Commonly known as: TIMOPTIC Place 1 drop into both eyes 2 (two) times daily.   warfarin 5 MG tablet Commonly known as: COUMADIN Take as directed. If you are unsure how to take this medication, talk to your nurse or doctor. Original instructions: Take 1-1.5 tablets daily or as directed by Anticoagulation Clinic. What changed:   how much to take  how to take this  when to take this  additional instructions       Follow-up Information    [place] Follow up on 05/12/2020.   Specialty: Cardiology Why: at 10:30  Contact information: [address] [id number] [place] [phone]       [place] [facility] Follow up on 05/05/2020.   Specialty: Cardiology Why: at  Contact information: [address] [place] [phone]       Seward Carol, MD Follow up in 1 week(s).   Specialty: Internal Medicine Contact information: [address] [place] [address] [phone]        Larey Dresser, MD .   Specialty: Cardiology Contact information: [phone] [address] [place] [address] [phone]              Allergies  Allergen Reactions  . Meperidine Hcl Other (See Comments)    Demerol - mental status changes    . Nitroglycerin Other (See Comments)    Patch and Cream only - blisters  . Morphine Palpitations    Consultations: Cardiology and GI   Procedures/Studies: CT ABDOMEN PELVIS WO CONTRAST  Result Date: 04/28/2020 CLINICAL DATA:  Abdominal pain, acute abdominal pain EXAM: CT ABDOMEN AND PELVIS WITHOUT CONTRAST TECHNIQUE: Multidetector CT imaging of the abdomen and pelvis was performed following the standard protocol without IV contrast. COMPARISON:  December 19, 2010 FINDINGS: Lower chest: No consolidation. No pleural effusion. Cardiac pacer device partially imaged in an enlarged heart with areas of coronary artery calcification and potentially prior percutaneous coronary intervention. Status post median sternotomy. Hepatobiliary: No focal, suspicious hepatic lesion. Post cholecystectomy. No gross biliary duct dilation. Pancreas: Pancreas is normal without contour abnormality or gross ductal dilation. No peripancreatic inflammation. Spleen: Spleen normal in size and contour. Adrenals/Urinary Tract: Hip down adrenal glands are normal. Renal cyst on the RIGHT with enlargement measuring 4.4 x 3.4 cm showing homogeneous low-density. No nephrolithiasis. No hydronephrosis. No ureteral calculi. Stomach/Bowel: Mild distension of the  stomach. No signs of small bowel obstruction or acute small bowel process. Midline position of the cecum without signs of mesenteric twist or stranding. Mild distension of the cecum to approximately 6 cm. Perhaps mild stranding at the splenic flexure with caliber change at this location. Appendix not visualized.  No secondary signs of appendicitis. Vascular/Lymphatic: Vascular structures with abundant atheromatous plaque. Tracking into branch vessels in the abdomen. No adenopathy in the retroperitoneum though there is a mildly enlarged retrocrural lymph node approximately 9 mm. Scattered celiac nodes. As compared to the previous CT of the chest from 2020 these are unchanged. No pelvic lymphadenopathy. Reproductive: Uri's is unremarkable.  No adnexal mass. Other: No free air or ascites. Musculoskeletal: Spinal degenerative changes. Signs of prior LEFT rib fracture. No acute bone finding. IMPRESSION: 1. Caliber change in  the colon at the splenic flexure with mild surrounding stranding. Findings could reflect colitis related to watershed ischemia given patient history. Consider close attention on follow-up and correlation with lactate. No masslike features at this location consider colonoscopic assessment as warranted based on follow-up evaluations. 2. Midline position of the cecum without mesenteric twist. Findings could be seen in the setting of cecal bascule or mobile cecum but are not associated with acute colonic features at this time. This could be a cause for intermittent abdominal pain. 3. Enlarging RIGHT renal cyst 4. Cardiomegaly with areas of coronary artery calcification and potentially prior percutaneous coronary intervention. 5. Aortic atherosclerosis. Aortic Atherosclerosis (ICD10-I70.0). Electronically Signed   By: Zetta Bills M.D.   On: 04/28/2020 08:35   DG Chest 2 View  Result Date: 04/27/2020 CLINICAL DATA:  Chest pain EXAM: CHEST - 2 VIEW COMPARISON:  Chest x-ray dated 04/04/2019 FINDINGS:  Stable cardiomegaly. LEFT chest wall pacemaker/ICD hardware appears stable. Lungs are clear. No pleural effusion or pneumothorax is seen. Mild degenerative spurring in the midthoracic spine. No acute or suspicious osseous finding. Aortic atherosclerosis 2 IMPRESSION: No active cardiopulmonary disease. No evidence of pneumonia or pulmonary edema. Electronically Signed   By: Franki Cabot M.D.   On: 04/27/2020 17:28   DG Ankle Complete Left  Result Date: 04/24/2020 CLINICAL DATA:  79 year old female with fall and trauma to the left ankle. EXAM: LEFT ANKLE COMPLETE - 3+ VIEW COMPARISON:  None. FINDINGS: There is no acute fracture or dislocation. The bones are osteopenic. The ankle mortise is intact. There is diffuse subcutaneous edema. IMPRESSION: No acute fracture or dislocation. Electronically Signed   By: Anner Crete M.D.   On: 04/24/2020 20:16   CARDIAC CATHETERIZATION  Result Date: 04/28/2020 1. Severe multivessel CAD with continued patency of the LIMA graft-LAD and severe stenosis of the SVG-OM (see Dr Claris Gladden full report) 2. Successful PCI of the SVG-OM with a 3.0x20 mm Synergy DES Recommend:  Resume warfarin today per pharmacy (INR can drift up without post-procedural heparin)  ASA 81 mg x 30 days  plavix 75 mg x 6 months minimum, consider longer term Rx in this patient with extensive SVG disease  OK for discharge tomorrow am if no complications arise  CARDIAC CATHETERIZATION  Result Date: 04/28/2020 1. Patent LIMA-LAD. 2. Known occluded SVG-OM1 and RCA. 3. SVG-OM2 with 95% ostial stenosis. Suspect SVG-OM2 lesion is the culprit for NSTEMI, plan intervention with Dr. Burt Knack.  Will plan intervention.  Will use Plavix as she will need coumadin going forward.   ECHOCARDIOGRAM COMPLETE  Result Date: 04/28/2020    ECHOCARDIOGRAM REPORT   Patient Name:   Christine Gay Date of Exam: 04/28/2020 Medical Rec #:  [id number]        Height:       60.0 in Accession #:    [id number]       Weight:        176.0 lb Date of Birth:  03-Nov-1940         BSA:          1.768 m Patient Age:    79 years         BP:           163/69 mmHg Patient Gender: F                HR:           82 bpm. Exam Location:  Inpatient Procedure: 2D Echo Indications:    NSTEMI I21.4  History:  Patient has prior history of Echocardiogram examinations, most                 recent 03/20/2018. CHF, CAD and Acute MI, Prior CABG and                 Pacemaker, Arrythmias:Complete heart block and LBBB; Risk                 Factors:Hypertension, Diabetes and Dyslipidemia. Severe fatigue                 and dyspnea. History of Afib.  Sonographer:    Darlina Sicilian RDCS Referring Phys: [id number] [place]  1. Left ventricular ejection fraction, by estimation, is 50 to 55%. The left ventricle has low normal function. The left ventricle demonstrates regional wall motion abnormalities (see scoring diagram/findings for description). There is mild left ventricular hypertrophy. Left ventricular diastolic parameters are consistent with Grade I diastolic dysfunction (impaired relaxation).  2. Right ventricular systolic function is mildly reduced. The right ventricular size is normal. There is normal pulmonary artery systolic pressure. The estimated right ventricular systolic pressure is 94.5 mmHg.  3. The mitral valve is normal in structure. Mild mitral valve regurgitation. No evidence of mitral stenosis.  4. The aortic valve is normal in structure. Aortic valve regurgitation is mild to moderate. No aortic stenosis is present.  5. The inferior vena cava is normal in size with greater than 50% respiratory variability, suggesting right atrial pressure of 3 mmHg. FINDINGS  Left Ventricle: Left ventricular ejection fraction, by estimation, is 50 to 55%. The left ventricle has low normal function. The left ventricle demonstrates regional wall motion abnormalities. The left ventricular internal cavity size was normal in size. There is mild left  ventricular hypertrophy. Abnormal (paradoxical) septal motion, consistent with left bundle branch block. Left ventricular diastolic parameters are consistent with Grade I diastolic dysfunction (impaired relaxation).  LV Wall Scoring: The antero-lateral wall and posterior wall are hypokinetic. Right Ventricle: The right ventricular size is normal. No increase in right ventricular wall thickness. Right ventricular systolic function is mildly reduced. There is normal pulmonary artery systolic pressure. The tricuspid regurgitant velocity is 2.41 m/s, and with an assumed right atrial pressure of 3 mmHg, the estimated right ventricular systolic pressure is 03.8 mmHg. Left Atrium: Left atrial size was normal in size. Right Atrium: Right atrial size was normal in size. Pericardium: There is no evidence of pericardial effusion. Presence of pericardial fat pad. Mitral Valve: The mitral valve is normal in structure. Normal mobility of the mitral valve leaflets. Mild mitral annular calcification. Mild mitral valve regurgitation. No evidence of mitral valve stenosis. Tricuspid Valve: The tricuspid valve is normal in structure. Tricuspid valve regurgitation is mild . No evidence of tricuspid stenosis. Aortic Valve: Vena contracta of AR 0.4 cm. The aortic valve is normal in structure. Aortic valve regurgitation is mild to moderate. Aortic regurgitation PHT measures 592 msec. No aortic stenosis is present. Pulmonic Valve: The pulmonic valve was not well visualized. Pulmonic valve regurgitation is not visualized. No evidence of pulmonic stenosis. Aorta: The aortic root is normal in size and structure. Venous: The inferior vena cava is normal in size with greater than 50% respiratory variability, suggesting right atrial pressure of 3 mmHg. IAS/Shunts: The interatrial septum was not well visualized.  LEFT VENTRICLE PLAX 2D LVIDd:         5.80 cm      Diastology LVIDs:  2.80 cm      LV e' lateral:   2.70 cm/s LV PW:         1.20  cm      LV E/e' lateral: 19.7 LV IVS:        1.40 cm      LV e' medial:    3.15 cm/s LVOT diam:     1.85 cm      LV E/e' medial:  16.9 LV SV:         48 LV SV Index:   27 LVOT Area:     2.69 cm  LV Volumes (MOD) LV vol d, MOD A2C: 98.1 ml LV vol d, MOD A4C: 113.0 ml LV vol s, MOD A2C: 42.5 ml LV vol s, MOD A4C: 66.7 ml LV SV MOD A2C:     55.6 ml LV SV MOD A4C:     113.0 ml LV SV MOD BP:      53.8 ml RIGHT VENTRICLE RV S prime:     9.90 cm/s TAPSE (M-mode): 1.4 cm LEFT ATRIUM             Index       RIGHT ATRIUM           Index LA diam:        3.30 cm 1.87 cm/m  RA Area:     11.10 cm LA Vol (A2C):   53.6 ml 30.32 ml/m RA Volume:   20.50 ml  11.60 ml/m LA Vol (A4C):   48.6 ml 27.49 ml/m LA Biplane Vol: 50.8 ml 28.73 ml/m  AORTIC VALVE LVOT Vmax:   89.40 cm/s LVOT Vmean:  53.800 cm/s LVOT VTI:    0.178 m AI PHT:      592 msec  AORTA Ao Root diam: 3.00 cm MITRAL VALVE               TRICUSPID VALVE MV Area (PHT): 2.26 cm    TR Peak grad:   23.2 mmHg MV Decel Time: 335 msec    TR Vmax:        241.00 cm/s MV E velocity: 53.10 cm/s MV A velocity: 81.00 cm/s  SHUNTS MV E/A ratio:  0.66        Systemic VTI:  0.18 m                            Systemic Diam: 1.85 cm Cherlynn Kaiser MD Electronically signed by Cherlynn Kaiser MD Signature Date/Time: 04/28/2020/3:26:33 PM    Final    CUP PACEART REMOTE DEVICE CHECK  Result Date: 04/15/2020 Scheduled remote reviewed. Normal device function.  4 AMS longest 28 mins. On coumadin. Next remote 91 days.     Discharge Exam: Vitals:   04/30/20 0806 04/30/20 0808  BP: (!) 89/77 102/80  Pulse: 64 71  Resp: 16 20  Temp: 98.3 F (36.8 C)   SpO2:  98%   Vitals:   04/29/20 2346 04/30/20 0556 04/30/20 0806 04/30/20 0808  BP: (!) 116/57 122/78 (!) 89/77 102/80  Pulse:   64 71  Resp:   16 20  Temp: 98 F (36.7 C) 98 F (36.7 C) 98.3 F (36.8 C)   TempSrc: Oral Oral Oral   SpO2:    98%  Weight:      Height:        General: Pt is alert, awake, not in acute  distress Cardiovascular: RRR, S1/S2 +, no rubs, no gallops Respiratory: CTA bilaterally,  no wheezing, no rhonchi Abdominal: Soft, NT, ND, bowel sounds + Extremities: no edema, no cyanosis    The results of significant diagnostics from this hospitalization (including imaging, microbiology, ancillary and laboratory) are listed below for reference.     Microbiology: Recent Results (from the past 240 hour(s))  SARS Coronavirus 2 by RT PCR (hospital order, performed in [facility] hospital lab) Nasopharyngeal Nasopharyngeal Swab     Status: None   Collection Time: 04/28/20  3:21 AM   Specimen: Nasopharyngeal Swab  Result Value Ref Range Status   SARS Coronavirus 2 NEGATIVE NEGATIVE Final    Comment: (NOTE) SARS-CoV-2 target nucleic acids are NOT DETECTED.  The SARS-CoV-2 RNA is generally detectable in upper and lower respiratory specimens during the acute phase of infection. The lowest concentration of SARS-CoV-2 viral copies this assay can detect is 250 copies / mL. A negative result does not preclude SARS-CoV-2 infection and should not be used as the sole basis for treatment or other patient management decisions.  A negative result may occur with improper specimen collection / handling, submission of specimen other than nasopharyngeal swab, presence of viral mutation(s) within the areas targeted by this assay, and inadequate number of viral copies (<250 copies / mL). A negative result must be combined with clinical observations, patient history, and epidemiological information.  Fact Sheet for Patients:   StrictlyIdeas.no  Fact Sheet for Healthcare Providers: BankingDealers.co.za  This test is not yet approved or  cleared by the [place] FDA and has been authorized for detection and/or diagnosis of SARS-CoV-2 by FDA under an Emergency Use Authorization (EUA).  This EUA will remain in effect (meaning this test can be used) for  the duration of the COVID-19 declaration under Section 564(b)(1) of the Act, 21 U.S.C. section 360bbb-3(b)(1), unless the authorization is terminated or revoked sooner.  Performed at [facility], [place] [address], [place], [place] [address]      Labs: BNP (last 3 results) No results for input(s): BNP in the last 8760 hours. Basic Metabolic Panel: Recent Labs  Lab 04/27/20 1705 04/28/20 0525 04/29/20 0546 04/30/20 0358  NA 140 140 137 140  K 3.6 3.5 3.4* 3.4*  CL 104 104 103 104  CO2 _0 GLUCOSE 112* 89 153* 122*  BUN _1 CREATININE 1.20* 1.11* 1.07* 1.18*  CALCIUM 8.6* 8.5* 8.3* 8.6*  MG  --  2.2  --   --    Liver Function Tests: Recent Labs  Lab 04/28/20 0525 04/29/20 0546  AST 44* 40  ALT 26 26  ALKPHOS 60 61  BILITOT 0.6 0.7  PROT 6.7 6.3*  ALBUMIN 3.4* 3.1*   No results for input(s): LIPASE, AMYLASE in the last 168 hours. No results for input(s): AMMONIA in the last 168 hours. CBC: Recent Labs  Lab 04/27/20 1705 04/28/20 0728 04/29/20 0546 04/30/20 0358  WBC 6.2  --  6.6 6.9  HGB 11.7* 11.7* 11.1* 11.2*  HCT 36.1 35.3* 33.7* 34.1*  MCV 101.1*  --  98.8 98.8  PLT 168  --  157 150   Cardiac Enzymes: No results for input(s): CKTOTAL, CKMB, CKMBINDEX, TROPONINI in the last 168 hours. BNP: Invalid input(s): POCBNP CBG: Recent Labs  Lab 04/29/20 0549 04/29/20 1236 04/29/20 1620 04/29/20 2345 04/30/20 0554  GLUCAP 128*  128* 86 106* 106* 84   D-Dimer No results for input(s): DDIMER in the last 72 hours. Hgb A1c Recent Labs    04/28/20 0528  HGBA1C  5.7*   Lipid Profile Recent Labs    04/28/20 0525  CHOL 166  HDL 57  LDLCALC 92  TRIG 84  CHOLHDL 2.9   Thyroid function studies Recent Labs    04/28/20 0526  TSH 7.443*   Anemia work up No results for input(s): VITAMINB12, FOLATE, FERRITIN, TIBC, IRON, RETICCTPCT in the last 72 hours. Urinalysis    Component Value Date/Time   COLORURINE YELLOW  06/10/2014 1805   APPEARANCEUR CLEAR 06/10/2014 1805   LABSPEC 1.015 06/10/2014 1805   PHURINE 5.5 06/10/2014 1805   GLUCOSEU NEGATIVE 06/10/2014 1805   HGBUR NEGATIVE 06/10/2014 1805   BILIRUBINUR NEGATIVE 06/10/2014 1805   KETONESUR NEGATIVE 06/10/2014 1805   PROTEINUR NEGATIVE 06/10/2014 1805   UROBILINOGEN 0.2 06/10/2014 1805   NITRITE NEGATIVE 06/10/2014 1805   LEUKOCYTESUR TRACE (A) 06/10/2014 1805   Sepsis Labs Invalid input(s): PROCALCITONIN,  WBC,  LACTICIDVEN Microbiology Recent Results (from the past 240 hour(s))  SARS Coronavirus 2 by RT PCR (hospital order, performed in [place] hospital lab) Nasopharyngeal Nasopharyngeal Swab     Status: None   Collection Time: 04/28/20  3:21 AM   Specimen: Nasopharyngeal Swab  Result Value Ref Range Status   SARS Coronavirus 2 NEGATIVE NEGATIVE Final    Comment: (NOTE) SARS-CoV-2 target nucleic acids are NOT DETECTED.  The SARS-CoV-2 RNA is generally detectable in upper and lower respiratory specimens during the acute phase of infection. The lowest concentration of SARS-CoV-2 viral copies this assay can detect is 250 copies / mL. A negative result does not preclude SARS-CoV-2 infection and should not be used as the sole basis for treatment or other patient management decisions.  A negative result may occur with improper specimen collection / handling, submission of specimen other than nasopharyngeal swab, presence of viral mutation(s) within the areas targeted by this assay, and inadequate number of viral copies (<250 copies / mL). A negative result must be combined with clinical observations, patient history, and epidemiological information.  Fact Sheet for Patients:   StrictlyIdeas.no  Fact Sheet for Healthcare Providers: BankingDealers.co.za  This test is not yet approved or  cleared by the [place] FDA and has been authorized for detection and/or diagnosis of  SARS-CoV-2 by FDA under an Emergency Use Authorization (EUA).  This EUA will remain in effect (meaning this test can be used) for the duration of the COVID-19 declaration under Section 564(b)(1) of the Act, 21 U.S.C. section 360bbb-3(b)(1), unless the authorization is terminated or revoked sooner.  Performed at [facility], [place] [address] [place], [place] [address]      Time coordinating discharge: Over 30 minutes  SIGNED:   Darliss Cheney, MD  [facility] 04/30/2020, 11:59 AM  If 7PM-7AM, please contact night-coverage [email or web address]

## 2020-04-30 NOTE — Discharge Instructions (Signed)
De-escalation of Triple Therapy Post-PCI  Underwent cardiac catheterization with placement of drug-eluting stent on 04/28/2020. Plan for triple therapy with aspirin 81 mg and clopidogrel (Plavix) in addition to oral anticoagulation using warfarin (Coumadin). Plan to discontinue aspirin on 05/28/2020.     Information on my medicine - Coumadin   (Warfarin)  This medication education was reviewed with me or my healthcare representative as part of my discharge preparation.    Why was Coumadin prescribed for you? Coumadin was prescribed for you because you have a blood clot or a medical condition that can cause an increased risk of forming blood clots. Blood clots can cause serious health problems by blocking the flow of blood to the heart, lung, or brain. Coumadin can prevent harmful blood clots from forming. As a reminder your indication for Coumadin is:   Select from menu  What test will check on my response to Coumadin? While on Coumadin (warfarin) you will need to have an INR test regularly to ensure that your dose is keeping you in the desired range. The INR (international normalized ratio) number is calculated from the result of the laboratory test called prothrombin time (PT).  If an INR APPOINTMENT HAS NOT ALREADY BEEN MADE FOR YOU please schedule an appointment to have this lab work done by your health care provider within 7 days. Your INR goal is  a number between:  2 to 3  What  do you need to  know  About  COUMADIN? Take Coumadin (warfarin) exactly as prescribed by your healthcare provider about the same time each day.  DO NOT stop taking without talking to the doctor who prescribed the medication.  Stopping without other blood clot prevention medication to take the place of Coumadin may increase your risk of developing a new clot or stroke.  Get refills before you run out.  What do you do if you miss a dose? If you miss a dose, take it as soon as you remember on the same day then continue  your regularly scheduled regimen the next day.  Do not take two doses of Coumadin at the same time.  Important Safety Information A possible side effect of Coumadin (Warfarin) is an increased risk of bleeding. You should call your healthcare provider right away if you experience any of the following: ? Bleeding from an injury or your nose that does not stop. ? Unusual colored urine (red or dark brown) or unusual colored stools (red or black). ? Unusual bruising for unknown reasons. ? A serious fall or if you hit your head (even if there is no bleeding).  Some foods or medicines interact with Coumadin (warfarin) and might alter your response to warfarin. To help avoid this: ? Eat a balanced diet, maintaining a consistent amount of Vitamin K. ? Notify your provider about major diet changes you plan to make. ? Avoid alcohol or limit your intake to 1 drink for women and 2 drinks for men per day. (1 drink is 5 oz. wine, 12 oz. beer, or 1.5 oz. liquor.)  Make sure that ANY health care provider who prescribes medication for you knows that you are taking Coumadin (warfarin).  Also make sure the healthcare provider who is monitoring your Coumadin knows when you have started a new medication including herbals and non-prescription products.  Coumadin (Warfarin)  Major Drug Interactions  Increased Warfarin Effect Decreased Warfarin Effect  Alcohol (large quantities) Antibiotics (esp. Septra/Bactrim, Flagyl, Cipro) Amiodarone (Cordarone) Aspirin (ASA) Cimetidine (Tagamet) Megestrol (Megace) NSAIDs (ibuprofen,  naproxen, etc.) Piroxicam (Feldene) Propafenone (Rythmol SR) Propranolol (Inderal) Isoniazid (INH) Posaconazole (Noxafil) Barbiturates (Phenobarbital) Carbamazepine (Tegretol) Chlordiazepoxide (Librium) Cholestyramine (Questran) Griseofulvin Oral Contraceptives Rifampin Sucralfate (Carafate) Vitamin K   Coumadin (Warfarin) Major Herbal Interactions  Increased Warfarin Effect  Decreased Warfarin Effect  Garlic Ginseng Ginkgo biloba Coenzyme Q10 Green tea St. Johns wort    Coumadin (Warfarin) FOOD Interactions  Eat a consistent number of servings per week of foods HIGH in Vitamin K (1 serving =  cup)  Collards (cooked, or boiled & drained) Kale (cooked, or boiled & drained) Mustard greens (cooked, or boiled & drained) Parsley *serving size only =  cup Spinach (cooked, or boiled & drained) Swiss chard (cooked, or boiled & drained) Turnip greens (cooked, or boiled & drained)  Eat a consistent number of servings per week of foods MEDIUM-HIGH in Vitamin K (1 serving = 1 cup)  Asparagus (cooked, or boiled & drained) Broccoli (cooked, boiled & drained, or raw & chopped) Brussel sprouts (cooked, or boiled & drained) *serving size only =  cup Lettuce, raw (green leaf, endive, romaine) Spinach, raw Turnip greens, raw & chopped   These websites have more information on Coumadin (warfarin):  FailFactory.se; VeganReport.com.au;

## 2020-04-30 NOTE — TOC Initial Note (Signed)
Transition of Care [facility]) - Initial/Assessment Note    Patient Details  Name: Christine Gay MRN: [id number] Date of Birth: Jun 18, 1941  [facility] CM/SW Contact:    Bethena Roys, RN Phone Number: 04/30/2020, 1:14 PM  Clinical Narrative: Patient presented for NSTEMI- prior to arrival patient was from home with support with daughter.  Patient has durable medical equipment in the home. Physical Therapy recommendations for home health PT. Medicare.gov list provided to the patient and daughter. Patient and daughter chose [facility] for home health. Referral sent to [facility] with [facility] and start of care to begin within 24-48 hours post transition home. [facility] to contact the patient and family. No further needs from Case Manager at this time.               Expected Discharge Plan: [place] Barriers to Discharge: No Barriers Identified   Patient Goals and CMS Choice Patient states their goals for this hospitalization and ongoing recovery are:: to return home with support of daughter. CMS Medicare.gov Compare Post Acute Care list provided to:: Patient Choice offered to / list presented to : Patient  Expected Discharge Plan and Services Expected Discharge Plan: [place] In-house Referral: NA Discharge Planning Services: CM Consult Post Acute Care Choice: [place] arrangements for the past 2 months: Apartment Expected Discharge Date: 04/30/20               DME Arranged: N/A         HH Arranged: PT HH Agency: [facility] Date [place]: 04/30/20 Time HH Agency Contacted: 54 Representative spoke with at [place]: Tommi Rumps  Prior Living Arrangements/Services Living arrangements for the past 2 months: [place] with:: Adult Children Patient language and need for interpreter reviewed:: Yes Do you feel safe going back to the place where you live?: Yes      Need for Family Participation in  Patient Care: Yes (Comment) Care giver support system in place?: Yes (comment) Current home services: DME (Patient has cane, [place] and toilet riser along with rollator.) Criminal Activity/Legal Involvement Pertinent to Current Situation/Hospitalization: No - Comment as needed  Activities of Daily Living      Permission Sought/Granted Permission sought to share information with : Family Supports, Customer service manager, Case Optician, dispensing granted to share information with : Yes, Verbal Permission Granted     Permission granted to share info w AGENCY: [facility]        Emotional Assessment Appearance:: Appears stated age Attitude/Demeanor/Rapport: Engaged Affect (typically observed): Appropriate Orientation: : Oriented to Situation, Oriented to  Time, Oriented to Place, Oriented to Self Alcohol / Substance Use: Not Applicable Psych Involvement: No (comment)  Admission diagnosis:  NSTEMI (non-ST elevated myocardial infarction) [facility]) [I21.4] Patient Active Problem List   Diagnosis Date Noted  . Mixed diabetic hyperlipidemia associated with type 2 diabetes mellitus ([place]) 04/28/2020  . Pain due to onychomycosis of toenails of both feet 03/28/2019  . Coagulation disorder ([place]) 03/28/2019  . Chronic anticoagulation 03/23/2018  . Pacemaker 03/23/2018  . Abnormal nuclear cardiac imaging test   . NSTEMI (non-ST elevated myocardial infarction) ([place]) 03/18/2018  . Shoulder pain, left 03/09/2017  . Seizure ([place])   . Chest pain 03/08/2017  . Muscle spasm 03/08/2017  . Hypocalcemia 03/08/2017  . Encounter for therapeutic drug monitoring 02/08/2017  . AF (paroxysmal atrial fibrillation) ([place]) 02/08/2017  . Complete heart block ([place]) 09/28/2016  . Third degree heart block ([place])   .  Unstable angina ([place])   . CAD (coronary artery disease) 11/11/2014  . Dizziness 07/29/2014  . GI bleed 06/12/2014  . GERD (gastroesophageal reflux disease) 06/11/2014  . Type 2 diabetes mellitus  without complication, without long-term current use of insulin ([place]) 06/11/2014  . Glaucoma 06/11/2014  . Microcytic hypochromic anemia 06/10/2014  . Chronic diastolic CHF (congestive heart failure) ([place]) 08/09/2013  . Hypotension 05/18/2013  . Exertional dyspnea 03/15/2013  . CHF (congestive heart failure) ([place]) 03/15/2013  . MURMUR 03/02/2010  . Hypothyroidism 03/20/2009  . HYPERCHOLESTEROLEMIA  IIA 03/20/2009  . HYPOKALEMIA 03/20/2009  . HYPERTENSION, BENIGN 03/20/2009  . CAD, ARTERY BYPASS GRAFT 03/20/2009   PCP:  Seward Carol, MD Pharmacy:   [facility], [place] [address] Phone: [phone] Fax: [phone]   Readmission Risk Interventions No flowsheet data found.

## 2020-04-30 NOTE — Progress Notes (Signed)
ANTICOAGULATION CONSULT NOTE - Follow Up Consult  Pharmacy Consult for Warfarin Indication: Atrial Fibrillation  Allergies  Allergen Reactions  . Meperidine Hcl Other (See Comments)    Demerol - mental status changes    . Nitroglycerin Other (See Comments)    Patch and Cream only - blisters  . Morphine Palpitations    Patient Measurements: Total Body Weight: 79.8 kg Height: 60 inches Heparin Dosing Weight: 65 kg  Vital Signs: Temp: 98.3 F (36.8 C) (08/04 0806) Temp Source: Oral (08/04 0806) BP: 102/80 (08/04 0808) Pulse Rate: 71 (08/04 0808)  Labs: Recent Labs    04/27/20 1705 04/27/20 1705 04/28/20 0104 04/28/20 0525 04/28/20 0529 04/28/20 0728 04/28/20 0728 04/28/20 1051 04/28/20 1753 04/29/20 0546 04/30/20 0358  HGB 11.7*   < >  --   --   --  11.7*   < >  --   --  11.1* 11.2*  HCT 36.1   < >  --   --   --  35.3*  --   --   --  33.7* 34.1*  PLT 168  --   --   --   --   --   --   --   --  157 150  APTT  --   --   --   --  29  --   --   --   --   --   --   LABPROT  --   --   --   --  18.0*  --   --   --   --  17.3* 18.4*  INR  --   --   --   --  1.6*  --   --   --   --  1.5* 1.6*  CREATININE 1.20*  --   --  1.11*  --   --   --   --   --  1.07* 1.18*  TROPONINIHS 62*   < >   < >  --   --  2,363*   < > 2,273* 3,629*  --  2,059*   < > = values in this interval not displayed.    Estimated Creatinine Clearance: 35.9 mL/min (A) (by C-G formula based on SCr of 1.18 mg/dL (H)).   Medical History: Past Medical History:  Diagnosis Date  . Anemia    a. Noted on labs 04/2013.  . Anginal pain ([place])   . Arthritis    "legs" (06/10/2014)  . CAD (coronary artery disease)    a. s/p CABG 1992, b. s/p PCI several years ago; c. LHC 11/08: L-LAD ok, S-OM1 ok, S-OM2 CTO, prox to mid CFX 70% (unchanged), RCA non-dominant, occluded;  d.  Echo 2/11: EF 55%, mod LVH, Ao sclerosis;  e. Echo 6/14: mild LVH, mild focal basal sept hypertrophy, EF 55-65%, inf HK, Mild AI, mild MR, mild  BAE;  f. Myoview 7/14: int risk-> cath 04/2013 s/p PTCA/DES to prox Cx 05/04/13.  . Chronic diastolic CHF (congestive heart failure) ([place])   . GERD (gastroesophageal reflux disease)   . Glaucoma   . High cholesterol   . Hypertension   . Hypokalemia 02/2017  . Hypothyroidism   . LBBB (left bundle branch block)    chronic  . Myocardial infarction ([place]) 03/1991 X 2; 07/1991  . Obesity   . Seizures ([place])    "I'm on Dilantin" (06/10/2014)  . Sickle cell trait ([place])   . Type II diabetes mellitus ([place])     Assessment:  Pharmacy was consulted to dose heparin for 79 yr old female admitted with chest pain and elevated troponins; pt has hx of atrial fibrillation for which she takes warfarin PTA (INR subtherapeutic at 1.6 on admission). Admission H/H 11.7/36.1, platelets 168.  Pt is S/P cardiac cath 8/2 plan for asax30d - stop date left in DC instructions, and at least 30m clopidogrel Pharmacy is consulted to resume warfarin  Pt's home warfarin regimen: 5 mg PO daily on MWF 7.5 mg PO daily on TTTS (per med rec, pt took last dose at 04/27/20 at 1700; INR subtherapeutic on admission; INR was therapeutic at 2.1 at 03/20/20 coag clinic visit)  INR 1.6 this morning after being restarted post cath. CBC stable. No bleeding noted.    Goal of Therapy:  INR: 2.0-3.0 Monitor platelets by anticoagulation protocol: Yes   Plan:  Warfarin 7.5 mg PO X 1 this evening - repeat Monitor daily INR  Bonnita Nasuti Pharm.D. CPP, BCPS Clinical Pharmacist [phone] 04/30/2020 11:33 AM

## 2020-04-30 NOTE — Progress Notes (Signed)
CARDIAC REHAB PHASE I   PRE:  Rate/Rhythm: 61 pacing with occasional PVC    BP: sitting 102/80    SaO2: 100 RA  MODE:  Ambulation: 470 ft   POST:  Rate/Rhythm: 82 pacing with PVCs    BP: sitting 127/87     SaO2: 97 RA  Pt able to get out of bed and ambulate with rollator. Donned gait belt but no real assist needed. Slow and steady walk in hall. Had her rest x2 as telemetry alarmed entire walk however appeared to be artifact. Only noted PVCs. Pt to recliner, not winded or fatigued like she was yesterday. VSS. Pt feels well, likes to walk. Uses rollator at home. [place], ACSM 04/30/2020 8:58 AM

## 2020-04-30 NOTE — Plan of Care (Signed)
  Problem: Education: Goal: Knowledge of General Education information will improve Description: Including pain rating scale, medication(s)/side effects and non-pharmacologic comfort measures Outcome: Completed/Met   Problem: Health Behavior/Discharge Planning: Goal: Ability to manage health-related needs will improve Outcome: Completed/Met   Problem: Education: Goal: Understanding of CV disease, CV risk reduction, and recovery process will improve Outcome: Completed/Met Goal: Individualized Educational Video(s) Outcome: Completed/Met   Problem: Activity: Goal: Ability to return to baseline activity level will improve Outcome: Completed/Met   Problem: Cardiovascular: Goal: Ability to achieve and maintain adequate cardiovascular perfusion will improve Outcome: Completed/Met Goal: Vascular access site(s) Level 0-1 will be maintained Outcome: Completed/Met

## 2020-04-30 NOTE — Progress Notes (Signed)
Patient ID: Christine Gay, female   DOB: 08-24-1941, 79 y.o.   MRN: [id number]     Advanced Heart Failure Rounding Note  PCP-Cardiologist: [name], MD   Subjective:    Stable s/p PCI to SVG-OM2 this admission.  No chest pain.   She has had some right shoulder pain, this is chronic. Wants to know if she can get a steroid injection today.    Eating, no further abdominal pain.  CT abdomen/pelvis yesterday was concerning for area of ischemic colitis.  Lactate was normal.   Echo showed EF 50-55%, mild LVH, mildly decreased RV systolic function, IVC normal.    Objective:   Weight Range: 78.7 kg Body mass index is 33.9 kg/m.   Vital Signs:   Temp:  [98 F (36.7 C)-99 F (37.2 C)] 98.3 F (36.8 C) (08/04 0806) Pulse Rate:  [60-71] 71 (08/04 0808) Resp:  [16-20] 20 (08/04 0808) BP: (89-130)/(49-116) 102/80 (08/04 0808) SpO2:  [97 %-98 %] 98 % (08/04 0808) Last BM Date: 04/27/20 (per pt)  Weight change: Filed Weights   04/28/20 1756 04/29/20 0442  Weight: 82 kg 78.7 kg    Intake/Output:   Intake/Output Summary (Last 24 hours) at 04/30/2020 0938 Last data filed at 04/30/2020 0800 Gross per 24 hour  Intake 540 ml  Output 1900 ml  Net -1360 ml      Physical Exam    General: NAD Neck: No JVD, no thyromegaly or thyroid nodule.  Lungs: Clear to auscultation bilaterally with normal respiratory effort. CV: Nondisplaced PMI.  Heart regular S1/S2, no S3/S4, no murmur.  No peripheral edema.   Abdomen: Soft, nontender, no hepatosplenomegaly, no distention.  Skin: Intact without lesions or rashes.  Neurologic: Alert and oriented x 3.  Psych: Normal affect. Extremities: No clubbing or cyanosis.  HEENT: Normal.    Telemetry   NSR with v-pacing (personally reviewed)  Labs    CBC Recent Labs    04/29/20 0546 04/30/20 0358  WBC 6.6 6.9  HGB 11.1* 11.2*  HCT 33.7* 34.1*  MCV 98.8 98.8  PLT 157 761   Basic Metabolic Panel Recent Labs    04/28/20 0525  04/28/20 0525 04/29/20 0546 04/30/20 0358  NA 140   < > 137 140  K 3.5   < > 3.4* 3.4*  CL 104   < > 103 104  CO2 27   < > 25 24  GLUCOSE 89   < > 153* 122*  BUN 14   < > 12 10  CREATININE 1.11*   < > 1.07* 1.18*  CALCIUM 8.5*   < > 8.3* 8.6*  MG 2.2  --   --   --    < > = values in this interval not displayed.   Liver Function Tests Recent Labs    04/28/20 0525 04/29/20 0546  AST 44* 40  ALT 26 26  ALKPHOS 60 61  BILITOT 0.6 0.7  PROT 6.7 6.3*  ALBUMIN 3.4* 3.1*   No results for input(s): LIPASE, AMYLASE in the last 72 hours. Cardiac Enzymes No results for input(s): CKTOTAL, CKMB, CKMBINDEX, TROPONINI in the last 72 hours.  BNP: BNP (last 3 results) No results for input(s): BNP in the last 8760 hours.  ProBNP (last 3 results) No results for input(s): PROBNP in the last 8760 hours.   D-Dimer No results for input(s): DDIMER in the last 72 hours. Hemoglobin A1C Recent Labs    04/28/20 0528  HGBA1C 5.7*   Fasting Lipid Panel Recent Labs  04/28/20 0525  CHOL 166  HDL 57  LDLCALC 92  TRIG 84  CHOLHDL 2.9   Thyroid Function Tests Recent Labs    04/28/20 0526  TSH 7.443*    Other results:   Imaging    No results found.   Medications:     Scheduled Medications: . aspirin  81 mg Oral Daily  . carvedilol  3.125 mg Oral BID WC  . clopidogrel  75 mg Oral Q breakfast  . cycloSPORINE  1 drop Both Eyes BID  . diclofenac Sodium  2 g Topical TID AC & HS  . ezetimibe  10 mg Oral QPM  . furosemide  60 mg Oral BID  . insulin aspart  0-15 Units Subcutaneous Q6H  . isosorbide mononitrate  60 mg Oral BID  . levothyroxine  100 mcg Oral QAC breakfast  . pantoprazole (PROTONIX) IV  40 mg Intravenous Daily  . phenytoin  200 mg Oral Daily   And  . phenytoin  100 mg Oral QHS  . potassium chloride  40 mEq Oral Daily  . rosuvastatin  40 mg Oral QPM  . sodium chloride flush  3 mL Intravenous Once  . sodium chloride flush  3 mL Intravenous Q12H  .  timolol  1 drop Both Eyes BID  . Warfarin - Pharmacist Dosing Inpatient   Does not apply q1600    Infusions: . sodium chloride      PRN Medications: sodium chloride, acetaminophen, alum & mag hydroxide-simeth, nitroGLYCERIN, ondansetron (ZOFRAN) IV, sodium chloride flush   Assessment/Plan   1. NSTEMI: H/o CABG remotely.  Cath with tight stenosis ostial SVG-OM2, now s/p PCI.  - ASA x 1 month - Plavix x 6 months minimum - Resumed warfarin, aim for INR 2-2.5 while on Plavix.  - Continue Zetia/Crestor 40.  LDL still elevated, would refer to lipid clinic for PCSK9 inhibitor as outpatient.  - Cardiac rehab.  - Continue Coreg 3.125 mg bid.  2. Chronic diastolic CHF: Echo stable this admission with EF 50-55%.  - Resume home Lasix 60 mg bid and add back KCl 40 daily.  3. Atrial fibrillation: Paroxysmal.  She is in NSR.  4. Abdominal pain: CT abdomen/pelvis showed possible area of ischemic colitis at splenic flexure.  Lactate normal.   She was seen by GI and ok'd for cath and possible intervention. Pain now resolved and she is eating.  - Would not add any medications that would lower BP.  5. Right shoulder pain: Chronic, wants to know if she can have steroid injection in hospital.  Will defer to primary team.   She is stable from a cardiac perspective for discharge.  Cardiac meds for home: Warfarin per coumadin clinic (INR 2-2.5 while on Plavix), Plavix 75 daily, ASA 81 daily x 1 month only, Crestor 40 daily, Zetia 10 daily, Coreg 3.125 mg bid, KCl 40 daily, Lasix 60 mg bid, Imdur 60 bid.  Followup CHF clinic and coumadin clinic.   Length of Stay: 2  [name], MD  04/30/2020, 9:38 AM  Advanced Heart Failure Team Pager [phone] (M-F; 7a - 4p)  Please contact [facility] for night-coverage after hours (4p -7a ) and weekends on [email or web address]

## 2020-05-01 ENCOUNTER — Ambulatory Visit (INDEPENDENT_AMBULATORY_CARE_PROVIDER_SITE_OTHER): Payer: Medicare Other | Admitting: Pharmacist

## 2020-05-01 ENCOUNTER — Other Ambulatory Visit: Payer: Self-pay

## 2020-05-01 DIAGNOSIS — I4891 Unspecified atrial fibrillation: Secondary | ICD-10-CM | POA: Diagnosis not present

## 2020-05-01 DIAGNOSIS — Z5181 Encounter for therapeutic drug level monitoring: Secondary | ICD-10-CM

## 2020-05-01 DIAGNOSIS — I480 Paroxysmal atrial fibrillation: Secondary | ICD-10-CM | POA: Diagnosis not present

## 2020-05-01 LAB — POCT INR: INR: 1.5 — AB (ref 2.0–3.0)

## 2020-05-01 NOTE — Patient Instructions (Signed)
Take 2 tablets today then start taking 1.5 tablets daily except 1 tablet on Mondays and Wednesdays. Recheck in 1 weeks. Coumadin Clinic [phone] Main [phone]

## 2020-05-05 ENCOUNTER — Telehealth: Payer: Self-pay | Admitting: Pharmacist

## 2020-05-05 ENCOUNTER — Other Ambulatory Visit (HOSPITAL_COMMUNITY): Payer: Self-pay | Admitting: Cardiology

## 2020-05-05 NOTE — Telephone Encounter (Signed)
Called [facility] to see if they could check pt INR. Patient is only set up with physical therapy. They only have a few PT who are signed off to check INR and the one going out this week is not. I called Daughter to see if she can still bring pt on Thursday to appointment. Per Gaspar Skeeters she can bring pt to appointment at coumadin clinic on Thursday at 1:15

## 2020-05-08 ENCOUNTER — Other Ambulatory Visit: Payer: Self-pay

## 2020-05-08 ENCOUNTER — Ambulatory Visit (INDEPENDENT_AMBULATORY_CARE_PROVIDER_SITE_OTHER): Payer: Medicare Other | Admitting: *Deleted

## 2020-05-08 DIAGNOSIS — I4891 Unspecified atrial fibrillation: Secondary | ICD-10-CM

## 2020-05-08 DIAGNOSIS — Z5181 Encounter for therapeutic drug level monitoring: Secondary | ICD-10-CM | POA: Diagnosis not present

## 2020-05-08 DIAGNOSIS — I480 Paroxysmal atrial fibrillation: Secondary | ICD-10-CM

## 2020-05-08 LAB — POCT INR: INR: 1.5 — AB (ref 2.0–3.0)

## 2020-05-08 NOTE — Patient Instructions (Signed)
Description   Take 2 tablets today and tomorrow then start taking 1.5 tablets daily. Recheck in 1 week. Coumadin Clinic [phone] Main [phone].

## 2020-05-09 ENCOUNTER — Other Ambulatory Visit (HOSPITAL_COMMUNITY): Payer: Self-pay | Admitting: Cardiology

## 2020-05-12 ENCOUNTER — Ambulatory Visit (HOSPITAL_COMMUNITY)
Admit: 2020-05-12 | Discharge: 2020-05-12 | Disposition: A | Discharge status: Home / Self Care | Payer: Medicare Other | Source: Ambulatory Visit | Attending: Internal Medicine | Admitting: Internal Medicine

## 2020-05-12 ENCOUNTER — Other Ambulatory Visit: Payer: Self-pay

## 2020-05-12 ENCOUNTER — Encounter (HOSPITAL_COMMUNITY): Payer: Self-pay

## 2020-05-12 VITALS — BP 110/68 | HR 60 | Wt 170.5 lb

## 2020-05-12 DIAGNOSIS — K219 Gastro-esophageal reflux disease without esophagitis: Secondary | ICD-10-CM | POA: Insufficient documentation

## 2020-05-12 DIAGNOSIS — I2510 Atherosclerotic heart disease of native coronary artery without angina pectoris: Secondary | ICD-10-CM | POA: Diagnosis not present

## 2020-05-12 DIAGNOSIS — Z955 Presence of coronary angioplasty implant and graft: Secondary | ICD-10-CM | POA: Diagnosis not present

## 2020-05-12 DIAGNOSIS — I5032 Chronic diastolic (congestive) heart failure: Secondary | ICD-10-CM

## 2020-05-12 DIAGNOSIS — H409 Unspecified glaucoma: Secondary | ICD-10-CM | POA: Diagnosis not present

## 2020-05-12 DIAGNOSIS — E1169 Type 2 diabetes mellitus with other specified complication: Secondary | ICD-10-CM | POA: Diagnosis not present

## 2020-05-12 DIAGNOSIS — Z7901 Long term (current) use of anticoagulants: Secondary | ICD-10-CM | POA: Insufficient documentation

## 2020-05-12 DIAGNOSIS — Z951 Presence of aortocoronary bypass graft: Secondary | ICD-10-CM | POA: Diagnosis not present

## 2020-05-12 DIAGNOSIS — I480 Paroxysmal atrial fibrillation: Secondary | ICD-10-CM | POA: Diagnosis not present

## 2020-05-12 DIAGNOSIS — Z7984 Long term (current) use of oral hypoglycemic drugs: Secondary | ICD-10-CM | POA: Diagnosis not present

## 2020-05-12 DIAGNOSIS — I110 Hypertensive heart disease with heart failure: Secondary | ICD-10-CM | POA: Diagnosis not present

## 2020-05-12 DIAGNOSIS — I252 Old myocardial infarction: Secondary | ICD-10-CM | POA: Diagnosis not present

## 2020-05-12 DIAGNOSIS — Z79899 Other long term (current) drug therapy: Secondary | ICD-10-CM | POA: Diagnosis not present

## 2020-05-12 DIAGNOSIS — Z9861 Coronary angioplasty status: Secondary | ICD-10-CM | POA: Diagnosis not present

## 2020-05-12 DIAGNOSIS — Z7902 Long term (current) use of antithrombotics/antiplatelets: Secondary | ICD-10-CM | POA: Diagnosis not present

## 2020-05-12 DIAGNOSIS — Z7982 Long term (current) use of aspirin: Secondary | ICD-10-CM | POA: Insufficient documentation

## 2020-05-12 DIAGNOSIS — E782 Mixed hyperlipidemia: Secondary | ICD-10-CM

## 2020-05-12 DIAGNOSIS — E039 Hypothyroidism, unspecified: Secondary | ICD-10-CM | POA: Insufficient documentation

## 2020-05-12 NOTE — Progress Notes (Signed)
Patient ID: Christine Gay, female   DOB: 04/30/1941, 79 y.o.   MRN: [id number] PCP: Dr. Delfina Redwood Cardiology: Dr. Aundra Dubin  79 y.o. with history of CAD s/p CABG, DM, and HTN presents for followup of CHF and CAD.  She had a Lexiscan Cardiolite in 2014 that was an intermediate risk study and echo showed basal to mid inferior hypokinesis with EF 55-60%.  Dr. Aundra Dubin did a cardaic catheterization in 8/14 that showed patent LIMA-LAD, occluded SVG-OM1, and 95% ostial LCx stenosis.  She had PCI to ostial LCx with Promus DES.  Cardiolite in 5/15 showed no ischemia or infarction.  She had an upper GI bleed in 9/15, Plavix was discontinued.   In 1/18, she was admitted with severe fatigue and dyspnea and was found to be in complete heart block.  Troponin was up to 4.9.   She had LHC showing patent LIMA-LAD and SVG-OM1, patent LCx stent, and occluded SVG-OM2 (known from past), no evidence for plaque rupture/new disease. Echo showed EF stable at 50-55%.  She had St Jude PPM placed.   She was started on warfarin due to paroxysmal atrial fibrillation noted by device interrogation.   She was admitted with chest pain in 6/19, concerning for unstable angina.  LHC showed stable anatomy compared to 1/18 cath. Echo showed EF 50-55%.   Recently readmitted 8/21 for CP and ruled in for NSTEMI. Hs troponin peaked to 3,629. She had also developed severe abdominal pain and CT of abdomin and pelvis showed area of ischemic colitis at splenic flexure.  Lactate normal.   She was seen by GI and ok'd for cath and possible intervention. Cardiac cath showed tight stenosis of ostial SVG-OM2, felt to be culprit lesion, treated w/ PCI + DES. Symptoms resolved. Echo showed normal LVEF, 50-55%. She was placed on triple therapy x 1 month, w/ ASA, Plavix and warfarin. ASA to stop after 30 days. Given elevated LDL, despite being on Crestor 40 + Zetia, she was referred to lipid clinic for PCSK9i therapy (LDL 92).   She presents to clinic today for  post hospital f/u. Here w/ her daughter. Doing well since d/c. Denies any further chest nor abdominal pain. No dyspnea. Has not had to use SL NGT since discharge. Reports full med compliance. Denies abnormal bleeding. BP well controlled. Pulse rate 60 bpm. Had f/u with her PCP this past Friday and had blood work done and was told labs were normal.     Labs (3/13): K 4.2, creatinine 0.77 Labs (6/14): LDL 80, HDL 69 Labs (8/14): BNP 971 => 420 => 138, creatinine 0.8, K 4.3 Labs (11/14): K 4.1, creatinine 0.8, BNP 88 Labs (3/15): K 3.4, creatinine 0.86, LDL 85 Labs (9/15): K 3.2, creatinine 0.63, hgb 8 Labs (1/16): LDL 96 Labs (2/16): K 4.2, creatinine 0.89 Labs (3/16): LDL 91, HDL 56 Labs (7/16): TSH normal, K 4.7, creatinine 0.9, hgb 11.4, LDL 93 Labs (8/17): LDL 83, HDL 62 Labs (1/18): K 4, creatinine 1.09, hgb 12.3 Labs (2/18): K 4.2, creatinine 0.81, BNP 163 Labs (3/18): K 3.9, creatinine 0.96 Labs (6/18): K 3.9, creatinine 0.86, hgb 11.2 Labs (6/19): K 3.7, creatinine 0.88, LDL 63, HDL 37 Labs (8/19): K 4.6, creatinine 0.85, hgb 11.5 Labs (7/20): K 5.2, creatinine 0.85, hgb 11.7  PMH: 1. GERD 2. Seizure disorder 3. Type II diabetes 4. Chronic LBBB 5. CAD: s/p CABG in 1992, had PCI several years ago.  Echo (2/11) with EF 55%, moderate LVH, aortic sclerosis.  LHC (8/14) with patent LIMA-LAD,  totally occluded SVG-OM1, 95% ostial LCx, totally occluded small nondominant RCA with EF 55%.  Patient had Promus DES to pLCx. Lexiscan Cardiolite (5/15) with EF 57%, mild breast attenuation, no ischemia or infarction.  Lexiscan Cardiolite (2/16) with EF 44%, small partially reversible mid anterior perfusion defect may be due to soft tissue attenuation.   - LHC (1/18): patent LIMA-LAD, totally occluded LAD, patent SVG-OM1, totally occluded OM2 and SVG-OM2, patent stent in LCx, totally occluded nondominant RCA with right->right collaterals.  - LHC (6/19): Same as 1/18. 6. Glaucoma 7.  Hypothyroidism 8. HTN 9. Cholecystectomy 10. Diastolic CHF: Echo (1/94) with EF 55-60%, basal to mid inferior hypokinesis, mild MR and mild AI. Echo (2/16) with EF 55-60%, mild LAE. - Echo (1/18): EF 50-55%, mild LVH, mildly decreased RV systolic function, mild AI.   - Echo (6/19): EF 50-55%, basal inferolateral HK, mild MR, mild AI.  11. Carotid stenosis: Carotid dopplers (6/15) with 40-59% bilateral ICA stenosis.  - Carotid dopplers (1/74) with 08-14% LICA stenosis.  - Carotid dopplers (5/18): Mild disease bilaterally.  - Carotid dopplers (5/19): Mild disease bilaterally 12. GI bleed 9/15: EGD showed a polypoid gastric lesion that was friable, benign pathology.  13. Complete heart block: St Jude PPM in 1/18.  14. Atrial fibrillation: Paroxysmal.   SH: Widow, lives in [place], does not smoke.   FH: No premature CAD.  Diabetes.   ROS: All systems reviewed and negative except as per HPI.   Current Outpatient Medications  Medication Sig Dispense Refill  . Accu-Chek Softclix Lancets lancets     . acetaminophen (TYLENOL) 325 MG tablet Take 1-2 tablets (325-650 mg total) by mouth every 4 (four) hours as needed for mild pain.    Marland Kitchen aspirin 81 MG chewable tablet Chew 1 tablet (81 mg total) by mouth daily. 30 tablet 0  . Blood Glucose Monitoring Suppl (ACCU-CHEK AVIVA PLUS) w/Device KIT     . CALCIUM PO Take 1 tablet by mouth daily.    . carvedilol (COREG) 6.25 MG tablet TAKE 1/2 TABLET BY MOUTH 2 TIMES A DAY WITH A MEAL. 90 tablet 4  . clopidogrel (PLAVIX) 75 MG tablet Take 1 tablet (75 mg total) by mouth daily with breakfast. 30 tablet 0  . cycloSPORINE (RESTASIS) 0.05 % ophthalmic emulsion Place 1 drop into both eyes 2 (two) times daily.    Marland Kitchen ezetimibe (ZETIA) 10 MG tablet TAKE 1 TABLET BY MOUTH DAILY. 90 tablet 3  . ferrous sulfate 325 (65 FE) MG tablet Take 325 mg by mouth daily with breakfast.    . furosemide (LASIX) 40 MG tablet TAKE 1 AND 1/2 TABLETS BY MOUTH 2 TIMES DAILY. 180  tablet 2  . glucose blood test strip Test blood glucose three times daily. 100 each 0  . ibandronate (BONIVA) 150 MG tablet Take 150 mg by mouth every 30 (thirty) days. Take in the morning with a full glass of water, on an empty stomach, and do not take anything else by mouth or lie down for the next 30 min.    . isosorbide mononitrate (IMDUR) 60 MG 24 hr tablet Take 1 tablet (60 mg total) by mouth 2 (two) times daily. 180 tablet 1  . levothyroxine (SYNTHROID, LEVOTHROID) 100 MCG tablet Take 100 mcg by mouth daily before breakfast.    . metFORMIN (GLUCOPHAGE-XR) 500 MG 24 hr tablet Take 500 mg by mouth 2 (two) times daily.     . Multiple Vitamin (MULTIVITAMIN WITH MINERALS) TABS tablet Take 1 tablet by mouth daily.    Marland Kitchen  nitroGLYCERIN (NITROSTAT) 0.4 MG SL tablet PLACE 1 TABLET UNDER THE TONGUE EVERY 5 MINUTES AS NEEDED FOR CHEST PAIN. 25 tablet 3  . pantoprazole (PROTONIX) 40 MG tablet TAKE 1 TABLET BY MOUTH DAILY. 90 tablet 2  . phenytoin (DILANTIN) 100 MG ER capsule Take 100-200 mg by mouth See admin instructions. 200 mg in the morning and 100 mg in the evening    . potassium chloride SA (KLOR-CON) 20 MEQ tablet TAKE 1 TABLET BY MOUTH 2 TIMES DAILY. 180 tablet 1  . rosuvastatin (CRESTOR) 40 MG tablet Take 1 tablet (40 mg total) by mouth daily. 90 tablet 3  . timolol (TIMOPTIC) 0.5 % ophthalmic solution Place 1 drop into both eyes 2 (two) times daily.   6  . warfarin (COUMADIN) 5 MG tablet Take 1-1.5 tablets daily or as directed by Anticoagulation Clinic. (Patient taking differently: Take 5-7.5 mg by mouth See admin instructions. Take 1 tablet (77m) by mouth once daily on Mon, Wed, Fri, and 1 1/2 tablet (7.568m all other days Tues, Thurs, Sat, and Sun.) 135 tablet 1   No current facility-administered medications for this encounter.    BP 110/68   Pulse 60   Wt 77.3 kg (170 lb 8 oz)   SpO2 97%   BMI 33.30 kg/m  PHYSICAL EXAM: General:  Well appearing. No respiratory difficulty HEENT:  normal Neck: supple. no JVD. Carotids 2+ bilat; no bruits. No lymphadenopathy or thyromegaly appreciated. Cor: PMI nondisplaced. Regular rate & rhythm. No rubs, gallops or murmurs. Lungs: clear Abdomen: soft, nontender, nondistended. No hepatosplenomegaly. No bruits or masses. Good bowel sounds. Extremities: no cyanosis, clubbing, rash, edema Neuro: alert & oriented x 3, cranial nerves grossly intact. moves all 4 extremities w/o difficulty. Affect pleasant.   Assessment/Plan: 1. CAD: s/p remote CABG. Status post Promus DES to ostial LCx in 8/14. Repeat cath in 6/19 showed stable anatomy. Recent admit 8/21 for NSTEMI, LHC showed tight stenosis of ostial SVG-OM2, felt to be culprit lesion, treated w/ PCI + DES. Stable. No recurrent anginal symptomotogy  - continue on triple therapy w/ ASA+ Plavix + coumadin (afib) x 30 days. Will stop ASA after 9/2 - Continue Imdur and Coreg at current doses.  - She is not a ranolazine candidate given use of Dilantin.  - Will need PCSK9i to get LDL < 70 (not at goal on Crestor 40 + Zetia) 2. Diastolic CHF: EF 5077-93%n 8/21 echo.  NYHA class II. Euvolemic on exam. - Continue Lasix 60 mg bid. Request copy of recent post hospital BMP from PCP, Dr. PoDelfina Redwood3. Hyperlipidemia: She is on Zetia and Crestor 40, however recent LDL elevated at 92 mg/dL.  - refer to lipid clinic for PCSK9i therapy, LDL goal < 70 mg/dL.  4. Carotid bruit: Mild disease on carotid dopplers in 5/19.  - asymptomatic  - continue Plavix + lipid lowering therapy per above  5. H/o GI bleeding: denies melena or hematochezia on triple therapy.  - request copy of recent CBC from PCP (checked 8/13) 6. Complete heart block: S/p St Jude PPM, device functioning appropriately.  7. Paroxysmal atrial fibrillation: RRR on exam today. HR controlled, 60.   - Continue Coreg 6.25 mg bid - Continue warfarin. [facility] w/ Dr. McEsmeralda LinksPA-C  05/12/2020

## 2020-05-12 NOTE — Patient Instructions (Signed)
STOP Aspirin on 05/29/2020.  You have been referred to the lipid clinic they will contact you to schedule your appointment.    Follow up with Dr.McLean in 3 months

## 2020-05-14 ENCOUNTER — Other Ambulatory Visit: Payer: Self-pay

## 2020-05-14 ENCOUNTER — Ambulatory Visit (INDEPENDENT_AMBULATORY_CARE_PROVIDER_SITE_OTHER): Payer: Medicare Other | Admitting: Podiatry

## 2020-05-14 ENCOUNTER — Encounter: Payer: Self-pay | Admitting: Podiatry

## 2020-05-14 DIAGNOSIS — M79675 Pain in left toe(s): Secondary | ICD-10-CM

## 2020-05-14 DIAGNOSIS — D689 Coagulation defect, unspecified: Secondary | ICD-10-CM

## 2020-05-14 DIAGNOSIS — M79674 Pain in right toe(s): Secondary | ICD-10-CM | POA: Diagnosis not present

## 2020-05-14 DIAGNOSIS — B351 Tinea unguium: Secondary | ICD-10-CM | POA: Diagnosis not present

## 2020-05-14 DIAGNOSIS — M2042 Other hammer toe(s) (acquired), left foot: Secondary | ICD-10-CM

## 2020-05-14 DIAGNOSIS — M2041 Other hammer toe(s) (acquired), right foot: Secondary | ICD-10-CM

## 2020-05-14 NOTE — Progress Notes (Signed)
This patient returns to my office for at risk foot care.  This patient requires this care by a professional since this patient will be at risk due to having coagulation defect, diabetes.  Patient is taking coumadin.  This patient is unable to cut nails herself since the patient cannot reach hiernails.These nails are painful walking and wearing shoes.  This patient presents for at risk foot care today.  General Appearance  Alert, conversant and in no acute stress.  Vascular  Dorsalis pedis and posterior tibial  pulses are palpable  bilaterally.  Capillary return is within normal limits  bilaterally. Temperature is within normal limits  bilaterally.  Neurologic  Senn-Weinstein monofilament wire test diminished   bilaterally. Muscle power within normal limits bilaterally.  Nails Thick disfigured discolored nails with subungual debris  from hallux to fifth toes bilaterally. No evidence of bacterial infection or drainage bilaterally.  Orthopedic  No limitations of motion  feet .  No crepitus or effusions noted.  HAV 1st MPJ .  Hammer toes 2  B/L.  Skin  normotropic skin with no porokeratosis noted bilaterally.  No signs of infections or ulcers noted.   Asymptomatic callus sub 5th met  B/L  Onychomycosis  Pain in right toes  Pain in left toes  Consent was obtained for treatment procedures.   Mechanical debridement of nails 1-5  bilaterally performed with a nail nipper.  Filed with dremel without incident.    Return office visit     12 weeks                 Told patient to return for periodic foot care and evaluation due to potential at risk complications.   Gregory Mayer DPM  

## 2020-05-15 ENCOUNTER — Encounter (HOSPITAL_COMMUNITY): Payer: Self-pay

## 2020-05-15 ENCOUNTER — Telehealth (HOSPITAL_COMMUNITY): Payer: Self-pay

## 2020-05-15 NOTE — Telephone Encounter (Signed)
Attempted to call patient in regards to Cardiac Rehab - LM on VM Mailed letter 

## 2020-05-15 NOTE — Telephone Encounter (Signed)
Pt insurance is active and benefits verified through [facility] Medicare. Co-pay $0.00, DED $0.00/$0.00 met, out of pocket $7,550.00/$0.00 met, co-insurance 20%. No pre-authorization required. Passport, 05/05/20 @ 10:44AM, LVX#[id number]  Pt insurance is active through Medicaid. EBB#[id number]  Will fax over Medicaid Reimbursement form to Dr. Aundra Dubin  Will contact patient to see if she is interested in the Cardiac Rehab Program.

## 2020-05-16 ENCOUNTER — Other Ambulatory Visit: Payer: Self-pay

## 2020-05-16 ENCOUNTER — Ambulatory Visit (INDEPENDENT_AMBULATORY_CARE_PROVIDER_SITE_OTHER): Payer: Medicare Other | Admitting: *Deleted

## 2020-05-16 DIAGNOSIS — I4891 Unspecified atrial fibrillation: Secondary | ICD-10-CM | POA: Diagnosis not present

## 2020-05-16 DIAGNOSIS — I480 Paroxysmal atrial fibrillation: Secondary | ICD-10-CM

## 2020-05-16 DIAGNOSIS — Z5181 Encounter for therapeutic drug level monitoring: Secondary | ICD-10-CM

## 2020-05-16 LAB — POCT INR: INR: 1.8 — AB (ref 2.0–3.0)

## 2020-05-16 NOTE — Patient Instructions (Signed)
Description   Take 2 tablets today then start taking 1.5 tablets daily except 2 tablets on Tuesdays. Recheck in 11 days Coumadin Clinic [phone] Main [phone].

## 2020-05-26 ENCOUNTER — Other Ambulatory Visit (HOSPITAL_COMMUNITY): Payer: Self-pay | Admitting: Cardiology

## 2020-05-27 ENCOUNTER — Other Ambulatory Visit: Payer: Self-pay

## 2020-05-27 ENCOUNTER — Ambulatory Visit (INDEPENDENT_AMBULATORY_CARE_PROVIDER_SITE_OTHER): Payer: Medicare Other

## 2020-05-27 DIAGNOSIS — I480 Paroxysmal atrial fibrillation: Secondary | ICD-10-CM

## 2020-05-27 DIAGNOSIS — I4891 Unspecified atrial fibrillation: Secondary | ICD-10-CM | POA: Diagnosis not present

## 2020-05-27 DIAGNOSIS — Z5181 Encounter for therapeutic drug level monitoring: Secondary | ICD-10-CM

## 2020-05-27 LAB — POCT INR: INR: 2.3 (ref 2.0–3.0)

## 2020-05-27 NOTE — Patient Instructions (Signed)
Description   Continue on same dosage 1.5 tablets daily except 2 tablets on Tuesdays. Recheck in 3 weeks.  Coumadin Clinic [phone] Main [phone].

## 2020-05-28 ENCOUNTER — Telehealth (HOSPITAL_COMMUNITY): Payer: Self-pay

## 2020-05-28 NOTE — Telephone Encounter (Signed)
No response from pt regarding CR.  Closed referral.  

## 2020-05-29 ENCOUNTER — Telehealth (HOSPITAL_COMMUNITY): Payer: Self-pay

## 2020-05-29 MED ORDER — ROSUVASTATIN CALCIUM 40 MG PO TABS: 40.0000 mg | ORAL_TABLET | Freq: Every day | ORAL | 3 refills | Status: AC

## 2020-05-29 MED ORDER — CLOPIDOGREL BISULFATE 75 MG PO TABS: 75.0000 mg | ORAL_TABLET | Freq: Every day | ORAL | 3 refills | Status: AC

## 2020-05-29 NOTE — Telephone Encounter (Signed)
Meds ordered this encounter  Medications  . clopidogrel (PLAVIX) 75 MG tablet    Sig: Take 1 tablet (75 mg total) by mouth daily with breakfast.    Dispense:  90 tablet    Refill:  3  . rosuvastatin (CRESTOR) 40 MG tablet    Sig: Take 1 tablet (40 mg total) by mouth daily.    Dispense:  90 tablet    Refill:  3

## 2020-06-05 ENCOUNTER — Other Ambulatory Visit: Payer: Self-pay | Admitting: Cardiology

## 2020-06-06 ENCOUNTER — Other Ambulatory Visit: Payer: Self-pay

## 2020-06-06 ENCOUNTER — Ambulatory Visit: Payer: Medicare Other | Admitting: Orthotics

## 2020-06-06 DIAGNOSIS — M2022 Hallux rigidus, left foot: Secondary | ICD-10-CM | POA: Diagnosis not present

## 2020-06-06 DIAGNOSIS — M2041 Other hammer toe(s) (acquired), right foot: Secondary | ICD-10-CM | POA: Diagnosis not present

## 2020-06-06 DIAGNOSIS — M2042 Other hammer toe(s) (acquired), left foot: Secondary | ICD-10-CM | POA: Diagnosis not present

## 2020-06-06 DIAGNOSIS — E119 Type 2 diabetes mellitus without complications: Secondary | ICD-10-CM | POA: Diagnosis not present

## 2020-06-06 DIAGNOSIS — M2021 Hallux rigidus, right foot: Secondary | ICD-10-CM | POA: Diagnosis not present

## 2020-06-12 ENCOUNTER — Other Ambulatory Visit: Payer: Self-pay

## 2020-06-12 ENCOUNTER — Ambulatory Visit (INDEPENDENT_AMBULATORY_CARE_PROVIDER_SITE_OTHER): Payer: Medicare Other | Admitting: Pharmacist

## 2020-06-12 DIAGNOSIS — I2583 Coronary atherosclerosis due to lipid rich plaque: Secondary | ICD-10-CM

## 2020-06-12 DIAGNOSIS — E7800 Pure hypercholesterolemia, unspecified: Secondary | ICD-10-CM | POA: Diagnosis not present

## 2020-06-12 DIAGNOSIS — I2510 Atherosclerotic heart disease of native coronary artery without angina pectoris: Secondary | ICD-10-CM

## 2020-06-12 NOTE — Progress Notes (Signed)
Patient ID: Christine Gay                 DOB: 02-06-1941                    MRN: [id number]     HPI: Christine Gay is a 79 y.o. female patient referred to lipid clinic by Lyda Jester. PMH is significant for CAD, CHF, A fib, NSTEMI, T2DM, seizures, and HLD.  Recently had 4th stent placed despite being on rosuvastatin 20m and zetia 174m     Recently readmitted 8/21 for CP and ruled in for NSTEMI. Hs troponin peaked to 3,629. She had also developed severe abdominal pain and CT of abdomin and pelvis showed area of ischemic colitis at splenic flexure. Lactate normal. She was seen by GI and ok'd for cath and possible intervention. Cardiac cath showed tight stenosis of ostial SVG-OM2, felt to be culprit lesion, treated w/ PCI + DES. Symptoms resolved. Echo showed normal LVEF, 50-55%. She was placed on triple therapy x 1 month, w/ ASA, Plavix and warfarin. ASA to stop after 30 days. Given elevated LDL, despite being on Crestor 40 + Zetia, she was referred to lipid clinic for PCSK9i therapy (LDL 92).   Current Medications: rosuvastatin 40, zetia 10 Intolerances: n/a Risk Factors: CHF, HTN, CAD, a fib, hx of MI, T2DM LDL goal: <55  Diet: predominantly vegetables and lean meats.  Does not add salt to food.  Exercise: Uses walker  Family History: Father had history of MI  Labs:  TC 166, HDL 57, LDL 92, Trigs 84 (04/28/20 - on rosuvastatin and zetia)  Past Medical History:  Diagnosis Date  . Anemia    a. Noted on labs 04/2013.  . Anginal pain (HCLec[place]  . Arthritis    "legs" (06/10/2014)  . CAD (coronary artery disease)    a. s/p CABG 1992, b. s/p PCI several years ago; c. LHC 11/08: L-LAD ok, S-OM1 ok, S-OM2 CTO, prox to mid CFX 70% (unchanged), RCA non-dominant, occluded;  d.  Echo 2/11: EF 55%, mod LVH, Ao sclerosis;  e. Echo 6/14: mild LVH, mild focal basal sept hypertrophy, EF 55-65%, inf HK, Mild AI, mild MR, mild BAE;  f. Myoview 7/14: int risk-> cath 04/2013 s/p PTCA/DES to prox Cx  05/04/13.  . Chronic diastolic CHF (congestive heart failure) (HCMilton  . GERD (gastroesophageal reflux disease)   . Glaucoma   . High cholesterol   . Hypertension   . Hypokalemia 02/2017  . Hypothyroidism   . LBBB (left bundle branch block)    chronic  . Myocardial infarction ([place]CE[place]7/1992 X 2; 07/1991  . Obesity   . Seizures (HCO[place]   "I'm on Dilantin" (06/10/2014)  . Sickle cell trait (HCMcLean  . Type II diabetes mellitus (HCVirgil    Current Outpatient Medications on File Prior to Visit  Medication Sig Dispense Refill  . Accu-Chek Softclix Lancets lancets     . acetaminophen (TYLENOL) 325 MG tablet Take 1-2 tablets (325-650 mg total) by mouth every 4 (four) hours as needed for mild pain.    . Blood Glucose Monitoring Suppl (ACCU-CHEK AVIVA PLUS) w/Device KIT     . CALCIUM PO Take 1 tablet by mouth daily.    . carvedilol (COREG) 6.25 MG tablet TAKE 1/2 TABLET BY MOUTH 2 TIMES A DAY WITH A MEAL. 90 tablet 4  . clopidogrel (PLAVIX) 75 MG tablet Take 1 tablet (75 mg total) by mouth daily with breakfast.  90 tablet 3  . cycloSPORINE (RESTASIS) 0.05 % ophthalmic emulsion Place 1 drop into both eyes 2 (two) times daily.    Marland Kitchen ezetimibe (ZETIA) 10 MG tablet TAKE 1 TABLET BY MOUTH DAILY. 90 tablet 3  . ferrous sulfate 325 (65 FE) MG tablet Take 325 mg by mouth daily with breakfast.    . furosemide (LASIX) 40 MG tablet TAKE 1 AND 1/2 TABLETS BY MOUTH 2 TIMES DAILY. 180 tablet 2  . glucose blood test strip Test blood glucose three times daily. 100 each 0  . ibandronate (BONIVA) 150 MG tablet Take 150 mg by mouth every 30 (thirty) days. Take in the morning with a full glass of water, on an empty stomach, and do not take anything else by mouth or lie down for the next 30 min.    . isosorbide mononitrate (IMDUR) 60 MG 24 hr tablet Take 1 tablet (60 mg total) by mouth 2 (two) times daily. 180 tablet 1  . levothyroxine (SYNTHROID, LEVOTHROID) 100 MCG tablet Take 100 mcg by mouth daily before breakfast.    .  metFORMIN (GLUCOPHAGE-XR) 500 MG 24 hr tablet Take 500 mg by mouth 2 (two) times daily.     . Multiple Vitamin (MULTIVITAMIN WITH MINERALS) TABS tablet Take 1 tablet by mouth daily.    . nitroGLYCERIN (NITROSTAT) 0.4 MG SL tablet PLACE 1 TABLET UNDER THE TONGUE EVERY 5 MINUTES AS NEEDED FOR CHEST PAIN. 25 tablet 3  . pantoprazole (PROTONIX) 40 MG tablet TAKE 1 TABLET BY MOUTH DAILY. 90 tablet 0  . phenytoin (DILANTIN) 100 MG ER capsule Take 100-200 mg by mouth See admin instructions. 200 mg in the morning and 100 mg in the evening    . potassium chloride SA (KLOR-CON) 20 MEQ tablet TAKE 1 TABLET BY MOUTH 2 TIMES DAILY. 180 tablet 1  . rosuvastatin (CRESTOR) 40 MG tablet Take 1 tablet (40 mg total) by mouth daily. 90 tablet 3  . timolol (TIMOPTIC) 0.5 % ophthalmic solution Place 1 drop into both eyes 2 (two) times daily.   6  . warfarin (COUMADIN) 5 MG tablet Take 1-1.5 tablets daily or as directed by Anticoagulation Clinic. (Patient taking differently: Take 5-7.5 mg by mouth See admin instructions. Take 1 tablet (43m) by mouth once daily on Mon, Wed, Fri, and 1 1/2 tablet (7.546m all other days Tues, Thurs, Sat, and Sun.) 135 tablet 1   No current facility-administered medications on file prior to visit.    Allergies  Allergen Reactions  . Meperidine Hcl Other (See Comments)    Demerol - mental status changes    . Nitroglycerin Other (See Comments)    Patch and Cream only - blisters  . Morphine Palpitations    Assessment/Plan:  1. Hyperlipidemia - Patient LDL 92 which is above goal of <55.  Aggressive goal due to patients comorbidities such as hx of NSTEMI, CHF, CAD, and DM.  Patient already on high dose rosuvastatin and zetia.  Would benefit from PCSK9i.  Using demo pen, educated patient on all aspects of PCSK9 including storage, site selection and administration.  Patient was able to demonstrate in room using demo.  Patient voiced understanding.  Will recheck lipid panel in 2-3  months.  ChKarren CobblePharmD, BCACP, [id number]. [address]GrMcClusky[place] [address]hone: [phone]: [phone]/16/2021 3:19 PM

## 2020-06-12 NOTE — Patient Instructions (Signed)
It was great seeing you today!  We are going to start you on a medication you will inject once every 2 weeks.  Remember to mark it on your calendar.  Continue Crestor (rosuvastatin) and Zetia every day  Continue your healthy eating and call us with any questions!  Karren Cobble, PharmD, BCACP, [place] [address], [place], [place] [address] Phone: [phone]; Fax: [phone] 06/12/2020 2:49 PM

## 2020-06-18 ENCOUNTER — Other Ambulatory Visit: Payer: Self-pay

## 2020-06-18 ENCOUNTER — Ambulatory Visit (INDEPENDENT_AMBULATORY_CARE_PROVIDER_SITE_OTHER): Payer: Medicare Other | Admitting: *Deleted

## 2020-06-18 ENCOUNTER — Telehealth: Payer: Self-pay

## 2020-06-18 DIAGNOSIS — I4891 Unspecified atrial fibrillation: Secondary | ICD-10-CM | POA: Diagnosis not present

## 2020-06-18 DIAGNOSIS — Z5181 Encounter for therapeutic drug level monitoring: Secondary | ICD-10-CM | POA: Diagnosis not present

## 2020-06-18 DIAGNOSIS — E782 Mixed hyperlipidemia: Secondary | ICD-10-CM

## 2020-06-18 LAB — POCT INR: INR: 2 (ref 2.0–3.0)

## 2020-06-18 MED ORDER — REPATHA SURECLICK 140 MG/ML ~~LOC~~ SOAJ: 140.0000 mg | SUBCUTANEOUS | 11 refills | Status: AC

## 2020-06-18 NOTE — Patient Instructions (Signed)
Description   Continue on same dosage 1.5 tablets daily except 2 tablets on Tuesdays. Recheck in 4 weeks.  Coumadin Clinic [phone] Main [phone]     

## 2020-06-18 NOTE — Telephone Encounter (Signed)
Called and lmomed the pt to start repatha sureclick, rx sent, pt instructed to call back if unaffordable and to schedule 2 mos post 1st injection lft and lipids orders placed

## 2020-06-30 ENCOUNTER — Other Ambulatory Visit (HOSPITAL_COMMUNITY): Payer: Self-pay | Admitting: Cardiology

## 2020-07-03 ENCOUNTER — Other Ambulatory Visit (HOSPITAL_COMMUNITY): Payer: Self-pay

## 2020-07-03 MED ORDER — POTASSIUM CHLORIDE CRYS ER 20 MEQ PO TBCR: 20.0000 meq | EXTENDED_RELEASE_TABLET | Freq: Two times a day (BID) | ORAL | 3 refills | Status: AC

## 2020-07-14 ENCOUNTER — Ambulatory Visit (INDEPENDENT_AMBULATORY_CARE_PROVIDER_SITE_OTHER): Payer: Medicare Other

## 2020-07-14 DIAGNOSIS — I442 Atrioventricular block, complete: Secondary | ICD-10-CM | POA: Diagnosis not present

## 2020-07-15 LAB — CUP PACEART REMOTE DEVICE CHECK
Battery Remaining Longevity: 124 mo
Battery Remaining Percentage: 95.5 %
Battery Voltage: 3.01 V
Brady Statistic AP VP Percent: 48 %
Brady Statistic AP VS Percent: 1 %
Brady Statistic AS VP Percent: 52 %
Brady Statistic AS VS Percent: 1 %
Brady Statistic RA Percent Paced: 46 %
Brady Statistic RV Percent Paced: 99 %
Date Time Interrogation Session: 20211018025646
Implantable Lead Implant Date: 20180103
Implantable Lead Implant Date: 20180103
Implantable Lead Location: 753859
Implantable Lead Location: 753860
Implantable Pulse Generator Implant Date: 20180103
Lead Channel Impedance Value: 360 Ohm
Lead Channel Impedance Value: 480 Ohm
Lead Channel Pacing Threshold Amplitude: 0.5 V
Lead Channel Pacing Threshold Amplitude: 0.625 V
Lead Channel Pacing Threshold Pulse Width: 0.5 ms
Lead Channel Pacing Threshold Pulse Width: 0.5 ms
Lead Channel Sensing Intrinsic Amplitude: 12 mV
Lead Channel Sensing Intrinsic Amplitude: 3.5 mV
Lead Channel Setting Pacing Amplitude: 0.875
Lead Channel Setting Pacing Amplitude: 1.5 V
Lead Channel Setting Pacing Pulse Width: 0.5 ms
Lead Channel Setting Sensing Sensitivity: 4 mV
Pulse Gen Model: 2272
Pulse Gen Serial Number: 7988263

## 2020-07-21 ENCOUNTER — Ambulatory Visit (INDEPENDENT_AMBULATORY_CARE_PROVIDER_SITE_OTHER): Payer: Medicare Other | Admitting: Pharmacist

## 2020-07-21 ENCOUNTER — Other Ambulatory Visit: Payer: Self-pay

## 2020-07-21 DIAGNOSIS — I480 Paroxysmal atrial fibrillation: Secondary | ICD-10-CM | POA: Diagnosis not present

## 2020-07-21 DIAGNOSIS — Z5181 Encounter for therapeutic drug level monitoring: Secondary | ICD-10-CM | POA: Diagnosis not present

## 2020-07-21 DIAGNOSIS — I4891 Unspecified atrial fibrillation: Secondary | ICD-10-CM

## 2020-07-21 LAB — POCT INR: INR: 1.6 — AB (ref 2.0–3.0)

## 2020-07-21 NOTE — Patient Instructions (Signed)
Take 2 tablets tonight, 2.5 tablets tomorrow then continue on same dosage 1.5 tablets daily except 2 tablets on Tuesdays. Recheck in 2 weeks.  Coumadin Clinic [phone] Main [phone]

## 2020-07-21 NOTE — Progress Notes (Signed)
Remote pacemaker transmission.   

## 2020-08-05 ENCOUNTER — Other Ambulatory Visit: Payer: Self-pay

## 2020-08-05 ENCOUNTER — Ambulatory Visit (INDEPENDENT_AMBULATORY_CARE_PROVIDER_SITE_OTHER): Payer: Medicare Other

## 2020-08-05 DIAGNOSIS — I480 Paroxysmal atrial fibrillation: Secondary | ICD-10-CM | POA: Diagnosis not present

## 2020-08-05 DIAGNOSIS — Z5181 Encounter for therapeutic drug level monitoring: Secondary | ICD-10-CM

## 2020-08-05 DIAGNOSIS — I4891 Unspecified atrial fibrillation: Secondary | ICD-10-CM | POA: Diagnosis not present

## 2020-08-05 LAB — POCT INR: INR: 2.9 (ref 2.0–3.0)

## 2020-08-05 NOTE — Patient Instructions (Signed)
Description   Continue on same dosage 1.5 tablets daily except 2 tablets on Tuesdays. Recheck in 4 weeks.  Coumadin Clinic [phone] Main [phone]

## 2020-08-06 ENCOUNTER — Other Ambulatory Visit (HOSPITAL_COMMUNITY): Payer: Self-pay | Admitting: Cardiology

## 2020-08-12 ENCOUNTER — Encounter (HOSPITAL_COMMUNITY): Payer: Medicare Other | Admitting: Internal Medicine

## 2020-08-13 ENCOUNTER — Ambulatory Visit: Payer: Medicare Other | Admitting: Podiatry

## 2020-08-14 ENCOUNTER — Encounter (HOSPITAL_COMMUNITY): Payer: Medicare Other | Admitting: Cardiology

## 2020-08-19 ENCOUNTER — Telehealth: Payer: Self-pay | Admitting: Pharmacist

## 2020-08-19 NOTE — Telephone Encounter (Signed)
Left message on machine asking patient to schedule lipid panel

## 2020-08-29 ENCOUNTER — Ambulatory Visit (HOSPITAL_COMMUNITY)
Admission: RE | Admit: 2020-08-29 | Discharge: 2020-08-29 | Disposition: A | Discharge status: Home / Self Care | Payer: Medicare Other | Source: Ambulatory Visit | Attending: Cardiology | Admitting: Cardiology

## 2020-08-29 ENCOUNTER — Encounter (HOSPITAL_COMMUNITY): Payer: Self-pay | Admitting: Cardiology

## 2020-08-29 ENCOUNTER — Other Ambulatory Visit: Payer: Self-pay

## 2020-08-29 VITALS — BP 126/66 | HR 60 | Ht 60.0 in | Wt 166.4 lb

## 2020-08-29 DIAGNOSIS — Z951 Presence of aortocoronary bypass graft: Secondary | ICD-10-CM | POA: Diagnosis not present

## 2020-08-29 DIAGNOSIS — I5032 Chronic diastolic (congestive) heart failure: Secondary | ICD-10-CM | POA: Insufficient documentation

## 2020-08-29 DIAGNOSIS — I2583 Coronary atherosclerosis due to lipid rich plaque: Secondary | ICD-10-CM | POA: Diagnosis not present

## 2020-08-29 DIAGNOSIS — Z79899 Other long term (current) drug therapy: Secondary | ICD-10-CM | POA: Diagnosis not present

## 2020-08-29 DIAGNOSIS — I252 Old myocardial infarction: Secondary | ICD-10-CM | POA: Insufficient documentation

## 2020-08-29 DIAGNOSIS — E785 Hyperlipidemia, unspecified: Secondary | ICD-10-CM | POA: Diagnosis not present

## 2020-08-29 DIAGNOSIS — I480 Paroxysmal atrial fibrillation: Secondary | ICD-10-CM | POA: Diagnosis not present

## 2020-08-29 DIAGNOSIS — I110 Hypertensive heart disease with heart failure: Secondary | ICD-10-CM | POA: Diagnosis not present

## 2020-08-29 DIAGNOSIS — Z955 Presence of coronary angioplasty implant and graft: Secondary | ICD-10-CM | POA: Diagnosis not present

## 2020-08-29 DIAGNOSIS — Z7902 Long term (current) use of antithrombotics/antiplatelets: Secondary | ICD-10-CM | POA: Insufficient documentation

## 2020-08-29 DIAGNOSIS — Z950 Presence of cardiac pacemaker: Secondary | ICD-10-CM | POA: Insufficient documentation

## 2020-08-29 DIAGNOSIS — Z7901 Long term (current) use of anticoagulants: Secondary | ICD-10-CM | POA: Diagnosis not present

## 2020-08-29 DIAGNOSIS — I2510 Atherosclerotic heart disease of native coronary artery without angina pectoris: Secondary | ICD-10-CM | POA: Insufficient documentation

## 2020-08-29 LAB — BASIC METABOLIC PANEL
Anion gap: 9 (ref 5–15)
BUN: 14 mg/dL (ref 8–23)
CO2: 25 mmol/L (ref 22–32)
Calcium: 8.2 mg/dL — ABNORMAL LOW (ref 8.9–10.3)
Chloride: 104 mmol/L (ref 98–111)
Creatinine, Ser: 1.13 mg/dL — ABNORMAL HIGH (ref 0.44–1.00)
GFR, Estimated: 49 mL/min — ABNORMAL LOW (ref >60)
Glucose, Bld: 119 mg/dL — ABNORMAL HIGH (ref 70–99)
Potassium: 4.3 mmol/L (ref 3.5–5.1)
Sodium: 138 mmol/L (ref 135–145)

## 2020-08-29 LAB — CBC
HCT: 34.2 % — ABNORMAL LOW (ref 36.0–46.0)
Hemoglobin: 11.4 g/dL — ABNORMAL LOW (ref 12.0–15.0)
MCH: 33.5 pg (ref 26.0–34.0)
MCHC: 33.3 g/dL (ref 30.0–36.0)
MCV: 100.6 fL — ABNORMAL HIGH (ref 80.0–100.0)
Platelets: 184 10*3/uL (ref 150–400)
RBC: 3.4 MIL/uL — ABNORMAL LOW (ref 3.87–5.11)
RDW: 11.8 % (ref 11.5–15.5)
WBC: 5.9 10*3/uL (ref 4.0–10.5)
nRBC: 0 % (ref 0.0–0.2)

## 2020-08-29 LAB — LIPID PANEL
Cholesterol: 98 mg/dL (ref 0–200)
HDL: 50 mg/dL (ref >40)
LDL Cholesterol: 38 mg/dL (ref 0–99)
Total CHOL/HDL Ratio: 2 RATIO
Triglycerides: 48 mg/dL (ref <150)
VLDL: 10 mg/dL (ref 0–40)

## 2020-08-29 NOTE — Patient Instructions (Addendum)
Labs done today. We will contact you only if your labs are abnormal.  No medication changes were made. Please continue all medications as prescribed.  Your physician recommends that you schedule a follow-up appointment in: 4 months. Please contact our office in March 2022 to schedule a May 2022 appointment.   If you have any questions or concerns before your next appointment please send [place] a message through [place] or call our office at [phone].    TO LEAVE A MESSAGE FOR THE NURSE SELECT OPTION 2, PLEASE LEAVE A MESSAGE INCLUDING: . YOUR NAME . DATE OF BIRTH . CALL BACK NUMBER . REASON FOR CALL**this is important as we prioritize the call backs  YOU WILL RECEIVE A CALL BACK THE SAME DAY AS LONG AS YOU CALL BEFORE 4:00 PM   Do the following things EVERYDAY: 1) Weigh yourself in the morning before breakfast. Write it down and keep it in a log. 2) Take your medicines as prescribed 3) Eat low salt foods--Limit salt (sodium) to 2000 mg per day.  4) Stay as active as you can everyday 5) Limit all fluids for the day to less than 2 liters    At the [facility], you and your health needs are our priority. As part of our continuing mission to provide you with exceptional heart care, we have created designated Provider Care Teams. These Care Teams include your primary Cardiologist (physician) and Advanced Practice Providers (APPs- Physician Assistants and Nurse Practitioners) who all work together to provide you with the care you need, when you need it.   You may see any of the following providers on your designated Care Team at your next follow up: Marland Kitchen Dr Glori Bickers . Dr [name] . Darrick Grinder, NP . Lyda Jester, PA . Audry Riles, PharmD   Please be sure to bring in all your medications bottles to every appointment.

## 2020-08-30 NOTE — Progress Notes (Signed)
Patient ID: Christine Gay, female   DOB: 18-Jun-1941, 79 y.o.   MRN: [id number] PCP: Dr. Delfina Redwood Cardiology: Dr. Aundra Dubin  79 y.o. with history of CAD s/p CABG, DM, and HTN presents for followup of CHF and CAD.  She had a Lexiscan Cardiolite in 2014 that was an intermediate risk study and echo showed basal to mid inferior hypokinesis with EF 55-60%.  Dr. Aundra Dubin did a cardaic catheterization in 8/14 that showed patent LIMA-LAD, occluded SVG-OM1, and 95% ostial LCx stenosis.  She had PCI to ostial LCx with Promus DES.  Cardiolite in 5/15 showed no ischemia or infarction.  She had an upper GI bleed in 9/15, Plavix was discontinued.   In 1/18, she was admitted with severe fatigue and dyspnea and was found to be in complete heart block.  Troponin was up to 4.9.   She had LHC showing patent LIMA-LAD and SVG-OM1, patent LCx stent, and occluded SVG-OM2 (known from past), no evidence for plaque rupture/new disease. Echo showed EF stable at 50-55%.  She had St Jude PPM placed.   She was started on warfarin due to paroxysmal atrial fibrillation noted by device interrogation.   She was admitted with chest pain in 6/19, concerning for unstable angina.  LHC showed stable anatomy compared to 1/18 cath. Echo showed EF 50-55%.   Admitted 8/21 for CP and ruled in for NSTEMI. Hs troponin peaked to 3,629. She had also developed severe abdominal pain and CT of abdomin and pelvis showed area of ischemic colitis at splenic flexure.  Lactate normal.   She was seen by GI and ok'd for cath and possible intervention. Cardiac cath showed tight stenosis of ostial SVG-OM2, felt to be culprit lesion, treated w/ DES. Symptoms resolved. Echo showed normal LVEF, 50-55%. She was placed on triple therapy x 1 month, w/ ASA, Plavix and warfarin. ASA to stop after 30 days. Given elevated LDL, despite being on Crestor 40 + Zetia, she was referred to lipid clinic for PCSK9i therapy (LDL 92), now on Repatha.   She returns for followup of CAD.  She  is doing well, no chest pain.  She walks daily for exercise, uses walker for balance.  No significant dyspnea walking with her walker.  No orthopnea/PND.  No lightheadedness.  No BRBPR/melena.  Weight down 4 lbs.   St Jude device interrogation: 99% v-paced, no atrial fibrillation.   Labs (3/13): K 4.2, creatinine 0.77 Labs (6/14): LDL 80, HDL 69 Labs (8/14): BNP 971 => 420 => 138, creatinine 0.8, K 4.3 Labs (11/14): K 4.1, creatinine 0.8, BNP 88 Labs (3/15): K 3.4, creatinine 0.86, LDL 85 Labs (9/15): K 3.2, creatinine 0.63, hgb 8 Labs (1/16): LDL 96 Labs (2/16): K 4.2, creatinine 0.89 Labs (3/16): LDL 91, HDL 56 Labs (7/16): TSH normal, K 4.7, creatinine 0.9, hgb 11.4, LDL 93 Labs (8/17): LDL 83, HDL 62 Labs (1/18): K 4, creatinine 1.09, hgb 12.3 Labs (2/18): K 4.2, creatinine 0.81, BNP 163 Labs (3/18): K 3.9, creatinine 0.96 Labs (6/18): K 3.9, creatinine 0.86, hgb 11.2 Labs (6/19): K 3.7, creatinine 0.88, LDL 63, HDL 37 Labs (8/19): K 4.6, creatinine 0.85, hgb 11.5 Labs (7/20): K 5.2, creatinine 0.85, hgb 11.7 Labs (8/21): K 3.4, creatinine 1.18  PMH: 1. GERD 2. Seizure disorder 3. Type II diabetes 4. Chronic LBBB 5. CAD: s/p CABG in 1992, had PCI several years ago.  Echo (2/11) with EF 55%, moderate LVH, aortic sclerosis.  LHC (8/14) with patent LIMA-LAD, totally occluded SVG-OM1, 95% ostial LCx, totally  occluded small nondominant RCA with EF 55%.  Patient had Promus DES to pLCx. Lexiscan Cardiolite (5/15) with EF 57%, mild breast attenuation, no ischemia or infarction.  Lexiscan Cardiolite (2/16) with EF 44%, small partially reversible mid anterior perfusion defect may be due to soft tissue attenuation.   - LHC (1/18): patent LIMA-LAD, totally occluded LAD, patent SVG-OM1, totally occluded OM2 and SVG-OM2, patent stent in LCx, totally occluded nondominant RCA with right->right collaterals.  - LHC (6/19): Same as 1/18. - NSTEMI 8/21. LHC with patent LIMA-LAD, SVG-OM1 and SVG-RCA  totally occluded, 95% stenosis SVG-OM2 treated with DES.  6. Glaucoma 7. Hypothyroidism 8. HTN 9. Cholecystectomy 10. Diastolic CHF: Echo (5/97) with EF 55-60%, basal to mid inferior hypokinesis, mild MR and mild AI. Echo (2/16) with EF 55-60%, mild LAE. - Echo (1/18): EF 50-55%, mild LVH, mildly decreased RV systolic function, mild AI.   - Echo (6/19): EF 50-55%, basal inferolateral HK, mild MR, mild AI.  - Echo (8/21): EF 50-55%, mildly decreased RV systolic function, mild MR.  11. Carotid stenosis: Carotid dopplers (6/15) with 40-59% bilateral ICA stenosis.  - Carotid dopplers (4/16) with 38-45% LICA stenosis.  - Carotid dopplers (5/18): Mild disease bilaterally.  - Carotid dopplers (5/19): Mild disease bilaterally 12. GI bleed 9/15: EGD showed a polypoid gastric lesion that was friable, benign pathology.  13. Complete heart block: St Jude PPM in 1/18.  14. Atrial fibrillation: Paroxysmal.  15. H/o ischemic colitis.   SH: Widow, lives in [place], does not smoke.   FH: No premature CAD.  Diabetes.   ROS: All systems reviewed and negative except as per HPI.   Current Outpatient Medications  Medication Sig Dispense Refill  . Accu-Chek Softclix Lancets lancets     . acetaminophen (TYLENOL) 325 MG tablet Take 1-2 tablets (325-650 mg total) by mouth every 4 (four) hours as needed for mild pain.    . Blood Glucose Monitoring Suppl (ACCU-CHEK AVIVA PLUS) w/Device KIT     . CALCIUM PO Take 1 tablet by mouth daily.    . carvedilol (COREG) 6.25 MG tablet TAKE 1/2 TABLET BY MOUTH 2 TIMES A DAY WITH A MEAL. 90 tablet 4  . clopidogrel (PLAVIX) 75 MG tablet Take 1 tablet (75 mg total) by mouth daily with breakfast. 90 tablet 3  . cycloSPORINE (RESTASIS) 0.05 % ophthalmic emulsion Place 1 drop into both eyes 2 (two) times daily.    . Evolocumab (REPATHA SURECLICK) 364 MG/ML SOAJ Inject 140 mg into the skin every 14 (fourteen) days. 2 mL 11  . ezetimibe (ZETIA) 10 MG tablet TAKE 1 TABLET BY  MOUTH DAILY. 90 tablet 3  . ferrous sulfate 325 (65 FE) MG tablet Take 325 mg by mouth daily with breakfast.    . furosemide (LASIX) 40 MG tablet TAKE 1 AND 1/2 TABLETS BY MOUTH 2 TIMES DAILY. 180 tablet 2  . glucose blood test strip Test blood glucose three times daily. 100 each 0  . ibandronate (BONIVA) 150 MG tablet Take 150 mg by mouth every 30 (thirty) days. Take in the morning with a full glass of water, on an empty stomach, and do not take anything else by mouth or lie down for the next 30 min.    . isosorbide mononitrate (IMDUR) 60 MG 24 hr tablet Take 1 tablet (60 mg total) by mouth 2 (two) times daily. 180 tablet 1  . levothyroxine (SYNTHROID, LEVOTHROID) 100 MCG tablet Take 100 mcg by mouth daily before breakfast.    . metFORMIN (GLUCOPHAGE-XR)  500 MG 24 hr tablet Take 500 mg by mouth 2 (two) times daily.     . Multiple Vitamin (MULTIVITAMIN WITH MINERALS) TABS tablet Take 1 tablet by mouth daily.    . Multiple Vitamins-Minerals (PRESERVISION AREDS 2 PO) Take by mouth.    . nitroGLYCERIN (NITROSTAT) 0.4 MG SL tablet PLACE 1 TABLET UNDER THE TONGUE EVERY 5 MINUTES AS NEEDED FOR CHEST PAIN. 25 tablet 3  . pantoprazole (PROTONIX) 40 MG tablet TAKE 1 TABLET BY MOUTH DAILY. 90 tablet 0  . phenytoin (DILANTIN) 100 MG ER capsule Take 100-200 mg by mouth See admin instructions. 200 mg in the morning and 100 mg in the evening    . potassium chloride SA (KLOR-CON) 20 MEQ tablet Take 1 tablet (20 mEq total) by mouth 2 (two) times daily. 180 tablet 3  . rosuvastatin (CRESTOR) 40 MG tablet Take 1 tablet (40 mg total) by mouth daily. 90 tablet 3  . timolol (TIMOPTIC) 0.5 % ophthalmic solution Place 1 drop into both eyes 2 (two) times daily.   6  . warfarin (COUMADIN) 5 MG tablet Take 1-1.5 tablets daily or as directed by Anticoagulation Clinic. (Patient taking differently: Take 5-7.5 mg by mouth See admin instructions. Take 1 tablet (71m) by mouth once daily on Mon, Wed, Fri, and 1 1/2 tablet (7.525m  all other days Tues, Thurs, Sat, and Sun.) 135 tablet 1   No current facility-administered medications for this encounter.    BP 126/66   Pulse 60   Ht 5' (1.524 m)   Wt 75.5 kg (166 lb 6.4 oz)   SpO2 99%   BMI 32.50 kg/m  PHYSICAL EXAM: General: NAD Neck: No JVD, no thyromegaly or thyroid nodule.  Lungs: Clear to auscultation bilaterally with normal respiratory effort. CV: Nondisplaced PMI.  Heart regular S1/S2, no S3/S4, no murmur.  No peripheral edema.  No carotid bruit.  Normal pedal pulses.  Abdomen: Soft, nontender, no hepatosplenomegaly, no distention.  Skin: Intact without lesions or rashes.  Neurologic: Alert and oriented x 3.  Psych: Normal affect. Extremities: No clubbing or cyanosis.  HEENT: Normal.   Assessment/Plan: 1. CAD: s/p remote CABG. Status post Promus DES to ostial LCx in 8/14. Repeat cath in 6/19 showed stable anatomy. Admit 8/21 for NSTEMI, LHC showed tight stenosis of ostial SVG-OM2, felt to be culprit lesion, treated w/ DES. Stable, no recurrent angina.  - Continue Plavix x 1 yr if no bleeding. She will remain warfarin.  Now off ASA.  - Continue Imdur and Coreg at current doses.  - She is not a ranolazine candidate given use of Dilantin.  - Continue Crestor, Zetia, Repatha.  2. Diastolic CHF: EF 5039-76%n 8/21 echo.  NYHA class II. Euvolemic on exam. - Continue Lasix 60 mg bid. BMET today.  3. Hyperlipidemia: She is on Zetia, Crestor, and Repatha.  - Lipids today.   4. Carotid bruit: Mild disease on carotid dopplers in 5/19.  - continue Plavix + lipid lowering therapy per above  5. H/o GI bleeding: denies melena or hematochezia on triple therapy.  - Check CBC.  6. Complete heart block: S/p St Jude PPM, device functioning appropriately, she is primarily RV paced.  7. Paroxysmal atrial fibrillation: NSR today.   - Continue Coreg 3.125 mg bid - Continue warfarin. [facility]   Followup in 4 months.   DaLoralie Champagne12/12/2019

## 2020-09-02 ENCOUNTER — Ambulatory Visit (INDEPENDENT_AMBULATORY_CARE_PROVIDER_SITE_OTHER): Payer: Medicare Other

## 2020-09-02 ENCOUNTER — Other Ambulatory Visit: Payer: Self-pay

## 2020-09-02 DIAGNOSIS — Z5181 Encounter for therapeutic drug level monitoring: Secondary | ICD-10-CM

## 2020-09-02 DIAGNOSIS — I480 Paroxysmal atrial fibrillation: Secondary | ICD-10-CM | POA: Diagnosis not present

## 2020-09-02 DIAGNOSIS — I4891 Unspecified atrial fibrillation: Secondary | ICD-10-CM | POA: Diagnosis not present

## 2020-09-02 LAB — POCT INR: INR: 2 (ref 2.0–3.0)

## 2020-09-02 NOTE — Patient Instructions (Signed)
Description   Continue on same dosage 1.5 tablets daily except 2 tablets on Tuesdays. Recheck in 4 weeks.  [facility] [phone] Main [phone]

## 2020-09-03 ENCOUNTER — Ambulatory Visit (INDEPENDENT_AMBULATORY_CARE_PROVIDER_SITE_OTHER): Payer: Medicare Other | Admitting: Podiatry

## 2020-09-03 ENCOUNTER — Encounter: Payer: Self-pay | Admitting: Podiatry

## 2020-09-03 DIAGNOSIS — D689 Coagulation defect, unspecified: Secondary | ICD-10-CM

## 2020-09-03 DIAGNOSIS — M79674 Pain in right toe(s): Secondary | ICD-10-CM

## 2020-09-03 DIAGNOSIS — E1140 Type 2 diabetes mellitus with diabetic neuropathy, unspecified: Secondary | ICD-10-CM | POA: Diagnosis not present

## 2020-09-03 DIAGNOSIS — B351 Tinea unguium: Secondary | ICD-10-CM

## 2020-09-03 DIAGNOSIS — M2041 Other hammer toe(s) (acquired), right foot: Secondary | ICD-10-CM

## 2020-09-03 DIAGNOSIS — M79675 Pain in left toe(s): Secondary | ICD-10-CM

## 2020-09-03 DIAGNOSIS — M2042 Other hammer toe(s) (acquired), left foot: Secondary | ICD-10-CM

## 2020-09-03 NOTE — Progress Notes (Signed)
This patient returns to my office for at risk foot care.  This patient requires this care by a professional since this patient will be at risk due to having coagulation defect, diabetes.  Patient is taking coumadin.  This patient is unable to cut nails herself since the patient cannot reach hiernails.These nails are painful walking and wearing shoes.  This patient presents for at risk foot care today.  General Appearance  Alert, conversant and in no acute stress.  Vascular  Dorsalis pedis and posterior tibial  pulses are palpable  bilaterally.  Capillary return is within normal limits  bilaterally. Temperature is within normal limits  bilaterally.  Neurologic  Senn-Weinstein monofilament wire test diminished   bilaterally. Muscle power within normal limits bilaterally.  Nails Thick disfigured discolored nails with subungual debris  from hallux to fifth toes bilaterally. No evidence of bacterial infection or drainage bilaterally.  Orthopedic  No limitations of motion  feet .  No crepitus or effusions noted.  HAV 1st MPJ .  Hammer toes 2  B/L.  Skin  normotropic skin with no porokeratosis noted bilaterally.  No signs of infections or ulcers noted.   Asymptomatic callus sub 5th met  B/L  Onychomycosis  Pain in right toes  Pain in left toes  Consent was obtained for treatment procedures.   Mechanical debridement of nails 1-5  bilaterally performed with a nail nipper.  Filed with dremel without incident.    Return office visit    3 months                Told patient to return for periodic foot care and evaluation due to potential at risk complications.   Gardiner Barefoot DPM

## 2020-09-30 ENCOUNTER — Ambulatory Visit (INDEPENDENT_AMBULATORY_CARE_PROVIDER_SITE_OTHER): Payer: Medicare Other | Admitting: *Deleted

## 2020-09-30 ENCOUNTER — Other Ambulatory Visit: Payer: Self-pay

## 2020-09-30 DIAGNOSIS — Z5181 Encounter for therapeutic drug level monitoring: Secondary | ICD-10-CM

## 2020-09-30 DIAGNOSIS — I480 Paroxysmal atrial fibrillation: Secondary | ICD-10-CM

## 2020-09-30 DIAGNOSIS — I4891 Unspecified atrial fibrillation: Secondary | ICD-10-CM

## 2020-09-30 LAB — POCT INR: INR: 2.2 (ref 2.0–3.0)

## 2020-09-30 NOTE — Patient Instructions (Signed)
Description   Continue taking Warfarin 1.5 tablets daily except 2 tablets on Tuesdays. Recheck in 5 weeks. [facility] Clinic [phone] Main [phone]

## 2020-10-06 ENCOUNTER — Other Ambulatory Visit: Payer: Self-pay | Admitting: Cardiology

## 2020-10-13 ENCOUNTER — Ambulatory Visit (INDEPENDENT_AMBULATORY_CARE_PROVIDER_SITE_OTHER): Payer: Medicare Other

## 2020-10-13 DIAGNOSIS — I442 Atrioventricular block, complete: Secondary | ICD-10-CM | POA: Diagnosis not present

## 2020-10-14 DIAGNOSIS — I214 Non-ST elevation (NSTEMI) myocardial infarction: Secondary | ICD-10-CM | POA: Diagnosis not present

## 2020-10-14 DIAGNOSIS — E139 Other specified diabetes mellitus without complications: Secondary | ICD-10-CM | POA: Diagnosis not present

## 2020-10-14 DIAGNOSIS — E1169 Type 2 diabetes mellitus with other specified complication: Secondary | ICD-10-CM | POA: Diagnosis not present

## 2020-10-14 DIAGNOSIS — D649 Anemia, unspecified: Secondary | ICD-10-CM | POA: Diagnosis not present

## 2020-10-14 DIAGNOSIS — E7800 Pure hypercholesterolemia, unspecified: Secondary | ICD-10-CM | POA: Diagnosis not present

## 2020-10-14 DIAGNOSIS — H35033 Hypertensive retinopathy, bilateral: Secondary | ICD-10-CM | POA: Diagnosis not present

## 2020-10-14 DIAGNOSIS — I2510 Atherosclerotic heart disease of native coronary artery without angina pectoris: Secondary | ICD-10-CM | POA: Diagnosis not present

## 2020-10-14 DIAGNOSIS — E782 Mixed hyperlipidemia: Secondary | ICD-10-CM | POA: Diagnosis not present

## 2020-10-14 DIAGNOSIS — M8580 Other specified disorders of bone density and structure, unspecified site: Secondary | ICD-10-CM | POA: Diagnosis not present

## 2020-10-14 DIAGNOSIS — I10 Essential (primary) hypertension: Secondary | ICD-10-CM | POA: Diagnosis not present

## 2020-10-14 DIAGNOSIS — M810 Age-related osteoporosis without current pathological fracture: Secondary | ICD-10-CM | POA: Diagnosis not present

## 2020-10-14 DIAGNOSIS — E039 Hypothyroidism, unspecified: Secondary | ICD-10-CM | POA: Diagnosis not present

## 2020-10-15 LAB — CUP PACEART REMOTE DEVICE CHECK
Battery Remaining Longevity: 122 mo
Battery Remaining Percentage: 95.5 %
Battery Voltage: 2.99 V
Brady Statistic AP VP Percent: 47 %
Brady Statistic AP VS Percent: 1 %
Brady Statistic AS VP Percent: 52 %
Brady Statistic AS VS Percent: 1 %
Brady Statistic RA Percent Paced: 45 %
Brady Statistic RV Percent Paced: 99 %
Date Time Interrogation Session: 20220117020021
Implantable Lead Implant Date: 20180103
Implantable Lead Implant Date: 20180103
Implantable Lead Location: 753859
Implantable Lead Location: 753860
Implantable Pulse Generator Implant Date: 20180103
Lead Channel Impedance Value: 360 Ohm
Lead Channel Impedance Value: 490 Ohm
Lead Channel Pacing Threshold Amplitude: 0.5 V
Lead Channel Pacing Threshold Amplitude: 0.625 V
Lead Channel Pacing Threshold Pulse Width: 0.5 ms
Lead Channel Pacing Threshold Pulse Width: 0.5 ms
Lead Channel Sensing Intrinsic Amplitude: 12 mV
Lead Channel Sensing Intrinsic Amplitude: 3.7 mV
Lead Channel Setting Pacing Amplitude: 0.875
Lead Channel Setting Pacing Amplitude: 1.5 V
Lead Channel Setting Pacing Pulse Width: 0.5 ms
Lead Channel Setting Sensing Sensitivity: 4 mV
Pulse Gen Model: 2272
Pulse Gen Serial Number: 7988263

## 2020-10-27 NOTE — Progress Notes (Signed)
Remote pacemaker transmission.   

## 2020-11-25 DEATH — deceased

## 2020-12-05 ENCOUNTER — Ambulatory Visit: Payer: Medicare Other | Admitting: Podiatry
